# Patient Record
Sex: Male | Born: 1945 | Race: White | Hispanic: No | Smoking: Never smoker
Health system: Southern US, Community
[De-identification: ages and names within clinical notes are randomized; demographics above are authoritative.]

## PROBLEM LIST (undated history)

## (undated) DIAGNOSIS — I219 Acute myocardial infarction, unspecified: Secondary | ICD-10-CM

## (undated) DIAGNOSIS — M75102 Unspecified rotator cuff tear or rupture of left shoulder, not specified as traumatic: Secondary | ICD-10-CM

## (undated) DIAGNOSIS — I8393 Asymptomatic varicose veins of bilateral lower extremities: Secondary | ICD-10-CM

## (undated) DIAGNOSIS — R42 Dizziness and giddiness: Secondary | ICD-10-CM

## (undated) DIAGNOSIS — I639 Cerebral infarction, unspecified: Secondary | ICD-10-CM

## (undated) DIAGNOSIS — E669 Obesity, unspecified: Secondary | ICD-10-CM

## (undated) DIAGNOSIS — I10 Essential (primary) hypertension: Secondary | ICD-10-CM

## (undated) DIAGNOSIS — I209 Angina pectoris, unspecified: Secondary | ICD-10-CM

## (undated) DIAGNOSIS — I5022 Chronic systolic (congestive) heart failure: Secondary | ICD-10-CM

## (undated) DIAGNOSIS — I82409 Acute embolism and thrombosis of unspecified deep veins of unspecified lower extremity: Secondary | ICD-10-CM

## (undated) DIAGNOSIS — I255 Ischemic cardiomyopathy: Secondary | ICD-10-CM

## (undated) DIAGNOSIS — I5020 Unspecified systolic (congestive) heart failure: Secondary | ICD-10-CM

## (undated) DIAGNOSIS — I2510 Atherosclerotic heart disease of native coronary artery without angina pectoris: Secondary | ICD-10-CM

## (undated) DIAGNOSIS — C801 Malignant (primary) neoplasm, unspecified: Secondary | ICD-10-CM

## (undated) DIAGNOSIS — R609 Edema, unspecified: Secondary | ICD-10-CM

## (undated) DIAGNOSIS — N400 Enlarged prostate without lower urinary tract symptoms: Secondary | ICD-10-CM

## (undated) DIAGNOSIS — R001 Bradycardia, unspecified: Secondary | ICD-10-CM

## (undated) DIAGNOSIS — C44301 Unspecified malignant neoplasm of skin of nose: Secondary | ICD-10-CM

## (undated) DIAGNOSIS — M51369 Other intervertebral disc degeneration, lumbar region without mention of lumbar back pain or lower extremity pain: Secondary | ICD-10-CM

## (undated) DIAGNOSIS — M1990 Unspecified osteoarthritis, unspecified site: Secondary | ICD-10-CM

## (undated) DIAGNOSIS — I700 Atherosclerosis of aorta: Secondary | ICD-10-CM

## (undated) DIAGNOSIS — I6789 Other cerebrovascular disease: Secondary | ICD-10-CM

## (undated) DIAGNOSIS — K219 Gastro-esophageal reflux disease without esophagitis: Secondary | ICD-10-CM

## (undated) DIAGNOSIS — I679 Cerebrovascular disease, unspecified: Secondary | ICD-10-CM

## (undated) DIAGNOSIS — U071 COVID-19: Secondary | ICD-10-CM

## (undated) DIAGNOSIS — Z7902 Long term (current) use of antithrombotics/antiplatelets: Secondary | ICD-10-CM

## (undated) DIAGNOSIS — I739 Peripheral vascular disease, unspecified: Secondary | ICD-10-CM

## (undated) DIAGNOSIS — R112 Nausea with vomiting, unspecified: Secondary | ICD-10-CM

## (undated) DIAGNOSIS — E785 Hyperlipidemia, unspecified: Secondary | ICD-10-CM

## (undated) HISTORY — DX: Ischemic cardiomyopathy: I25.5

## (undated) HISTORY — DX: Cerebrovascular disease, unspecified: I67.9

## (undated) HISTORY — PX: KNEE SURGERY: SHX244

## (undated) HISTORY — DX: Unspecified systolic (congestive) heart failure: I50.20

## (undated) HISTORY — DX: Chronic systolic (congestive) heart failure: I50.22

## (undated) HISTORY — PX: COLONOSCOPY WITH ESOPHAGOGASTRODUODENOSCOPY (EGD): SHX5779

## (undated) HISTORY — PX: TOTAL HIP ARTHROPLASTY: SHX124

## (undated) HISTORY — PX: SHOULDER ACROMIOPLASTY: SHX6093

## (undated) HISTORY — DX: Dizziness and giddiness: R42

---

## 2004-12-15 ENCOUNTER — Ambulatory Visit: Payer: Self-pay | Admitting: Unknown Physician Specialty

## 2005-10-21 ENCOUNTER — Emergency Department: Payer: Self-pay | Admitting: Emergency Medicine

## 2008-03-30 ENCOUNTER — Emergency Department: Payer: Self-pay | Admitting: Emergency Medicine

## 2008-08-20 ENCOUNTER — Ambulatory Visit: Payer: Self-pay | Admitting: Unknown Physician Specialty

## 2008-10-18 ENCOUNTER — Ambulatory Visit: Payer: Self-pay | Admitting: Sports Medicine

## 2013-10-29 HISTORY — PX: CARDIAC CATHETERIZATION: SHX172

## 2014-08-02 ENCOUNTER — Inpatient Hospital Stay: Payer: Self-pay | Admitting: Internal Medicine

## 2014-08-02 HISTORY — PX: CORONARY ANGIOPLASTY WITH STENT PLACEMENT: SHX49

## 2014-08-02 LAB — COMPREHENSIVE METABOLIC PANEL
Albumin: 4.2 g/dL (ref 3.4–5.0)
Alkaline Phosphatase: 101 U/L
Anion Gap: 6 — ABNORMAL LOW (ref 7–16)
BUN: 18 mg/dL (ref 7–18)
Bilirubin,Total: 1.3 mg/dL — ABNORMAL HIGH (ref 0.2–1.0)
CALCIUM: 9.1 mg/dL (ref 8.5–10.1)
CHLORIDE: 106 mmol/L (ref 98–107)
CO2: 28 mmol/L (ref 21–32)
CREATININE: 1.28 mg/dL (ref 0.60–1.30)
EGFR (African American): >60
EGFR (Non-African Amer.): 60 — ABNORMAL LOW
Glucose: 125 mg/dL — ABNORMAL HIGH (ref 65–99)
Osmolality: 283 (ref 275–301)
POTASSIUM: 3.8 mmol/L (ref 3.5–5.1)
SGOT(AST): 26 U/L (ref 15–37)
SGPT (ALT): 30 U/L
SODIUM: 140 mmol/L (ref 136–145)
TOTAL PROTEIN: 7.9 g/dL (ref 6.4–8.2)

## 2014-08-02 LAB — CBC WITH DIFFERENTIAL/PLATELET
BASOS PCT: 0.3 %
Basophil #: 0 10*3/uL (ref 0.0–0.1)
Eosinophil #: 0.1 10*3/uL (ref 0.0–0.7)
Eosinophil %: 0.7 %
HCT: 52.2 % — ABNORMAL HIGH (ref 40.0–52.0)
HGB: 17.2 g/dL (ref 13.0–18.0)
LYMPHS ABS: 4 10*3/uL — AB (ref 1.0–3.6)
Lymphocyte %: 43.1 %
MCH: 32.4 pg (ref 26.0–34.0)
MCHC: 32.9 g/dL (ref 32.0–36.0)
MCV: 99 fL (ref 80–100)
MONO ABS: 0.5 x10 3/mm (ref 0.2–1.0)
Monocyte %: 4.8 %
Neutrophil #: 4.8 10*3/uL (ref 1.4–6.5)
Neutrophil %: 51.1 %
Platelet: 168 10*3/uL (ref 150–440)
RBC: 5.3 10*6/uL (ref 4.40–5.90)
RDW: 13.5 % (ref 11.5–14.5)
WBC: 9.4 10*3/uL (ref 3.8–10.6)

## 2014-08-02 LAB — TROPONIN I
TROPONIN-I: 0.09 ng/mL — AB
TROPONIN-I: 9.6 ng/mL — AB
Troponin-I: 19 ng/mL — ABNORMAL HIGH

## 2014-08-02 LAB — LIPID PANEL
CHOLESTEROL: 237 mg/dL — AB (ref 0–200)
HDL Cholesterol: 56 mg/dL (ref 40–60)
Ldl Cholesterol, Calc: 142 mg/dL — ABNORMAL HIGH (ref 0–100)
TRIGLYCERIDES: 197 mg/dL (ref 0–200)
VLDL Cholesterol, Calc: 39 mg/dL (ref 5–40)

## 2014-08-02 LAB — CK-MB
CK-MB: 39.2 ng/mL — ABNORMAL HIGH (ref 0.5–3.6)
CK-MB: 42.1 ng/mL — ABNORMAL HIGH (ref 0.5–3.6)

## 2014-08-02 LAB — HEMOGLOBIN A1C: Hemoglobin A1C: 5.4 % (ref 4.2–6.3)

## 2014-08-02 LAB — APTT: ACTIVATED PTT: 28.5 s (ref 23.6–35.9)

## 2014-08-02 LAB — PROTIME-INR
INR: 0.9
Prothrombin Time: 12.2 secs (ref 11.5–14.7)

## 2014-08-02 LAB — CK TOTAL AND CKMB (NOT AT ARMC)
CK, TOTAL: 377 U/L — AB
CK, TOTAL: 476 U/L — AB
CK-MB: 37.8 ng/mL — AB (ref 0.5–3.6)
CK-MB: 48.9 ng/mL — AB (ref 0.5–3.6)

## 2014-08-03 LAB — BASIC METABOLIC PANEL
ANION GAP: 7 (ref 7–16)
BUN: 13 mg/dL (ref 7–18)
CALCIUM: 8.3 mg/dL — AB (ref 8.5–10.1)
CO2: 24 mmol/L (ref 21–32)
Chloride: 111 mmol/L — ABNORMAL HIGH (ref 98–107)
Creatinine: 0.87 mg/dL (ref 0.60–1.30)
EGFR (Non-African Amer.): >60
GLUCOSE: 99 mg/dL (ref 65–99)
Osmolality: 283 (ref 275–301)
Potassium: 3.4 mmol/L — ABNORMAL LOW (ref 3.5–5.1)
SODIUM: 142 mmol/L (ref 136–145)

## 2014-08-03 LAB — TROPONIN I: TROPONIN-I: 8.6 ng/mL — AB

## 2014-08-03 LAB — CK: CK, TOTAL: 298 U/L

## 2014-08-10 DIAGNOSIS — I2119 ST elevation (STEMI) myocardial infarction involving other coronary artery of inferior wall: Secondary | ICD-10-CM | POA: Insufficient documentation

## 2014-08-12 DIAGNOSIS — I2119 ST elevation (STEMI) myocardial infarction involving other coronary artery of inferior wall: Secondary | ICD-10-CM

## 2014-08-12 HISTORY — DX: ST elevation (STEMI) myocardial infarction involving other coronary artery of inferior wall: I21.19

## 2015-02-19 NOTE — Consult Note (Signed)
Chief Complaint:  Subjective/Chief Complaint With patient states to feel reasonably well denies any chest pain no shortness of breath ambulating the halls well without any difficulty. patient went left ago home he says.   VITAL SIGNS/ANCILLARY NOTES: **Vital Signs.:   06-Oct-15 09:32  Vital Signs Type Admission  Temperature Temperature (F) 98.7  Celsius 37  Pulse Pulse 71  Respirations Respirations 18  Systolic BP Systolic BP 299  Diastolic BP (mmHg) Diastolic BP (mmHg) 95  Mean BP 111  Pulse Ox % Pulse Ox % 98  Pulse Ox Activity Level  At rest  Oxygen Delivery Room Air/ 21 %  *Intake and Output.:   06-Oct-15 08:30  Grand Totals Intake:  480 Output:      Net:  480 24 Hr.:  480  Oral Intake      In:  480  Percentage of Meal Eaten  100   Brief Assessment:  GEN well developed, well nourished, no acute distress   Cardiac Regular  murmur present   Respiratory normal resp effort  clear BS   Gastrointestinal Normal   Gastrointestinal details normal Soft  Nontender  Nondistended  No masses palpable   EXTR negative cyanosis/clubbing, negative edema   Lab Results: Routine Chem:  06-Oct-15 04:18   Glucose, Serum 99  BUN 13  Creatinine (comp) 0.87  Sodium, Serum 142  Potassium, Serum  3.4  Chloride, Serum  111  CO2, Serum 24  Calcium (Total), Serum  8.3  Anion Gap 7  Osmolality (calc) 283  eGFR (African American) >60  eGFR (Non-African American) >60 (eGFR values <66m/min/1.73 m2 may be an indication of chronic kidney disease (CKD). Calculated eGFR, using the MRDR Study equation, is useful in  patients with stable renal function. The eGFR calculation will not be reliable in acutely ill patients when serum creatinine is changing rapidly. It is not useful in patients on dialysis. The eGFR calculation may not be applicable to patients at the low and high extremes of body sizes, pregnant women, and vetetarians.)   Radiology Results: XRay:    05-Oct-15 11:20, Chest  Portable Single View  Chest Portable Single View   REASON FOR EXAM:    Chest Pain  COMMENTS:       PROCEDURE: DXR - DXR PORTABLE CHEST SINGLE VIEW  - Aug 02 2014 11:20AM     CLINICAL DATA:  Midsternal chest pain starting earlier in the day,  sharp in character    EXAM:  PORTABLE CHEST - 1 VIEW    COMPARISON:  None.    FINDINGS:  The lungs are clear. Heart size and pulmonary vascularity are  normal. No adenopathy. No pneumothorax. There is degenerative change  in the thoracic spine in both shoulders.     IMPRESSION:  No edema or consolidation.      Electronically Signed    By: WLowella GripM.D.    On: 08/02/2014 11:24         Verified By: WLeafy Kindle WJasmine December M.D.,  Cardiology:    05-Oct-15 10:43, ECG  Ventricular Rate 65  Atrial Rate 65  P-R Interval 236  QRS Duration 86  QT 418  QTc 434  P Axis 14  R Axis 19  T Axis 98  ECG interpretation   Sinus rhythm with 1st degree A-V block  Septal infarct (cited on or before 02-Aug-2014)  Inferior injury pattern  Statement Not Found (#821)  Consider right ventricular involvement in acute inferior infarct  Abnormal ECG  When compared with ECG of  02-Aug-2014 10:42,  No significant change was found  ----------unconfirmed----------  Confirmed by OVERREAD, NOT (100), editor PEARSON, BARBARA ([id number]) on 08/03/2014 12:06:46 PM  ECG     05-Oct-15 12:45, ECG  Ventricular Rate 59  Atrial Rate 59  P-R Interval 236  QRS Duration 92  QT 466  QTc 461  P Axis 42  R Axis 28  T Axis 17  ECG interpretation   Sinus bradycardia with 1st degree A-V block  Otherwise normal ECG  No previous ECGs available  ----------unconfirmed----------  Confirmed by OVERREAD, NOT (100), editor PEARSON, BARBARA (60) on 08/02/2014 12:51:47 PM  ECG    Assessment/Plan:  Assessment/Plan:  Assessment IMP  status post STEMI  status post PCI and stent DES  acute myocardial infarction  hyperlipidemic   borderline hypertension  mild obesity .    Plan PLAN  recommend aspirin 325 once a day  Plavix 75 mg once a day for at least a year  Lipitor 80 mg once a day post MI hyperlipidemia  metoprolol 25 mg once a day  lisinopril 2.5-5 mg once a day  increase activity  no heavy lifting for at least a 1-2 weeks  recommend cardiac rehab  have the patient follow-up with Cardiology 1-2 weeks  probably okay to discharge home   Electronic Signatures: Lujean Amel D (MD)  (Signed 06-Oct-15 13:35)  Authored: Chief Complaint, VITAL SIGNS/ANCILLARY NOTES, Brief Assessment, Lab Results, Radiology Results, Assessment/Plan   Last Updated: 06-Oct-15 13:35 by Lujean Amel D (MD)

## 2015-02-19 NOTE — H&P (Signed)
PATIENT NAME:  Ricardo Winters, MCCARDLE MR#:  [id number] DATE OF BIRTH:  [birth year]  DATE OF ADMISSION:  08/02/2014  INDICATION: Acute inferior wall myocardial infarction and a STEMI.   PRIMARY CARE PHYSICIAN: Guadalupe Maple, M.D.   REFERRED BY: Dr. Jimmye Norman in the ER.   HISTORY OF PRESENT ILLNESS: Ricardo Winters is a 69 year old white male who states being in reasonable health, no significant medication except Advil as needed, was doing fine until yesterday when he started having acute substernal chest pain, felt like a jeep on his chest, lasted about 45 minutes to an hour, but then eventually got better. Did not have any significant sweating. He had some mild shortness of breath during the pain. No syncope or blackout spell. Denied any prior symptoms like this. The pain did not radiate, just stayed in his mid chest. He did reasonably well and the pain sort of waxed and waned over the next day or so, and by today, he started having it again off and on, and then it started again at rest, and it would not go away, so he finally came to the Emergency Room about an hour after the pain started. The patient presented to the Emergency Room by EMS. Again, his EKG showed ST elevation inferiorly with reciprocal depressions inferolaterally, and the pain was much better when he came to the Emergency Room, but he still had significant EKG changes.   REVIEW OF SYSTEMS: No blackout spells or syncope. No significant nausea or vomiting. No fever, no chills, no sweats. No weight loss or weight gain, hemoptysis, hematemesis. No bright red blood per rectum. No vision change or hearing change. Denies sputum production or cough. He has had some chest pain symptoms, some shortness of breath. Otherwise, he was reasonably asymptomatic.   PHYSICAL EXAMINATION:  VITAL SIGNS: Blood pressure was 170/90, pulse was 85, respiratory rate 16, afebrile.  HEENT: Normocephalic, atraumatic. Pupils equal and reactive to light.  NECK: Supple. No  significant JVD, bruits or adenopathy.  LUNGS: Clear to auscultation and percussion. No significant wheezing, rhonchi, or rales.  HEART: Regular rate and rhythm. Positive S4 systolic ejection murmur at the apex.  ABDOMEN: Benign.  EXTREMITIES: Within normal limits.  NEUROLOGIC: Intact.  SKIN: Normal.   FAMILY HISTORY: He says noncontributory.   SOCIAL HISTORY: He is not married. Owns a medical supply company. Denies smoking or alcohol consumption.   PAST MEDICAL HISTORY: Essentially negative.   PAST SURGICAL HISTORY: Negative.   LABORATORIES: MET-B was essentially normal. BUN 18, creatinine 1.28. LFTs were negative. Glucose 125, hemoglobin and hematocrit was totally normal. Platelet count was 168,000. Cardiac enzymes are pending.   EKG: Again, normal sinus rhythm, evidence of ST elevation inferiorly with reciprocal depressions laterally and anteriorly.   ASSESSMENT:  1. ST-elevation myocardial infarction, possibly inferior. 2. 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 3. Probable hyperlipidemia. 4. Abnormal EKG.  5. Mild obesity.   PLAN:  Agree with admit. Take to the catheterization laboratory with intention to treat interventionally if there is a significant blockage. Follow up cardiac enzymes. Follow up EKG. Place the patient in a unit. Recommend screening for lipids and put him on a statin. If, in fact, he has significant coronary artery disease, recommend weight loss, exercise. Would institute blood pressure medications, beta blocker, ACE inhibitor. Also anticoagulation including aspirin. We will probably start Plavix if stents placed. Recommend cardiac rehabilitation afterwards. Consider echocardiogram.   We will base much of what we do on the results of the cardiac catheterization,  but we will take him directly to the cardiac catheterization laboratory for further evaluation and management. We will consult the primary doctor, hospitalists  for evaluation of medical problems, as well.    ____________________________ Loran Senters. Clayborn Bigness, MD ddc:JT D: 08/02/2014 12:42:23 ET T: 08/02/2014 13:29:17 ET JOB#: [id number]  cc: Dwayne D. Clayborn Bigness, MD, <Dictator>  Yolonda Kida MD ELECTRONICALLY SIGNED 09/01/2014 10:32

## 2015-02-19 NOTE — H&P (Signed)
PATIENT NAME:  Ricardo Winters, Ricardo Winters MR#:  [id number] DATE OF BIRTH:  04-Jan-1946  DATE OF ADMISSION:  08/02/2014  PRIMARY CARE PHYSICIAN: Rusty Aus, MD - [facility]   REFERRING PHYSICIAN: Dwayne D. Clayborn Bigness, MD - Cardiology   CHIEF COMPLAINT: Chest pain.   HISTORY OF PRESENT ILLNESS: A 69 year old male, who follows with Dr. Emily Filbert regularly for the last 6-7 years, every year for a physical checkup; he does not have any medical issues and does not take any prescription medications. 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 So he went in the house, took some rest, drank some soda, and after almost 30 minutes the pain was gone. He was also feeling somewhat short of breath with the pain. After that, yesterday the whole day, he continued doing his routine day-to-day activities, but did not have any pain. Then today morning, when he came out to walk his dogs, he started having pain again, and so decided to come to the Emergency Room. In the ER, he was noted to be having ST elevation MI, so urgent cardiology services were called in and he was taken for cardiac catheterization.  As per Dr. Clayborn Bigness, RCA blockage was found and a stent was placed in. He was placed in the critical care unit for a post cardiac catheterization management. Medical consult was called in to manage his medical issues and take him over.  REVIEW OF SYSTEMS:  CONSTITUTIONAL: Negative for fever, fatigue, weakness, pain or weight loss.  EYES: No blurring, double vision, discharge or redness.  EARS, NOSE, THROAT: No tinnitus, ear pain or hearing loss.  RESPIRATORY: No cough, wheezing, hemoptysis or shortness of breath.  CARDIOVASCULAR: No chest pain, orthopnea, edema, arrhythmia, palpitations.  GASTROINTESTINAL: No nausea, vomiting, diarrhea,  abdominal pain.  GENITOURINARY: No dysuria, hematuria, or increased frequency.  ENDOCRINE: No heat or cold intolerance. No episodes of sweating.  SKIN: No acne, rashes, or lesions.  MUSCULOSKELETAL: No pain or swelling in the joints.  NEUROLOGICAL: No numbness, weakness, tremor, or vertigo.  PSYCHIATRIC: No anxiety, insomnia, bipolar disorder.   PAST MEDICAL HISTORY: None.   PAST SURGICAL HISTORY: None.   SOCIAL HISTORY: Denies smoking. He drinks  a glass of wine almost 2-3 times a week, but is not a heavy drinker. Denies illegal drug use. 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   FAMILY HISTORY: Positive for having myocardial infarction in his father in his 109s, and had CABG.   HOME MEDICATIONS: Aleve for pain as needed, rarely. No prescription medications.   PHYSICAL EXAMINATION:  VITAL SIGNS: Temperature 97.9, pulse is 59, respirations 14, blood pressure is 144/86. Pulse oximetry is 99% on 2 L oxygen.  GENERAL: On physical examination, the patient is fully alert and oriented to time, place, and person. Does not appear to be in any acute distress.  HEAD AND NECK: Atraumatic. Conjunctivae pink. Oral mucosa moist. Neck supple. No JVD.  RESPIRATORY: Bilateral equal and clear air entry.  CARDIOVASCULAR: S1, S2 present. Regular. No murmur.  ABDOMEN: Soft, nontender. Bowel sounds present. No organomegaly.  SKIN: No rashes.  LEGS: No edema.  NEUROLOGICAL: Power 5/5. Follows commands. Moves all 4 limbs. No gross abnormality.  PSYCHIATRIC: Does not appear in any acute psychiatric illness  at this time.   IMPORTANT LABORATORY RESULTS: Glucose 125, BUN 18, creatinine 1.28, sodium 140, potassium is 3.8, chloride is 106, CO2 is 28. LDL is 142, VLDL is 39, triglyceride 197, cholesterol 237, HDL is 56. Hemoglobin A1c is 5.4. Total protein 7.9, albumin 4.2, bilirubin 1.3, alkaline phosphate 101, SGOT 26, SGPT 30. Troponin is 0.09. WBC  9.4, hemoglobin is 17.2, platelet count is 168,000, MCV is 99. INR is 0.9 and prothrombin time 12.2.   His chest x-ray portable, shows no edema or consolidation.   ASSESSMENT AND PLAN: A 69 year old male with no past medical history. Follows with his primary care doctor regularly and does not have any issues, having myocardial infarction in his family in 19s, came with chest pain and found to have an ST-elevation myocardial infarction, status post cardiac catheterization.  1.  ST elevation myocardial infarction. As mentioned, above cardiac catheterization was done by Dr. Clayborn Bigness and stent was placed in the right coronary artery. Further immediate management per cardiology team. He is already started on aspirin and Plavix, statin, beta blocker. I explained to the patient about the need of medications regularly now onwards. He understand that and agreed to continue taking the medications, and to follow with cardiology. Will follow further cardiology recommendations about this.  2.  Hyperlipidemia: Checked his lipid panel and LDH is high, now with coronary artery disease we have to keep it under better control, so started on statins.  3.  Checked the HbA1c, which is normal, so does not require any further intervention for his slight hyperglycemia.  CODE STATUS: Full Code.   TOTAL TIME SPENT ON THIS ADMISSION: 50 minutes     ____________________________ Ceasar Lund Anselm Jungling, MD vgv:MT D: 08/02/2014 13:24:55 ET T: 08/02/2014 13:48:26 ET JOB#: [id number]  cc: Ceasar Lund. Anselm Jungling, MD, <Dictator> Rusty Aus, MD Dwayne D. Clayborn Bigness, MD Vaughan Basta MD ELECTRONICALLY SIGNED 08/02/2014 23:76

## 2015-02-19 NOTE — Discharge Summary (Signed)
PATIENT NAME:  YEIDEN, FRENKEL MR#:  [id number] DATE OF BIRTH:  1946/03/22  DATE OF ADMISSION:  08/02/2014 DATE OF DISCHARGE:  08/03/2014  DISCHARGE DIAGNOSES: 1.  Acute inferior myocardial infarction, ST elevation myocardial infarction.  2.  Hyperlipidemia.  3.  Coronary artery disease.   DISCHARGE MEDICATIONS: Lisinopril 10 mg daily, Nitrostat 0.4 mg sublingual p.r.n. chest pain, atorvastatin 80 mg at bedtime, aspirin 325 mg daily, Plavix 75 mg daily, Toprol-XL 50 mg daily.   REASON FOR ADMISSION: A 69 year old male presents with acute inferior MI. Please see H and P for HPI, past medical history, and physical exam.   HOSPITAL COURSE: The patient was admitted with acute ST elevation inferior MI. He went immediately to the heart catheterization lab where he was found to have a tight RCA lesion which was stented. His LV systolic function was preserved. His peak CK was 476. Post stent he had no more symptoms. He was totally pain free, ambulating without difficulty, and was discharged home on the above medications for aggressive followup. He does opt for heart track. Overall prognosis is good.     ____________________________ Rusty Aus, MD mfm:at D: 08/04/2014 08:06:03 ET T: 08/04/2014 09:19:07 ET JOB#: [id number]  cc: Rusty Aus, MD, <Dictator> Rusty Aus MD ELECTRONICALLY SIGNED 08/05/2014 8:07

## 2015-12-09 ENCOUNTER — Encounter: Payer: Self-pay | Admitting: *Deleted

## 2015-12-12 ENCOUNTER — Encounter
Admission: RE | Disposition: A | Discharge status: Home / Self Care | Payer: Self-pay | Source: Ambulatory Visit | Attending: Unknown Physician Specialty

## 2015-12-12 ENCOUNTER — Ambulatory Visit: Payer: Medicare Other | Admitting: Anesthesiology

## 2015-12-12 ENCOUNTER — Ambulatory Visit
Admission: RE | Admit: 2015-12-12 | Discharge: 2015-12-12 | Disposition: A | Discharge status: Home / Self Care | Payer: Medicare Other | Source: Ambulatory Visit | Attending: Unknown Physician Specialty | Admitting: Unknown Physician Specialty

## 2015-12-12 ENCOUNTER — Encounter: Payer: Self-pay | Admitting: *Deleted

## 2015-12-12 DIAGNOSIS — I252 Old myocardial infarction: Secondary | ICD-10-CM | POA: Insufficient documentation

## 2015-12-12 DIAGNOSIS — K640 First degree hemorrhoids: Secondary | ICD-10-CM | POA: Insufficient documentation

## 2015-12-12 DIAGNOSIS — Z79899 Other long term (current) drug therapy: Secondary | ICD-10-CM | POA: Diagnosis not present

## 2015-12-12 DIAGNOSIS — Z955 Presence of coronary angioplasty implant and graft: Secondary | ICD-10-CM | POA: Diagnosis not present

## 2015-12-12 DIAGNOSIS — I2510 Atherosclerotic heart disease of native coronary artery without angina pectoris: Secondary | ICD-10-CM | POA: Insufficient documentation

## 2015-12-12 DIAGNOSIS — Z85828 Personal history of other malignant neoplasm of skin: Secondary | ICD-10-CM | POA: Insufficient documentation

## 2015-12-12 DIAGNOSIS — I10 Essential (primary) hypertension: Secondary | ICD-10-CM | POA: Insufficient documentation

## 2015-12-12 DIAGNOSIS — K5730 Diverticulosis of large intestine without perforation or abscess without bleeding: Secondary | ICD-10-CM | POA: Insufficient documentation

## 2015-12-12 DIAGNOSIS — Z1211 Encounter for screening for malignant neoplasm of colon: Secondary | ICD-10-CM | POA: Diagnosis not present

## 2015-12-12 DIAGNOSIS — Z86010 Personal history of colonic polyps: Secondary | ICD-10-CM | POA: Insufficient documentation

## 2015-12-12 DIAGNOSIS — Z7902 Long term (current) use of antithrombotics/antiplatelets: Secondary | ICD-10-CM | POA: Diagnosis not present

## 2015-12-12 DIAGNOSIS — Z7982 Long term (current) use of aspirin: Secondary | ICD-10-CM | POA: Diagnosis not present

## 2015-12-12 HISTORY — DX: Edema, unspecified: R60.9

## 2015-12-12 HISTORY — DX: Obesity, unspecified: E66.9

## 2015-12-12 HISTORY — DX: Angina pectoris, unspecified: I20.9

## 2015-12-12 HISTORY — DX: Atherosclerotic heart disease of native coronary artery without angina pectoris: I25.10

## 2015-12-12 HISTORY — DX: Acute myocardial infarction, unspecified: I21.9

## 2015-12-12 HISTORY — DX: Essential (primary) hypertension: I10

## 2015-12-12 HISTORY — PX: COLONOSCOPY WITH PROPOFOL: SHX5780

## 2015-12-12 SURGERY — COLONOSCOPY WITH PROPOFOL
Anesthesia: General

## 2015-12-12 MED ORDER — SODIUM CHLORIDE 0.9 % IV SOLN
INTRAVENOUS | Status: DC
  Administered 2015-12-12: 15:00:00 via INTRAVENOUS

## 2015-12-12 MED ORDER — FENTANYL CITRATE (PF) 100 MCG/2ML IJ SOLN
INTRAMUSCULAR | Status: DC | PRN
  Administered 2015-12-12: 50 ug via INTRAVENOUS

## 2015-12-12 MED ORDER — PROPOFOL 10 MG/ML IV BOLUS
INTRAVENOUS | Status: DC | PRN
  Administered 2015-12-12: 30 mg via INTRAVENOUS

## 2015-12-12 MED ORDER — LIDOCAINE HCL (PF) 2 % IJ SOLN
INTRAMUSCULAR | Status: DC | PRN
  Administered 2015-12-12: 60 mg

## 2015-12-12 MED ORDER — PROPOFOL 500 MG/50ML IV EMUL
INTRAVENOUS | Status: DC | PRN
  Administered 2015-12-12: 100 ug/kg/min via INTRAVENOUS

## 2015-12-12 MED ORDER — MIDAZOLAM HCL 5 MG/5ML IJ SOLN
INTRAMUSCULAR | Status: DC | PRN
  Administered 2015-12-12: 1 mg via INTRAVENOUS

## 2015-12-12 NOTE — Transfer of Care (Signed)
Immediate Anesthesia Transfer of Care Note  Patient: Ricardo Winters.  Procedure(s) Performed: Procedure(s): COLONOSCOPY WITH PROPOFOL (N/A)  Patient Location: PACU  Anesthesia Type:General  Level of Consciousness: sedated  Airway & Oxygen Therapy: Patient Spontanous Breathing and Patient connected to nasal cannula oxygen  Post-op Assessment: Report given to RN and Post -op Vital signs reviewed and stable  Post vital signs: Reviewed and stable  Last Vitals:  Filed Vitals:   12/12/15 1428 12/12/15 1556  BP: 167/94 126/71  Pulse: 55 66  Temp: 36.3 C 35.8 C  Resp: 18 21    Complications: No apparent anesthesia complications

## 2015-12-12 NOTE — Op Note (Signed)
[facility] Patient Name: Ricardo Winters Procedure Date: 12/12/2015 3:10 PM MRN: [id number] Account #: [id number] Date of Birth: 05-Dec-1945 Admit Type: Outpatient Age: 70 Room: [facility] ENDO ROOM 1 Gender: Male Note Status: Finalized Procedure:         Colonoscopy Indications:       Colon cancer screening in patient at increased risk:                     Family history of 1st-degree relative with colon polyps Providers:         [name], MD Referring MD:      Rusty Aus, MD (Referring MD) Medicines:         Propofol per Anesthesia Complications:     No immediate complications. Procedure:         Pre-Anesthesia Assessment:                    - After reviewing the risks and benefits, the patient was                     deemed in satisfactory condition to undergo the procedure.                    After obtaining informed consent, the colonoscope was                     passed under direct vision. Throughout the procedure, the                     patient's blood pressure, pulse, and oxygen saturations                     were monitored continuously. The Colonoscope was                     introduced through the anus and advanced to the the cecum,                     identified by appendiceal orifice and ileocecal valve. The                     colonoscopy was somewhat difficult due to significant                     looping. Successful completion of the procedure was aided                     by applying abdominal pressure. The patient tolerated the                     procedure well. The quality of the bowel preparation was                     excellent. Findings:      Internal hemorrhoids were found during endoscopy. The hemorrhoids were       small and Grade I (internal hemorrhoids that do not prolapse).      Multiple small and large-mouthed diverticula were found in the sigmoid       colon, descending colon, transverse colon and hepatic  flexure.      The exam was otherwise without abnormality. Impression:        - Internal hemorrhoids.                    -  Diverticulosis in the sigmoid colon, in the descending                     colon, in the transverse colon and at the hepatic flexure.                    - The examination was otherwise normal.                    - No specimens collected. Recommendation:    - Repeat colonoscopy in 5 years for surveillance and for                     adenoma surveillance. [name], MD 12/12/2015 4:42:24 PM This report has been signed electronically. Number of Addenda: 0 Note Initiated On: 12/12/2015 3:10 PM Scope Withdrawal Time: 0 hours 11 minutes 20 seconds  Total Procedure Duration: 0 hours 24 minutes 26 seconds       [facility]

## 2015-12-12 NOTE — Anesthesia Preprocedure Evaluation (Signed)
Anesthesia Evaluation  Patient identified by MRN, date of birth, ID band Patient awake    Reviewed: Allergy & Precautions, NPO status , Patient's Chart, lab work & pertinent test results  Airway Mallampati: II  TM Distance: >3 FB     Dental  (+) Chipped   Pulmonary neg pulmonary ROS,    Pulmonary exam normal        Cardiovascular hypertension, Pt. on medications + angina with exertion + CAD and + Past MI  Normal cardiovascular exam     Neuro/Psych negative neurological ROS  negative psych ROS   GI/Hepatic negative GI ROS, Neg liver ROS,   Endo/Other  negative endocrine ROS  Renal/GU negative Renal ROS  negative genitourinary   Musculoskeletal negative musculoskeletal ROS (+)   Abdominal Normal abdominal exam  (+)   Peds negative pediatric ROS (+)  Hematology negative hematology ROS (+)   Anesthesia Other Findings   Reproductive/Obstetrics                             Anesthesia Physical Anesthesia Plan  ASA: III  Anesthesia Plan: General   Post-op Pain Management:    Induction: Intravenous  Airway Management Planned: Nasal Cannula  Additional Equipment:   Intra-op Plan:   Post-operative Plan:   Informed Consent: I have reviewed the patients History and Physical, chart, labs and discussed the procedure including the risks, benefits and alternatives for the proposed anesthesia with the patient or authorized representative who has indicated his/her understanding and acceptance.   Dental advisory given  Plan Discussed with: CRNA and Surgeon  Anesthesia Plan Comments:         Anesthesia Quick Evaluation

## 2015-12-14 ENCOUNTER — Encounter: Payer: Self-pay | Admitting: Unknown Physician Specialty

## 2015-12-14 NOTE — Anesthesia Postprocedure Evaluation (Signed)
Anesthesia Post Note  Patient: Ricardo Winters.  Procedure(s) Performed: Procedure(s) (LRB): COLONOSCOPY WITH PROPOFOL (N/A)  Patient location during evaluation: PACU Anesthesia Type: General Level of consciousness: awake and alert and oriented Pain management: pain level controlled Vital Signs Assessment: post-procedure vital signs reviewed and stable Respiratory status: spontaneous breathing Cardiovascular status: blood pressure returned to baseline Anesthetic complications: no    Last Vitals:  Filed Vitals:   12/12/15 1616 12/12/15 1626  BP: 144/82 151/93  Pulse: 52 50  Temp:    Resp: 12 15    Last Pain: There were no vitals filed for this visit.               Tammala Weider

## 2016-04-19 DIAGNOSIS — M1612 Unilateral primary osteoarthritis, left hip: Secondary | ICD-10-CM | POA: Insufficient documentation

## 2016-07-30 DIAGNOSIS — Z86718 Personal history of other venous thrombosis and embolism: Secondary | ICD-10-CM | POA: Insufficient documentation

## 2016-08-29 DIAGNOSIS — Z96641 Presence of right artificial hip joint: Secondary | ICD-10-CM | POA: Insufficient documentation

## 2016-11-16 DIAGNOSIS — D369 Benign neoplasm, unspecified site: Secondary | ICD-10-CM | POA: Insufficient documentation

## 2017-03-18 ENCOUNTER — Emergency Department: Payer: Medicare HMO

## 2017-03-18 ENCOUNTER — Emergency Department
Admission: EM | Admit: 2017-03-18 | Discharge: 2017-03-19 | Disposition: A | Discharge status: Home / Self Care | Payer: Medicare HMO | Attending: Emergency Medicine | Admitting: Emergency Medicine

## 2017-03-18 DIAGNOSIS — Z79899 Other long term (current) drug therapy: Secondary | ICD-10-CM | POA: Diagnosis not present

## 2017-03-18 DIAGNOSIS — Z7982 Long term (current) use of aspirin: Secondary | ICD-10-CM | POA: Insufficient documentation

## 2017-03-18 DIAGNOSIS — R42 Dizziness and giddiness: Secondary | ICD-10-CM | POA: Insufficient documentation

## 2017-03-18 DIAGNOSIS — Z85828 Personal history of other malignant neoplasm of skin: Secondary | ICD-10-CM | POA: Diagnosis not present

## 2017-03-18 DIAGNOSIS — I10 Essential (primary) hypertension: Secondary | ICD-10-CM | POA: Insufficient documentation

## 2017-03-18 DIAGNOSIS — R079 Chest pain, unspecified: Secondary | ICD-10-CM | POA: Diagnosis present

## 2017-03-18 DIAGNOSIS — I2510 Atherosclerotic heart disease of native coronary artery without angina pectoris: Secondary | ICD-10-CM | POA: Diagnosis not present

## 2017-03-18 DIAGNOSIS — I252 Old myocardial infarction: Secondary | ICD-10-CM | POA: Diagnosis not present

## 2017-03-18 DIAGNOSIS — Z7902 Long term (current) use of antithrombotics/antiplatelets: Secondary | ICD-10-CM | POA: Diagnosis not present

## 2017-03-18 HISTORY — DX: Malignant (primary) neoplasm, unspecified: C80.1

## 2017-03-18 LAB — CBC
HEMATOCRIT: 41.4 % (ref 40.0–52.0)
Hemoglobin: 14.3 g/dL (ref 13.0–18.0)
MCH: 33.5 pg (ref 26.0–34.0)
MCHC: 34.6 g/dL (ref 32.0–36.0)
MCV: 97 fL (ref 80.0–100.0)
Platelets: 159 10*3/uL (ref 150–440)
RBC: 4.27 MIL/uL — ABNORMAL LOW (ref 4.40–5.90)
RDW: 14.1 % (ref 11.5–14.5)
WBC: 7.1 10*3/uL (ref 3.8–10.6)

## 2017-03-18 LAB — BASIC METABOLIC PANEL
Anion gap: 6 (ref 5–15)
BUN: 22 mg/dL — AB (ref 6–20)
CO2: 28 mmol/L (ref 22–32)
CREATININE: 1.25 mg/dL — AB (ref 0.61–1.24)
Calcium: 9.6 mg/dL (ref 8.9–10.3)
Chloride: 106 mmol/L (ref 101–111)
GFR calc Af Amer: >60 mL/min (ref >60)
GFR calc non Af Amer: 57 mL/min — ABNORMAL LOW (ref >60)
GLUCOSE: 110 mg/dL — AB (ref 65–99)
POTASSIUM: 3.7 mmol/L (ref 3.5–5.1)
Sodium: 140 mmol/L (ref 135–145)

## 2017-03-18 LAB — TROPONIN I: Troponin I: <0.03 ng/mL (ref <0.03)

## 2017-03-18 MED ORDER — SODIUM CHLORIDE 0.9 % IV BOLUS (SEPSIS)
1000.0000 mL | Freq: Once | INTRAVENOUS | Status: AC
  Administered 2017-03-19: 1000 mL via INTRAVENOUS

## 2017-03-18 NOTE — ED Provider Notes (Signed)
[facility] Emergency Department Provider Note  ____________________________________________   First MD Initiated Contact with Patient 03/18/17 2252     (approximate)  I have reviewed the triage vital signs and the nursing notes.   HISTORY  Chief Complaint Chest Pain   HPI Ricardo Winters. is a 71 y.o. male who presents to the emergency department for evaluation of chest pain and "weird" feeling. He states that he was doing some work on his property and bent over to pick something up, when he stood up he can have some pain in his chest that resolved without any intervention. Today, he had an episode of what he thought was severe indigestion. He states that he took nitroglycerin, but isn't sure if it provided him with any relief. He states he had another episode of feeling very "weird" at around 4:30. He reports the only medication change was some type of medicine that pulled some fluid off of his lower extremities that he took for about 3 days. He states that he has been urinating frequently, but has increased the amount of water he is drinking. He is currently pain free, but states that if his heart rate drops to 50 he has "that weird sensation."    Past Medical History:  Diagnosis Date  . Anginal pain ([place])   . Cancer ([place])    skin  . Coronary artery disease   . Edema   . Hypertension   . Myocardial infarction ([place])   . Obesity     There are no active problems to display for this patient.   Past Surgical History:  Procedure Laterality Date  . COLONOSCOPY WITH PROPOFOL N/A 12/12/2015   Procedure: COLONOSCOPY WITH PROPOFOL;  Surgeon: [name], MD;  Location: [facility] ENDOSCOPY;  Service: Endoscopy;  Laterality: N/A;  . SHOULDER ACROMIOPLASTY      Prior to Admission medications   Medication Sig Start Date End Date Taking? Authorizing Provider  aspirin 325 MG EC tablet Take 325 mg by mouth daily.    [provider]  atorvastatin  (LIPITOR) 20 MG tablet Take 20 mg by mouth daily.    [provider]  clopidogrel (PLAVIX) 75 MG tablet Take 75 mg by mouth daily.    [provider]  lisinopril (PRINIVIL,ZESTRIL) 10 MG tablet Take 10 mg by mouth daily.    [provider]  nitroGLYCERIN (NITROSTAT) 0.4 MG SL tablet Place 0.4 mg under the tongue every 5 (five) minutes as needed for chest pain. Reported on 12/12/2015    [provider]  sildenafil (REVATIO) 20 MG tablet Take 20 mg by mouth 3 (three) times daily.    [provider]    Allergies Patient has no known allergies.  No family history on file.  Social History Social History  Substance Use Topics  . Smoking status: Never Smoker  . Smokeless tobacco: Never Used  . Alcohol use 2.4 oz/week    4 Glasses of wine per week    Review of Systems  Constitutional: No fever/chills Eyes: No visual changes or vision loss. ENT: No sore throat or dysphagia. Cardiovascular: Denies chest pain at time of assessment. Respiratory: Denies shortness of breath. Gastrointestinal: No abdominal pain.  No nausea, no vomiting.  No diarrhea. Genitourinary: Negative for dysuria. Musculoskeletal: Negative for back pain. Skin: Negative for rash. Neurological: Negative for headaches, focal weakness or numbness. ____________________________________________   PHYSICAL EXAM:  VITAL SIGNS: ED Triage Vitals [03/18/17 1802]  Enc Vitals Group  BP (!) 149/89     Pulse Rate 60     Resp 20     Temp      Temp src      SpO2 98 %     Weight 230 lb (104.3 kg)     Height 6\' 2"  (1.88 m)     Head Circumference      Peak Flow      Pain Score 0     Pain Loc      Pain Edu?      Excl. in [place]?     Constitutional: Alert and oriented. Well appearing and in no acute distress. Eyes: Conjunctivae are normal. Head: Atraumatic. Nose: No congestion/rhinnorhea. Mouth/Throat: Mucous membranes are moist.  Neck: No stridor.   Cardiovascular:  Bradycardic, regular rhythm. Grossly normal heart sounds.  Good peripheral circulation. Respiratory: Normal respiratory effort.  No retractions. Lungs CTAB. Gastrointestinal: Soft and nontender. No distention. Bowel sounds present and normoactive x 4 quadrants. Musculoskeletal: No lower extremity tenderness nor edema.  No joint effusions. Neurologic:  Normal speech and language. No gross focal neurologic deficits are appreciated. No gait instability. Skin:  Skin is warm, dry and intact. No rash noted. Psychiatric: Mood and affect are normal. Speech and behavior are normal.  ____________________________________________   LABS (all labs ordered are listed, but only abnormal results are displayed)  Labs Reviewed  BASIC METABOLIC PANEL - Abnormal; Notable for the following:       Result Value   Glucose, Bld 110 (*)    BUN 22 (*)    Creatinine, Ser 1.25 (*)    GFR calc non Af Amer 57 (*)    All other components within normal limits  CBC - Abnormal; Notable for the following:    RBC 4.27 (*)    All other components within normal limits  URINALYSIS, COMPLETE (UACMP) WITH MICROSCOPIC - Abnormal; Notable for the following:    Color, Urine STRAW (*)    APPearance CLEAR (*)    All other components within normal limits  TROPONIN I  TROPONIN I  TSH   ____________________________________________  EKG  Sinus rhythm with a rate of 60. First degree AV block.  ____________________________________________  RADIOLOGY  Chest x-ray negative for acute cardiopulmonary abnormalities. ____________________________________________   PROCEDURES  Procedure(s) performed: None  Procedures  Critical Care performed: No  ____________________________________________   INITIAL IMPRESSION / ASSESSMENT AND PLAN / ED COURSE  Pertinent labs & imaging results that were available during my care of the patient were reviewed by me and considered in my medical decision making (see chart for  details).  71 year old male presenting to the emergency department for evaluation of chest pain and feeling imbalanced when heart rate drops to 50. He denies having had these symptoms previously. He states that due to joint pain, he intermittently has a decrease in activity level which leads to peripheral lower extremity edema. This was the case last week and he took some "fluid pills" that were prescribed by Dr. Clayborn Bigness. Edema resolved after 3 days and he stopped taking them and has been able to increase his activity level which has also resolved the peripheral edema. Although he is pain free at this time, he continues to have these odd, unstable and "almost out of body experience like feelings" just while sitting on the stretcher and correlates it with noticing that his heart rate is at 50 when this occurs.  ----------------------------------------- 12:02 AM on 03/19/2017 -----------------------------------------  Second troponin sent to lab. Fluid bolus  started after orthostatics.  ----------------------------------------- 1:16 AM on 03/19/2017 -----------------------------------------  Patient continues to feel "strange" when the heart rate drops to 50 or below. CT angiogram of the head and neck ordered. Labs including TSH and second troponin are noncontributory. Patient states that he has not had a stress test, echocardiogram, or PCI since 2015.  Clinical Course as of Mar 21 733  Tue Mar 19, 2017  0309 1. Negative CTA for large or proximal arterial branch occlusion. No high-grade or correctable stenosis. 2. Atheromatous plaque at the bilateral V4 segments with short-segment mild stenoses. Otherwise widely patent vertebrobasilar system. 3. Mild for age atheromatous plaque within the carotid siphons without stenosis.   CT Angio Head W or Wo Contrast [AW]    Clinical Course User Index [AW] Loney Hering, MD     ____________________________________________   FINAL CLINICAL  IMPRESSION(S) / ED DIAGNOSES  Final diagnoses:  Dizziness  Chest pain, unspecified type      NEW MEDICATIONS STARTED DURING THIS VISIT:  Discharge Medication List as of 03/19/2017  3:54 AM       Note:  This document was prepared using Dragon voice recognition software and may include unintentional dictation errors.    Victorino Dike, FNP 03/21/17 3154    Schuyler Amor, MD 03/22/17 [phone]

## 2017-03-18 NOTE — ED Triage Notes (Signed)
Pt presents to ED via EMS c/o chest pain illicited when bending over yesterday. TOday he states at lunch he had really severe indigestion  And jaw pain. Pt took nitro around 1 pm , and 4 30 . Pt reports 0/10 currently

## 2017-03-19 ENCOUNTER — Emergency Department: Payer: Medicare HMO

## 2017-03-19 LAB — URINALYSIS, COMPLETE (UACMP) WITH MICROSCOPIC
BILIRUBIN URINE: NEGATIVE
Bacteria, UA: NONE SEEN
Glucose, UA: NEGATIVE mg/dL
HGB URINE DIPSTICK: NEGATIVE
KETONES UR: NEGATIVE mg/dL
LEUKOCYTES UA: NEGATIVE
Nitrite: NEGATIVE
PROTEIN: NEGATIVE mg/dL
Specific Gravity, Urine: 1.009 (ref 1.005–1.030)
Squamous Epithelial / LPF: NONE SEEN
pH: 6 (ref 5.0–8.0)

## 2017-03-19 LAB — TSH: TSH: 2.477 u[IU]/mL (ref 0.350–4.500)

## 2017-03-19 LAB — TROPONIN I: Troponin I: <0.03 ng/mL (ref <0.03)

## 2017-03-19 MED ORDER — IOPAMIDOL (ISOVUE-370) INJECTION 76%
75.0000 mL | Freq: Once | INTRAVENOUS | Status: AC | PRN
  Administered 2017-03-19: 75 mL via INTRAVENOUS

## 2017-03-19 NOTE — ED Provider Notes (Signed)
-----------------------------------------   3:53 AM on 03/19/2017 -----------------------------------------   Blood pressure (!) 156/86, pulse (!) 56, resp. rate 11, height 6\' 2"  (1.88 m), weight 104.3 kg (230 lb), SpO2 99 %.  Assuming care from [facility].  In short, Ricardo Winters. is a 71 y.o. male with a chief complaint of Chest Pain .  Refer to the original H&P for additional details.  The current plan of care is to follow up the results of the CTA head and neck.   Clinical Course as of Mar 19 352  Tue Mar 19, 2017  0309 1. Negative CTA for large or proximal arterial branch occlusion. No high-grade or correctable stenosis. 2. Atheromatous plaque at the bilateral V4 segments with short-segment mild stenoses. Otherwise widely patent vertebrobasilar system. 3. Mild for age atheromatous plaque within the carotid siphons without stenosis.   CT Angio Head W or Wo Contrast [AW]    Clinical Course User Index [AW] Loney Hering, MD   The patient's CTA is unremarkable. He has some short segment mild stenosis but has a widely patent vertebrobasilar system. I discussed with the patient keeping him in the hospital overnight for further evaluation or having him follow-up with his primary care physician. The patient reports that if he does not need to stay here rather go home and follow-up with his cardiologist. The patient reports he has an appointment with Dr. Clayborn Bigness on Wednesday. The patient will be discharged to home.   Loney Hering, MD 03/19/17 [phone]

## 2017-03-19 NOTE — ED Notes (Signed)

## 2017-03-19 NOTE — Discharge Instructions (Signed)
Please follow up with Dr. Clayborn Bigness for further evaluation.

## 2017-07-08 DIAGNOSIS — G8929 Other chronic pain: Secondary | ICD-10-CM | POA: Insufficient documentation

## 2017-07-08 DIAGNOSIS — M25552 Pain in left hip: Secondary | ICD-10-CM

## 2017-11-08 DIAGNOSIS — E782 Mixed hyperlipidemia: Secondary | ICD-10-CM | POA: Insufficient documentation

## 2017-11-22 DIAGNOSIS — E538 Deficiency of other specified B group vitamins: Secondary | ICD-10-CM | POA: Insufficient documentation

## 2018-03-09 ENCOUNTER — Other Ambulatory Visit: Payer: Self-pay

## 2018-03-09 ENCOUNTER — Emergency Department: Payer: Medicare HMO

## 2018-03-09 ENCOUNTER — Observation Stay
Admission: EM | Admit: 2018-03-09 | Discharge: 2018-03-10 | Disposition: A | Discharge status: Home / Self Care | Payer: Medicare HMO | Attending: Family Medicine | Admitting: Family Medicine

## 2018-03-09 ENCOUNTER — Encounter: Payer: Self-pay | Admitting: Emergency Medicine

## 2018-03-09 DIAGNOSIS — Z955 Presence of coronary angioplasty implant and graft: Secondary | ICD-10-CM | POA: Diagnosis not present

## 2018-03-09 DIAGNOSIS — I10 Essential (primary) hypertension: Secondary | ICD-10-CM

## 2018-03-09 DIAGNOSIS — I2510 Atherosclerotic heart disease of native coronary artery without angina pectoris: Secondary | ICD-10-CM

## 2018-03-09 DIAGNOSIS — R55 Syncope and collapse: Secondary | ICD-10-CM | POA: Diagnosis not present

## 2018-03-09 DIAGNOSIS — R001 Bradycardia, unspecified: Secondary | ICD-10-CM | POA: Insufficient documentation

## 2018-03-09 DIAGNOSIS — I252 Old myocardial infarction: Secondary | ICD-10-CM | POA: Diagnosis not present

## 2018-03-09 DIAGNOSIS — I214 Non-ST elevation (NSTEMI) myocardial infarction: Secondary | ICD-10-CM

## 2018-03-09 DIAGNOSIS — R778 Other specified abnormalities of plasma proteins: Secondary | ICD-10-CM

## 2018-03-09 DIAGNOSIS — Z79899 Other long term (current) drug therapy: Secondary | ICD-10-CM | POA: Insufficient documentation

## 2018-03-09 DIAGNOSIS — E669 Obesity, unspecified: Secondary | ICD-10-CM | POA: Insufficient documentation

## 2018-03-09 DIAGNOSIS — Z6830 Body mass index (BMI) 30.0-30.9, adult: Secondary | ICD-10-CM | POA: Insufficient documentation

## 2018-03-09 DIAGNOSIS — Z85828 Personal history of other malignant neoplasm of skin: Secondary | ICD-10-CM | POA: Diagnosis not present

## 2018-03-09 DIAGNOSIS — Z7982 Long term (current) use of aspirin: Secondary | ICD-10-CM | POA: Insufficient documentation

## 2018-03-09 DIAGNOSIS — R7989 Other specified abnormal findings of blood chemistry: Secondary | ICD-10-CM

## 2018-03-09 DIAGNOSIS — R748 Abnormal levels of other serum enzymes: Secondary | ICD-10-CM | POA: Insufficient documentation

## 2018-03-09 LAB — CBC WITH DIFFERENTIAL/PLATELET
BASOS ABS: 0 10*3/uL (ref 0–0.1)
Basophils Relative: 0 %
Eosinophils Absolute: 0.1 10*3/uL (ref 0–0.7)
Eosinophils Relative: 1 %
HEMATOCRIT: 43.2 % (ref 40.0–52.0)
Hemoglobin: 14.9 g/dL (ref 13.0–18.0)
Lymphocytes Relative: 48 %
Lymphs Abs: 3.9 10*3/uL — ABNORMAL HIGH (ref 1.0–3.6)
MCH: 33.7 pg (ref 26.0–34.0)
MCHC: 34.5 g/dL (ref 32.0–36.0)
MCV: 97.7 fL (ref 80.0–100.0)
MONO ABS: 0.5 10*3/uL (ref 0.2–1.0)
Monocytes Relative: 6 %
NEUTROS ABS: 3.6 10*3/uL (ref 1.4–6.5)
NEUTROS PCT: 45 %
Platelets: 151 10*3/uL (ref 150–440)
RBC: 4.43 MIL/uL (ref 4.40–5.90)
RDW: 13.3 % (ref 11.5–14.5)
WBC: 8 10*3/uL (ref 3.8–10.6)

## 2018-03-09 LAB — COMPREHENSIVE METABOLIC PANEL
ALT: 18 U/L (ref 17–63)
AST: 20 U/L (ref 15–41)
Albumin: 4 g/dL (ref 3.5–5.0)
Alkaline Phosphatase: 67 U/L (ref 38–126)
Anion gap: 5 (ref 5–15)
BILIRUBIN TOTAL: 1.7 mg/dL — AB (ref 0.3–1.2)
BUN: 24 mg/dL — AB (ref 6–20)
CHLORIDE: 109 mmol/L (ref 101–111)
CO2: 26 mmol/L (ref 22–32)
Calcium: 8.8 mg/dL — ABNORMAL LOW (ref 8.9–10.3)
Creatinine, Ser: 1.2 mg/dL (ref 0.61–1.24)
GFR calc Af Amer: >60 mL/min (ref >60)
GFR calc non Af Amer: 60 mL/min — ABNORMAL LOW (ref >60)
Glucose, Bld: 129 mg/dL — ABNORMAL HIGH (ref 65–99)
POTASSIUM: 3.6 mmol/L (ref 3.5–5.1)
Sodium: 140 mmol/L (ref 135–145)
TOTAL PROTEIN: 6.3 g/dL — AB (ref 6.5–8.1)

## 2018-03-09 LAB — URINALYSIS, COMPLETE (UACMP) WITH MICROSCOPIC
BILIRUBIN URINE: NEGATIVE
Bacteria, UA: NONE SEEN
Glucose, UA: NEGATIVE mg/dL
Hgb urine dipstick: NEGATIVE
KETONES UR: NEGATIVE mg/dL
LEUKOCYTES UA: NEGATIVE
Nitrite: NEGATIVE
PROTEIN: NEGATIVE mg/dL
SQUAMOUS EPITHELIAL / LPF: NONE SEEN (ref 0–5)
Specific Gravity, Urine: 1.016 (ref 1.005–1.030)
pH: 6 (ref 5.0–8.0)

## 2018-03-09 LAB — TROPONIN I
TROPONIN I: 0.06 ng/mL — AB (ref <0.03)
TROPONIN I: 0.05 ng/mL — AB (ref <0.03)
Troponin I: 0.03 ng/mL (ref <0.03)

## 2018-03-09 MED ORDER — DOXAZOSIN MESYLATE 4 MG PO TABS
4.0000 mg | ORAL_TABLET | Freq: Every day | ORAL | Status: DC
  Administered 2018-03-09: 4 mg via ORAL
  Filled 2018-03-09 (×2): qty 1

## 2018-03-09 MED ORDER — HYDROCHLOROTHIAZIDE 12.5 MG PO CAPS: 12.5000 mg | ORAL_CAPSULE | Freq: Every day | ORAL | Status: DC

## 2018-03-09 MED ORDER — SODIUM CHLORIDE 0.9 % IV BOLUS
1000.0000 mL | Freq: Once | INTRAVENOUS | Status: AC
  Administered 2018-03-09: 1000 mL via INTRAVENOUS

## 2018-03-09 MED ORDER — ONDANSETRON HCL 4 MG PO TABS: 4.0000 mg | ORAL_TABLET | Freq: Four times a day (QID) | ORAL | Status: DC | PRN

## 2018-03-09 MED ORDER — PANTOPRAZOLE SODIUM 40 MG PO TBEC
40.0000 mg | DELAYED_RELEASE_TABLET | Freq: Every day | ORAL | Status: DC
  Filled 2018-03-09: qty 1

## 2018-03-09 MED ORDER — VITAMIN B-12 1000 MCG PO TABS
500.0000 ug | ORAL_TABLET | Freq: Every day | ORAL | Status: DC
  Administered 2018-03-10: 500 ug via ORAL
  Filled 2018-03-09: qty 1

## 2018-03-09 MED ORDER — DOCUSATE SODIUM 100 MG PO CAPS
100.0000 mg | ORAL_CAPSULE | Freq: Two times a day (BID) | ORAL | Status: DC
  Administered 2018-03-09: 100 mg via ORAL
  Filled 2018-03-09: qty 1

## 2018-03-09 MED ORDER — ACETAMINOPHEN 325 MG PO TABS: 650.0000 mg | ORAL_TABLET | Freq: Four times a day (QID) | ORAL | Status: DC | PRN

## 2018-03-09 MED ORDER — LISINOPRIL 20 MG PO TABS: 20.0000 mg | ORAL_TABLET | Freq: Every day | ORAL | Status: DC

## 2018-03-09 MED ORDER — POTASSIUM CHLORIDE IN NACL 20-0.45 MEQ/L-% IV SOLN
INTRAVENOUS | Status: DC
  Administered 2018-03-10: 02:00:00 via INTRAVENOUS
  Filled 2018-03-09 (×3): qty 1000

## 2018-03-09 MED ORDER — ONDANSETRON HCL 4 MG/2ML IJ SOLN
4.0000 mg | Freq: Once | INTRAMUSCULAR | Status: AC
  Administered 2018-03-09: 4 mg via INTRAVENOUS
  Filled 2018-03-09: qty 2

## 2018-03-09 MED ORDER — ENOXAPARIN SODIUM 40 MG/0.4ML ~~LOC~~ SOLN
40.0000 mg | SUBCUTANEOUS | Status: DC
  Administered 2018-03-09: 40 mg via SUBCUTANEOUS
  Filled 2018-03-09: qty 0.4

## 2018-03-09 MED ORDER — BISACODYL 10 MG RE SUPP: 10.0000 mg | Freq: Every day | RECTAL | Status: DC | PRN

## 2018-03-09 MED ORDER — LISINOPRIL-HYDROCHLOROTHIAZIDE 20-12.5 MG PO TABS: 1.0000 | ORAL_TABLET | Freq: Every day | ORAL | Status: DC

## 2018-03-09 MED ORDER — ONDANSETRON HCL 4 MG/2ML IJ SOLN: 4.0000 mg | Freq: Four times a day (QID) | INTRAMUSCULAR | Status: DC | PRN

## 2018-03-09 MED ORDER — ACETAMINOPHEN 650 MG RE SUPP: 650.0000 mg | Freq: Four times a day (QID) | RECTAL | Status: DC | PRN

## 2018-03-09 MED ORDER — NITROGLYCERIN 0.4 MG SL SUBL: 0.4000 mg | SUBLINGUAL_TABLET | SUBLINGUAL | Status: DC | PRN

## 2018-03-09 MED ORDER — ASPIRIN EC 325 MG PO TBEC
325.0000 mg | DELAYED_RELEASE_TABLET | Freq: Every day | ORAL | Status: DC
  Administered 2018-03-10: 325 mg via ORAL
  Filled 2018-03-09: qty 1

## 2018-03-09 NOTE — ED Triage Notes (Signed)
Pt to ED via EMS from home with c/o witnessed syncopal episode. PT denies any pain. VSS. MD at bedside.

## 2018-03-09 NOTE — H&P (Signed)
History and Physical    Ricardo Winters. DDU:[id number] DOB: 04-24-1946 DOA: 03/09/2018  Referring physician: Dr. Alfred Levins PCP: Rusty Aus, MD  Specialists: none  Chief Complaint: syncope  HPI: Ricardo Winters. is a 72 y.o. male has a past medical history significant for CAD and HTN now with syncope whole sitting down after breakfast associated with nausea. No vomiting or diarrhea. No fever. Denies CP or SOB. No palpitations. In ER, troponin mildly elevated. He is now admitted.  Review of Systems: The patient denies anorexia, fever, weight loss,, vision loss, decreased hearing, hoarseness, chest pain,  dyspnea on exertion, peripheral edema, balance deficits, hemoptysis, abdominal pain, melena, hematochezia, severe indigestion/heartburn, hematuria, incontinence, genital sores, muscle weakness, suspicious skin lesions, transient blindness, difficulty walking, depression, unusual weight change, abnormal bleeding, enlarged lymph nodes, angioedema, and breast masses.   Past Medical History:  Diagnosis Date  . Anginal pain ([place])   . Cancer ([place])    skin  . Coronary artery disease   . Edema   . Hypertension   . Myocardial infarction ([place])   . Obesity    Past Surgical History:  Procedure Laterality Date  . COLONOSCOPY WITH PROPOFOL N/A 12/12/2015   Procedure: COLONOSCOPY WITH PROPOFOL;  Surgeon: [name], MD;  Location: [facility] ENDOSCOPY;  Service: Endoscopy;  Laterality: N/A;  . SHOULDER ACROMIOPLASTY     Social History:  reports that he has never smoked. He has never used smokeless tobacco. He reports that he drinks about 2.4 oz of alcohol per week. He reports that he does not use drugs.  No Known Allergies  History reviewed. No pertinent family history.  Prior to Admission medications   Medication Sig Start Date End Date Taking? Authorizing Provider  aspirin 325 MG EC tablet Take 325 mg by mouth daily.   Yes [provider]  doxazosin (CARDURA) 4 MG  tablet Take 4 mg by mouth at bedtime.  12/26/17  Yes [provider]  lisinopril-hydrochlorothiazide (PRINZIDE,ZESTORETIC) 20-12.5 MG tablet Take 1 tablet by mouth daily. 02/06/18  Yes [provider]  nitroGLYCERIN (NITROSTAT) 0.4 MG SL tablet Place 0.4 mg under the tongue every 5 (five) minutes as needed for chest pain. Reported on 12/12/2015   Yes [provider]  sildenafil (REVATIO) 20 MG tablet Take 20 mg by mouth daily as needed (ED).    Yes [provider]  TURMERIC PO Take 900 mg by mouth daily.   Yes [provider]  vitamin B-12 (CYANOCOBALAMIN) 500 MCG tablet Take 500 mcg by mouth daily.   Yes [provider]  atorvastatin (LIPITOR) 20 MG tablet Take 20 mg by mouth daily.    [provider]   Physical Exam: Vitals:   03/09/18 1230 03/09/18 1330 03/09/18 1430 03/09/18 1500  BP: 137/71 126/70 (!) 157/74 130/64  Pulse:  (!) 55 66 (!) 57  Resp: 12     SpO2:  94% 96% 97%  Weight:      Height:         General:  No apparent distress, WDWN, [place]/AT  Eyes: PERRL, EOMI, no scleral icterus, conjunctiva clear  ENT: moist oropharynx without exudate, TM's benign, dentition good  Neck: supple, no lymphadenopathy. No bruits or thyromegaly  Cardiovascular: regular rate without MRG; 2+ peripheral pulses, no JVD, no peripheral edema  Respiratory: CTA biL, good air movement without wheezing, rhonchi or crackled. Respiratory effort normal  Abdomen: soft, non tender to palpation, positive bowel sounds, no guarding, no rebound  Skin: no  rashes or lesions  Musculoskeletal: normal bulk and tone, no joint swelling  Psychiatric: normal mood and affect, A&OX3  Neurologic: CN 2-12 grossly intact, Motor strength 5/5 in all 4 groups with symmetric DTR's and non-focal sensory exam  Labs on Admission:  Basic Metabolic Panel: Recent Labs  Lab 03/09/18 1154  NA 140  K 3.6  CL 109  CO2 26  GLUCOSE 129*  BUN 24*  CREATININE 1.20   CALCIUM 8.8*   Liver Function Tests: Recent Labs  Lab 03/09/18 1154  AST 20  ALT 18  ALKPHOS 67  BILITOT 1.7*  PROT 6.3*  ALBUMIN 4.0   No results for input(s): LIPASE, AMYLASE in the last 168 hours. No results for input(s): AMMONIA in the last 168 hours. CBC: Recent Labs  Lab 03/09/18 1154  WBC 8.0  NEUTROABS 3.6  HGB 14.9  HCT 43.2  MCV 97.7  PLT 151   Cardiac Enzymes: Recent Labs  Lab 03/09/18 1154 03/09/18 1457  TROPONINI 0.03* 0.06*    BNP (last 3 results) No results for input(s): BNP in the last 8760 hours.  ProBNP (last 3 results) No results for input(s): PROBNP in the last 8760 hours.  CBG: No results for input(s): GLUCAP in the last 168 hours.  Radiological Exams on Admission: No results found.  EKG: Independently reviewed.  Assessment/Plan Principal Problem:   Syncope Active Problems:   Elevated troponin   CAD (coronary artery disease)   HTN (hypertension)   Will observe on telemetry and follow enzymes. Echo and carotid US ordered. Consult Cardiology. Repeat labs in AM  Diet: low salt Fluids: 1/2 NS@75  DVT Prophylaxis: Lovenox  Code Status: FULL  Family Communication: yes  Disposition Plan: home  Time spent: 50 min

## 2018-03-09 NOTE — ED Provider Notes (Signed)
[facility] Emergency Department Provider Note  ____________________________________________  Time seen: Approximately 11:50 AM  I have reviewed the triage vital signs and the nursing notes.   HISTORY  Chief Complaint Near Syncope   HPI Ricardo Fleet. is a 72 y.o. male with h/o CAD s/p stent on Plavix, hypertension who presents for evaluation of a syncopal event.  Patient reports that he was in usual state of health.  He cooked breakfast and had a normal breakfast.  He then sat on the couch and watch TV.  While sitting he started feeling dizzy and nauseous and had a syncopal episode.  According to his girlfriend who was with him patient was out for about a minute.  No seizure-like activity.  No postictal phase.  Patient reports having prior episodes of dizziness but never had a full syncopal event before.  Patient denies headache, chest pain, shortness of breath, or any neurological deficits preceding or after his syncopal event.  At this time he reports that he feels slightly nauseous but continues to deny any chest pain.  Patient denies any melena.  Denies any recent illnesses.  He reports that yesterday he spent hours working the yard but he thought he was keeping himself well-hydrated and drink plenty of fluids.  Past Medical History:  Diagnosis Date  . Anginal pain ([place])   . Cancer ([place])    skin  . Coronary artery disease   . Edema   . Hypertension   . Myocardial infarction ([place])   . Obesity      Past Surgical History:  Procedure Laterality Date  . COLONOSCOPY WITH PROPOFOL N/A 12/12/2015   Procedure: COLONOSCOPY WITH PROPOFOL;  Surgeon: [name], MD;  Location: [facility] ENDOSCOPY;  Service: Endoscopy;  Laterality: N/A;  . SHOULDER ACROMIOPLASTY      Prior to Admission medications   Medication Sig Start Date End Date Taking? Authorizing Provider  aspirin 325 MG EC tablet Take 325 mg by mouth daily.   Yes [provider]    doxazosin (CARDURA) 4 MG tablet Take 4 mg by mouth at bedtime.  12/26/17  Yes [provider]  lisinopril-hydrochlorothiazide (PRINZIDE,ZESTORETIC) 20-12.5 MG tablet Take 1 tablet by mouth daily. 02/06/18  Yes [provider]  nitroGLYCERIN (NITROSTAT) 0.4 MG SL tablet Place 0.4 mg under the tongue every 5 (five) minutes as needed for chest pain. Reported on 12/12/2015   Yes [provider]  sildenafil (REVATIO) 20 MG tablet Take 20 mg by mouth daily as needed (ED).    Yes [provider]  TURMERIC PO Take 900 mg by mouth daily.   Yes [provider]  vitamin B-12 (CYANOCOBALAMIN) 500 MCG tablet Take 500 mcg by mouth daily.   Yes [provider]  atorvastatin (LIPITOR) 20 MG tablet Take 20 mg by mouth daily.    [provider]    Allergies Patient has no known allergies.  No family history on file.  Social History Social History   Tobacco Use  . Smoking status: Never Smoker  . Smokeless tobacco: Never Used  Substance Use Topics  . Alcohol use: Yes    Alcohol/week: 2.4 oz    Types: 4 Glasses of wine per week  . Drug use: No    Review of Systems  Constitutional: Negative for fever. + syncope Eyes: Negative for visual changes. ENT: Negative for sore throat. Neck: No neck pain  Cardiovascular: Negative for chest pain. Respiratory: Negative for shortness of breath. Gastrointestinal: Negative for  abdominal pain, vomiting or diarrhea. Genitourinary: Negative for dysuria. Musculoskeletal: Negative for back pain. Skin: Negative for rash. Neurological: Negative for headaches, weakness or numbness. Psych: No SI or HI  ____________________________________________   PHYSICAL EXAM:  VITAL SIGNS: ED Triage Vitals  Enc Vitals Group     BP --      Pulse Rate 03/09/18 1145 62     Resp 03/09/18 1145 16     Temp --      Temp src --      SpO2 03/09/18 1145 100 %     Weight 03/09/18 1146 238 lb (108 kg)     Height  03/09/18 1146 6\' 2"  (1.88 m)     Head Circumference --      Peak Flow --      Pain Score 03/09/18 1145 0     Pain Loc --      Pain Edu? --      Excl. in [place]? --     Constitutional: Alert and oriented. Well appearing and in no apparent distress. HEENT:      Head: Normocephalic and atraumatic.         Eyes: Conjunctivae are normal. Sclera is non-icteric.       Mouth/Throat: Mucous membranes are moist.       Neck: Supple with no signs of meningismus. Cardiovascular: Regular rate and rhythm. No murmurs, gallops, or rubs. 2+ symmetrical distal pulses are present in all extremities. No JVD. Respiratory: Normal respiratory effort. Lungs are clear to auscultation bilaterally. No wheezes, crackles, or rhonchi.  Gastrointestinal: Soft, non tender, and non distended with positive bowel sounds. No rebound or guarding. Musculoskeletal: Nontender with normal range of motion in all extremities. No edema, cyanosis, or erythema of extremities. Neurologic: Normal speech and language. A & O x3, PERRL, EOMI, no nystagmus, CN II-XII intact, motor testing reveals good tone and bulk throughout. There is no evidence of pronator drift or dysmetria. Muscle strength is 5/5 throughout. Sensory examination is intact. Gait is normal. Skin: Skin is warm, dry and intact. No rash noted. Psychiatric: Mood and affect are normal. Speech and behavior are normal.  ____________________________________________   LABS (all labs ordered are listed, but only abnormal results are displayed)  Labs Reviewed  CBC WITH DIFFERENTIAL/PLATELET - Abnormal; Notable for the following components:      Result Value   Lymphs Abs 3.9 (*)    All other components within normal limits  COMPREHENSIVE METABOLIC PANEL - Abnormal; Notable for the following components:   Glucose, Bld 129 (*)    BUN 24 (*)    Calcium 8.8 (*)    Total Protein 6.3 (*)    Total Bilirubin 1.7 (*)    GFR calc non Af Amer 60 (*)    All other components within normal  limits  TROPONIN I - Abnormal; Notable for the following components:   Troponin I 0.03 (*)    All other components within normal limits  URINALYSIS, COMPLETE (UACMP) WITH MICROSCOPIC - Abnormal; Notable for the following components:   Color, Urine YELLOW (*)    APPearance CLEAR (*)    All other components within normal limits  TROPONIN I - Abnormal; Notable for the following components:   Troponin I 0.06 (*)    All other components within normal limits   ____________________________________________  EKG  ED ECG REPORT I, Rudene Re, the attending physician, personally viewed and interpreted this ECG.  Sinus bradycardia, rate of 58, first-degree AV block, normal QRS and QTC, normal axis,  no ST elevations or depressions, Q waves in V1 and V2.  Unchanged from prior  12:59 -sinus bradycardia, rate of 54, first-degree AV block, normal QRS and QTC, normal axis, no ST elevations or depressions, persistent Q waves in V1 and V2.  Unchanged from initial. ____________________________________________  RADIOLOGY  none  ____________________________________________   PROCEDURES  Procedure(s) performed: None Procedures Critical Care performed:  None ____________________________________________   INITIAL IMPRESSION / ASSESSMENT AND PLAN / ED COURSE  72 y.o. male with h/o CAD s/p stent on Plavix, hypertension who presents for evaluation of a syncopal event preceded by nausea and dizziness.  Patient is well-appearing, neurologically intact, EKG with no ischemia or arrhythmias.  Orthostatic vital signs showing drop in blood pressure from 160/86 lying to 139/83 standing but no changes in heart rate.  Patient did work in the heat for several hours yesterday and he could be slightly dehydrated.  Patient is also on a blood thinner so could have anemia.  Could also be cardiac arrhythmia.  Will monitor on telemetry, check labs, give IV fluids and Zofran.  There are no signs of stroke.      _________________________ 1:02 PM on 03/09/2018 -----------------------------------------  Initial troponin is 0.03.  Patient has had 2 EKGs with no ischemia or dysrhythmias.  Patient remains without any chest pain.  Remainder of his labs are within normal limits.  Patient continues to be monitored on telemetry with no arrhythmias.  Will check a second troponin every 3 hours and if it is unchanged patient will most likely be discharged home.   _________________________ 3:56 PM on 03/09/2018 -----------------------------------------  Patient second troponin is now 0.06.  Patient remains asymptomatic with no chest pain.  At this time with a troponin is trending up and a syncopal episode on a 72 year old male decision was made to admit for further management.  As part of my medical decision making, I reviewed the following data within the [place] notes reviewed and incorporated, Labs reviewed , EKG interpreted , Old EKG reviewed, Old chart reviewed, Radiograph reviewed , Discussed with admitting physician , Notes from prior ED visits and [place] Controlled Substance Database    Pertinent labs & imaging results that were available during my care of the patient were reviewed by me and considered in my medical decision making (see chart for details).    ____________________________________________   FINAL CLINICAL IMPRESSION(S) / ED DIAGNOSES  Final diagnoses:  Syncope, unspecified syncope type  NSTEMI (non-ST elevated myocardial infarction) ([place])      NEW MEDICATIONS STARTED DURING THIS VISIT:  ED Discharge Orders    None       Note:  This document was prepared using Dragon voice recognition software and may include unintentional dictation errors.    Alfred Levins, [place], MD 03/09/18 1556

## 2018-03-09 NOTE — Therapy (Signed)
Pt refused incentive spirometer.  Pt states that he is able to deep breath and cough with the assistance of the incentive.  Patient is in no distress on room air.

## 2018-03-10 ENCOUNTER — Observation Stay
Admit: 2018-03-10 | Discharge: 2018-03-10 | Disposition: A | Discharge status: Home / Self Care | Payer: Medicare HMO | Attending: Internal Medicine | Admitting: Internal Medicine

## 2018-03-10 LAB — CBC
HCT: 39.5 % — ABNORMAL LOW (ref 40.0–52.0)
Hemoglobin: 13.9 g/dL (ref 13.0–18.0)
MCH: 34.7 pg — ABNORMAL HIGH (ref 26.0–34.0)
MCHC: 35.2 g/dL (ref 32.0–36.0)
MCV: 98.5 fL (ref 80.0–100.0)
Platelets: 141 10*3/uL — ABNORMAL LOW (ref 150–440)
RBC: 4.02 MIL/uL — AB (ref 4.40–5.90)
RDW: 13.5 % (ref 11.5–14.5)
WBC: 7.2 10*3/uL (ref 3.8–10.6)

## 2018-03-10 LAB — COMPREHENSIVE METABOLIC PANEL
ALK PHOS: 57 U/L (ref 38–126)
ALT: 16 U/L — ABNORMAL LOW (ref 17–63)
ANION GAP: 5 (ref 5–15)
AST: 18 U/L (ref 15–41)
Albumin: 3.2 g/dL — ABNORMAL LOW (ref 3.5–5.0)
BILIRUBIN TOTAL: 1.5 mg/dL — AB (ref 0.3–1.2)
BUN: 18 mg/dL (ref 6–20)
CALCIUM: 8.6 mg/dL — AB (ref 8.9–10.3)
CO2: 27 mmol/L (ref 22–32)
Chloride: 108 mmol/L (ref 101–111)
Creatinine, Ser: 1.14 mg/dL (ref 0.61–1.24)
GFR calc non Af Amer: >60 mL/min (ref >60)
Glucose, Bld: 109 mg/dL — ABNORMAL HIGH (ref 65–99)
Potassium: 3.9 mmol/L (ref 3.5–5.1)
SODIUM: 140 mmol/L (ref 135–145)
TOTAL PROTEIN: 5.2 g/dL — AB (ref 6.5–8.1)

## 2018-03-10 LAB — ECHOCARDIOGRAM COMPLETE
HEIGHTINCHES: 74 in
Weight: 3800 oz

## 2018-03-10 LAB — TROPONIN I
TROPONIN I: 0.04 ng/mL — AB (ref <0.03)
Troponin I: 0.03 ng/mL (ref <0.03)

## 2018-03-10 MED ORDER — LISINOPRIL-HYDROCHLOROTHIAZIDE 20-12.5 MG PO TABS: 0.5000 | ORAL_TABLET | Freq: Every day | ORAL | 0 refills | Status: DC

## 2018-03-10 MED ORDER — HYDROCHLOROTHIAZIDE 10 MG/ML ORAL SUSPENSION
6.2500 mg | Freq: Every day | ORAL | Status: DC
  Filled 2018-03-10: qty 1.25

## 2018-03-10 MED ORDER — ROSUVASTATIN CALCIUM 10 MG PO TABS: 10.0000 mg | ORAL_TABLET | Freq: Every day | ORAL | Status: DC

## 2018-03-10 MED ORDER — ROSUVASTATIN CALCIUM 10 MG PO TABS: 10.0000 mg | ORAL_TABLET | Freq: Every day | ORAL | 0 refills | Status: DC

## 2018-03-10 MED ORDER — LISINOPRIL 10 MG PO TABS
10.0000 mg | ORAL_TABLET | Freq: Every day | ORAL | Status: DC
  Administered 2018-03-10: 10 mg via ORAL
  Filled 2018-03-10: qty 1

## 2018-03-10 NOTE — Progress Notes (Signed)
PT Cancellation Note  Patient Details Name: Ricardo Winters. MRN: [id number] DOB: 1946/04/07   Cancelled Treatment:    Reason Eval/Treat Not Completed: (Consult received and chart reviewed.  Per charting and discussion with primary RN, patient has ambulated around unit without difficulty, LOB or safety concern.  No need for formal PT intervention at this time.  Planning for discharge home. Will complete initial PT order at this time; please re-consult should needs change.)   Gavyn Ybarra H. Owens Shark, PT, DPT, NCS 03/10/18, 11:33 AM [phone]

## 2018-03-10 NOTE — Progress Notes (Addendum)
Pt refused bed alarm but was educated about safety. Will continue to monitor.  Update 0414: after my neuro assessment pt ask me if I can talked to the doctor on-call to ask if he can be discharge early this morning. Pt has a scheduled echo today but not sure about the time. Page prime and talked to doctor sridharan. He states that he cant do nothing at this time and would need for the day doctor to decide on it. Will relay this to day shift nurse. Will continue to monitor.

## 2018-03-10 NOTE — Plan of Care (Signed)
  Problem: Clinical Measurements: Goal: Will remain free from infection Outcome: Progressing   Problem: Safety: Goal: Ability to remain free from injury will improve Outcome: Progressing   

## 2018-03-10 NOTE — Discharge Summary (Signed)
[facility] [place] NAME: Ricardo Winters    MR#:  [id number]  DATE OF BIRTH:  October 26, 1946  DATE OF ADMISSION:  03/09/2018 ADMITTING PHYSICIAN: Idelle Crouch, MD  DATE OF DISCHARGE: No discharge date for patient encounter.  PRIMARY CARE PHYSICIAN: Rusty Aus, MD    ADMISSION DIAGNOSIS:  Syncope [R55] NSTEMI (non-ST elevated myocardial infarction) ([place]) [I21.4] Syncope, unspecified syncope type [R55]  DISCHARGE DIAGNOSIS:  Principal Problem:   Syncope Active Problems:   Elevated troponin   CAD (coronary artery disease)   HTN (hypertension)   SECONDARY DIAGNOSIS:   Past Medical History:  Diagnosis Date  . Anginal pain ([place])   . Cancer ([place])    skin  . Coronary artery disease   . Edema   . Hypertension   . Myocardial infarction ([place])   . Obesity     HOSPITAL COURSE:  *Acute syncope Resolved patient was referred to the observation unit, Vital signs were normal, carotid Dopplers negative for hemodynamically significant stenosis, patient evaluated by cardiology-patient changed to Crestor/lisinopril and hydrochlorothiazide were reduced in half/recommended follow-up with cardiology-Dr. Clayborn Bigness in 1 week for Holter monitor placement/echo reviewed by cardiology and was deemed appropriate for discharge to home, patient ambulated in the halls without difficulty per nursing staff, patient now stable and was discharged home  *Acute Elevated troponin Most likely secondary to demand and not ischemia Cardiology to see patient while in house-no acute intervention was recommended  *Acute mild bradycardia Stable Patient not on AV nodal blocking agents, deemed appropriate for discharge per cardiology with outpatient follow-up as stated above  *History of coronary artery disease/hypertension Stable  DISCHARGE CONDITIONS:  Stable   CONSULTS OBTAINED:  Treatment Team:  Corey Skains, MD Teodoro Spray, MD  DRUG  ALLERGIES:  No Known Allergies  DISCHARGE MEDICATIONS:   Allergies as of 03/10/2018   No Known Allergies     Medication List    STOP taking these medications   atorvastatin 20 MG tablet Commonly known as:  LIPITOR     TAKE these medications   aspirin 325 MG EC tablet Take 325 mg by mouth daily.   doxazosin 4 MG tablet Commonly known as:  CARDURA Take 4 mg by mouth at bedtime.   lisinopril-hydrochlorothiazide 20-12.5 MG tablet Commonly known as:  PRINZIDE,ZESTORETIC Take 0.5 tablets by mouth daily. What changed:  how much to take   nitroGLYCERIN 0.4 MG SL tablet Commonly known as:  NITROSTAT Place 0.4 mg under the tongue every 5 (five) minutes as needed for chest pain. Reported on 12/12/2015   rosuvastatin 10 MG tablet Commonly known as:  CRESTOR Take 1 tablet (10 mg total) by mouth daily at 6 PM.   sildenafil 20 MG tablet Commonly known as:  REVATIO Take 20 mg by mouth daily as needed (ED).   TURMERIC PO Take 900 mg by mouth daily.   vitamin B-12 500 MCG tablet Commonly known as:  CYANOCOBALAMIN Take 500 mcg by mouth daily.        DISCHARGE INSTRUCTIONS:   If you experience worsening of your admission symptoms, develop shortness of breath, life threatening emergency, suicidal or homicidal thoughts you must seek medical attention immediately by calling 911 or calling your MD immediately  if symptoms less severe.  You Must read complete instructions/literature along with all the possible adverse reactions/side effects for all the Medicines you take and that have been prescribed to you. Take any new Medicines after you have completely understood  and accept all the possible adverse reactions/side effects.   Please note  You were cared for by a hospitalist during your hospital stay. If you have any questions about your discharge medications or the care you received while you were in the hospital after you are discharged, you can call the unit and asked to speak  with the hospitalist on call if the hospitalist that took care of you is not available. Once you are discharged, your primary care physician will handle any further medical issues. Please note that NO REFILLS for any discharge medications will be authorized once you are discharged, as it is imperative that you return to your primary care physician (or establish a relationship with a primary care physician if you do not have one) for your aftercare needs so that they can reassess your need for medications and monitor your lab values.    Today   CHIEF COMPLAINT:   Chief Complaint  Patient presents with  . Near Syncope    HISTORY OF PRESENT ILLNESS:  72 y.o. male has a past medical history significant for CAD and HTN now with syncope whole sitting down after breakfast associated with nausea. No vomiting or diarrhea. No fever. Denies CP or SOB. No palpitations. In ER, troponin mildly elevated. He is now admitted.  VITAL SIGNS:  Blood pressure (!) 146/86, pulse (!) 53, temperature 97.6 F (36.4 C), resp. rate 16, height 6\' 2"  (1.88 m), weight 107.7 kg (237 lb 8 oz), SpO2 96 %.  I/O:    Intake/Output Summary (Last 24 hours) at 03/10/2018 1045 Last data filed at 03/10/2018 1033 Gross per 24 hour  Intake 900 ml  Output 620 ml  Net 280 ml    PHYSICAL EXAMINATION:  GENERAL:  72 y.o.-year-old patient lying in the bed with no acute distress.  EYES: Pupils equal, round, reactive to light and accommodation. No scleral icterus. Extraocular muscles intact.  HEENT: Head atraumatic, normocephalic. Oropharynx and nasopharynx clear.  NECK:  Supple, no jugular venous distention. No thyroid enlargement, no tenderness.  LUNGS: Normal breath sounds bilaterally, no wheezing, rales,rhonchi or crepitation. No use of accessory muscles of respiration.  CARDIOVASCULAR: S1, S2 normal. No murmurs, rubs, or gallops.  ABDOMEN: Soft, non-tender, non-distended. Bowel sounds present. No organomegaly or mass.   EXTREMITIES: No pedal edema, cyanosis, or clubbing.  NEUROLOGIC: Cranial nerves II through XII are intact. Muscle strength 5/5 in all extremities. Sensation intact. Gait not checked.  PSYCHIATRIC: The patient is alert and oriented x 3.  SKIN: No obvious rash, lesion, or ulcer.   DATA REVIEW:   CBC Recent Labs  Lab 03/10/18 0506  WBC 7.2  HGB 13.9  HCT 39.5*  PLT 141*    Chemistries  Recent Labs  Lab 03/10/18 0506  NA 140  K 3.9  CL 108  CO2 27  GLUCOSE 109*  BUN 18  CREATININE 1.14  CALCIUM 8.6*  AST 18  ALT 16*  ALKPHOS 57  BILITOT 1.5*    Cardiac Enzymes Recent Labs  Lab 03/10/18 0506  TROPONINI 0.03*    Microbiology Results  No results found for this or any previous visit.  RADIOLOGY:  US Carotid Bilateral  Result Date: 03/09/2018 CLINICAL DATA:  Syncopal episode EXAM: BILATERAL CAROTID DUPLEX ULTRASOUND TECHNIQUE: Pearline Cables scale imaging, color Doppler and duplex ultrasound were performed of bilateral carotid and vertebral arteries in the neck. COMPARISON:: COMPARISON: 04/18/2017 FINDINGS: Criteria: Quantification of carotid stenosis is based on velocity parameters that correlate the residual internal carotid diameter with NASCET-based stenosis  levels, using the diameter of the distal internal carotid lumen as the denominator for stenosis measurement. The following velocity measurements were obtained: RIGHT ICA: 92/26 cm/sec CCA: 52/77 cm/sec SYSTOLIC ICA/CCA RATIO:  1.2 ECA:  184 cm/sec LEFT ICA: 75/20 cm/sec CCA: 824/23 cm/sec SYSTOLIC ICA/CCA RATIO:  0.5 ECA:  113 cm/sec RIGHT CAROTID ARTERY: Grayscale images demonstrate only minimal atherosclerotic plaque similar to that seen on the prior exam. Evaluation of the waveforms, velocities and flow velocity ratios show no evidence of focal hemodynamically significant stenosis. RIGHT VERTEBRAL ARTERY:  Antegrade in nature. LEFT CAROTID ARTERY: Grayscale images demonstrate no significant atherosclerotic change. The  waveforms, velocities and flow velocity ratios show no evidence of focal hemodynamically significant stenosis. LEFT VERTEBRAL ARTERY:  Antegrade in nature. IMPRESSION: No evidence of focal carotid stenosis. Electronically Signed   By: Inez Catalina M.D.   On: 03/09/2018 17:12   Dg Chest Port 1 View  Result Date: 03/09/2018 CLINICAL DATA:  Syncope EXAM: PORTABLE CHEST 1 VIEW COMPARISON:  03/18/2017 and prior exams FINDINGS: The cardiomediastinal silhouette is unremarkable. There is no evidence of focal airspace disease, pulmonary edema, suspicious pulmonary nodule/mass, pleural effusion, or pneumothorax. No acute bony abnormalities are identified. IMPRESSION: No active disease. Electronically Signed   By: Margarette Canada M.D.   On: 03/09/2018 16:22    EKG:   Orders placed or performed during the hospital encounter of 03/09/18  . EKG 12-Lead  . EKG 12-Lead  . ED EKG  . ED EKG  . EKG 12-Lead  . EKG 12-Lead      Management plans discussed with the patient, family and they are in agreement.  CODE STATUS:     Code Status Orders  (From admission, onward)        Start     Ordered   03/09/18 1737  Full code  Continuous     03/09/18 1736    Code Status History    This patient has a current code status but no historical code status.      TOTAL TIME TAKING CARE OF THIS PATIENT: 35 minutes.    Avel Peace Elihue Ebert M.D on 03/10/2018 at 10:45 AM  Between 7am to 6pm - Pager - [phone]  After 6pm go to [email or web address] - password [email or web address] [facility]  Office  [phone]  CC: Primary care physician; Rusty Aus, MD   Note: This dictation was prepared with Dragon dictation along with smaller phrase technology. Any transcriptional errors that result from this process are unintentional.

## 2018-03-10 NOTE — Progress Notes (Signed)
*  PRELIMINARY RESULTS* Echocardiogram 2D Echocardiogram has been performed.  Sherrie Sport 03/10/2018, 8:58 AM

## 2018-03-10 NOTE — Progress Notes (Signed)
Pt to be discharged today. Iv and tele removed. Pt up to shower. disch instructions and prescriptions given to the pt to his understanding. disch via w.c.

## 2018-03-10 NOTE — Progress Notes (Signed)
Pt ambulated at length in hallway on room air. Gait steady and brisk. Tolerated well.

## 2018-03-10 NOTE — Consult Note (Addendum)
Cardiology Consultation Note    Patient ID: Ricardo Garrido., MRN: [id number], DOB/AGE: 1946-01-07 72 y.o. Admit date: 03/09/2018   Date of Consult: 03/10/2018 Primary Physician: Rusty Aus, MD Primary Cardiologist: Dr. Clayborn Bigness  Chief Complaint: syncope Reason for Consultation: syncope/elevated tropnin Requesting MD: Dr. Jerelyn Seabron  HPI: Ricardo Arora. is a 72 y.o. male with history of coronary artery disease status post PCI of the RCA in 2015 after suffering a ST elevation myocardial infarction.  He had a drug-eluting stent placed in the RCA with insignificant disease elsewhere.  He has done well since that time on dual antiplatelet therapy.  He is also on atorvastatin but has had myalgias to this.  This was discontinued by himself approximately 1 month ago and his myalgias have resolved.  Patient was admitted after suffering a syncopal episode.  He was quite active on the day of admission.  He is noted his yard and exercised on a stairstepper.  He ate breakfast sat down and felt somewhat lightheaded and nauseated followed by a brief syncopal episode.  He had no witnessed seizure activity.  He had no loss of bowel or bladder.  He presented to the emergency room where his laboratories revealed mild volume depletion with a BUN and creatinine of 22 and 1.25.  He was given volume resuscitation and his BUN and creatinine are now 18 and 1.14.  He was relatively bradycardic but no pauses.  There were no arrhythmias.  No ischemic changes.  His serum troponin was borderline elevated at 0.06.  He has no chest pain with vigorous activity.  He has had no further episodes of lightheadedness.  He has not had recurrent syncopal episodes in the past.  Echocardiogram is pending.  He is on lisinopril/hydrochlorothiazide at 20/12.5 as an outpatient.  Past Medical History:  Diagnosis Date  . Anginal pain ([place])   . Cancer ([place])    skin  . Coronary artery disease   . Edema   . Hypertension   .  Myocardial infarction ([place])   . Obesity       Surgical History:  Past Surgical History:  Procedure Laterality Date  . COLONOSCOPY WITH PROPOFOL N/A 12/12/2015   Procedure: COLONOSCOPY WITH PROPOFOL;  Surgeon: [name], MD;  Location: [facility] ENDOSCOPY;  Service: Endoscopy;  Laterality: N/A;  . SHOULDER ACROMIOPLASTY       Home Meds: Prior to Admission medications   Medication Sig Start Date End Date Taking? Authorizing Provider  aspirin 325 MG EC tablet Take 325 mg by mouth daily.   Yes [provider]  doxazosin (CARDURA) 4 MG tablet Take 4 mg by mouth at bedtime.  12/26/17  Yes [provider]  lisinopril-hydrochlorothiazide (PRINZIDE,ZESTORETIC) 20-12.5 MG tablet Take 1 tablet by mouth daily. 02/06/18  Yes [provider]  nitroGLYCERIN (NITROSTAT) 0.4 MG SL tablet Place 0.4 mg under the tongue every 5 (five) minutes as needed for chest pain. Reported on 12/12/2015   Yes [provider]  sildenafil (REVATIO) 20 MG tablet Take 20 mg by mouth daily as needed (ED).    Yes [provider]  TURMERIC PO Take 900 mg by mouth daily.   Yes [provider]  vitamin B-12 (CYANOCOBALAMIN) 500 MCG tablet Take 500 mcg by mouth daily.   Yes [provider]  atorvastatin (LIPITOR) 20 MG tablet Take 20 mg by mouth daily.    [provider]    Inpatient Medications:  . aspirin  325 mg Oral Daily  .  docusate sodium  100 mg Oral BID  . doxazosin  4 mg Oral QHS  . enoxaparin (LOVENOX) injection  40 mg Subcutaneous Q24H  . lisinopril  10 mg Oral Daily   And  . hydrochlorothiazide  6.25 mg Oral Daily  . pantoprazole  40 mg Oral Daily  . rosuvastatin  10 mg Oral q1800  . vitamin B-12  500 mcg Oral Daily   . 0.45 % NaCl with KCl 20 mEq / L 75 mL/hr at 03/10/18 0144    Allergies: No Known Allergies  Social History   Socioeconomic History  . Marital status: Divorced    Spouse name: Not on file  . Number of children: Not  on file  . Years of education: Not on file  . Highest education level: Not on file  Occupational History  . Not on file  Social Needs  . Financial resource strain: Not on file  . Food insecurity:    Worry: Not on file    Inability: Not on file  . Transportation needs:    Medical: Not on file    Non-medical: Not on file  Tobacco Use  . Smoking status: Never Smoker  . Smokeless tobacco: Never Used  Substance and Sexual Activity  . Alcohol use: Yes    Alcohol/week: 2.4 oz    Types: 4 Glasses of wine per week  . Drug use: No  . Sexual activity: Not on file  Lifestyle  . Physical activity:    Days per week: Not on file    Minutes per session: Not on file  . Stress: Not on file  Relationships  . Social connections:    Talks on phone: Not on file    Gets together: Not on file    Attends religious service: Not on file    Active member of club or organization: Not on file    Attends meetings of clubs or organizations: Not on file    Relationship status: Not on file  . Intimate partner violence:    Fear of current or ex partner: Not on file    Emotionally abused: Not on file    Physically abused: Not on file    Forced sexual activity: Not on file  Other Topics Concern  . Not on file  Social History Narrative  . Not on file     History reviewed. No pertinent family history.   Review of Systems: A 12-system review of systems was performed and is negative except as noted in the HPI.  Labs: Recent Labs    03/09/18 1457 03/09/18 1751 03/09/18 2346 03/10/18 0506  TROPONINI 0.06* 0.05* 0.04* 0.03*   Lab Results  Component Value Date   WBC 7.2 03/10/2018   HGB 13.9 03/10/2018   HCT 39.5 (L) 03/10/2018   MCV 98.5 03/10/2018   PLT 141 (L) 03/10/2018    Recent Labs  Lab 03/10/18 0506  NA 140  K 3.9  CL 108  CO2 27  BUN 18  CREATININE 1.14  CALCIUM 8.6*  PROT 5.2*  BILITOT 1.5*  ALKPHOS 57  ALT 16*  AST 18  GLUCOSE 109*   Lab Results  Component Value  Date   CHOL 237 (H) 08/02/2014   HDL 56 08/02/2014   LDLCALC 142 (H) 08/02/2014   TRIG 197 08/02/2014   No results found for: DDIMER  Radiology/Studies:  US Carotid Bilateral  Result Date: 03/09/2018 CLINICAL DATA:  Syncopal episode EXAM: BILATERAL CAROTID DUPLEX ULTRASOUND TECHNIQUE: Pearline Cables scale imaging, color Doppler and  duplex ultrasound were performed of bilateral carotid and vertebral arteries in the neck. COMPARISON:: COMPARISON: 04/18/2017 FINDINGS: Criteria: Quantification of carotid stenosis is based on velocity parameters that correlate the residual internal carotid diameter with NASCET-based stenosis levels, using the diameter of the distal internal carotid lumen as the denominator for stenosis measurement. The following velocity measurements were obtained: RIGHT ICA: 92/26 cm/sec CCA: 62/69 cm/sec SYSTOLIC ICA/CCA RATIO:  1.2 ECA:  184 cm/sec LEFT ICA: 75/20 cm/sec CCA: 485/46 cm/sec SYSTOLIC ICA/CCA RATIO:  0.5 ECA:  113 cm/sec RIGHT CAROTID ARTERY: Grayscale images demonstrate only minimal atherosclerotic plaque similar to that seen on the prior exam. Evaluation of the waveforms, velocities and flow velocity ratios show no evidence of focal hemodynamically significant stenosis. RIGHT VERTEBRAL ARTERY:  Antegrade in nature. LEFT CAROTID ARTERY: Grayscale images demonstrate no significant atherosclerotic change. The waveforms, velocities and flow velocity ratios show no evidence of focal hemodynamically significant stenosis. LEFT VERTEBRAL ARTERY:  Antegrade in nature. IMPRESSION: No evidence of focal carotid stenosis. Electronically Signed   By: Inez Catalina M.D.   On: 03/09/2018 17:12   Dg Chest Port 1 View  Result Date: 03/09/2018 CLINICAL DATA:  Syncope EXAM: PORTABLE CHEST 1 VIEW COMPARISON:  03/18/2017 and prior exams FINDINGS: The cardiomediastinal silhouette is unremarkable. There is no evidence of focal airspace disease, pulmonary edema, suspicious pulmonary nodule/mass, pleural  effusion, or pneumothorax. No acute bony abnormalities are identified. IMPRESSION: No active disease. Electronically Signed   By: Margarette Canada M.D.   On: 03/09/2018 16:22    Wt Readings from Last 3 Encounters:  03/10/18 107.7 kg (237 lb 8 oz)  03/18/17 104.3 kg (230 lb)  12/12/15 101.2 kg (223 lb)    EKG: Sinus bradycardia with no ischemic changes.  Physical Exam:  Blood pressure (!) 144/86, pulse (!) 53, temperature 97.6 F (36.4 C), temperature source Oral, resp. rate 16, height 6\' 2"  (1.88 m), weight 107.7 kg (237 lb 8 oz), SpO2 95 %. Body mass index is 30.49 kg/m. General: Well developed, well nourished, in no acute distress. Head: Normocephalic, atraumatic, sclera non-icteric, no xanthomas, nares are without discharge.  Neck: Negative for carotid bruits. JVD not elevated. Lungs: Clear bilaterally to auscultation without wheezes, rales, or rhonchi. Breathing is unlabored. Heart: RRR with S1 S2. No murmurs, rubs, or gallops appreciated. Abdomen: Soft, non-tender, non-distended with normoactive bowel sounds. No hepatomegaly. No rebound/guarding. No obvious abdominal masses. Msk:  Strength and tone appear normal for age. Extremities: No clubbing or cyanosis. No edema.  Distal pedal pulses are 2+ and equal bilaterally. Neuro: Alert and oriented X 3. No facial asymmetry. No focal deficit. Moves all extremities spontaneously. Psych:  Responds to questions appropriately with a normal affect.     Assessment and Plan  72 year old male with history of coronary disease status post ST elevation myocardial infarction in 2015 status post PCI admitted with a syncopal episode.  He had borderline troponin elevation.  This appears to be likely secondary to volume depletion.  He is quite active.  He has relative bradycardia but has had no syncopal episodes.  He was quite vigorous mowing yards and exercising without any chest pain, lightheadedness or syncope.  His laboratories on admission showed mild  volume depletion.  He has been resuscitated with improvement in his renal function.  EKG is unremarkable.  Serum troponin elevation appears to be demand and not ischemic.  He had no chest pain similar to his myocardial infarction.  He does remain relatively bradycardic and is on on no AV  nodal medications.  He has not noted any syncopal episodes prior to this or even lightheadedness other than when being orthostatic.  Will review echocardiogram when completed.  Will reduce his lisinopril/hydrochlorothiazide to one half of that tablet daily.  Since he had improvement in his myalgias with stopping atorvastatin, will discontinue atorvastatin and placed on Crestor at 10 mg daily.  Would ambulate and discharge of echo was unremarkable.  Would follow-up with Dr. Clayborn Bigness in 1 week with consideration for Holter monitor to be placed at that time.  Signed, Teodoro Spray MD 03/10/2018, 8:31 AM Pager: [phone]   Addendum-echocardiogram shows no significant structural abnormalities.  Would ambulate and discharge on aforementioned medication recommendations.

## 2018-03-27 ENCOUNTER — Encounter: Payer: Self-pay | Admitting: Student in an Organized Health Care Education/Training Program

## 2018-03-27 ENCOUNTER — Ambulatory Visit
Payer: Medicare HMO | Attending: Student in an Organized Health Care Education/Training Program | Admitting: Student in an Organized Health Care Education/Training Program

## 2018-03-27 ENCOUNTER — Other Ambulatory Visit: Payer: Self-pay

## 2018-03-27 VITALS — BP 171/95 | HR 53 | Temp 98.2°F | Resp 16 | Ht 74.0 in | Wt 239.0 lb

## 2018-03-27 DIAGNOSIS — G894 Chronic pain syndrome: Secondary | ICD-10-CM | POA: Diagnosis not present

## 2018-03-27 DIAGNOSIS — I2510 Atherosclerotic heart disease of native coronary artery without angina pectoris: Secondary | ICD-10-CM | POA: Insufficient documentation

## 2018-03-27 DIAGNOSIS — M1712 Unilateral primary osteoarthritis, left knee: Secondary | ICD-10-CM | POA: Diagnosis not present

## 2018-03-27 DIAGNOSIS — E669 Obesity, unspecified: Secondary | ICD-10-CM | POA: Diagnosis not present

## 2018-03-27 DIAGNOSIS — M1612 Unilateral primary osteoarthritis, left hip: Secondary | ICD-10-CM

## 2018-03-27 DIAGNOSIS — M25552 Pain in left hip: Secondary | ICD-10-CM | POA: Diagnosis not present

## 2018-03-27 DIAGNOSIS — G8929 Other chronic pain: Secondary | ICD-10-CM

## 2018-03-27 DIAGNOSIS — I252 Old myocardial infarction: Secondary | ICD-10-CM | POA: Diagnosis not present

## 2018-03-27 DIAGNOSIS — Z6830 Body mass index (BMI) 30.0-30.9, adult: Secondary | ICD-10-CM | POA: Insufficient documentation

## 2018-03-27 DIAGNOSIS — Z96641 Presence of right artificial hip joint: Secondary | ICD-10-CM | POA: Diagnosis not present

## 2018-03-27 NOTE — Progress Notes (Signed)
Safety precautions to be maintained throughout the outpatient stay will include: orient to surroundings, keep bed in low position, maintain call bell within reach at all times, provide assistance with transfer out of bed and ambulation.  

## 2018-03-27 NOTE — Patient Instructions (Addendum)
_______________________________________________________________________________Follow up with x-ray of SI joints and procedure Genicular Nerve Block. _____________  Preparing for Procedure with Sedation  Instructions: . Oral Intake: Do not eat or drink anything for at least 8 hours prior to your procedure. . Transportation: Public transportation is not allowed. Bring an adult driver. The driver must be physically present in our waiting room before any procedure can be started. Marland Kitchen Physical Assistance: Bring an adult physically capable of assisting you, in the event you need help. This adult should keep you company at home for at least 6 hours after the procedure. . Blood Pressure Medicine: Take your blood pressure medicine with a sip of water the morning of the procedure. . Blood thinners:  . Diabetics on insulin: Notify the staff so that you can be scheduled 1st case in the morning. If your diabetes requires high dose insulin, take only  of your normal insulin dose the morning of the procedure and notify the staff that you have done so. . Preventing infections: Shower with an antibacterial soap the morning of your procedure. . Build-up your immune system: Take 1000 mg of Vitamin C with every meal (3 times a day) the day prior to your procedure. Marland Kitchen Antibiotics: Inform the staff if you have a condition or reason that requires you to take antibiotics before dental procedures. . Pregnancy: If you are pregnant, call and cancel the procedure. . Sickness: If you have a cold, fever, or any active infections, call and cancel the procedure. . Arrival: You must be in the facility at least 30 minutes prior to your scheduled procedure. . Children: Do not bring children with you. . Dress appropriately: Bring dark clothing that you would not mind if they get stained. . Valuables: Do not bring any jewelry or valuables.  Procedure appointments are reserved for interventional treatments only. Marland Kitchen No Prescription  Refills. . No medication changes will be discussed during procedure appointments. . No disability issues will be discussed.  Remember:  Regular Business hours are:  Monday to Thursday 8:00 AM to 4:00 PM  Provider's Schedule: Milinda Pointer, MD:  Procedure days: Tuesday and Thursday 7:30 AM to 4:00 PM  Gillis Santa, MD:  Procedure days: Monday and Wednesday 7:30 AM to 4:00 PM ____________________________________________________________________________________________  ____________________________________________________________________________________________  Preparing for your procedure (without sedation)  Instructions: . Oral Intake: Do not eat or drink anything for at least 3 hours prior to your procedure. . Transportation: Unless otherwise stated by your physician, you may drive yourself after the procedure. . Blood Pressure Medicine: Take your blood pressure medicine with a sip of water the morning of the procedure. . Blood thinners:  . Diabetics on insulin: Notify the staff so that you can be scheduled 1st case in the morning. If your diabetes requires high dose insulin, take only  of your normal insulin dose the morning of the procedure and notify the staff that you have done so. . Preventing infections: Shower with an antibacterial soap the morning of your procedure.  . Build-up your immune system: Take 1000 mg of Vitamin C with every meal (3 times a day) the day prior to your procedure. Marland Kitchen Antibiotics: Inform the staff if you have a condition or reason that requires you to take antibiotics before dental procedures. . Pregnancy: If you are pregnant, call and cancel the procedure. . Sickness: If you have a cold, fever, or any active infections, call and cancel the procedure. . Arrival: You must be in the facility at least 30 minutes prior to your  scheduled procedure. . Children: Do not bring any children with you. . Dress appropriately: Bring dark clothing that you would  not mind if they get stained. . Valuables: Do not bring any jewelry or valuables.  Procedure appointments are reserved for interventional treatments only. Marland Kitchen No Prescription Refills. . No medication changes will be discussed during procedure appointments. . No disability issues will be discussed.  Remember:  Regular Business hours are:  Monday to Thursday 8:00 AM to 4:00 PM  Provider's Schedule: Milinda Pointer, MD:  Procedure days: Tuesday and Thursday 7:30 AM to 4:00 PM  Gillis Santa, MD:  Procedure days: Monday and Wednesday 7:30 AM to 4:00 PM ____________________________________________________________________________________________  ____________________________________________________________________________________________  General Risks and Possible Complications  Patient Responsibilities: It is important that you read this as it is part of your informed consent. It is our duty to inform you of the risks and possible complications associated with treatments offered to you. It is your responsibility as a patient to read this and to ask questions about anything that is not clear or that you believe was not covered in this document.  Patient's Rights: You have the right to refuse treatment. You also have the right to change your mind, even after initially having agreed to have the treatment done. However, under this last option, if you wait until the last second to change your mind, you may be charged for the materials used up to that point.  Introduction: Medicine is not an Chief Strategy Officer. Everything in Medicine, including the lack of treatment(s), carries the potential for danger, harm, or loss (which is by definition: Risk). In Medicine, a complication is a secondary problem, condition, or disease that can aggravate an already existing one. All treatments carry the risk of possible complications. The fact that a side effects or complications occurs, does not imply that the  treatment was conducted incorrectly. It must be clearly understood that these can happen even when everything is done following the highest safety standards.  No treatment: You can choose not to proceed with the proposed treatment alternative. The "PRO(s)" would include: avoiding the risk of complications associated with the therapy. The "CON(s)" would include: not getting any of the treatment benefits. These benefits fall under one of three categories: diagnostic; therapeutic; and/or palliative. Diagnostic benefits include: getting information which can ultimately lead to improvement of the disease or symptom(s). Therapeutic benefits are those associated with the successful treatment of the disease. Finally, palliative benefits are those related to the decrease of the primary symptoms, without necessarily curing the condition (example: decreasing the pain from a flare-up of a chronic condition, such as incurable terminal cancer).  General Risks and Complications: These are associated to most interventional treatments. They can occur alone, or in combination. They fall under one of the following six (6) categories: no benefit or worsening of symptoms; bleeding; infection; nerve damage; allergic reactions; and/or death. 1. No benefits or worsening of symptoms: In Medicine there are no guarantees, only probabilities. No healthcare provider can ever guarantee that a medical treatment will work, they can only state the probability that it may. Furthermore, there is always the possibility that the condition may worsen, either directly, or indirectly, as a consequence of the treatment. 2. Bleeding: This is more common if the patient is taking a blood thinner, either prescription or over the counter (example: Goody Powders, Fish oil, Aspirin, Garlic, etc.), or if suffering a condition associated with impaired coagulation (example: Hemophilia, cirrhosis of the liver, low platelet counts, etc.). However,  even if you do  not have one on these, it can still happen. If you have any of these conditions, or take one of these drugs, make sure to notify your treating physician. 3. Infection: This is more common in patients with a compromised immune system, either due to disease (example: diabetes, cancer, human immunodeficiency virus [HIV], etc.), or due to medications or treatments (example: therapies used to treat cancer and rheumatological diseases). However, even if you do not have one on these, it can still happen. If you have any of these conditions, or take one of these drugs, make sure to notify your treating physician. 4. Nerve Damage: This is more common when the treatment is an invasive one, but it can also happen with the use of medications, such as those used in the treatment of cancer. The damage can occur to small secondary nerves, or to large primary ones, such as those in the spinal cord and brain. This damage may be temporary or permanent and it may lead to impairments that can range from temporary numbness to permanent paralysis and/or brain death. 5. Allergic Reactions: Any time a substance or material comes in contact with our body, there is the possibility of an allergic reaction. These can range from a mild skin rash (contact dermatitis) to a severe systemic reaction (anaphylactic reaction), which can result in death. 6. Death: In general, any medical intervention can result in death, most of the time due to an unforeseen complication. ____________________________________________________________________________________________

## 2018-03-27 NOTE — Progress Notes (Signed)
Patient's Name: Ricardo Winters.  MRN: [id number]  Referring Provider: Rusty Aus, MD  DOB: [birth year]  PCP: Rusty Aus, MD  DOS: 03/27/2018  Note by: Gillis Santa, MD  Service setting: Ambulatory outpatient  Specialty: Interventional Pain Management  Location: [facility] (AMB) Pain Management Facility  Visit type: Initial Patient Evaluation  Patient type: New Patient   Primary Reason(s) for Visit: Encounter for initial evaluation of one or more chronic problems (new to examiner) potentially causing chronic pain, and posing a threat to normal musculoskeletal function. (Level of risk: High) CC: New Patient (Initial Visit)  HPI  Ricardo Winters is a 72 y.o. year old, male patient, who comes today to see [place] for the first time for an initial evaluation of his chronic pain. He has Syncope; Elevated troponin; CAD (coronary artery disease); HTN (hypertension); Primary osteoarthritis of left hip; Status post total replacement of right hip; and Chronic left hip pain on their problem list. Today he comes in for evaluation of his New Patient (Initial Visit)  Pain Assessment: Location: Left Hip(Left knee as well) Radiating: Denies  Onset: More than a month ago Duration: Chronic pain Quality: (Agonizing) Severity: 5 /10 (subjective, self-reported pain score)  Note: Reported level is compatible with observation.                         When using our objective Pain Scale, levels between 6 and 10/10 are said to belong in an emergency room, as it progressively worsens from a 6/10, described as severely limiting, requiring emergency care not usually available at an outpatient pain management facility. At a 6/10 level, communication becomes difficult and requires great effort. Assistance to reach the emergency department may be required. Facial flushing and profuse sweating along with potentially dangerous increases in heart rate and blood pressure will be evident. Effect on ADL: Effects with golfing  Timing:  Intermittent Modifying factors: Stretching, cold packs, aleve BP: (!) 171/95  HR: (!) 53  Onset and Duration: Gradual Cause of pain: Arthritis Severity: Getting worse Timing: After activity or exercise Aggravating Factors: Motion, Twisting and Walking Alleviating Factors: Stretching, Cold packs and Medications (Aleve) Associated Problems: Personality changes Quality of Pain: Agonizing Previous Examinations or Tests: MRI scan, X-rays and Orthopedic evaluation Previous Treatments: Chiropractic manipulations, Physical Therapy, Stretching exercises and Trigger point injections  The patient comes into the clinics today for the first time for a chronic pain management evaluation.   72 year old male with a chief complaint of left hip pain and left knee pain.  Patient's left hip pain is secondary to left hip osteoarthritis and possible left piriformis syndrome.  Patient is status post left piriformis injection under ultrasound guidance with Dr. Candelaria Stagers in early May which provided him with moderate to significant pain relief for approximately 4 to 5 days with a return of his pain thereafter.  Patient is status post right hip replacement in October 2018.  Patient is an avid golfer and states that his hip pain is exacerbated with golf and certain twisting movements.  In regards to his left knee pain, this is secondary to left knee osteoarthritis.  He has had Synvisc injections with Dr. Rudene Christians in early May which were not effective for his left knee pain.  Patient is also had stem cell therapy in his left knee for left knee osteoarthritis done at [facility] which was not helpful.  In regards to his left knee pain, it is more pronounced towards the medial aspect.  He has had a partial meniscus surgery in the past which was not very effective for his pain.  Patient's preference is to avoid opioid therapy.  We will focus on interventional pain management.  Today I took the time to provide the patient with information  regarding my pain practice. The patient was informed that my practice is divided into two sections: an interventional pain management section, as well as a completely separate and distinct medication management section. I explained that I have procedure days for my interventional therapies, and evaluation days for follow-ups and medication management. Because of the amount of documentation required during both, they are kept separated. This means that there is the possibility that he may be scheduled for a procedure on one day, and medication management the next. I have also informed him that because of staffing and facility limitations, I no longer take patients for medication management only. To illustrate the reasons for this, I gave the patient the example of surgeons, and how inappropriate it would be to refer a patient to his/her care, just to write for the post-surgical antibiotics on a surgery done by a different surgeon.   Because interventional pain management is my board-certified specialty, the patient was informed that joining my practice means that they are open to any and all interventional therapies. I made it clear that this does not mean that they will be forced to have any procedures done. What this means is that I believe interventional therapies to be essential part of the diagnosis and proper management of chronic pain conditions. Therefore, patients not interested in these interventional alternatives will be better served under the care of a different practitioner.  The patient was also made aware of my Comprehensive Pain Management Safety Guidelines where by joining my practice, they limit all of their nerve blocks and joint injections to those done by our practice, for as long as we are retained to manage their care.   Historic Controlled Substance Pharmacotherapy Review   [place] PMP: Six (6) year initial data search conducted.             [place] Department of public safety, offender  search: Editor, commissioning Information) Non-contributory Risk Assessment Profile: Aberrant behavior: None observed or detected today Risk factors for fatal opioid overdose: None identified today Fatal overdose hazard ratio (HR): Calculation deferred Non-fatal overdose hazard ratio (HR): Calculation deferred Risk of opioid abuse or dependence: 0.7-3.0% with doses ? 36 MME/day and 6.1-26% with doses ? 120 MME/day. Substance use disorder (SUD) risk level: Low Opioid risk tool (ORT) (Total Score): 3 Opioid Risk Tool - 03/27/18 0900      Family History of Substance Abuse   Alcohol  Positive Male    Illegal Drugs  Negative    Rx Drugs  Negative      Personal History of Substance Abuse   Alcohol  Negative    Illegal Drugs  Negative    Rx Drugs  Negative      Age   Age between 40-45 years   No      History of Preadolescent Sexual Abuse   History of Preadolescent Sexual Abuse  Negative or Male      Psychological Disease   Psychological Disease  Negative    Depression  Negative      Total Score   Opioid Risk Tool Scoring  3    Opioid Risk Interpretation  Low Risk      ORT Scoring interpretation table:  Score <3 = Low Risk for  SUD  Score between 4-7 = Moderate Risk for SUD  Score >8 = High Risk for Opioid Abuse   PHQ-2 Depression Scale:  Total score: 0  PHQ-2 Scoring interpretation table: (Score and probability of major depressive disorder)  Score 0 = No depression  Score 1 = 15.4% Probability  Score 2 = 21.1% Probability  Score 3 = 38.4% Probability  Score 4 = 45.5% Probability  Score 5 = 56.4% Probability  Score 6 = 78.6% Probability   PHQ-9 Depression Scale:  Total score: 0  PHQ-9 Scoring interpretation table:  Score 0-4 = No depression  Score 5-9 = Mild depression  Score 10-14 = Moderate depression  Score 15-19 = Moderately severe depression  Score 20-27 = Severe depression (2.4 times higher risk of SUD and 2.89 times higher risk of overuse)   Pharmacologic Plan: No opioid  analgesics.            Initial impression: The patient indicated having no interest, at this time.  Meds   Current Outpatient Medications:  .  aspirin 325 MG EC tablet, Take 325 mg by mouth daily., Disp: , Rfl:  .  Coenzyme Q10 (COQ10) 100 MG CAPS, Take by mouth., Disp: , Rfl:  .  COLLAGEN PO, Take 1 Scoop by mouth., Disp: , Rfl:  .  nitroGLYCERIN (NITROSTAT) 0.4 MG SL tablet, Place 0.4 mg under the tongue every 5 (five) minutes as needed for chest pain. Reported on 12/12/2015, Disp: , Rfl:  .  pravastatin (PRAVACHOL) 20 MG tablet, Take 20 mg by mouth daily., Disp: , Rfl:  .  sildenafil (REVATIO) 20 MG tablet, Take 20 mg by mouth daily as needed (ED). , Disp: , Rfl:  .  TURMERIC PO, Take 900 mg by mouth daily., Disp: , Rfl:  .  vitamin B-12 (CYANOCOBALAMIN) 500 MCG tablet, Take 500 mcg by mouth daily., Disp: , Rfl:   Imaging Review   Comparison: Straight the right hip from December 31, 2016.  Findings/Impression:  Right hip: Total right hip arthroplasty with stable alignment. No perihardware fracture or evidence of loosening. No subsidence. Slightly progressed heterotopic bone formation about the right hip.  Left hip: Mild superior/lateral joint space narrowing with subchondral cystic/sclerotic changes. There is prominent osteophyte formation seen about the acetabulum and the femoral head/neck junction circumferentially. Os acetabuli.     Electronically Signed IE:PPIRJ Gypsy Balsam, MD, [facility] Electronically Signed on:07/09/2017 3:54 PM  Other Result Information  Interface, Rad Results In - 07/09/2017  3:55 PM EDT XR HIP BILAT 4 VIEWS WITHOUT PELVIS  Indication:  hip pain, M25.551 Pain in right hip.  Comparison: Straight the right hip from December 31, 2016.  Findings/Impression:  Right hip: Total right hip arthroplasty with stable alignment. No perihardware fracture or evidence of loosening. No subsidence. Slightly progressed heterotopic bone formation about the right  hip.  Left hip: Mild superior/lateral joint space narrowing with subchondral cystic/sclerotic changes. There is prominent osteophyte formation seen about the acetabulum and the femoral head/neck junction circumferentially. Os acetabuli.     Electronically Signed by:  Deborha Payment, MD, [facility] Electronically Signed on:  07/09/2017 3:54 PM   X-ray knee left 3 views6/08/2017 [place] Other Result Information  This result has an attachment that is not available.  Result Narrative  AP lateral standing and sunrise views of the left knee ordered and  interpreted. Standing view shows complete loss of medial joint space with a large  defect in the medial femoral condyle.Lateral view shows spurring  posteriorly  on tibia and femur.There is also significant patellofemoral  spurring.Sunrise view confirms significant patellofemoral joint space  narrowing. Impression advanced medial compartment osteoarthritis mild to moderate  patellofemoral and lateral compartment osteoarthritis      Complexity Note: Imaging results reviewed. Results shared with Mr. Duley, using Layman's terms.                         ROS  Cardiovascular: Heart attack ( Date: 08/02/2014) Pulmonary or Respiratory: No reported pulmonary signs or symptoms such as wheezing and difficulty taking a deep full breath (Asthma), difficulty blowing air out (Emphysema), coughing up mucus (Bronchitis), persistent dry cough, or temporary stoppage of breathing during sleep Neurological: No reported neurological signs or symptoms such as seizures, abnormal skin sensations, urinary and/or fecal incontinence, being born with an abnormal open spine and/or a tethered spinal cord Review of Past Neurological Studies: No results found for this or any previous visit. Psychological-Psychiatric: No reported psychological or psychiatric signs or symptoms such as difficulty sleeping, anxiety, depression, delusions or  hallucinations (schizophrenial), mood swings (bipolar disorders) or suicidal ideations or attempts Gastrointestinal: No reported gastrointestinal signs or symptoms such as vomiting or evacuating blood, reflux, heartburn, alternating episodes of diarrhea and constipation, inflamed or scarred liver, or pancreas or irrregular and/or infrequent bowel movements Genitourinary: No reported renal or genitourinary signs or symptoms such as difficulty voiding or producing urine, peeing blood, non-functioning kidney, kidney stones, difficulty emptying the bladder, difficulty controlling the flow of urine, or chronic kidney disease Hematological: Brusing easily Endocrine: No reported endocrine signs or symptoms such as high or low blood sugar, rapid heart rate due to high thyroid levels, obesity or weight gain due to slow thyroid or thyroid disease Rheumatologic: Joint aches and or swelling due to excess weight (Osteoarthritis) Musculoskeletal: Negative for myasthenia gravis, muscular dystrophy, multiple sclerosis or malignant hyperthermia  OTHER: Nose Skin Caner  Work History: Working full time  Allergies  Mr. Hinch has No Known Allergies.  Laboratory Chemistry  Inflammation Markers (CRP: Acute Phase) (ESR: Chronic Phase) No results found for: CRP, ESRSEDRATE, LATICACIDVEN                       Rheumatology Markers No results found for: RF, ANA, LABURIC, URICUR, LYMEIGGIGMAB, LYMEABIGMQN, HLAB27                      Renal Function Markers Lab Results  Component Value Date   BUN 18 03/10/2018   CREATININE 1.14 03/10/2018   GFRAA >60 03/10/2018   GFRNONAA >60 03/10/2018                              Hepatic Function Markers Lab Results  Component Value Date   AST 18 03/10/2018   ALT 16 (L) 03/10/2018   ALBUMIN 3.2 (L) 03/10/2018   ALKPHOS 57 03/10/2018                        Electrolytes Lab Results  Component Value Date   NA 140 03/10/2018   K 3.9 03/10/2018   CL 108 03/10/2018    CALCIUM 8.6 (L) 03/10/2018                        Neuropathy Markers Lab Results  Component Value Date   HGBA1C 5.4 08/02/2014  Bone Pathology Markers No results found for: Marveen Reeks, [id number], [id number], 25OHVITD1, 25OHVITD2, 25OHVITD3, TESTOFREE, TESTOSTERONE                       Coagulation Parameters Lab Results  Component Value Date   INR 0.9 08/02/2014   LABPROT 12.2 08/02/2014   APTT 28.5 08/02/2014   PLT 141 (L) 03/10/2018                        Cardiovascular Markers Lab Results  Component Value Date   CKTOTAL 298 08/03/2014   CKMB 42.1 (H) 08/02/2014   TROPONINI 0.03 (HH) 03/10/2018   HGB 13.9 03/10/2018   HCT 39.5 (L) 03/10/2018                         CA Markers No results found for: CEA, CA125, LABCA2                      Note: Lab results reviewed.  PFSH  Drug: Mr. Mcfann  reports that he does not use drugs. Alcohol:  reports that he drinks about 2.4 oz of alcohol per week. Tobacco:  reports that he has never smoked. He has never used smokeless tobacco. Medical:  has a past medical history of Anginal pain ([place]), Cancer ([place]), Coronary artery disease, Edema, Hypertension, Myocardial infarction ([place]), and Obesity. Family: family history is not on file.  Past Surgical History:  Procedure Laterality Date  . COLONOSCOPY WITH PROPOFOL N/A 12/12/2015   Procedure: COLONOSCOPY WITH PROPOFOL;  Surgeon: [name], MD;  Location: [facility] ENDOSCOPY;  Service: Endoscopy;  Laterality: N/A;  . SHOULDER ACROMIOPLASTY     Active Ambulatory Problems    Diagnosis Date Noted  . Syncope 03/09/2018  . Elevated troponin 03/09/2018  . CAD (coronary artery disease) 03/09/2018  . HTN (hypertension) 03/09/2018  . Primary osteoarthritis of left hip 04/19/2016  . 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 Chronic left hip pain 07/08/2017   Resolved Ambulatory Problems    Diagnosis Date Noted  . No Resolved Ambulatory  Problems   Past Medical History:  Diagnosis Date  . Anginal pain ([place])   . Cancer ([place])   . Coronary artery disease   . Edema   . Hypertension   . Myocardial infarction ([place])   . Obesity    Constitutional Exam  General appearance: Well nourished, well developed, and well hydrated. In no apparent acute distress Vitals:   03/27/18 0856 03/27/18 0857  BP: (!) 171/83 (!) 171/95  Pulse: (!) 52 (!) 53  Resp: 16   Temp: 98.2 F (36.8 C)   TempSrc: Oral   SpO2: 100%   Weight: 239 lb (108.4 kg)   Height: '6\' 2"'$  (1.88 m)    BMI Assessment: Estimated body mass index is 30.69 kg/m as calculated from the following:   Height as of this encounter: '6\' 2"'$  (1.88 m).   Weight as of this encounter: 239 lb (108.4 kg).  BMI interpretation table: BMI level Category Range association with higher incidence of chronic pain  <18 kg/m2 Underweight   18.5-24.9 kg/m2 Ideal body weight   25-29.9 kg/m2 Overweight Increased incidence by 20%  30-34.9 kg/m2 Obese (Class I) Increased incidence by 68%  35-39.9 kg/m2 Severe obesity (Class II) Increased incidence by 136%  >40 kg/m2 Extreme obesity (Class III) Increased incidence by 254%   Patient's current BMI Ideal Body weight  Body mass index is 30.69 kg/m. Ideal body weight: 82.2 kg (181 lb 3.5 oz) Adjusted ideal body weight: 92.7 kg (204 lb 5.3 oz)   BMI Readings from Last 4 Encounters:  03/27/18 30.69 kg/m  03/10/18 30.49 kg/m  03/18/17 29.53 kg/m  12/12/15 28.63 kg/m   Wt Readings from Last 4 Encounters:  03/27/18 239 lb (108.4 kg)  03/10/18 237 lb 8 oz (107.7 kg)  03/18/17 230 lb (104.3 kg)  12/12/15 223 lb (101.2 kg)  Psych/Mental status: Alert, oriented x 3 (person, place, & time)       Eyes: PERLA Respiratory: No evidence of acute respiratory distress  Cervical Spine Area Exam  Skin & Axial Inspection: No masses, redness, edema, swelling, or associated skin lesions Alignment: Symmetrical Functional ROM: Unrestricted ROM       Stability: No instability detected Muscle Tone/Strength: Functionally intact. No obvious neuro-muscular anomalies detected. Sensory (Neurological): Unimpaired Palpation: No palpable anomalies              Upper Extremity (UE) Exam    Side: Right upper extremity  Side: Left upper extremity  Skin & Extremity Inspection: Skin color, temperature, and hair growth are WNL. No peripheral edema or cyanosis. No masses, redness, swelling, asymmetry, or associated skin lesions. No contractures.  Skin & Extremity Inspection: Skin color, temperature, and hair growth are WNL. No peripheral edema or cyanosis. No masses, redness, swelling, asymmetry, or associated skin lesions. No contractures.  Functional ROM: Unrestricted ROM          Functional ROM: Unrestricted ROM          Muscle Tone/Strength: Functionally intact. No obvious neuro-muscular anomalies detected.  Muscle Tone/Strength: Functionally intact. No obvious neuro-muscular anomalies detected.  Sensory (Neurological): Unimpaired          Sensory (Neurological): Unimpaired          Palpation: No palpable anomalies              Palpation: No palpable anomalies              Provocative Test(s):  Phalen's test: deferred Tinel's test: deferred Apley's scratch test (touch opposite shoulder):  Action 1 (Across chest): deferred Action 2 (Overhead): deferred Action 3 (LB reach): deferred   Provocative Test(s):  Phalen's test: deferred Tinel's test: deferred Apley's scratch test (touch opposite shoulder):  Action 1 (Across chest): deferred Action 2 (Overhead): deferred Action 3 (LB reach): deferred    Thoracic Spine Area Exam  Skin & Axial Inspection: No masses, redness, or swelling Alignment: Symmetrical Functional ROM: Unrestricted ROM Stability: No instability detected Muscle Tone/Strength: Functionally intact. No obvious neuro-muscular anomalies detected. Sensory (Neurological): Unimpaired Muscle strength & Tone: No palpable  anomalies  Lumbar Spine Area Exam  Skin & Axial Inspection: No masses, redness, or swelling Alignment: Symmetrical Functional ROM: Unrestricted ROM       Stability: No instability detected Muscle Tone/Strength: Functionally intact. No obvious neuro-muscular anomalies detected. Sensory (Neurological): Unimpaired Palpation: No palpable anomalies       Provocative Tests: Lumbar Hyperextension/rotation test: deferred today       Lumbar quadrant test (Kemp's test): deferred today       Lumbar Lateral bending test: (+) due to pain. on the left Patrick's Maneuver: Non-diagnostic for left-sided S-I arthralgia             FABER test: Non-diagnostic for left-sided S-I arthralgia Thigh-thrust test: deferred today       S-I compression test: deferred today       S-I distraction  test: deferred today        Gait & Posture Assessment  Ambulation: Unassisted Gait: Relatively normal for age and body habitus Posture: WNL   Lower Extremity Exam    Side: Right lower extremity  Side: Left lower extremity  Stability: No instability observed          Stability: No instability observed          Skin & Extremity Inspection: Skin color, temperature, and hair growth are WNL. No peripheral edema or cyanosis. No masses, redness, swelling, asymmetry, or associated skin lesions. No contractures.  Skin & Extremity Inspection: Skin color, temperature, and hair growth are WNL. No peripheral edema or cyanosis. No masses, redness, swelling, asymmetry, or associated skin lesions. No contractures.  Functional ROM: Unrestricted ROM                  Functional ROM: Unrestricted ROM                  Muscle Tone/Strength: Functionally intact. No obvious neuro-muscular anomalies detected.  Muscle Tone/Strength: Functionally intact. No obvious neuro-muscular anomalies detected.  Sensory (Neurological): Unimpaired  Sensory (Neurological): Unimpaired  Palpation: No palpable anomalies  Palpation: No palpable anomalies    Assessment  Primary Diagnosis & Pertinent Problem List: The primary encounter diagnosis was Primary osteoarthritis of left knee. Diagnoses of Primary osteoarthritis of left hip, Status post total replacement of right hip, Chronic left hip pain, and Chronic pain syndrome were also pertinent to this visit.  Visit Diagnosis (New problems to examiner): 1. Primary osteoarthritis of left knee   2. Primary osteoarthritis of left hip   3. Status post total replacement of right hip   4. Chronic left hip pain   5. Chronic pain syndrome   General Recommendations: The pain condition that the patient suffers from is best treated with a multidisciplinary approach that involves an increase in physical activity to prevent de-conditioning and worsening of the pain cycle, as well as psychological counseling (formal and/or informal) to address the co-morbid psychological affects of pain. Treatment will often involve judicious use of pain medications and interventional procedures to decrease the pain, allowing the patient to participate in the physical activity that will ultimately produce long-lasting pain reductions. The goal of the multidisciplinary approach is to return the patient to a higher level of overall function and to restore their ability to perform activities of daily living.  72 year old male with a chief complaint of left hip pain and left knee pain.  Patient's left hip pain is secondary to left hip osteoarthritis and possible left piriformis syndrome.  Patient is status post left piriformis injection under ultrasound guidance with Dr. Candelaria Stagers in early May which provided him with moderate to significant pain relief for approximately 4 to 5 days with a return of his pain thereafter.  Patient is status post right hip replacement in October 2018.  Patient is an avid golfer and states that his hip pain is exacerbated with golf and certain twisting movements.  In regards to his left knee pain, this is  secondary to left knee osteoarthritis.  He has had Synvisc injections with Dr. Rudene Christians in early May which were not effective for his left knee pain.  Patient is also had stem cell therapy in his left knee for left knee osteoarthritis done at [facility] which was not helpful.  In regards to his left knee pain, it is more pronounced towards the medial aspect.  He has had a partial meniscus surgery in the  past which was not very effective for his pain.  Patient's preference is to avoid opioid therapy.  We will focus on interventional pain management.  I will obtain x-rays of bilateral SI joints to further evaluate the patient's hip, groin, buttock pain which could be stemming from SI joint pathology.  We also discussed left knee genicular nerve block, diagnostic for the patient's left knee pain.  Risks and benefits of the procedure were discussed.  Patient would like to proceed.  Plan: -Bilateral SI joint x-rays -Left knee genicular nerve block under fluoroscopy with moderate sedation.  Ordered Lab-work, Procedure(s), Referral(s), & Consult(s): Orders Placed This Encounter  Procedures  . GENICULAR NERVE BLOCK  . DG Si Joints    Interventional management options: Mr. Woolever was informed that there is no guarantee that he would be a candidate for interventional therapies. The decision will be based on the results of diagnostic studies, as well as Mr. Seifer risk profile.  Procedure(s) under consideration:  Left knee genicular nerve block with possible radiofrequency ablation Bilateral SI joint injection Left piriformis injection under fluoroscopy   Provider-requested follow-up: Return in about 2 weeks (around 04/09/2018) for Procedure.  No future appointments.  Primary Care Physician: Rusty Aus, MD Location: [facility] Note by: Gillis Santa, M.D, Date: 03/27/2018; Time: 11:09 AM  Patient Instructions   _______________________________________________________________________________Follow up with x-ray of SI joints and procedure Genicular Nerve Block. _____________  Preparing for Procedure with Sedation  Instructions: . Oral Intake: Do not eat or drink anything for at least 8 hours prior to your procedure. . Transportation: Public transportation is not allowed. Bring an adult driver. The driver must be physically present in our waiting room before any procedure can be started. Marland Kitchen Physical Assistance: Bring an adult physically capable of assisting you, in the event you need help. This adult should keep you company at home for at least 6 hours after the procedure. . Blood Pressure Medicine: Take your blood pressure medicine with a sip of water the morning of the procedure. . Blood thinners:  . Diabetics on insulin: Notify the staff so that you can be scheduled 1st case in the morning. If your diabetes requires high dose insulin, take only  of your normal insulin dose the morning of the procedure and notify the staff that you have done so. . Preventing infections: Shower with an antibacterial soap the morning of your procedure. . Build-up your immune system: Take 1000 mg of Vitamin C with every meal (3 times a day) the day prior to your procedure. Marland Kitchen Antibiotics: Inform the staff if you have a condition or reason that requires you to take antibiotics before dental procedures. . Pregnancy: If you are pregnant, call and cancel the procedure. . Sickness: If you have a cold, fever, or any active infections, call and cancel the procedure. . Arrival: You must be in the facility at least 30 minutes prior to your scheduled procedure. . Children: Do not bring children with you. . Dress appropriately: Bring dark clothing that you would not mind if they get stained. . Valuables: Do not bring any jewelry or valuables.  Procedure appointments are reserved for interventional treatments only. Marland Kitchen No Prescription  Refills. . No medication changes will be discussed during procedure appointments. . No disability issues will be discussed.  Remember:  Regular Business hours are:  Monday to Thursday 8:00 AM to 4:00 PM  Provider's Schedule: Milinda Pointer, MD:  Procedure days: Tuesday and Thursday 7:30 AM to 4:00 PM  Gillis Santa, MD:  Procedure days: Monday and Wednesday 7:30 AM to 4:00 PM ____________________________________________________________________________________________  ____________________________________________________________________________________________  Preparing for your procedure (without sedation)  Instructions: . Oral Intake: Do not eat or drink anything for at least 3 hours prior to your procedure. . Transportation: Unless otherwise stated by your physician, you may drive yourself after the procedure. . Blood Pressure Medicine: Take your blood pressure medicine with a sip of water the morning of the procedure. . Blood thinners:  . Diabetics on insulin: Notify the staff so that you can be scheduled 1st case in the morning. If your diabetes requires high dose insulin, take only  of your normal insulin dose the morning of the procedure and notify the staff that you have done so. . Preventing infections: Shower with an antibacterial soap the morning of your procedure.  . Build-up your immune system: Take 1000 mg of Vitamin C with every meal (3 times a day) the day prior to your procedure. Marland Kitchen Antibiotics: Inform the staff if you have a condition or reason that requires you to take antibiotics before dental procedures. . Pregnancy: If you are pregnant, call and cancel the procedure. . Sickness: If you have a cold, fever, or any active infections, call and cancel the procedure. . Arrival: You must be in the facility at least 30 minutes prior to your scheduled procedure. . Children: Do not bring any children with you. . Dress appropriately: Bring dark clothing that you would  not mind if they get stained. . Valuables: Do not bring any jewelry or valuables.  Procedure appointments are reserved for interventional treatments only. Marland Kitchen No Prescription Refills. . No medication changes will be discussed during procedure appointments. . No disability issues will be discussed.  Remember:  Regular Business hours are:  Monday to Thursday 8:00 AM to 4:00 PM  Provider's Schedule: Milinda Pointer, MD:  Procedure days: Tuesday and Thursday 7:30 AM to 4:00 PM  Gillis Santa, MD:  Procedure days: Monday and Wednesday 7:30 AM to 4:00 PM ____________________________________________________________________________________________  ____________________________________________________________________________________________  General Risks and Possible Complications  Patient Responsibilities: It is important that you read this as it is part of your informed consent. It is our duty to inform you of the risks and possible complications associated with treatments offered to you. It is your responsibility as a patient to read this and to ask questions about anything that is not clear or that you believe was not covered in this document.  Patient's Rights: You have the right to refuse treatment. You also have the right to change your mind, even after initially having agreed to have the treatment done. However, under this last option, if you wait until the last second to change your mind, you may be charged for the materials used up to that point.  Introduction: Medicine is not an Chief Strategy Officer. Everything in Medicine, including the lack of treatment(s), carries the potential for danger, harm, or loss (which is by definition: Risk). In Medicine, a complication is a secondary problem, condition, or disease that can aggravate an already existing one. All treatments carry the risk of possible complications. The fact that a side effects or complications occurs, does not imply that the  treatment was conducted incorrectly. It must be clearly understood that these can happen even when everything is done following the highest safety standards.  No treatment: You can choose not to proceed with the proposed treatment alternative. The "PRO(s)" would include: avoiding the risk of complications associated with the therapy. The "CON(s)"  would include: not getting any of the treatment benefits. These benefits fall under one of three categories: diagnostic; therapeutic; and/or palliative. Diagnostic benefits include: getting information which can ultimately lead to improvement of the disease or symptom(s). Therapeutic benefits are those associated with the successful treatment of the disease. Finally, palliative benefits are those related to the decrease of the primary symptoms, without necessarily curing the condition (example: decreasing the pain from a flare-up of a chronic condition, such as incurable terminal cancer).  General Risks and Complications: These are associated to most interventional treatments. They can occur alone, or in combination. They fall under one of the following six (6) categories: no benefit or worsening of symptoms; bleeding; infection; nerve damage; allergic reactions; and/or death. 1. No benefits or worsening of symptoms: In Medicine there are no guarantees, only probabilities. No healthcare provider can ever guarantee that a medical treatment will work, they can only state the probability that it may. Furthermore, there is always the possibility that the condition may worsen, either directly, or indirectly, as a consequence of the treatment. 2. Bleeding: This is more common if the patient is taking a blood thinner, either prescription or over the counter (example: Goody Powders, Fish oil, Aspirin, Garlic, etc.), or if suffering a condition associated with impaired coagulation (example: Hemophilia, cirrhosis of the liver, low platelet counts, etc.). However, even if you do  not have one on these, it can still happen. If you have any of these conditions, or take one of these drugs, make sure to notify your treating physician. 3. Infection: This is more common in patients with a compromised immune system, either due to disease (example: diabetes, cancer, human immunodeficiency virus [HIV], etc.), or due to medications or treatments (example: therapies used to treat cancer and rheumatological diseases). However, even if you do not have one on these, it can still happen. If you have any of these conditions, or take one of these drugs, make sure to notify your treating physician. 4. Nerve Damage: This is more common when the treatment is an invasive one, but it can also happen with the use of medications, such as those used in the treatment of cancer. The damage can occur to small secondary nerves, or to large primary ones, such as those in the spinal cord and brain. This damage may be temporary or permanent and it may lead to impairments that can range from temporary numbness to permanent paralysis and/or brain death. 5. Allergic Reactions: Any time a substance or material comes in contact with our body, there is the possibility of an allergic reaction. These can range from a mild skin rash (contact dermatitis) to a severe systemic reaction (anaphylactic reaction), which can result in death. 6. Death: In general, any medical intervention can result in death, most of the time due to an unforeseen complication. ____________________________________________________________________________________________

## 2018-05-05 ENCOUNTER — Other Ambulatory Visit: Payer: Self-pay

## 2018-05-05 ENCOUNTER — Ambulatory Visit (HOSPITAL_BASED_OUTPATIENT_CLINIC_OR_DEPARTMENT_OTHER): Payer: Medicare HMO | Admitting: Student in an Organized Health Care Education/Training Program

## 2018-05-05 ENCOUNTER — Ambulatory Visit
Admission: RE | Admit: 2018-05-05 | Discharge: 2018-05-05 | Disposition: A | Discharge status: Home / Self Care | Payer: Medicare HMO | Source: Ambulatory Visit | Attending: Student in an Organized Health Care Education/Training Program | Admitting: Student in an Organized Health Care Education/Training Program

## 2018-05-05 ENCOUNTER — Encounter: Payer: Self-pay | Admitting: Student in an Organized Health Care Education/Training Program

## 2018-05-05 VITALS — BP 174/104 | HR 51 | Temp 98.0°F | Resp 15 | Ht 74.0 in | Wt 239.0 lb

## 2018-05-05 DIAGNOSIS — G894 Chronic pain syndrome: Secondary | ICD-10-CM | POA: Insufficient documentation

## 2018-05-05 DIAGNOSIS — M1612 Unilateral primary osteoarthritis, left hip: Secondary | ICD-10-CM | POA: Diagnosis present

## 2018-05-05 DIAGNOSIS — M1712 Unilateral primary osteoarthritis, left knee: Secondary | ICD-10-CM

## 2018-05-05 DIAGNOSIS — Z9889 Other specified postprocedural states: Secondary | ICD-10-CM | POA: Diagnosis not present

## 2018-05-05 MED ORDER — LACTATED RINGERS IV SOLN
1000.0000 mL | Freq: Once | INTRAVENOUS | Status: AC
  Administered 2018-05-05: 1000 mL via INTRAVENOUS

## 2018-05-05 MED ORDER — LIDOCAINE HCL (PF) 1 % IJ SOLN
10.0000 mL | Freq: Once | INTRAMUSCULAR | Status: AC
  Administered 2018-05-05: 5 mL
  Filled 2018-05-05: qty 10

## 2018-05-05 MED ORDER — FENTANYL CITRATE (PF) 100 MCG/2ML IJ SOLN
25.0000 ug | INTRAMUSCULAR | Status: DC | PRN
  Administered 2018-05-05: 25 ug via INTRAVENOUS
  Filled 2018-05-05: qty 2

## 2018-05-05 MED ORDER — DEXAMETHASONE SODIUM PHOSPHATE 10 MG/ML IJ SOLN
10.0000 mg | Freq: Once | INTRAMUSCULAR | Status: AC
  Administered 2018-05-05: 10 mg
  Filled 2018-05-05: qty 1

## 2018-05-05 MED ORDER — ROPIVACAINE HCL 2 MG/ML IJ SOLN
10.0000 mL | Freq: Once | INTRAMUSCULAR | Status: AC
  Administered 2018-05-05: 10 mL
  Filled 2018-05-05: qty 10

## 2018-05-05 NOTE — Patient Instructions (Signed)
Post-procedure Information What to expect: Most procedures involve the use of a local anesthetic (numbing medicine), and a steroid (anti-inflammatory medicine).  The local anesthetics may cause temporary numbness and weakness of the legs or arms, depending on the location of the block. This numbness/weakness may last 4-6 hours, depending on the local anesthetic used. In rare instances, it can last up to 24 hours. While numb, you must be very careful not to injure the extremity.  After any procedure, you could expect the pain to get better within 15-20 minutes. This relief is temporary and may last 4-6 hours. Once the local anesthetics wears off, you could experience discomfort, possibly more than usual, for up to 10 (ten) days. In the case of radiofrequencies, it may last up to 6 weeks. Surgeries may take up to 8 weeks for the healing process. The discomfort is due to the irritation caused by needles going through skin and muscle. To minimize the discomfort, we recommend using ice the first day, and heat from then on. The ice should be applied for 15 minutes on, and 15 minutes off. Keep repeating this cycle until bedtime. Avoid applying the ice directly to the skin, to prevent frostbite. Heat should be used daily, until the pain improves (4-10 days). Be careful not to burn yourself.  Occasionally you may experience muscle spasms or cramps. These occur as a consequence of the irritation caused by the needle sticks to the muscle and the blood that will inevitably be lost into the surrounding muscle tissue. Blood tends to be very irritating to tissues, which tend to react by going into spasm. These spasms may start the same day of your procedure, but they may also take days to develop. This late onset type of spasm or cramp is usually caused by electrolyte imbalances triggered by the steroids, at the level of the kidney. Cramps and spasms tend to respond well to muscle relaxants, multivitamins (some are  triggered by the procedure, but may have their origins in vitamin deficiencies), and "Gatorade", or any sports drinks that can replenish any electrolyte imbalances. (If you are a diabetic, ask your pharmacist to get you a sugar-free brand.) Warm showers or baths may also be helpful. Stretching exercises are highly recommended. General Instructions:  Be alert for signs of possible infection: redness, swelling, heat, red streaks, elevated temperature, and/or fever. These typically appear 4 to 6 days after the procedure. Immediately notify your doctor if you experience unusual bleeding, difficulty breathing, or loss of bowel or bladder control. If you experience increased pain, do not increase your pain medicine intake, unless instructed by your pain physician. Post-Procedure Care:  Be careful in moving about. Muscle spasms in the area of the injection may occur. Applying ice or heat to the area is often helpful. The incidence of spinal headaches after epidural injections ranges between 1.4% and 6%. If you develop a headache that does not seem to respond to conservative therapy, please let your physician know. This can be treated with an epidural blood patch.   Post-procedure numbness or redness is to be expected, however it should average 4 to 6 hours. If numbness and weakness of your extremities begins to develop 4 to 6 hours after your procedure, and is felt to be progressing and worsening, immediately contact your physician.   Diet:  If you experience nausea, do not eat until this sensation goes away. If you had a "Stellate Ganglion Block" for upper extremity "Reflex Sympathetic Dystrophy", do not eat or drink until your   hoarseness goes away. In any case, always start with liquids first and if you tolerate them well, then slowly progress to more solid foods. Activity:  For the first 4 to 6 hours after the procedure, use caution in moving about as you may experience numbness and/or weakness. Use caution in  cooking, using household electrical appliances, and climbing steps. If you need to reach your Doctor call our office: [phone] Monday-Thursday 8:00 am - 4:00 PM    Fridays: Closed     In case of an emergency: In case of emergency, call 911 or go to the nearest emergency room and have the physician there call us.  Interpretation of Procedure Every nerve block has two components: a diagnostic component, and a treatment component. Unrealistic expectations are the most common causes of "perceived failure".  In a perfect world, a single nerve block should be able to completely and permanently eliminate the pain. Sadly, the world is not perfect.  Most pain management nerve blocks are performed using local anesthetics and steroids. Steroids are responsible for any long-term benefit that you may experience. Their purpose is to decrease any chronic swelling that may exist in the area. Steroids begin to work immediately after being injected. However, most patients will not experience any benefits until 5 to 10 days after the injection, when the swelling has come down to the point where they can tell a difference. Steroids will only help if there is swelling to be treated. As such, they can assist with the diagnosis. If effective, they suggest an inflammatory component to the pain, and if ineffective, they rule out inflammation as the main cause or component of the problem. If the problem is one of mechanical compression, you will get no benefit from those steroids.   In the case of local anesthetics, they have a crucial role in the diagnosis of your condition. Most will begin to work within15 to 20 minutes after injection. The duration will depend on the type used (short- vs. Long-acting). It is of outmost importance that patients keep tract of their pain, after the procedure. To assist with this matter, a "Post-procedure Pain Diary" is provided. Make sure to complete it and to bring it back to your  follow-up appointment.  As long as the patient keeps accurate, detailed records of their symptoms after every procedure, and returns to have those interpreted, every procedure will provide us with invaluable information. Even a block that does not provide the patient with any relief, will always provide us with information about the mechanism and the origin of the pain. The only time a nerve block can be considered a waste of time is when patients do not keep track of the results, or do not keep their post-procedure appointment.  Reporting the results back to your physician The Pain Score  Pain is a subjective complaint. It cannot be seen, touched, or measured. We depend entirely on the patient's report of the pain in order to assess your condition and treatment. To evaluate the pain, we use a pain scale, where "0" means "No Pain", and a "10" is "the worst possible pain that you can even imagine" (i.e. something like been eaten alive by a shark or being torn apart by a lion).   You will frequently be asked to rate your pain. Please be as accurate, remember that medical decisions will be based on your responses. Please do not rate your pain above a 10. Doing so is actually interpreted as "symptom magnification" (exaggeration), as   well as lack of understanding with regards to the scale. To put this into perspective, when you tell us that your pain is at a 10 (ten), what you are saying is that there is nothing we can do to make this pain any worse. (Carefully think about that.)Radiofrequency Lesioning Radiofrequency lesioning is a procedure that is performed to relieve pain. The procedure is often used for back, neck, or arm pain. Radiofrequency lesioning involves the use of a machine that creates radio waves to make heat. During the procedure, the heat is applied to the nerve that carries the pain signal. The heat damages the nerve and interferes with the pain signal. Pain relief usually starts about 2 weeks  after the procedure and lasts for 6 months to 1 year. Tell a health care provider about:  Any allergies you have.  All medicines you are taking, including vitamins, herbs, eye drops, creams, and over-the-counter medicines.  Any problems you or family members have had with anesthetic medicines.  Any blood disorders you have.  Any surgeries you have had.  Any medical conditions you have.  Whether you are pregnant or may be pregnant. What are the risks? Generally, this is a safe procedure. However, problems may occur, including:  Pain or soreness at the injection site.  Infection at the injection site.  Damage to nerves or blood vessels.  What happens before the procedure?  Ask your health care provider about: ? Changing or stopping your regular medicines. This is especially important if you are taking diabetes medicines or blood thinners. ? Taking medicines such as aspirin and ibuprofen. These medicines can thin your blood. Do not take these medicines before your procedure if your health care provider instructs you not to.  Follow instructions from your health care provider about eating or drinking restrictions.  Plan to have someone take you home after the procedure.  If you go home right after the procedure, plan to have someone with you for 24 hours. What happens during the procedure?  You will be given one or more of the following: ? A medicine to help you relax (sedative). ? A medicine to numb the area (local anesthetic).  You will be awake during the procedure. You will need to be able to talk with the health care provider during the procedure.  With the help of a type of X-ray (fluoroscopy), the health care provider will insert a radiofrequency needle into the area to be treated.  Next, a wire that carries the radio waves (electrode) will be put through the radiofrequency needle. An electrical pulse will be sent through the electrode to verify the correct nerve. You  will feel a tingling sensation, and you may have muscle twitching.  Then, the tissue that is around the needle tip will be heated by an electric current that is passed using the radiofrequency machine. This will numb the nerves.  A bandage (dressing) will be put on the insertion area after the procedure is done. The procedure may vary among health care providers and hospitals. What happens after the procedure?  Your blood pressure, heart rate, breathing rate, and blood oxygen level will be monitored often until the medicines you were given have worn off.  Return to your normal activities as directed by your health care provider. This information is not intended to replace advice given to you by your health care provider. Make sure you discuss any questions you have with your health care provider. Document Released: 06/13/2011 Document Revised: 03/22/2016 Document Reviewed:   11/22/2014 [employer] Interactive Patient Education  2018 [employer]  

## 2018-05-05 NOTE — Progress Notes (Signed)
Patient's Name: Ricardo Winters.  MRN: [id number]  Referring Provider: Rusty Aus, MD  DOB: 1946-10-14  PCP: Rusty Aus, MD  DOS: 05/05/2018  Note by: Gillis Santa, MD  Service setting: Ambulatory outpatient  Specialty: Interventional Pain Management  Patient type: Established  Location: [facility] (AMB) Pain Management Facility  Visit type: Interventional Procedure   Primary Reason for Visit: Interventional Pain Management Treatment. CC: Procedure (Left knee nerve block )  Procedure:          Anesthesia, Analgesia, Anxiolysis:  Type: Genicular Nerves Block (Superior-lateral, Superior-medial, and Inferior-medial Genicular Nerves) #1  CPT: 27253      Primary Purpose: Diagnostic Region: Lateral, Anterior, and Medial aspects of the knee joint, above and below the knee joint proper. Level: Superior and inferior to the knee joint. Target Area: For Genicular Nerve block(s), the targets are: the superior-lateral genicular nerve, located in the lateral distal portion of the femoral shaft as it curves to form the lateral epicondyle, in the region of the distal femoral metaphysis; the superior-medial genicular nerve, located in the medial distal portion of the femoral shaft as it curves to form the medial epicondyle; and the inferior-medial genicular nerve, located in the medial, proximal portion of the tibial shaft, as it curves to form the medial epicondyle, in the region of the proximal tibial metaphysis. Approach: Anterior, percutaneous, ipsilateral approach. Laterality: Left knee Position: Modified Fowler's position with pillows under the targeted knee(s).  Type: Moderate (Conscious) Sedation combined with Local Anesthesia Indication(s): Analgesia and Anxiety Route: Intravenous (IV) IV Access: Secured Sedation: Meaningful verbal contact was maintained at all times during the procedure  Local Anesthetic: Lidocaine 1-2%   Indications: 1. Primary osteoarthritis of left knee   2. Primary  osteoarthritis of left hip   3. Chronic pain syndrome    Pain Score: Pre-procedure: 5 /10 Post-procedure: 0-No pain/10  Pre-op Assessment:  Ricardo Winters is a 72 y.o. (year old), male patient, seen today for interventional treatment. He  has a past surgical history that includes Shoulder acromioplasty and Colonoscopy with propofol (N/A, 12/12/2015). Ricardo Winters has a current medication list which includes the following prescription(s): aspirin, coq10, collagen, nitroglycerin, pravastatin, turmeric, vitamin b-12, and sildenafil, and the following Facility-Administered Medications: fentanyl. His primarily concern today is the Procedure (Left knee nerve block )  Initial Vital Signs:  Pulse/HCG Rate: (!) 57ECG Heart Rate: (!) 51 Temp: 98 F (36.7 C) Resp: 18 BP: (!) 181/101 SpO2: 100 %  BMI: Estimated body mass index is 30.69 kg/m as calculated from the following:   Height as of this encounter: 6\' 2"  (1.88 m).   Weight as of this encounter: 239 lb (108.4 kg).  Risk Assessment: Allergies: Reviewed. He has No Known Allergies.  Allergy Precautions: None required Coagulopathies: Reviewed. None identified.  Blood-thinner therapy: None at this time Active Infection(s): Reviewed. None identified. Ricardo Winters is afebrile  Site Confirmation: Ricardo Winters was asked to confirm the procedure and laterality before marking the site Procedure checklist: Completed Consent: Before the procedure and under the influence of no sedative(s), amnesic(s), or anxiolytics, the patient was informed of the treatment options, risks and possible complications. To fulfill our ethical and legal obligations, as recommended by the American Medical Association's Code of Ethics, I have informed the patient of my clinical impression; the nature and purpose of the treatment or procedure; the risks, benefits, and possible complications of the intervention; the alternatives, including doing nothing; the risk(s) and benefit(s) of the  alternative treatment(s) or procedure(s); and  the risk(s) and benefit(s) of doing nothing. The patient was provided information about the general risks and possible complications associated with the procedure. These may include, but are not limited to: failure to achieve desired goals, infection, bleeding, organ or nerve damage, allergic reactions, paralysis, and death. In addition, the patient was informed of those risks and complications associated to the procedure, such as failure to decrease pain; infection; bleeding; organ or nerve damage with subsequent damage to sensory, motor, and/or autonomic systems, resulting in permanent pain, numbness, and/or weakness of one or several areas of the body; allergic reactions; (i.e.: anaphylactic reaction); and/or death. Furthermore, the patient was informed of those risks and complications associated with the medications. These include, but are not limited to: allergic reactions (i.e.: anaphylactic or anaphylactoid reaction(s)); adrenal axis suppression; blood sugar elevation that in diabetics may result in ketoacidosis or comma; water retention that in patients with history of congestive heart failure may result in shortness of breath, pulmonary edema, and decompensation with resultant heart failure; weight gain; swelling or edema; medication-induced neural toxicity; particulate matter embolism and blood vessel occlusion with resultant organ, and/or nervous system infarction; and/or aseptic necrosis of one or more joints. Finally, the patient was informed that Medicine is not an exact science; therefore, there is also the possibility of unforeseen or unpredictable risks and/or possible complications that may result in a catastrophic outcome. The patient indicated having understood very clearly. We have given the patient no guarantees and we have made no promises. Enough time was given to the patient to ask questions, all of which were answered to the patient's  satisfaction. Ricardo Winters has indicated that he wanted to continue with the procedure. Attestation: I, the ordering provider, attest that I have discussed with the patient the benefits, risks, side-effects, alternatives, likelihood of achieving goals, and potential problems during recovery for the procedure that I have provided informed consent. Date  Time: 05/05/2018  8:21 AM  Pre-Procedure Preparation:  Monitoring: As per clinic protocol. Respiration, ETCO2, SpO2, BP, heart rate and rhythm monitor placed and checked for adequate function Safety Precautions: Patient was assessed for positional comfort and pressure points before starting the procedure. Time-out: I initiated and conducted the "Time-out" before starting the procedure, as per protocol. The patient was asked to participate by confirming the accuracy of the "Time Out" information. Verification of the correct person, site, and procedure were performed and confirmed by me, the nursing staff, and the patient. "Time-out" conducted as per Joint Commission's Universal Protocol (UP.01.01.01). Time: 0920  Description of Procedure:          Area Prepped: Entire knee area, from mid-thigh to mid-shin, lateral, anterior, and medial aspects. Prepping solution: ChloraPrep (2% chlorhexidine gluconate and 70% isopropyl alcohol) Safety Precautions: Aspiration looking for blood return was conducted prior to all injections. At no point did we inject any substances, as a needle was being advanced. No attempts were made at seeking any paresthesias. Safe injection practices and needle disposal techniques used. Medications properly checked for expiration dates. SDV (single dose vial) medications used. Latex Allergy precautions taken.   Description of the Procedure: Protocol guidelines were followed. The patient was placed in position over the procedure table. The target area was identified and the area prepped in the usual manner. Skin & deeper tissues infiltrated  with local anesthetic. Appropriate amount of time allowed to pass for local anesthetics to take effect. The procedure needles were then advanced to the target area. Proper needle placement secured. Negative aspiration confirmed. Solution injected  in intermittent fashion, asking for systemic symptoms every 0.5cc of injectate. The needles were then removed and the area cleansed, making sure to leave some of the prepping solution back to take advantage of its long term bactericidal properties.  Vitals:   05/05/18 0939 05/05/18 0940 05/05/18 0949 05/05/18 0959  BP: (!) 174/115 137/88 (!) 183/96 (!) 174/104  Pulse: (!) 51     Resp: 12  13 15   Temp:      TempSrc:      SpO2: 100%  98% 98%  Weight:      Height:        Start Time: 0920 hrs. End Time: 0927 hrs. Materials:  Needle(s) Type: Regular needle Gauge: 22G Length: 3.5-in Medication(s): Please see orders for medications and dosing details. 3 cc solution made of 2 cc of 0.2% ropivacaine, 1 cc of Decadron 10 milligrams per cc.1 cc injected at each level. Imaging Guidance (Non-Spinal):          Type of Imaging Technique: Fluoroscopy Guidance (Non-Spinal) Indication(s): Assistance in needle guidance and placement for procedures requiring needle placement in or near specific anatomical locations not easily accessible without such assistance. Exposure Time: Please see nurses notes. Contrast: None used. Fluoroscopic Guidance: I was personally present during the use of fluoroscopy. "Tunnel Vision Technique" used to obtain the best possible view of the target area. Parallax error corrected before commencing the procedure. "Direction-depth-direction" technique used to introduce the needle under continuous pulsed fluoroscopy. Once target was reached, antero-posterior, oblique, and lateral fluoroscopic projection used confirm needle placement in all planes. Images permanently stored in EMR. Interpretation: No contrast injected.  Antibiotic Prophylaxis:    Anti-infectives (From admission, onward)   None     Indication(s): None identified  Post-operative Assessment:  Post-procedure Vital Signs:  Pulse/HCG Rate: (!) 51(!) 56 Temp: 98 F (36.7 C) Resp: 15 BP: (!) 174/104 SpO2: 98 %  EBL: None  Complications: No immediate post-treatment complications observed by team, or reported by patient.  Note: The patient tolerated the entire procedure well. A repeat set of vitals were taken after the procedure and the patient was kept under observation following institutional policy, for this type of procedure. Post-procedural neurological assessment was performed, showing return to baseline, prior to discharge. The patient was provided with post-procedure discharge instructions, including a section on how to identify potential problems. Should any problems arise concerning this procedure, the patient was given instructions to immediately contact us, at any time, without hesitation. In any case, we plan to contact the patient by telephone for a follow-up status report regarding this interventional procedure.  Comments:  No additional relevant information. 5 out of 5 strength bilateral lower extremity: Plantar flexion, dorsiflexion, knee flexion, knee extension. Plan of Care  Patient to obtain x-rays of his bilateral SI joints. We'll review at next visit.  Imaging Orders     DG C-Arm 1-60 Min-No Report Procedure Orders    No procedure(s) ordered today    Medications ordered for procedure: Meds ordered this encounter  Medications  . lactated ringers infusion 1,000 mL  . fentaNYL (SUBLIMAZE) injection 25-100 mcg    Make sure Narcan is available in the pyxis when using this medication. In the event of respiratory depression (RR< 8/min): Titrate NARCAN (naloxone) in increments of 0.1 to 0.2 mg IV at 2-3 minute intervals, until desired degree of reversal.  . lidocaine (PF) (XYLOCAINE) 1 % injection 10 mL  . ropivacaine (PF) 2 mg/mL (0.2%)  (NAROPIN) injection 10 mL  . dexamethasone (DECADRON)  injection 10 mg   Medications administered: We administered lactated ringers, fentaNYL, lidocaine (PF), ropivacaine (PF) 2 mg/mL (0.2%), and dexamethasone.  See the medical record for exact dosing, route, and time of administration.  New Prescriptions   No medications on file   Disposition: Discharge home  Discharge Date & Time: 05/05/2018; 1001 hrs.   Physician-requested Follow-up: Return in about 1 month (around 06/02/2018) for Post Procedure Evaluation, After Imaging.  Future Appointments  Date Time Provider [place]  06/02/2018 12:30 PM Gillis Santa, MD [facility] None   Primary Care Physician: Rusty Aus, MD Location: [facility] Note by: Gillis Santa, MD Date: 05/05/2018; Time: 11:20 AM  Disclaimer:  Medicine is not an exact science. The only guarantee in medicine is that nothing is guaranteed. It is important to note that the decision to proceed with this intervention was based on the information collected from the patient. The Data and conclusions were drawn from the patient's questionnaire, the interview, and the physical examination. Because the information was provided in large part by the patient, it cannot be guaranteed that it has not been purposely or unconsciously manipulated. Every effort has been made to obtain as much relevant data as possible for this evaluation. It is important to note that the conclusions that lead to this procedure are derived in large part from the available data. Always take into account that the treatment will also be dependent on availability of resources and existing treatment guidelines, considered by other Pain Management Practitioners as being common knowledge and practice, at the time of the intervention. For Medico-Legal purposes, it is also important to point out that variation in procedural techniques and pharmacological choices are the acceptable norm.  The indications, contraindications, technique, and results of the above procedure should only be interpreted and judged by a Board-Certified Interventional Pain Specialist with extensive familiarity and expertise in the same exact procedure and technique.

## 2018-05-05 NOTE — Progress Notes (Signed)
Safety precautions to be maintained throughout the outpatient stay will include: orient to surroundings, keep bed in low position, maintain call bell within reach at all times, provide assistance with transfer out of bed and ambulation.  

## 2018-05-06 ENCOUNTER — Telehealth: Payer: Self-pay | Admitting: *Deleted

## 2018-05-06 NOTE — Telephone Encounter (Signed)
No problems post procedure. 

## 2018-05-13 ENCOUNTER — Ambulatory Visit
Admission: RE | Admit: 2018-05-13 | Discharge: 2018-05-13 | Disposition: A | Discharge status: Home / Self Care | Payer: Medicare HMO | Source: Ambulatory Visit | Attending: Student in an Organized Health Care Education/Training Program | Admitting: Student in an Organized Health Care Education/Training Program

## 2018-05-13 DIAGNOSIS — G8929 Other chronic pain: Secondary | ICD-10-CM | POA: Diagnosis not present

## 2018-05-13 DIAGNOSIS — M25552 Pain in left hip: Secondary | ICD-10-CM | POA: Insufficient documentation

## 2018-05-23 ENCOUNTER — Telehealth: Payer: Self-pay

## 2018-05-23 NOTE — Telephone Encounter (Signed)
Pt called and wanted to make Dr. Holley Raring aware that knee injections lasted good about 2weeks and now with extra activity the pain is coming back. Pt feels you got the right location but he's been playing golf and mowing so pain is starting back so he would like to discuss starting regular injection's when he comes to appt on the 5th of Aug. I told him I would make you aware. Thanks [employer]

## 2018-06-02 ENCOUNTER — Encounter: Payer: Self-pay | Admitting: Student in an Organized Health Care Education/Training Program

## 2018-06-02 ENCOUNTER — Ambulatory Visit
Payer: Medicare HMO | Attending: Student in an Organized Health Care Education/Training Program | Admitting: Student in an Organized Health Care Education/Training Program

## 2018-06-02 ENCOUNTER — Other Ambulatory Visit: Payer: Self-pay

## 2018-06-02 VITALS — BP 166/98 | HR 65 | Temp 97.9°F | Resp 16 | Ht 74.0 in | Wt 233.0 lb

## 2018-06-02 DIAGNOSIS — M47818 Spondylosis without myelopathy or radiculopathy, sacral and sacrococcygeal region: Secondary | ICD-10-CM | POA: Diagnosis not present

## 2018-06-02 DIAGNOSIS — M1712 Unilateral primary osteoarthritis, left knee: Secondary | ICD-10-CM | POA: Insufficient documentation

## 2018-06-02 DIAGNOSIS — I252 Old myocardial infarction: Secondary | ICD-10-CM | POA: Insufficient documentation

## 2018-06-02 DIAGNOSIS — E669 Obesity, unspecified: Secondary | ICD-10-CM | POA: Diagnosis not present

## 2018-06-02 DIAGNOSIS — Z6829 Body mass index (BMI) 29.0-29.9, adult: Secondary | ICD-10-CM | POA: Diagnosis not present

## 2018-06-02 DIAGNOSIS — G8929 Other chronic pain: Secondary | ICD-10-CM | POA: Diagnosis not present

## 2018-06-02 DIAGNOSIS — M1612 Unilateral primary osteoarthritis, left hip: Secondary | ICD-10-CM | POA: Insufficient documentation

## 2018-06-02 DIAGNOSIS — Z79899 Other long term (current) drug therapy: Secondary | ICD-10-CM | POA: Insufficient documentation

## 2018-06-02 DIAGNOSIS — I10 Essential (primary) hypertension: Secondary | ICD-10-CM | POA: Diagnosis not present

## 2018-06-02 DIAGNOSIS — M533 Sacrococcygeal disorders, not elsewhere classified: Secondary | ICD-10-CM | POA: Insufficient documentation

## 2018-06-02 DIAGNOSIS — Z7982 Long term (current) use of aspirin: Secondary | ICD-10-CM | POA: Diagnosis not present

## 2018-06-02 DIAGNOSIS — I2510 Atherosclerotic heart disease of native coronary artery without angina pectoris: Secondary | ICD-10-CM | POA: Insufficient documentation

## 2018-06-02 NOTE — Patient Instructions (Addendum)
Stop taking 325 mg aspiring today; begin 81 mg aspirin instead until after procedure.  GENERAL RISKS AND COMPLICATIONS  What are the risk, side effects and possible complications? Generally speaking, most procedures are safe.  However, with any procedure there are risks, side effects, and the possibility of complications.  The risks and complications are dependent upon the sites that are lesioned, or the type of nerve block to be performed.  The closer the procedure is to the spine, the more serious the risks are.  Great care is taken when placing the radio frequency needles, block needles or lesioning probes, but sometimes complications can occur. 1. Infection: Any time there is an injection through the skin, there is a risk of infection.  This is why sterile conditions are used for these blocks.  There are four possible types of infection. 1. Localized skin infection. 2. Central Nervous System Infection-This can be in the form of Meningitis, which can be deadly. 3. Epidural Infections-This can be in the form of an epidural abscess, which can cause pressure inside of the spine, causing compression of the spinal cord with subsequent paralysis. This would require an emergency surgery to decompress, and there are no guarantees that the patient would recover from the paralysis. 4. Discitis-This is an infection of the intervertebral discs.  It occurs in about 1% of discography procedures.  It is difficult to treat and it may lead to surgery.        2. Pain: the needles have to go through skin and soft tissues, will cause soreness.       3. Damage to internal structures:  The nerves to be lesioned may be near blood vessels or    other nerves which can be potentially damaged.       4. Bleeding: Bleeding is more common if the patient is taking blood thinners such as  aspirin, Coumadin, Ticiid, Plavix, etc., or if he/she have some genetic predisposition  such as hemophilia. Bleeding into the spinal canal can  cause compression of the spinal  cord with subsequent paralysis.  This would require an emergency surgery to  decompress and there are no guarantees that the patient would recover from the  paralysis.       5. Pneumothorax:  Puncturing of a lung is a possibility, every time a needle is introduced in  the area of the chest or upper back.  Pneumothorax refers to free air around the  collapsed lung(s), inside of the thoracic cavity (chest cavity).  Another two possible  complications related to a similar event would include: Hemothorax and Chylothorax.   These are variations of the Pneumothorax, where instead of air around the collapsed  lung(s), you may have blood or chyle, respectively.       6. Spinal headaches: They may occur with any procedures in the area of the spine.       7. Persistent CSF (Cerebro-Spinal Fluid) leakage: This is a rare problem, but may occur  with prolonged intrathecal or epidural catheters either due to the formation of a fistulous  track or a dural tear.       8. Nerve damage: By working so close to the spinal cord, there is always a possibility of  nerve damage, which could be as serious as a permanent spinal cord injury with  paralysis.       9. Death:  Although rare, severe deadly allergic reactions known as "Anaphylactic  reaction" can occur to any of the medications used.  10. Worsening of the symptoms:  We can always make thing worse.  What are the chances of something like this happening? Chances of any of this occuring are extremely low.  By statistics, you have more of a chance of getting killed in a motor vehicle accident: while driving to the hospital than any of the above occurring .  Nevertheless, you should be aware that they are possibilities.  In general, it is similar to taking a shower.  Everybody knows that you can slip, hit your head and get killed.  Does that mean that you should not shower again?  Nevertheless always keep in mind that statistics do not mean  anything if you happen to be on the wrong side of them.  Even if a procedure has a 1 (one) in a 1,000,000 (million) chance of going wrong, it you happen to be that one..Also, keep in mind that by statistics, you have more of a chance of having something go wrong when taking medications.  Who should not have this procedure? If you are on a blood thinning medication (e.g. Coumadin, Plavix, see list of "Blood Thinners"), or if you have an active infection going on, you should not have the procedure.  If you are taking any blood thinners, please inform your physician.  How should I prepare for this procedure?  Do not eat or drink anything at least six hours prior to the procedure.  Bring a driver with you .  It cannot be a taxi.  Come accompanied by an adult that can drive you back, and that is strong enough to help you if your legs get weak or numb from the local anesthetic.  Take all of your medicines the morning of the procedure with just enough water to swallow them.  If you have diabetes, make sure that you are scheduled to have your procedure done first thing in the morning, whenever possible.  If you have diabetes, take only half of your insulin dose and notify our nurse that you have done so as soon as you arrive at the clinic.  If you are diabetic, but only take blood sugar pills (oral hypoglycemic), then do not take them on the morning of your procedure.  You may take them after you have had the procedure.  Do not take aspirin or any aspirin-containing medications, at least eleven (11) days prior to the procedure.  They may prolong bleeding.  Wear loose fitting clothing that may be easy to take off and that you would not mind if it got stained with Betadine or blood.  Do not wear any jewelry or perfume  Remove any nail coloring.  It will interfere with some of our monitoring equipment.  NOTE: Remember that this is not meant to be interpreted as a complete list of all possible  complications.  Unforeseen problems may occur.  BLOOD THINNERS The following drugs contain aspirin or other products, which can cause increased bleeding during surgery and should not be taken for 2 weeks prior to and 1 week after surgery.  If you should need take something for relief of minor pain, you may take acetaminophen which is found in Tylenol,m Datril, Anacin-3 and Panadol. It is not blood thinner. The products listed below are.  Do not take any of the products listed below in addition to any listed on your instruction sheet.  A.P.C or A.P.C with Codeine Codeine Phosphate Capsules #3 Ibuprofen Ridaura  ABC compound Congesprin Imuran rimadil  Advil Cope Indocin Robaxisal  Alka-Seltzer Effervescent Pain Reliever and Antacid Coricidin or Coricidin-D  Indomethacin Rufen  Alka-Seltzer plus Cold Medicine Cosprin Ketoprofen S-A-C Tablets  Anacin Analgesic Tablets or Capsules Coumadin Korlgesic Salflex  Anacin Extra Strength Analgesic tablets or capsules CP-2 Tablets Lanoril Salicylate  Anaprox Cuprimine Capsules Levenox Salocol  Anexsia-D Dalteparin Magan Salsalate  Anodynos Darvon compound Magnesium Salicylate Sine-off  Ansaid Dasin Capsules Magsal Sodium Salicylate  Anturane Depen Capsules Marnal Soma  APF Arthritis pain formula Dewitt's Pills Measurin Stanback  Argesic Dia-Gesic Meclofenamic Sulfinpyrazone  Arthritis Bayer Timed Release Aspirin Diclofenac Meclomen Sulindac  Arthritis pain formula Anacin Dicumarol Medipren Supac  Analgesic (Safety coated) Arthralgen Diffunasal Mefanamic Suprofen  Arthritis Strength Bufferin Dihydrocodeine Mepro Compound Suprol  Arthropan liquid Dopirydamole Methcarbomol with Aspirin Synalgos  ASA tablets/Enseals Disalcid Micrainin Tagament  Ascriptin Doan's Midol Talwin  Ascriptin A/D Dolene Mobidin Tanderil  Ascriptin Extra Strength Dolobid Moblgesic Ticlid  Ascriptin with Codeine Doloprin or Doloprin with Codeine Momentum Tolectin  Asperbuf Duoprin  Mono-gesic Trendar  Aspergum Duradyne Motrin or Motrin IB Triminicin  Aspirin plain, buffered or enteric coated Durasal Myochrisine Trigesic  Aspirin Suppositories Easprin Nalfon Trillsate  Aspirin with Codeine Ecotrin Regular or Extra Strength Naprosyn Uracel  Atromid-S Efficin Naproxen Ursinus  Auranofin Capsules Elmiron Neocylate Vanquish  Axotal Emagrin Norgesic Verin  Azathioprine Empirin or Empirin with Codeine Normiflo Vitamin E  Azolid Emprazil Nuprin Voltaren  Bayer Aspirin plain, buffered or children's or timed BC Tablets or powders Encaprin Orgaran Warfarin Sodium  Buff-a-Comp Enoxaparin Orudis Zorpin  Buff-a-Comp with Codeine Equegesic Os-Cal-Gesic   Buffaprin Excedrin plain, buffered or Extra Strength Oxalid   Bufferin Arthritis Strength Feldene Oxphenbutazone   Bufferin plain or Extra Strength Feldene Capsules Oxycodone with Aspirin   Bufferin with Codeine Fenoprofen Fenoprofen Pabalate or Pabalate-SF   Buffets II Flogesic Panagesic   Buffinol plain or Extra Strength Florinal or Florinal with Codeine Panwarfarin   Buf-Tabs Flurbiprofen Penicillamine   Butalbital Compound Four-way cold tablets Penicillin   Butazolidin Fragmin Pepto-Bismol   Carbenicillin Geminisyn Percodan   Carna Arthritis Reliever Geopen Persantine   Carprofen Gold's salt Persistin   Chloramphenicol Goody's Phenylbutazone   Chloromycetin Haltrain Piroxlcam   Clmetidine heparin Plaquenil   Cllnoril Hyco-pap Ponstel   Clofibrate Hydroxy chloroquine Propoxyphen         Before stopping any of these medications, be sure to consult the physician who ordered them.  Some, such as Coumadin (Warfarin) are ordered to prevent or treat serious conditions such as "deep thrombosis", "pumonary embolisms", and other heart problems.  The amount of time that you may need off of the medication may also vary with the medication and the reason for which you were taking it.  If you are taking any of these medications, please  make sure you notify your pain physician before you undergo any procedures.         Sacroiliac (SI) Joint Injection Patient Information  Description: The sacroiliac joint connects the scrum (very low back and tailbone) to the ilium (a pelvic bone which also forms half of the hip joint).  Normally this joint experiences very little motion.  When this joint becomes inflamed or unstable low back and or hip and pelvis pain may result.  Injection of this joint with local anesthetics (numbing medicines) and steroids can provide diagnostic information and reduce pain.  This injection is performed with the aid of x-ray guidance into the tailbone area while you are lying on your stomach.   You may experience an electrical sensation down the leg  while this is being done.  You may also experience numbness.  We also may ask if we are reproducing your normal pain during the injection.  Conditions which may be treated SI injection:   Low back, buttock, hip or leg pain  Preparation for the Injection:  1. Do not eat any solid food or dairy products within 8 hours of your appointment.  2. You may drink clear liquids up to 3 hours before appointment.  Clear liquids include water, black coffee, juice or soda.  No milk or cream please. 3. You may take your regular medications, including pain medications with a sip of water before your appointment.  Diabetics should hold regular insulin (if take separately) and take 1/2 normal NPH dose the morning of the procedure.  Carry some sugar containing items with you to your appointment. 4. A driver must accompany you and be prepared to drive you home after your procedure. 5. Bring all of your current medications with you. 6. An IV may be inserted and sedation may be given at the discretion of the physician. 7. A blood pressure cuff, EKG and other monitors will often be applied during the procedure.  Some patients may need to have extra oxygen administered for a short  period.  8. You will be asked to provide medical information, including your allergies, prior to the procedure.  We must know immediately if you are taking blood thinners (like Coumadin/Warfarin) or if you are allergic to IV iodine contrast (dye).  We must know if you could possible be pregnant.  Possible side effects:   Bleeding from needle site  Infection (rare, may require surgery)  Nerve injury (rare)  Numbness & tingling (temporary)  A brief convulsion or seizure  Light-headedness (temporary)  Pain at injection site (several days)  Decreased blood pressure (temporary)  Weakness in the leg (temporary)   Call if you experience:   New onset weakness or numbness of an extremity below the injection site that last more than 8 hours.  Hives or difficulty breathing ( go to the emergency room)  Inflammation or drainage at the injection site  Any new symptoms which are concerning to you  Please note:  Although the local anesthetic injected can often make your back/ hip/ buttock/ leg feel good for several hours after the injections, the pain will likely return.  It takes 3-7 days for steroids to work in the sacroiliac area.  You may not notice any pain relief for at least that one week.  If effective, we will often do a series of three injections spaced 3-6 weeks apart to maximally decrease your pain.  After the initial series, we generally will wait some months before a repeat injection of the same type.  If you have any questions, please call [phone] [facility]

## 2018-06-02 NOTE — Progress Notes (Signed)
Safety precautions to be maintained throughout the outpatient stay will include: orient to surroundings, keep bed in low position, maintain call bell within reach at all times, provide assistance with transfer out of bed and ambulation.  

## 2018-06-02 NOTE — Progress Notes (Signed)
Patient's Name: Ricardo Winters.  MRN: [id number]  Referring Provider: Rusty Aus, MD  DOB: 22-Dec-1945  PCP: Rusty Aus, MD  DOS: 06/02/2018  Note by: Gillis Santa, MD  Service setting: Ambulatory outpatient  Specialty: Interventional Pain Management  Location: [facility] (AMB) Pain Management Facility    Patient type: Established   Primary Reason(s) for Visit: Encounter for post-procedure evaluation of chronic illness with mild to moderate exacerbation CC: Knee Pain (left)  HPI  Ricardo Winters is a 72 y.o. year old, male patient, who comes today for a post-procedure evaluation. He has Syncope; Elevated troponin; CAD (coronary artery disease); HTN (hypertension); Primary osteoarthritis of left hip; Status post total replacement of right hip; Chronic left hip pain; Primary osteoarthritis of left knee; Chronic left SI joint pain; and SI joint arthritis (LEFT) on their problem list. His primarily concern today is the Knee Pain (left)  Pain Assessment: Location: Left Knee Radiating: denies Onset: More than a month ago Duration: Chronic pain Quality: Aching Severity: 2 /10 (subjective, self-reported pain score)  Note: Reported level is compatible with observation.                         When using our objective Pain Scale, levels between 6 and 10/10 are said to belong in an emergency room, as it progressively worsens from a 6/10, described as severely limiting, requiring emergency care not usually available at an outpatient pain management facility. At a 6/10 level, communication becomes difficult and requires great effort. Assistance to reach the emergency department may be required. Facial flushing and profuse sweating along with potentially dangerous increases in heart rate and blood pressure will be evident. Effect on ADL:   Timing: Intermittent Modifying factors: stretching, cold packs, aleve, CBD oil BP: (!) 166/98  HR: 65  Ricardo Winters comes in today for post-procedure evaluation after the  treatment done on 05/23/2018.  Further details on both, my assessment(s), as well as the proposed treatment plan, please see below.  Post-Procedure Assessment  05/05/2018 Procedure: Left KNEE GNB Pre-procedure pain score:  5/10 Post-procedure pain score: 0/10         Influential Factors: BMI: 29.92 kg/m Intra-procedural challenges: None observed.         Assessment challenges: None detected.              Reported side-effects: None.        Post-procedural adverse reactions or complications: None reported         Sedation: Please see nurses note. When no sedatives are used, the analgesic levels obtained are directly associated to the effectiveness of the local anesthetics. However, when sedation is provided, the level of analgesia obtained during the initial 1 hour following the intervention, is believed to be the result of a combination of factors. These factors may include, but are not limited to: 1. The effectiveness of the local anesthetics used. 2. The effects of the analgesic(s) and/or anxiolytic(s) used. 3. The degree of discomfort experienced by the patient at the time of the procedure. 4. The patients ability and reliability in recalling and recording the events. 5. The presence and influence of possible secondary gains and/or psychosocial factors. Reported result: Relief experienced during the 1st hour after the procedure: 100 % (Ultra-Short Term Relief)            Interpretative annotation: Clinically appropriate result. Analgesia during this period is likely to be Local Anesthetic and/or IV Sedative (Analgesic/Anxiolytic) related.  Effects of local anesthetic: The analgesic effects attained during this period are directly associated to the localized infiltration of local anesthetics and therefore cary significant diagnostic value as to the etiological location, or anatomical origin, of the pain. Expected duration of relief is directly dependent on the pharmacodynamics of the  local anesthetic used. Long-acting (4-6 hours) anesthetics used.  Reported result: Relief during the next 4 to 6 hour after the procedure: 100 % (Short-Term Relief)            Interpretative annotation: Clinically appropriate result. Analgesia during this period is likely to be Local Anesthetic-related.          Long-term benefit: Defined as the period of time past the expected duration of local anesthetics (1 hour for short-acting and 4-6 hours for long-acting). With the possible exception of prolonged sympathetic blockade from the local anesthetics, benefits during this period are typically attributed to, or associated with, other factors such as analgesic sensory neuropraxia, antiinflammatory effects, or beneficial biochemical changes provided by agents other than the local anesthetics.  Reported result: Extended relief following procedure: 25 %(100% relief for 14 days until pt states he became "extremely physically active") (Long-Term Relief)            Interpretative annotation: Clinically possible results. Good relief. No permanent benefit expected. Inflammation plays a part in the etiology to the pain.          Current benefits: Defined as reported results that persistent at this point in time.   Analgesia: 25 %            Function: Somewhat improved ROM: Somewhat improved Interpretative annotation: Recurrence of symptoms. No permanent benefit expected. Effective diagnostic intervention.          Interpretation: Results would suggest a successful diagnostic intervention.                  Plan:  Repeat treatment or therapy and compare extent and duration of benefits.                Laboratory Chemistry  Inflammation Markers (CRP: Acute Phase) (ESR: Chronic Phase) No results found for: CRP, ESRSEDRATE, LATICACIDVEN                       Rheumatology Markers No results found for: RF, ANA, LABURIC, URICUR, LYMEIGGIGMAB, LYMEABIGMQN, HLAB27                      Renal Function Markers Lab  Results  Component Value Date   BUN 18 03/10/2018   CREATININE 1.14 03/10/2018   GFRAA >60 03/10/2018   GFRNONAA >60 03/10/2018                             Hepatic Function Markers Lab Results  Component Value Date   AST 18 03/10/2018   ALT 16 (L) 03/10/2018   ALBUMIN 3.2 (L) 03/10/2018   ALKPHOS 57 03/10/2018                        Electrolytes Lab Results  Component Value Date   NA 140 03/10/2018   K 3.9 03/10/2018   CL 108 03/10/2018   CALCIUM 8.6 (L) 03/10/2018                        Neuropathy Markers Lab Results  Component Value Date  HGBA1C 5.4 08/02/2014                        Bone Pathology Markers No results found for: VD25OH, XA128NO6VEH, MC9470JG2, EZ6629UT6, 25OHVITD1, 25OHVITD2, 25OHVITD3, TESTOFREE, TESTOSTERONE                       Coagulation Parameters Lab Results  Component Value Date   INR 0.9 08/02/2014   LABPROT 12.2 08/02/2014   APTT 28.5 08/02/2014   PLT 141 (L) 03/10/2018                        Cardiovascular Markers Lab Results  Component Value Date   CKTOTAL 298 08/03/2014   CKMB 42.1 (H) 08/02/2014   TROPONINI 0.03 (HH) 03/10/2018   HGB 13.9 03/10/2018   HCT 39.5 (L) 03/10/2018                         CA Markers No results found for: CEA, CA125, LABCA2                      Note: Lab results reviewed.  Recent Diagnostic Imaging Results  DG Si Joints CLINICAL DATA:  Left hip pain for 1 year.  EXAM: BILATERAL SACROILIAC JOINTS - 3+ VIEW  COMPARISON:  None.  FINDINGS: Sacrum is intact. Mild degenerative changes are noted at the SI joints bilaterally.  Right total hip arthroplasty is present. Right hip is located. Moderate degenerative changes are noted at the left hip.  IMPRESSION: 1. Mild degenerative changes involving the SI joints bilaterally. 2. Moderate degenerative change of the left hip. 3. Right total hip arthroplasty without evidence for complication.  Electronically Signed   By: San Morelle  M.D.   On: 05/14/2018 09:07  Complexity Note: Imaging results reviewed. Results shared with Mr. Kamara, using Layman's terms.                         Meds   Current Outpatient Medications:  .  aspirin 325 MG EC tablet, Take 325 mg by mouth daily., Disp: , Rfl:  .  COLLAGEN PO, Take 1 Scoop by mouth., Disp: , Rfl:  .  nitroGLYCERIN (NITROSTAT) 0.4 MG SL tablet, Place 0.4 mg under the tongue every 5 (five) minutes as needed for chest pain. Reported on 12/12/2015, Disp: , Rfl:  .  pravastatin (PRAVACHOL) 20 MG tablet, Take 20 mg by mouth daily., Disp: , Rfl:  .  vitamin B-12 (CYANOCOBALAMIN) 500 MCG tablet, Take 500 mcg by mouth daily., Disp: , Rfl:  .  Coenzyme Q10 (COQ10) 100 MG CAPS, Take by mouth., Disp: , Rfl:  .  sildenafil (REVATIO) 20 MG tablet, Take 20 mg by mouth daily as needed (ED). , Disp: , Rfl:  .  TURMERIC PO, Take 900 mg by mouth daily., Disp: , Rfl:   ROS  Constitutional: Denies any fever or chills Gastrointestinal: No reported hemesis, hematochezia, vomiting, or acute GI distress Musculoskeletal: Denies any acute onset joint swelling, redness, loss of ROM, or weakness Neurological: No reported episodes of acute onset apraxia, aphasia, dysarthria, agnosia, amnesia, paralysis, loss of coordination, or loss of consciousness  Allergies  Mr. Hardman has No Known Allergies.  PFSH  Drug: Mr. Flegel  reports that he does not use drugs. Alcohol:  reports that he drinks about 2.4 oz of alcohol per week. Tobacco:  reports  that he has never smoked. He has never used smokeless tobacco. Medical:  has a past medical history of Anginal pain ([place]), Cancer ([place]), Coronary artery disease, Edema, Hypertension, Myocardial infarction ([place]), and Obesity. Surgical: Mr. Wittwer  has a past surgical history that includes Shoulder acromioplasty and Colonoscopy with propofol (N/A, 12/12/2015). Family: family history is not on file.  Constitutional Exam  General appearance: Well nourished, well  developed, and well hydrated. In no apparent acute distress Vitals:   06/02/18 1235  BP: (!) 166/98  Pulse: 65  Resp: 16  Temp: 97.9 F (36.6 C)  TempSrc: Oral  SpO2: 99%  Weight: 233 lb (105.7 kg)  Height: '6\' 2"'$  (1.88 m)   BMI Assessment: Estimated body mass index is 29.92 kg/m as calculated from the following:   Height as of this encounter: '6\' 2"'$  (1.88 m).   Weight as of this encounter: 233 lb (105.7 kg).  BMI interpretation table: BMI level Category Range association with higher incidence of chronic pain  <18 kg/m2 Underweight   18.5-24.9 kg/m2 Ideal body weight   25-29.9 kg/m2 Overweight Increased incidence by 20%  30-34.9 kg/m2 Obese (Class I) Increased incidence by 68%  35-39.9 kg/m2 Severe obesity (Class II) Increased incidence by 136%  >40 kg/m2 Extreme obesity (Class III) Increased incidence by 254%   Patient's current BMI Ideal Body weight  Body mass index is 29.92 kg/m. Ideal body weight: 82.2 kg (181 lb 3.5 oz) Adjusted ideal body weight: 91.6 kg (201 lb 14.9 oz)   BMI Readings from Last 4 Encounters:  06/02/18 29.92 kg/m  05/05/18 30.69 kg/m  03/27/18 30.69 kg/m  03/10/18 30.49 kg/m   Wt Readings from Last 4 Encounters:  06/02/18 233 lb (105.7 kg)  05/05/18 239 lb (108.4 kg)  03/27/18 239 lb (108.4 kg)  03/10/18 237 lb 8 oz (107.7 kg)  Psych/Mental status: Alert, oriented x 3 (person, place, & time)       Eyes: PERLA Respiratory: No evidence of acute respiratory distress  Cervical Spine Area Exam  Skin & Axial Inspection: No masses, redness, edema, swelling, or associated skin lesions Alignment: Symmetrical Functional ROM: Unrestricted ROM      Stability: No instability detected Muscle Tone/Strength: Functionally intact. No obvious neuro-muscular anomalies detected. Sensory (Neurological): Unimpaired Palpation: No palpable anomalies              Upper Extremity (UE) Exam    Side: Right upper extremity  Side: Left upper extremity  Skin &  Extremity Inspection: Skin color, temperature, and hair growth are WNL. No peripheral edema or cyanosis. No masses, redness, swelling, asymmetry, or associated skin lesions. No contractures.  Skin & Extremity Inspection: Skin color, temperature, and hair growth are WNL. No peripheral edema or cyanosis. No masses, redness, swelling, asymmetry, or associated skin lesions. No contractures.  Functional ROM: Unrestricted ROM          Functional ROM: Unrestricted ROM          Muscle Tone/Strength: Functionally intact. No obvious neuro-muscular anomalies detected.  Muscle Tone/Strength: Functionally intact. No obvious neuro-muscular anomalies detected.  Sensory (Neurological): Unimpaired          Sensory (Neurological): Unimpaired          Palpation: No palpable anomalies              Palpation: No palpable anomalies              Provocative Test(s):  Phalen's test: deferred Tinel's test: deferred Apley's scratch test (touch opposite shoulder):  Action 1 (Across chest): deferred Action 2 (Overhead): deferred Action 3 (LB reach): deferred   Provocative Test(s):  Phalen's test: deferred Tinel's test: deferred Apley's scratch test (touch opposite shoulder):  Action 1 (Across chest): deferred Action 2 (Overhead): deferred Action 3 (LB reach): deferred    Thoracic Spine Area Exam  Skin & Axial Inspection: No masses, redness, or swelling Alignment: Symmetrical Functional ROM: Unrestricted ROM Stability: No instability detected Muscle Tone/Strength: Functionally intact. No obvious neuro-muscular anomalies detected. Sensory (Neurological): Unimpaired Muscle strength & Tone: No palpable anomalies  Lumbar Spine Area Exam  Skin & Axial Inspection: No masses, redness, or swelling Alignment: Symmetrical Functional ROM: Unrestricted ROM       Stability: No instability detected Muscle Tone/Strength: Functionally intact. No obvious neuro-muscular anomalies detected. Sensory (Neurological):  Unimpaired Palpation: No palpable anomalies       Provocative Tests: Hyperextension/rotation test: deferred today       Lumbar quadrant test (Kemp's test): deferred today       Lateral bending test: deferred today       Patrick's Maneuver: (+) for left-sided S-I arthralgia             FABER test: (+) for left-sided S-I arthralgia             S-I anterior distraction/compression test: deferred today         S-I lateral compression test: deferred today         S-I Thigh-thrust test: deferred today         S-I Gaenslen's test: deferred today          Gait & Posture Assessment  Ambulation: Unassisted Gait: Relatively normal for age and body habitus Posture: WNL   Lower Extremity Exam    Side: Right lower extremity  Side: Left lower extremity  Stability: No instability observed          Stability: No instability observed          Skin & Extremity Inspection: Skin color, temperature, and hair growth are WNL. No peripheral edema or cyanosis. No masses, redness, swelling, asymmetry, or associated skin lesions. No contractures.  Skin & Extremity Inspection: Skin color, temperature, and hair growth are WNL. No peripheral edema or cyanosis. No masses, redness, swelling, asymmetry, or associated skin lesions. No contractures.  Functional ROM: Unrestricted ROM                  Functional ROM: Unrestricted ROM                  Muscle Tone/Strength: Functionally intact. No obvious neuro-muscular anomalies detected.  Muscle Tone/Strength: Functionally intact. No obvious neuro-muscular anomalies detected.  Sensory (Neurological): Arthropathic arthralgia  Sensory (Neurological): Arthropathic arthralgia  Palpation: No palpable anomalies  Palpation: No palpable anomalies   Assessment  Primary Diagnosis & Pertinent Problem List: The primary encounter diagnosis was Primary osteoarthritis of left knee. Diagnoses of Chronic left SI joint pain and SI joint arthritis (LEFT) were also pertinent to this  visit.  Status Diagnosis  Responding Persistent Persistent 1. Primary osteoarthritis of left knee   2. Chronic left SI joint pain   3. SI joint arthritis (LEFT)     Problems updated and reviewed during this visit: Problem  Primary Osteoarthritis of Left Knee  Chronic Left Si Joint Pain  SI joint arthritis (LEFT)   General Recommendations: The pain condition that the patient suffers from is best treated with a multidisciplinary approach that involves an increase in physical activity to prevent de-conditioning  and worsening of the pain cycle, as well as psychological counseling (formal and/or informal) to address the co-morbid psychological affects of pain. Treatment will often involve judicious use of pain medications and interventional procedures to decrease the pain, allowing the patient to participate in the physical activity that will ultimately produce long-lasting pain reductions. The goal of the multidisciplinary approach is to return the patient to a higher level of overall function and to restore their ability to perform activities of daily living.  72 year old male with a history of left knee osteoarthritis who follows up status post left knee genicular nerve block which provided him with greater than 80% pain relief for approximately 14 days after which patient had return of his left knee pain primarily due to overexertion.  Today the patient's SI joint x-rays were also reviewed and reveals left SI joint arthritis.  In regards to treatment plan, we discussed repeating left knee genicular nerve block #2 for his left knee osteoarthritic pain as well as left SI joint injection for his left SI joint arthritis and associated pain.  risks and benefits of both of these procedures were discussed and patient would like to proceed.  Patient also states that he has been utilizing CBD oil which has been very effective for his joint pain.  Plan: -Left knee genicular nerve block #2 -Left SI joint  injection #1   Lab-work, procedure(s), and/or referral(s): Orders Placed This Encounter  Procedures  . GENICULAR NERVE BLOCK  . SACROILIAC JOINT INJECTION    Provider-requested follow-up: Return in about 1 week (around 06/09/2018) for Procedure. Time Note: Greater than 50% of the 25 minute(s) of face-to-face time spent with Mr. Rodman, was spent in counseling/coordination of care regarding: Mr. Sennett primary cause of pain, the treatment plan, treatment alternatives, the risks and possible complications of proposed treatment, going over the informed consent, the results, interpretation and significance of  his recent diagnostic interventional treatment(s) and realistic expectations.  Future Appointments  Date Time Provider [place]  06/09/2018  9:00 AM Gillis Santa, MD [facility] None    Primary Care Physician: Rusty Aus, MD Location: [facility] Note by: Gillis Santa, M.D Date: 06/02/2018; Time: 1:30 PM  Patient Instructions   Stop taking 325 mg aspiring today; begin 81 mg aspirin instead until after procedure.  GENERAL RISKS AND COMPLICATIONS  What are the risk, side effects and possible complications? Generally speaking, most procedures are safe.  However, with any procedure there are risks, side effects, and the possibility of complications.  The risks and complications are dependent upon the sites that are lesioned, or the type of nerve block to be performed.  The closer the procedure is to the spine, the more serious the risks are.  Great care is taken when placing the radio frequency needles, block needles or lesioning probes, but sometimes complications can occur. 1. Infection: Any time there is an injection through the skin, there is a risk of infection.  This is why sterile conditions are used for these blocks.  There are four possible types of infection. 1. Localized skin infection. 2. Central Nervous System Infection-This can be in  the form of Meningitis, which can be deadly. 3. Epidural Infections-This can be in the form of an epidural abscess, which can cause pressure inside of the spine, causing compression of the spinal cord with subsequent paralysis. This would require an emergency surgery to decompress, and there are no guarantees that the patient would recover from the paralysis. 4. Discitis-This is  an infection of the intervertebral discs.  It occurs in about 1% of discography procedures.  It is difficult to treat and it may lead to surgery.        2. Pain: the needles have to go through skin and soft tissues, will cause soreness.       3. Damage to internal structures:  The nerves to be lesioned may be near blood vessels or    other nerves which can be potentially damaged.       4. Bleeding: Bleeding is more common if the patient is taking blood thinners such as  aspirin, Coumadin, Ticiid, Plavix, etc., or if he/she have some genetic predisposition  such as hemophilia. Bleeding into the spinal canal can cause compression of the spinal  cord with subsequent paralysis.  This would require an emergency surgery to  decompress and there are no guarantees that the patient would recover from the  paralysis.       5. Pneumothorax:  Puncturing of a lung is a possibility, every time a needle is introduced in  the area of the chest or upper back.  Pneumothorax refers to free air around the  collapsed lung(s), inside of the thoracic cavity (chest cavity).  Another two possible  complications related to a similar event would include: Hemothorax and Chylothorax.   These are variations of the Pneumothorax, where instead of air around the collapsed  lung(s), you may have blood or chyle, respectively.       6. Spinal headaches: They may occur with any procedures in the area of the spine.       7. Persistent CSF (Cerebro-Spinal Fluid) leakage: This is a rare problem, but may occur  with prolonged intrathecal or epidural catheters either due  to the formation of a fistulous  track or a dural tear.       8. Nerve damage: By working so close to the spinal cord, there is always a possibility of  nerve damage, which could be as serious as a permanent spinal cord injury with  paralysis.       9. Death:  Although rare, severe deadly allergic reactions known as "Anaphylactic  reaction" can occur to any of the medications used.      10. Worsening of the symptoms:  We can always make thing worse.  What are the chances of something like this happening? Chances of any of this occuring are extremely low.  By statistics, you have more of a chance of getting killed in a motor vehicle accident: while driving to the hospital than any of the above occurring .  Nevertheless, you should be aware that they are possibilities.  In general, it is similar to taking a shower.  Everybody knows that you can slip, hit your head and get killed.  Does that mean that you should not shower again?  Nevertheless always keep in mind that statistics do not mean anything if you happen to be on the wrong side of them.  Even if a procedure has a 1 (one) in a 1,000,000 (million) chance of going wrong, it you happen to be that one..Also, keep in mind that by statistics, you have more of a chance of having something go wrong when taking medications.  Who should not have this procedure? If you are on a blood thinning medication (e.g. Coumadin, Plavix, see list of "Blood Thinners"), or if you have an active infection going on, you should not have the procedure.  If you are taking any  blood thinners, please inform your physician.  How should I prepare for this procedure?  Do not eat or drink anything at least six hours prior to the procedure.  Bring a driver with you .  It cannot be a taxi.  Come accompanied by an adult that can drive you back, and that is strong enough to help you if your legs get weak or numb from the local anesthetic.  Take all of your medicines the morning of  the procedure with just enough water to swallow them.  If you have diabetes, make sure that you are scheduled to have your procedure done first thing in the morning, whenever possible.  If you have diabetes, take only half of your insulin dose and notify our nurse that you have done so as soon as you arrive at the clinic.  If you are diabetic, but only take blood sugar pills (oral hypoglycemic), then do not take them on the morning of your procedure.  You may take them after you have had the procedure.  Do not take aspirin or any aspirin-containing medications, at least eleven (11) days prior to the procedure.  They may prolong bleeding.  Wear loose fitting clothing that may be easy to take off and that you would not mind if it got stained with Betadine or blood.  Do not wear any jewelry or perfume  Remove any nail coloring.  It will interfere with some of our monitoring equipment.  NOTE: Remember that this is not meant to be interpreted as a complete list of all possible complications.  Unforeseen problems may occur.  BLOOD THINNERS The following drugs contain aspirin or other products, which can cause increased bleeding during surgery and should not be taken for 2 weeks prior to and 1 week after surgery.  If you should need take something for relief of minor pain, you may take acetaminophen which is found in Tylenol,m Datril, Anacin-3 and Panadol. It is not blood thinner. The products listed below are.  Do not take any of the products listed below in addition to any listed on your instruction sheet.  A.P.C or A.P.C with Codeine Codeine Phosphate Capsules #3 Ibuprofen Ridaura  ABC compound Congesprin Imuran rimadil  Advil Cope Indocin Robaxisal  Alka-Seltzer Effervescent Pain Reliever and Antacid Coricidin or Coricidin-D  Indomethacin Rufen  Alka-Seltzer plus Cold Medicine Cosprin Ketoprofen S-A-C Tablets  Anacin Analgesic Tablets or Capsules Coumadin Korlgesic Salflex  Anacin Extra  Strength Analgesic tablets or capsules CP-2 Tablets Lanoril Salicylate  Anaprox Cuprimine Capsules Levenox Salocol  Anexsia-D Dalteparin Magan Salsalate  Anodynos Darvon compound Magnesium Salicylate Sine-off  Ansaid Dasin Capsules Magsal Sodium Salicylate  Anturane Depen Capsules Marnal Soma  APF Arthritis pain formula Dewitt's Pills Measurin Stanback  Argesic Dia-Gesic Meclofenamic Sulfinpyrazone  Arthritis Bayer Timed Release Aspirin Diclofenac Meclomen Sulindac  Arthritis pain formula Anacin Dicumarol Medipren Supac  Analgesic (Safety coated) Arthralgen Diffunasal Mefanamic Suprofen  Arthritis Strength Bufferin Dihydrocodeine Mepro Compound Suprol  Arthropan liquid Dopirydamole Methcarbomol with Aspirin Synalgos  ASA tablets/Enseals Disalcid Micrainin Tagament  Ascriptin Doan's Midol Talwin  Ascriptin A/D Dolene Mobidin Tanderil  Ascriptin Extra Strength Dolobid Moblgesic Ticlid  Ascriptin with Codeine Doloprin or Doloprin with Codeine Momentum Tolectin  Asperbuf Duoprin Mono-gesic Trendar  Aspergum Duradyne Motrin or Motrin IB Triminicin  Aspirin plain, buffered or enteric coated Durasal Myochrisine Trigesic  Aspirin Suppositories Easprin Nalfon Trillsate  Aspirin with Codeine Ecotrin Regular or Extra Strength Naprosyn Uracel  Atromid-S Efficin Naproxen Ursinus  Auranofin Capsules Elmiron Neocylate Vanquish  Axotal Emagrin Norgesic Verin  Azathioprine Empirin or Empirin with Codeine Normiflo Vitamin E  Azolid Emprazil Nuprin Voltaren  Bayer Aspirin plain, buffered or children's or timed BC Tablets or powders Encaprin Orgaran Warfarin Sodium  Buff-a-Comp Enoxaparin Orudis Zorpin  Buff-a-Comp with Codeine Equegesic Os-Cal-Gesic   Buffaprin Excedrin plain, buffered or Extra Strength Oxalid   Bufferin Arthritis Strength Feldene Oxphenbutazone   Bufferin plain or Extra Strength Feldene Capsules Oxycodone with Aspirin   Bufferin with Codeine Fenoprofen Fenoprofen Pabalate or  Pabalate-SF   Buffets II Flogesic Panagesic   Buffinol plain or Extra Strength Florinal or Florinal with Codeine Panwarfarin   Buf-Tabs Flurbiprofen Penicillamine   Butalbital Compound Four-way cold tablets Penicillin   Butazolidin Fragmin Pepto-Bismol   Carbenicillin Geminisyn Percodan   Carna Arthritis Reliever Geopen Persantine   Carprofen Gold's salt Persistin   Chloramphenicol Goody's Phenylbutazone   Chloromycetin Haltrain Piroxlcam   Clmetidine heparin Plaquenil   Cllnoril Hyco-pap Ponstel   Clofibrate Hydroxy chloroquine Propoxyphen         Before stopping any of these medications, be sure to consult the physician who ordered them.  Some, such as Coumadin (Warfarin) are ordered to prevent or treat serious conditions such as "deep thrombosis", "pumonary embolisms", and other heart problems.  The amount of time that you may need off of the medication may also vary with the medication and the reason for which you were taking it.  If you are taking any of these medications, please make sure you notify your pain physician before you undergo any procedures.         Sacroiliac (SI) Joint Injection Patient Information  Description: The sacroiliac joint connects the scrum (very low back and tailbone) to the ilium (a pelvic bone which also forms half of the hip joint).  Normally this joint experiences very little motion.  When this joint becomes inflamed or unstable low back and or hip and pelvis pain may result.  Injection of this joint with local anesthetics (numbing medicines) and steroids can provide diagnostic information and reduce pain.  This injection is performed with the aid of x-ray guidance into the tailbone area while you are lying on your stomach.   You may experience an electrical sensation down the leg while this is being done.  You may also experience numbness.  We also may ask if we are reproducing your normal pain during the injection.  Conditions which may be treated  SI injection:   Low back, buttock, hip or leg pain  Preparation for the Injection:  1. Do not eat any solid food or dairy products within 8 hours of your appointment.  2. You may drink clear liquids up to 3 hours before appointment.  Clear liquids include water, black coffee, juice or soda.  No milk or cream please. 3. You may take your regular medications, including pain medications with a sip of water before your appointment.  Diabetics should hold regular insulin (if take separately) and take 1/2 normal NPH dose the morning of the procedure.  Carry some sugar containing items with you to your appointment. 4. A driver must accompany you and be prepared to drive you home after your procedure. 5. Bring all of your current medications with you. 6. An IV may be inserted and sedation may be given at the discretion of the physician. 7. A blood pressure cuff, EKG and other monitors will often be applied during the procedure.  Some patients may need to have extra oxygen administered for a short period.  8. You will be asked to provide medical information, including your allergies, prior to the procedure.  We must know immediately if you are taking blood thinners (like Coumadin/Warfarin) or if you are allergic to IV iodine contrast (dye).  We must know if you could possible be pregnant.  Possible side effects:   Bleeding from needle site  Infection (rare, may require surgery)  Nerve injury (rare)  Numbness & tingling (temporary)  A brief convulsion or seizure  Light-headedness (temporary)  Pain at injection site (several days)  Decreased blood pressure (temporary)  Weakness in the leg (temporary)   Call if you experience:   New onset weakness or numbness of an extremity below the injection site that last more than 8 hours.  Hives or difficulty breathing ( go to the emergency room)  Inflammation or drainage at the injection site  Any new symptoms which are concerning to  you  Please note:  Although the local anesthetic injected can often make your back/ hip/ buttock/ leg feel good for several hours after the injections, the pain will likely return.  It takes 3-7 days for steroids to work in the sacroiliac area.  You may not notice any pain relief for at least that one week.  If effective, we will often do a series of three injections spaced 3-6 weeks apart to maximally decrease your pain.  After the initial series, we generally will wait some months before a repeat injection of the same type.  If you have any questions, please call [phone] [facility]

## 2018-06-09 ENCOUNTER — Ambulatory Visit (HOSPITAL_BASED_OUTPATIENT_CLINIC_OR_DEPARTMENT_OTHER): Payer: Medicare HMO | Admitting: Student in an Organized Health Care Education/Training Program

## 2018-06-09 ENCOUNTER — Ambulatory Visit
Admission: RE | Admit: 2018-06-09 | Discharge: 2018-06-09 | Disposition: A | Discharge status: Home / Self Care | Payer: Medicare HMO | Source: Ambulatory Visit | Attending: Student in an Organized Health Care Education/Training Program | Admitting: Student in an Organized Health Care Education/Training Program

## 2018-06-09 ENCOUNTER — Encounter: Payer: Self-pay | Admitting: Student in an Organized Health Care Education/Training Program

## 2018-06-09 VITALS — BP 183/94 | HR 62 | Temp 97.6°F | Resp 16 | Ht 74.0 in | Wt 233.0 lb

## 2018-06-09 DIAGNOSIS — M1712 Unilateral primary osteoarthritis, left knee: Secondary | ICD-10-CM | POA: Diagnosis not present

## 2018-06-09 DIAGNOSIS — G8929 Other chronic pain: Secondary | ICD-10-CM

## 2018-06-09 DIAGNOSIS — M533 Sacrococcygeal disorders, not elsewhere classified: Secondary | ICD-10-CM

## 2018-06-09 DIAGNOSIS — M25552 Pain in left hip: Secondary | ICD-10-CM | POA: Diagnosis present

## 2018-06-09 DIAGNOSIS — M47818 Spondylosis without myelopathy or radiculopathy, sacral and sacrococcygeal region: Secondary | ICD-10-CM | POA: Diagnosis not present

## 2018-06-09 MED ORDER — ROPIVACAINE HCL 2 MG/ML IJ SOLN
INTRAMUSCULAR | Status: AC
  Filled 2018-06-09: qty 10

## 2018-06-09 MED ORDER — LIDOCAINE HCL 2 % IJ SOLN: 20.0000 mL | Freq: Once | INTRAMUSCULAR | Status: DC

## 2018-06-09 MED ORDER — DEXAMETHASONE SODIUM PHOSPHATE 10 MG/ML IJ SOLN
10.0000 mg | Freq: Once | INTRAMUSCULAR | Status: AC
  Administered 2018-06-09: 10 mg

## 2018-06-09 MED ORDER — LIDOCAINE HCL 2 % IJ SOLN
20.0000 mL | Freq: Once | INTRAMUSCULAR | Status: AC
  Administered 2018-06-09: 400 mg

## 2018-06-09 MED ORDER — DEXAMETHASONE SODIUM PHOSPHATE 10 MG/ML IJ SOLN
INTRAMUSCULAR | Status: AC
  Filled 2018-06-09: qty 1

## 2018-06-09 MED ORDER — ROPIVACAINE HCL 2 MG/ML IJ SOLN
10.0000 mL | Freq: Once | INTRAMUSCULAR | Status: AC
  Administered 2018-06-09: 10 mL

## 2018-06-09 MED ORDER — LIDOCAINE HCL 2 % IJ SOLN
INTRAMUSCULAR | Status: AC
  Filled 2018-06-09: qty 20

## 2018-06-09 NOTE — Progress Notes (Signed)
Patient's Name: Ricardo Winters.  MRN: [id number]  Referring Provider: Rusty Aus, MD  DOB: 1946-02-26  PCP: Rusty Aus, MD  DOS: 06/09/2018  Note by: Gillis Santa, MD  Service setting: Ambulatory outpatient  Specialty: Interventional Pain Management  Patient type: Established  Location: [facility] (AMB) Pain Management Facility  Visit type: Interventional Procedure   Primary Reason for Visit: Interventional Pain Management Treatment. CC: Knee Pain (left) and Hip Pain (left )  Procedure:          Anesthesia, Analgesia, Anxiolysis:  Type: Genicular Nerves Block (Superior-lateral, Superior-medial, and Inferior-medial Genicular Nerves)          CPT: 91638      Primary Purpose: Diagnostic Region: Lateral, Anterior, and Medial aspects of the knee joint, above and below the knee joint proper. Level: Superior and inferior to the knee joint. Target Area: For Genicular Nerve block(s), the targets are: the superior-lateral genicular nerve, located in the lateral distal portion of the femoral shaft as it curves to form the lateral epicondyle, in the region of the distal femoral metaphysis; the superior-medial genicular nerve, located in the medial distal portion of the femoral shaft as it curves to form the medial epicondyle; and the inferior-medial genicular nerve, located in the medial, proximal portion of the tibial shaft, as it curves to form the medial epicondyle, in the region of the proximal tibial metaphysis. Approach: Anterior, percutaneous, ipsilateral approach. Laterality: Left knee Position: Modified Fowler's position with pillows under the targeted knee(s).  Type: Local Anesthesia Indication(s): Analgesia         Route: Infiltration ([place]/IM) IV Access: Declined Sedation: Declined  Local Anesthetic: Lidocaine 1-2%   Indications: 1. Primary osteoarthritis of left knee   2. Chronic left SI joint pain   3. SI joint arthritis (LEFT)    Pain Score: Pre-procedure: 1 (knee = 1 and hip =  10.)/10 Post-procedure: 0-No pain(0 knee. 5 hip)/10  Pre-op Assessment:  Ricardo Winters is a 72 y.o. (year old), male patient, seen today for interventional treatment. He  has a past surgical history that includes Shoulder acromioplasty and Colonoscopy with propofol (N/A, 12/12/2015). Ricardo Winters has a current medication list which includes the following prescription(s): amlodipine, amoxicillin, aspirin, coq10, collagen, nitroglycerin, pravastatin, sildenafil, vitamin b-12, and turmeric, and the following Facility-Administered Medications: lidocaine. His primarily concern today is the Knee Pain (left) and Hip Pain (left )  Initial Vital Signs:  Pulse/HCG Rate: (!) 54  Temp: 97.6 F (36.4 C) Resp: 16 BP: (!) 161/90 SpO2: 100 %  BMI: Estimated body mass index is 29.92 kg/m as calculated from the following:   Height as of this encounter: 6\' 2"  (1.88 m).   Weight as of this encounter: 233 lb (105.7 kg).  Risk Assessment: Allergies: Reviewed. He has No Known Allergies.  Allergy Precautions: None required Coagulopathies: Reviewed. None identified.  Blood-thinner therapy: None at this time Active Infection(s): Reviewed. None identified. Ricardo Winters is afebrile  Site Confirmation: Ricardo Winters was asked to confirm the procedure and laterality before marking the site Procedure checklist: Completed Consent: Before the procedure and under the influence of no sedative(s), amnesic(s), or anxiolytics, the patient was informed of the treatment options, risks and possible complications. To fulfill our ethical and legal obligations, as recommended by the American Medical Association's Code of Ethics, I have informed the patient of my clinical impression; the nature and purpose of the treatment or procedure; the risks, benefits, and possible complications of the intervention; the alternatives, including doing nothing; the  risk(s) and benefit(s) of the alternative treatment(s) or procedure(s); and the risk(s) and  benefit(s) of doing nothing. The patient was provided information about the general risks and possible complications associated with the procedure. These may include, but are not limited to: failure to achieve desired goals, infection, bleeding, organ or nerve damage, allergic reactions, paralysis, and death. In addition, the patient was informed of those risks and complications associated to the procedure, such as failure to decrease pain; infection; bleeding; organ or nerve damage with subsequent damage to sensory, motor, and/or autonomic systems, resulting in permanent pain, numbness, and/or weakness of one or several areas of the body; allergic reactions; (i.e.: anaphylactic reaction); and/or death. Furthermore, the patient was informed of those risks and complications associated with the medications. These include, but are not limited to: allergic reactions (i.e.: anaphylactic or anaphylactoid reaction(s)); adrenal axis suppression; blood sugar elevation that in diabetics may result in ketoacidosis or comma; water retention that in patients with history of congestive heart failure may result in shortness of breath, pulmonary edema, and decompensation with resultant heart failure; weight gain; swelling or edema; medication-induced neural toxicity; particulate matter embolism and blood vessel occlusion with resultant organ, and/or nervous system infarction; and/or aseptic necrosis of one or more joints. Finally, the patient was informed that Medicine is not an exact science; therefore, there is also the possibility of unforeseen or unpredictable risks and/or possible complications that may result in a catastrophic outcome. The patient indicated having understood very clearly. We have given the patient no guarantees and we have made no promises. Enough time was given to the patient to ask questions, all of which were answered to the patient's satisfaction. Ricardo Winters has indicated that he wanted to continue  with the procedure. Attestation: I, the ordering provider, attest that I have discussed with the patient the benefits, risks, side-effects, alternatives, likelihood of achieving goals, and potential problems during recovery for the procedure that I have provided informed consent. Date  Time: 06/09/2018  8:48 AM  Pre-Procedure Preparation:  Monitoring: As per clinic protocol. Respiration, ETCO2, SpO2, BP, heart rate and rhythm monitor placed and checked for adequate function Safety Precautions: Patient was assessed for positional comfort and pressure points before starting the procedure. Time-out: I initiated and conducted the "Time-out" before starting the procedure, as per protocol. The patient was asked to participate by confirming the accuracy of the "Time Out" information. Verification of the correct person, site, and procedure were performed and confirmed by me, the nursing staff, and the patient. "Time-out" conducted as per Joint Commission's Universal Protocol (UP.01.01.01). Time: [phone]  Description of Procedure:          Area Prepped: Entire knee area, from mid-thigh to mid-shin, lateral, anterior, and medial aspects. Prepping solution: ChloraPrep (2% chlorhexidine gluconate and 70% isopropyl alcohol) Safety Precautions: Aspiration looking for blood return was conducted prior to all injections. At no point did we inject any substances, as a needle was being advanced. No attempts were made at seeking any paresthesias. Safe injection practices and needle disposal techniques used. Medications properly checked for expiration dates. SDV (single dose vial) medications used. Latex Allergy precautions taken.   Description of the Procedure: Protocol guidelines were followed. The patient was placed in position over the procedure table. The target area was identified and the area prepped in the usual manner. Skin & deeper tissues infiltrated with local anesthetic. Appropriate amount of time allowed to pass  for local anesthetics to take effect. The procedure needles were then advanced to the  target area. Proper needle placement secured. Negative aspiration confirmed. Solution injected in intermittent fashion, asking for systemic symptoms every 0.5cc of injectate. The needles were then removed and the area cleansed, making sure to leave some of the prepping solution back to take advantage of its long term bactericidal properties.  Vitals:   06/09/18 0950 06/09/18 1000 06/09/18 1005 06/09/18 1010  BP: (!) 178/92 (!) 188/94 (!) 180/96 (!) 183/94  Pulse: 64 (!) 59 63 62  Resp: 17 16 16 16   Temp:      TempSrc:      SpO2: 98% 99% 98% 99%  Weight:      Height:        Start Time: 0948 hrs. End Time: 1006 hrs. Materials:  Needle(s) Type: Regular needle Gauge: 22G Length: 3.5-in Medication(s): Please see orders for medications and dosing details. 6 cc solution containing 5 cc of 0.2% ropivacaine, 1 cc of Decadron 10 mg/cc.  2 cc injected at each genicular nerve above. Imaging Guidance (Non-Spinal):          Type of Imaging Technique: Fluoroscopy Guidance (Non-Spinal) Indication(s): Assistance in needle guidance and placement for procedures requiring needle placement in or near specific anatomical locations not easily accessible without such assistance. Exposure Time: Please see nurses notes. Contrast: Before injecting any contrast, we confirmed that the patient did not have an allergy to iodine, shellfish, or radiological contrast. Once satisfactory needle placement was completed at the desired level, radiological contrast was injected. Contrast injected under live fluoroscopy. No contrast complications. See chart for type and volume of contrast used. Fluoroscopic Guidance: I was personally present during the use of fluoroscopy. "Tunnel Vision Technique" used to obtain the best possible view of the target area. Parallax error corrected before commencing the procedure. "Direction-depth-direction"  technique used to introduce the needle under continuous pulsed fluoroscopy. Once target was reached, antero-posterior, oblique, and lateral fluoroscopic projection used confirm needle placement in all planes. Images permanently stored in EMR. Interpretation: I personally interpreted the imaging intraoperatively. Adequate needle placement confirmed in multiple planes. Appropriate spread of contrast into desired area was observed. No evidence of afferent or efferent intravascular uptake. Permanent images saved into the patient's record.  Antibiotic Prophylaxis:   Anti-infectives (From admission, onward)   None     Indication(s): None identified  Post-operative Assessment:  Post-procedure Vital Signs:  Pulse/HCG Rate: 62  Temp: 97.6 F (36.4 C) Resp: 16 BP: (!) 183/94 SpO2: 99 %  EBL: None  Complications: No immediate post-treatment complications observed by team, or reported by patient.  Note: The patient tolerated the entire procedure well. A repeat set of vitals were taken after the procedure and the patient was kept under observation following institutional policy, for this type of procedure. Post-procedural neurological assessment was performed, showing return to baseline, prior to discharge. The patient was provided with post-procedure discharge instructions, including a section on how to identify potential problems. Should any problems arise concerning this procedure, the patient was given instructions to immediately contact us, at any time, without hesitation. In any case, we plan to contact the patient by telephone for a follow-up status report regarding this interventional procedure.  Comments:  No additional relevant information.  Plan of Care   Imaging Orders     DG C-Arm 1-60 Min-No Report Procedure Orders    No procedure(s) ordered today    Medications ordered for procedure: Meds ordered this encounter  Medications  . lidocaine (XYLOCAINE) 2 % (with pres) injection  400 mg  . ropivacaine (PF)  2 mg/mL (0.2%) (NAROPIN) injection 10 mL  . lidocaine (XYLOCAINE) 2 % (with pres) injection 400 mg  . dexamethasone (DECADRON) injection 10 mg  . dexamethasone (DECADRON) injection 10 mg   Medications administered: We administered ropivacaine (PF) 2 mg/mL (0.2%), lidocaine, dexamethasone, and dexamethasone.  See the medical record for exact dosing, route, and time of administration.  New Prescriptions   No medications on file   Disposition: Discharge home  Discharge Date & Time: 06/09/2018; 1015 hrs.   Physician-requested Follow-up: Return in about 4 weeks (around 07/07/2018) for Post Procedure Evaluation.  Future Appointments  Date Time Provider [place]  07/07/2018  1:15 PM Gillis Santa, MD [facility] None   Primary Care Physician: Rusty Aus, MD Location: [facility] Outpatient Pain Management Facility Note by: Gillis Santa, MD Date: 06/09/2018; Time: 10:44 AM  Disclaimer:  Medicine is not an exact science. The only guarantee in medicine is that nothing is guaranteed. It is important to note that the decision to proceed with this intervention was based on the information collected from the patient. The Data and conclusions were drawn from the patient's questionnaire, the interview, and the physical examination. Because the information was provided in large part by the patient, it cannot be guaranteed that it has not been purposely or unconsciously manipulated. Every effort has been made to obtain as much relevant data as possible for this evaluation. It is important to note that the conclusions that lead to this procedure are derived in large part from the available data. Always take into account that the treatment will also be dependent on availability of resources and existing treatment guidelines, considered by other Pain Management Practitioners as being common knowledge and practice, at the time of the intervention. For Medico-Legal purposes, it is also  important to point out that variation in procedural techniques and pharmacological choices are the acceptable norm. The indications, contraindications, technique, and results of the above procedure should only be interpreted and judged by a Board-Certified Interventional Pain Specialist with extensive familiarity and expertise in the same exact procedure and technique.

## 2018-06-09 NOTE — Progress Notes (Signed)
Safety precautions to be maintained throughout the outpatient stay will include: orient to surroundings, keep bed in low position, maintain call bell within reach at all times, provide assistance with transfer out of bed and ambulation.  

## 2018-06-09 NOTE — Progress Notes (Signed)
Patient's Name: Ricardo Winters.  MRN: [id number]  Referring Provider: Rusty Aus, MD  DOB: 08-13-1946  PCP: Rusty Aus, MD  DOS: 06/09/2018  Note by: Gillis Santa, MD  Service setting: Ambulatory outpatient  Specialty: Interventional Pain Management  Patient type: Established  Location: [facility] (AMB) Pain Management Facility  Visit type: Interventional Procedure   Primary Reason for Visit: Interventional Pain Management Treatment. CC: Knee Pain (left) and Hip Pain (left )  Procedure:          Anesthesia, Analgesia, Anxiolysis:  Type: Diagnostic Sacroiliac Joint Steroid Injection          Region: Inferior Lumbosacral Region Level: PIIS (Posterior Inferior Iliac Spine) Laterality: Left-Sided  Type: Local Anesthesia Indication(s): Analgesia         Route: Infiltration ([place]/IM) IV Access: Declined Sedation: Declined  Local Anesthetic: Lidocaine 1-2%   Indications: 1. Primary osteoarthritis of left knee   2. Chronic left SI joint pain   3. SI joint arthritis (LEFT)    Pain Score: Pre-procedure: 1 (knee = 1 and hip = 10.)/10 Post-procedure: 0-No pain(0 knee. 5 hip)/10  Pre-op Assessment:  Ricardo Winters is a 72 y.o. (year old), male patient, seen today for interventional treatment. He  has a past surgical history that includes Shoulder acromioplasty and Colonoscopy with propofol (N/A, 12/12/2015). Ricardo Winters has a current medication list which includes the following prescription(s): amlodipine, amoxicillin, aspirin, coq10, collagen, nitroglycerin, pravastatin, sildenafil, vitamin b-12, and turmeric, and the following Facility-Administered Medications: lidocaine. His primarily concern today is the Knee Pain (left) and Hip Pain (left )  Initial Vital Signs:  Pulse/HCG Rate: (!) 54  Temp: 97.6 F (36.4 C) Resp: 16 BP: (!) 161/90 SpO2: 100 %  BMI: Estimated body mass index is 29.92 kg/m as calculated from the following:   Height as of this encounter: 6\' 2"  (1.88 m).   Weight as of  this encounter: 233 lb (105.7 kg).  Risk Assessment: Allergies: Reviewed. He has No Known Allergies.  Allergy Precautions: None required Coagulopathies: Reviewed. None identified.  Blood-thinner therapy: None at this time Active Infection(s): Reviewed. None identified. Ricardo Winters is afebrile  Site Confirmation: Ricardo Winters was asked to confirm the procedure and laterality before marking the site Procedure checklist: Completed Consent: Before the procedure and under the influence of no sedative(s), amnesic(s), or anxiolytics, the patient was informed of the treatment options, risks and possible complications. To fulfill our ethical and legal obligations, as recommended by the American Medical Association's Code of Ethics, I have informed the patient of my clinical impression; the nature and purpose of the treatment or procedure; the risks, benefits, and possible complications of the intervention; the alternatives, including doing nothing; the risk(s) and benefit(s) of the alternative treatment(s) or procedure(s); and the risk(s) and benefit(s) of doing nothing. The patient was provided information about the general risks and possible complications associated with the procedure. These may include, but are not limited to: failure to achieve desired goals, infection, bleeding, organ or nerve damage, allergic reactions, paralysis, and death. In addition, the patient was informed of those risks and complications associated to the procedure, such as failure to decrease pain; infection; bleeding; organ or nerve damage with subsequent damage to sensory, motor, and/or autonomic systems, resulting in permanent pain, numbness, and/or weakness of one or several areas of the body; allergic reactions; (i.e.: anaphylactic reaction); and/or death. Furthermore, the patient was informed of those risks and complications associated with the medications. These include, but are not limited to: allergic  reactions (i.e.:  anaphylactic or anaphylactoid reaction(s)); adrenal axis suppression; blood sugar elevation that in diabetics may result in ketoacidosis or comma; water retention that in patients with history of congestive heart failure may result in shortness of breath, pulmonary edema, and decompensation with resultant heart failure; weight gain; swelling or edema; medication-induced neural toxicity; particulate matter embolism and blood vessel occlusion with resultant organ, and/or nervous system infarction; and/or aseptic necrosis of one or more joints. Finally, the patient was informed that Medicine is not an exact science; therefore, there is also the possibility of unforeseen or unpredictable risks and/or possible complications that may result in a catastrophic outcome. The patient indicated having understood very clearly. We have given the patient no guarantees and we have made no promises. Enough time was given to the patient to ask questions, all of which were answered to the patient's satisfaction. Ricardo Winters has indicated that he wanted to continue with the procedure. Attestation: I, the ordering provider, attest that I have discussed with the patient the benefits, risks, side-effects, alternatives, likelihood of achieving goals, and potential problems during recovery for the procedure that I have provided informed consent. Date  Time: 06/09/2018  8:48 AM  Pre-Procedure Preparation:  Monitoring: As per clinic protocol. Respiration, ETCO2, SpO2, BP, heart rate and rhythm monitor placed and checked for adequate function Safety Precautions: Patient was assessed for positional comfort and pressure points before starting the procedure. Time-out: I initiated and conducted the "Time-out" before starting the procedure, as per protocol. The patient was asked to participate by confirming the accuracy of the "Time Out" information. Verification of the correct person, site, and procedure were performed and confirmed by me,  the nursing staff, and the patient. "Time-out" conducted as per Joint Commission's Universal Protocol (UP.01.01.01). Time: 0948  Description of Procedure:          Position: Prone Target Area: Inferior, posterior, aspect of the sacroiliac fissure Approach: Posterior, paraspinal, ipsilateral approach. Area Prepped: Entire Lower Lumbosacral Region Prepping solution: ChloraPrep (2% chlorhexidine gluconate and 70% isopropyl alcohol) Safety Precautions: Aspiration looking for blood return was conducted prior to all injections. At no point did we inject any substances, as a needle was being advanced. No attempts were made at seeking any paresthesias. Safe injection practices and needle disposal techniques used. Medications properly checked for expiration dates. SDV (single dose vial) medications used. Description of the Procedure: Protocol guidelines were followed. The patient was placed in position over the procedure table. The target area was identified and the area prepped in the usual manner. Skin & deeper tissues infiltrated with local anesthetic. Appropriate amount of time allowed to pass for local anesthetics to take effect. The procedure needle was advanced under fluoroscopic guidance into the sacroiliac joint until a firm endpoint was obtained. Proper needle placement secured. Negative aspiration confirmed. Solution injected in intermittent fashion, asking for systemic symptoms every 0.5cc of injectate. The needles were then removed and the area cleansed, making sure to leave some of the prepping solution back to take advantage of its long term bactericidal properties. Vitals:   06/09/18 0950 06/09/18 1000 06/09/18 1005 06/09/18 1010  BP: (!) 178/92 (!) 188/94 (!) 180/96 (!) 183/94  Pulse: 64 (!) 59 63 62  Resp: 17 16 16 16   Temp:      TempSrc:      SpO2: 98% 99% 98% 99%  Weight:      Height:        Start Time: 0948 hrs. End Time: 1006 hrs. Materials:  Needle(s)  Type: Regular  needle Gauge: 22G Length: 3.5-in Medication(s): Please see orders for medications and dosing details. 5 cc solution containing 4 cc of 0.2% ropivacaine, 1 cc of Decadron 10 mg/cc.  2.5 cc injected intra-articular, 2.5 cc injected periarticular. Imaging Guidance (Non-Spinal):          Type of Imaging Technique: Fluoroscopy Guidance (Non-Spinal) Indication(s): Assistance in needle guidance and placement for procedures requiring needle placement in or near specific anatomical locations not easily accessible without such assistance. Exposure Time: Please see nurses notes. Contrast: Before injecting any contrast, we confirmed that the patient did not have an allergy to iodine, shellfish, or radiological contrast. Once satisfactory needle placement was completed at the desired level, radiological contrast was injected. Contrast injected under live fluoroscopy. No contrast complications. See chart for type and volume of contrast used. Fluoroscopic Guidance: I was personally present during the use of fluoroscopy. "Tunnel Vision Technique" used to obtain the best possible view of the target area. Parallax error corrected before commencing the procedure. "Direction-depth-direction" technique used to introduce the needle under continuous pulsed fluoroscopy. Once target was reached, antero-posterior, oblique, and lateral fluoroscopic projection used confirm needle placement in all planes. Images permanently stored in EMR. Interpretation: I personally interpreted the imaging intraoperatively. Adequate needle placement confirmed in multiple planes. Appropriate spread of contrast into desired area was observed. No evidence of afferent or efferent intravascular uptake. Permanent images saved into the patient's record.  Antibiotic Prophylaxis:   Anti-infectives (From admission, onward)   None     Indication(s): None identified  Post-operative Assessment:  Post-procedure Vital Signs:  Pulse/HCG Rate: 62  Temp:  97.6 F (36.4 C) Resp: 16 BP: (!) 183/94 SpO2: 99 %  EBL: None  Complications: No immediate post-treatment complications observed by team, or reported by patient.  Note: The patient tolerated the entire procedure well. A repeat set of vitals were taken after the procedure and the patient was kept under observation following institutional policy, for this type of procedure. Post-procedural neurological assessment was performed, showing return to baseline, prior to discharge. The patient was provided with post-procedure discharge instructions, including a section on how to identify potential problems. Should any problems arise concerning this procedure, the patient was given instructions to immediately contact us, at any time, without hesitation. In any case, we plan to contact the patient by telephone for a follow-up status report regarding this interventional procedure.  Comments:  No additional relevant information.  Plan of Care   Imaging Orders     DG C-Arm 1-60 Min-No Report Procedure Orders    No procedure(s) ordered today    Medications ordered for procedure: Meds ordered this encounter  Medications  . lidocaine (XYLOCAINE) 2 % (with pres) injection 400 mg  . ropivacaine (PF) 2 mg/mL (0.2%) (NAROPIN) injection 10 mL  . lidocaine (XYLOCAINE) 2 % (with pres) injection 400 mg  . dexamethasone (DECADRON) injection 10 mg  . dexamethasone (DECADRON) injection 10 mg   Medications administered: We administered ropivacaine (PF) 2 mg/mL (0.2%), lidocaine, dexamethasone, and dexamethasone.  See the medical record for exact dosing, route, and time of administration.  New Prescriptions   No medications on file   Disposition: Discharge home  Discharge Date & Time: 06/09/2018; 1015 hrs.   Physician-requested Follow-up: Return in about 4 weeks (around 07/07/2018) for Post Procedure Evaluation.  Future Appointments  Date Time Provider [place]  07/07/2018  1:15 PM Gillis Santa,  MD [facility] None   Primary Care Physician: Rusty Aus, MD Location: [facility]  Pain Management Facility Note by: Gillis Santa, MD Date: 06/09/2018; Time: 10:42 AM  Disclaimer:  Medicine is not an exact science. The only guarantee in medicine is that nothing is guaranteed. It is important to note that the decision to proceed with this intervention was based on the information collected from the patient. The Data and conclusions were drawn from the patient's questionnaire, the interview, and the physical examination. Because the information was provided in large part by the patient, it cannot be guaranteed that it has not been purposely or unconsciously manipulated. Every effort has been made to obtain as much relevant data as possible for this evaluation. It is important to note that the conclusions that lead to this procedure are derived in large part from the available data. Always take into account that the treatment will also be dependent on availability of resources and existing treatment guidelines, considered by other Pain Management Practitioners as being common knowledge and practice, at the time of the intervention. For Medico-Legal purposes, it is also important to point out that variation in procedural techniques and pharmacological choices are the acceptable norm. The indications, contraindications, technique, and results of the above procedure should only be interpreted and judged by a Board-Certified Interventional Pain Specialist with extensive familiarity and expertise in the same exact procedure and technique.

## 2018-06-09 NOTE — Patient Instructions (Signed)
Pain Management Discharge Instructions  General Discharge Instructions :  If you need to reach your doctor call: Monday-Friday 8:00 am - 4:00 pm at [phone] or toll free [phone].  After clinic hours [phone] to have operator reach doctor.  Bring all of your medication bottles to all your appointments in the pain clinic.  To cancel or reschedule your appointment with Pain Management please remember to call 24 hours in advance to avoid a fee.  Refer to the educational materials which you have been given on: General Risks, I had my Procedure. Discharge Instructions, Post Sedation.  Post Procedure Instructions:  The drugs you were given will stay in your system until tomorrow, so for the next 24 hours you should not drive, make any legal decisions or drink any alcoholic beverages.  You may eat anything you prefer, but it is better to start with liquids then soups and crackers, and gradually work up to solid foods.  Please notify your doctor immediately if you have any unusual bleeding, trouble breathing or pain that is not related to your normal pain.  Depending on the type of procedure that was done, some parts of your body may feel week and/or numb.  This usually clears up by tonight or the next day.  Walk with the use of an assistive device or accompanied by an adult for the 24 hours.  You may use ice on the affected area for the first 24 hours.  Put ice in a Ziploc bag and cover with a towel and place against area 15 minutes on 15 minutes off.  You may switch to heat after 24 hours.Knee Injection A knee injection is a procedure to get medicine into your knee joint. Your health care provider puts a needle into the joint and injects medicine with an attached syringe. The injected medicine may relieve the pain, swelling, and stiffness of arthritis. The injected medicine may also help to lubricate and cushion your knee joint. You may need more than one injection. Tell a health  care provider about:  Any allergies you have.  All medicines you are taking, including vitamins, herbs, eye drops, creams, and over-the-counter medicines.  Any problems you or family members have had with anesthetic medicines.  Any blood disorders you have.  Any surgeries you have had.  Any medical conditions you have. What are the risks? Generally, this is a safe procedure. However, problems may occur, including:  Infection.  Bleeding.  Worsening symptoms.  Damage to the area around your knee.  Allergic reaction to any of the medicines.  Skin reactions from repeated injections.  What happens before the procedure?  Ask your health care provider about changing or stopping your regular medicines. This is especially important if you are taking diabetes medicines or blood thinners.  Plan to have someone take you home after the procedure. What happens during the procedure?  You will sit or lie down in a position for your knee to be treated.  The skin over your kneecap will be cleaned with a germ-killing solution (antiseptic).  You will be given a medicine that numbs the area (local anesthetic). You may feel some stinging.  After your knee becomes numb, you will have a second injection. This is the medicine. This needle is carefully placed between your kneecap and your knee. The medicine is injected into the joint space.  At the end of the procedure, the needle will be removed.  A bandage (dressing) may be placed over the injection site. The procedure may  vary among health care providers and hospitals. What happens after the procedure?  You may have to move your knee through its full range of motion. This helps to get all of the medicine into your joint space.  Your blood pressure, heart rate, breathing rate, and blood oxygen level will be monitored often until the medicines you were given have worn off.  You will be watched to make sure that you do not have a reaction  to the injected medicine. This information is not intended to replace advice given to you by your health care provider. Make sure you discuss any questions you have with your health care provider. Document Released: 01/06/2007 Document Revised: 03/16/2016 Document Reviewed: 08/25/2014 Elsevier Interactive Patient Education  2018 [employer].

## 2018-06-10 ENCOUNTER — Telehealth: Payer: Self-pay

## 2018-06-10 NOTE — Telephone Encounter (Signed)
Post procedure phone call.  LM 

## 2018-06-16 ENCOUNTER — Telehealth: Payer: Self-pay | Admitting: Student in an Organized Health Care Education/Training Program

## 2018-06-16 NOTE — Telephone Encounter (Signed)
Having extreme pain in left hip, he recently had injection in SI joint and knee. Knee is feeling good, but pain in hip is excrutiating. Please call with suggestions, does he need another injection? Would like to get some relief before appt sched on 9-9

## 2018-07-07 ENCOUNTER — Ambulatory Visit: Payer: Medicare HMO | Admitting: Student in an Organized Health Care Education/Training Program

## 2018-08-14 ENCOUNTER — Other Ambulatory Visit: Payer: Self-pay | Admitting: Sports Medicine

## 2018-08-14 DIAGNOSIS — M7918 Myalgia, other site: Secondary | ICD-10-CM

## 2018-08-14 DIAGNOSIS — M1612 Unilateral primary osteoarthritis, left hip: Secondary | ICD-10-CM

## 2018-08-14 DIAGNOSIS — M25552 Pain in left hip: Secondary | ICD-10-CM

## 2018-08-14 DIAGNOSIS — G5702 Lesion of sciatic nerve, left lower limb: Secondary | ICD-10-CM

## 2018-08-30 ENCOUNTER — Ambulatory Visit: Payer: Medicare HMO

## 2018-09-07 ENCOUNTER — Ambulatory Visit: Payer: Medicare HMO

## 2019-02-18 ENCOUNTER — Other Ambulatory Visit: Payer: Self-pay | Admitting: Neurology

## 2019-02-18 ENCOUNTER — Other Ambulatory Visit (HOSPITAL_COMMUNITY): Payer: Self-pay | Admitting: Neurology

## 2019-02-18 DIAGNOSIS — M25552 Pain in left hip: Secondary | ICD-10-CM

## 2019-03-04 DIAGNOSIS — Z0000 Encounter for general adult medical examination without abnormal findings: Secondary | ICD-10-CM | POA: Insufficient documentation

## 2019-04-06 ENCOUNTER — Ambulatory Visit
Admission: RE | Admit: 2019-04-06 | Discharge: 2019-04-06 | Disposition: A | Discharge status: Home / Self Care | Payer: Medicare Other | Source: Ambulatory Visit | Attending: Neurology | Admitting: Neurology

## 2019-04-06 ENCOUNTER — Other Ambulatory Visit: Payer: Self-pay

## 2019-04-06 DIAGNOSIS — M25552 Pain in left hip: Secondary | ICD-10-CM | POA: Diagnosis not present

## 2019-04-20 ENCOUNTER — Other Ambulatory Visit: Payer: Self-pay

## 2019-04-28 ENCOUNTER — Encounter: Payer: Self-pay | Admitting: Student in an Organized Health Care Education/Training Program

## 2019-04-28 ENCOUNTER — Ambulatory Visit
Payer: Medicare Other | Attending: Student in an Organized Health Care Education/Training Program | Admitting: Student in an Organized Health Care Education/Training Program

## 2019-04-28 ENCOUNTER — Other Ambulatory Visit: Payer: Self-pay

## 2019-04-28 VITALS — BP 160/88 | HR 59 | Temp 98.1°F | Resp 16 | Ht 74.0 in | Wt 233.0 lb

## 2019-04-28 DIAGNOSIS — M1712 Unilateral primary osteoarthritis, left knee: Secondary | ICD-10-CM | POA: Diagnosis not present

## 2019-04-28 DIAGNOSIS — M5136 Other intervertebral disc degeneration, lumbar region: Secondary | ICD-10-CM

## 2019-04-28 DIAGNOSIS — G8929 Other chronic pain: Secondary | ICD-10-CM

## 2019-04-28 DIAGNOSIS — M1612 Unilateral primary osteoarthritis, left hip: Secondary | ICD-10-CM | POA: Diagnosis not present

## 2019-04-28 DIAGNOSIS — G894 Chronic pain syndrome: Secondary | ICD-10-CM

## 2019-04-28 DIAGNOSIS — M5416 Radiculopathy, lumbar region: Secondary | ICD-10-CM

## 2019-04-28 DIAGNOSIS — M25552 Pain in left hip: Secondary | ICD-10-CM

## 2019-04-28 NOTE — Progress Notes (Signed)
Safety precautions to be maintained throughout the outpatient stay will include: orient to surroundings, keep bed in low position, maintain call bell within reach at all times, provide assistance with transfer out of bed and ambulation.  

## 2019-04-28 NOTE — Progress Notes (Signed)
Patient's Name: Ricardo Winters.  MRN: [id number]  Referring Provider: Rusty Aus, MD  DOB: 04/03/1946  PCP: Rusty Aus, MD  DOS: 04/28/2019  Note by: Gillis Santa, MD  Service setting: Ambulatory outpatient  Attending: Gillis Santa, MD  Location: [facility] (AMB) [facility]  Specialty: Interventional Pain Management  Patient type: Established   Primary Reason(s) for Visit: Evaluation of chronic illnesses with exacerbation, or progression (Level of risk: moderate) CC: Knee Pain (left)  HPI  Ricardo Winters is a 73 y.o. year old, male patient, who comes today for a follow-up evaluation. He has Syncope; Elevated troponin; CAD (coronary artery disease); HTN (hypertension); Primary osteoarthritis of left hip; Status post total replacement of right hip; Chronic left hip pain; Primary osteoarthritis of left knee; Chronic left SI joint pain; and SI joint arthritis (LEFT) on their problem list. Ricardo Winters was last seen on Visit date not found. His primarily concern today is the Knee Pain (left)  Pain Assessment: Location: Left Knee Radiating: denies Onset: More than a month ago Duration: Chronic pain Quality: Discomfort, Sharp Severity: 0-No pain/10 (subjective, self-reported pain score)  Note: Reported level is compatible with observation.                         When using our objective Pain Scale, levels between 6 and 10/10 are said to belong in an emergency room, as it progressively worsens from a 6/10, described as severely limiting, requiring emergency care not usually available at an outpatient pain management facility. At a 6/10 level, communication becomes difficult and requires great effort. Assistance to reach the emergency department may be required. Facial flushing and profuse sweating along with potentially dangerous increases in heart rate and blood pressure will be evident. Effect on ADL: when golfing, sometimes will start the pain, walking downhill aggravates the  knee Timing: Intermittent Modifying factors: procedure BP: (!) 160/88  HR: (!) 59  Further details on both, my assessment(s), as well as the proposed treatment plan, please see below.  Patient follows up today with a chief complaint of left hip pain.  Patient is status post left hip replacement surgery January 2020.  He states that his right hip replacement surgery went very well and he noticed significant benefit from it however he is not experiencing similar results after his left hip replacement surgery.  He still has occasional pain along his posterior lateral hip that does radiate down to his posterior lateral thigh.  He has not followed up with his orthopedist yet.  He did have a lumbar MRI performed which showed disc herniation at L5-S1 on the left which could be contributing to his symptoms.  In order to better delineate whether his left hip pain is stemming from a radicular source versus intra-articular hip pathology, discussed diagnostic left-sided lumbar epidural steroid injection.  Risks and benefits reviewed and patient like to proceed.  Laboratory Chemistry   SAFETY SCREENING Profile No results found for: SARSCOV2NAA, COVIDSOURCE, STAPHAUREUS, MRSAPCR, HCVAB, HIV, PREGTESTUR Inflammation Markers (CRP: Acute Phase) (ESR: Chronic Phase) No results found for: CRP, ESRSEDRATE, LATICACIDVEN                       Rheumatology Markers No results found for: RF, ANA, LABURIC, URICUR, LYMEIGGIGMAB, LYMEABIGMQN, HLAB27                      Renal Function Markers Lab Results  Component Value Date  BUN 18 03/10/2018   CREATININE 1.14 03/10/2018   GFRAA >60 03/10/2018   GFRNONAA >60 03/10/2018                             Hepatic Function Markers Lab Results  Component Value Date   AST 18 03/10/2018   ALT 16 (L) 03/10/2018   ALBUMIN 3.2 (L) 03/10/2018   ALKPHOS 57 03/10/2018                        Electrolytes Lab Results  Component Value Date   NA 140 03/10/2018   K 3.9  03/10/2018   CL 108 03/10/2018   CALCIUM 8.6 (L) 03/10/2018                        Neuropathy Markers Lab Results  Component Value Date   HGBA1C 5.4 08/02/2014                        CNS Tests No results found for: COLORCSF, APPEARCSF, RBCCOUNTCSF, WBCCSF, POLYSCSF, LYMPHSCSF, EOSCSF, PROTEINCSF, GLUCCSF, JCVIRUS, CSFOLI, IGGCSF, LABACHR, ACETBL                      Bone Pathology Markers No results found for: VD25OH, [id number], [id number], [id number], 25OHVITD1, 25OHVITD2, 25OHVITD3, TESTOFREE, TESTOSTERONE                       Coagulation Parameters Lab Results  Component Value Date   INR 0.9 08/02/2014   LABPROT 12.2 08/02/2014   APTT 28.5 08/02/2014   PLT 141 (L) 03/10/2018                        Cardiovascular Markers Lab Results  Component Value Date   CKTOTAL 298 08/03/2014   CKMB 42.1 (H) 08/02/2014   TROPONINI 0.03 (HH) 03/10/2018   HGB 13.9 03/10/2018   HCT 39.5 (L) 03/10/2018                         ID Test(s) No results found for: LYMEIGGIGMAB, HIV, SARSCOV2NAA, STAPHAUREUS, MRSAPCR, HCVAB, PREGTESTUR, MICROTEXT  CA Markers No results found for: CEA, CA125, LABCA2                      Endocrine Markers Lab Results  Component Value Date   TSH 2.477 03/18/2017                        Note: Lab results reviewed.  Imaging Review  Lumbosacral Imaging: Lumbar MR wo contrast:  Results for orders placed during the hospital encounter of 04/06/19  [name]   Narrative CLINICAL DATA:  Left hip pain. Status post hip replacement 4 months ago. No low back pain with persistent left hip pain.  EXAM: MRI LUMBAR SPINE WITHOUT CONTRAST  TECHNIQUE: Multiplanar, multisequence MR imaging of the lumbar spine was performed. No intravenous contrast was administered.  COMPARISON:  None.  FINDINGS: Segmentation: 5 non rib-bearing lumbar type vertebral bodies are present. The lowest fully formed vertebral body is L5.  Alignment: AP alignment  is anatomic. No significant curvature is present.  Vertebrae: Hemangiomas are present at L2 and L3. Marrow signal and vertebral body heights are normal.  Conus medullaris and cauda equina: Conus  extends to the L1 level. Conus and cauda equina appear normal.  Paraspinal and other soft tissues: Limited imaging the abdomen is unremarkable. There is no significant adenopathy. No solid organ lesions are present.  Disc levels:  Relatively short pedicles are present throughout the lumbar spine.  L1-2: Negative.  L2-3: Mild facet hypertrophy is present. No focal disc protrusion or stenosis is present.  L3-4: Mild disc bulging is present. Moderate facet hypertrophy and ligamentum flavum thickening is noted. This leads to mild foraminal narrowing bilaterally.  L4-5: A broad-based disc protrusion is present. Moderate facet hypertrophy is noted bilaterally. This leads to moderate central canal stenosis with right greater than left subarticular narrowing. A central annular tear is noted. A synovial cyst extends posteriorly from the right facet joint. Mild foraminal narrowing is evident bilaterally.  L5-S1: A left paramedian disc protrusion is present. Moderate facet hypertrophy is noted. This leads to moderate left subarticular narrowing with impact on the left S1 nerve roots. There is mild right subarticular narrowing. Moderate foraminal narrowing is present bilaterally.  IMPRESSION: 1. The most significant left-sided disease is at L5-S1 where a left paramedian disc protrusion results in moderate left subarticular narrowing, likely impacting the traversing left S1 nerve roots. 2. Moderate bilateral foraminal narrowing at L5-S1. 3. Right greater than left subarticular narrowing at L4-5 with mild foraminal narrowing bilaterally. 4. Mild bilateral foraminal narrowing at L3-4. 5. Multilevel facet disease as described.   Electronically Signed   By: San Morelle M.D.   On:  04/06/2019 08:27    Sacroiliac Joint Imaging: Sacroiliac Joint DG:  Results for orders placed during the hospital encounter of 05/13/18  DG Si Joints   Narrative CLINICAL DATA:  Left hip pain for 1 year.  EXAM: BILATERAL SACROILIAC JOINTS - 3+ VIEW  COMPARISON:  None.  FINDINGS: Sacrum is intact. Mild degenerative changes are noted at the SI joints bilaterally.  Right total hip arthroplasty is present. Right hip is located. Moderate degenerative changes are noted at the left hip.  IMPRESSION: 1. Mild degenerative changes involving the SI joints bilaterally. 2. Moderate degenerative change of the left hip. 3. Right total hip arthroplasty without evidence for complication.   Electronically Signed   By: San Morelle M.D.   On: 05/14/2018 09:07    Complexity Note: Imaging results reviewed. Results shared with Mr. Massie, using Layman's terms.                         Meds   Current Outpatient Medications:  .  amLODipine (NORVASC) 2.5 MG tablet, Take 2.5 mg by mouth daily., Disp: , Rfl:  .  amoxicillin (AMOXIL) 500 MG capsule, Take 500 mg by mouth PRO. Prior to dental work, Disp: , Rfl:  .  aspirin 325 MG EC tablet, Take 325 mg by mouth daily., Disp: , Rfl:  .  nitroGLYCERIN (NITROSTAT) 0.4 MG SL tablet, Place 0.4 mg under the tongue every 5 (five) minutes as needed for chest pain. Reported on 12/12/2015, Disp: , Rfl:  .  pravastatin (PRAVACHOL) 20 MG tablet, Take 20 mg by mouth daily., Disp: , Rfl:  .  sildenafil (REVATIO) 20 MG tablet, Take 20 mg by mouth daily as needed (ED). , Disp: , Rfl:  .  vitamin B-12 (CYANOCOBALAMIN) 500 MCG tablet, Take 500 mcg by mouth daily., Disp: , Rfl:  .  Coenzyme Q10 (COQ10) 100 MG CAPS, Take by mouth., Disp: , Rfl:  .  COLLAGEN PO, Take 1 Scoop by mouth.,  Disp: , Rfl:  .  TURMERIC PO, Take 900 mg by mouth daily., Disp: , Rfl:   ROS  Constitutional: Denies any fever or chills Gastrointestinal: No reported hemesis, hematochezia,  vomiting, or acute GI distress Musculoskeletal: Denies any acute onset joint swelling, redness, loss of ROM, or weakness Neurological: No reported episodes of acute onset apraxia, aphasia, dysarthria, agnosia, amnesia, paralysis, loss of coordination, or loss of consciousness  Allergies  Mr. Stick has No Known Allergies.  PFSH  Drug: Mr. Hubbert  reports no history of drug use. Alcohol:  reports current alcohol use of about 4.0 standard drinks of alcohol per week. Tobacco:  reports that he has never smoked. He has never used smokeless tobacco. Medical:  has a past medical history of Anginal pain ([place]), Cancer ([place]), Coronary artery disease, Edema, Hypertension, Myocardial infarction ([place]), and Obesity. Surgical: Mr. Hansson  has a past surgical history that includes Shoulder acromioplasty and Colonoscopy with propofol (N/A, 12/12/2015). Family: family history is not on file.  Constitutional Exam  General appearance: Well nourished, well developed, and well hydrated. In no apparent acute distress Vitals:   04/28/19 1213  BP: (!) 160/88  Pulse: (!) 59  Resp: 16  Temp: 98.1 F (36.7 C)  TempSrc: Oral  SpO2: 100%  Weight: 233 lb (105.7 kg)  Height: _0  (1.88 m)   BMI Assessment: Estimated body mass index is 29.92 kg/m as calculated from the following:   Height as of this encounter: _1  (1.88 m).   Weight as of this encounter: 233 lb (105.7 kg).  BMI interpretation table: BMI level Category Range association with higher incidence of chronic pain  <18 kg/m2 Underweight   18.5-24.9 kg/m2 Ideal body weight   25-29.9 kg/m2 Overweight Increased incidence by 20%  30-34.9 kg/m2 Obese (Class I) Increased incidence by 68%  35-39.9 kg/m2 Severe obesity (Class II) Increased incidence by 136%  >40 kg/m2 Extreme obesity (Class III) Increased incidence by 254%   Patient's current BMI Ideal Body weight  Body mass index is 29.92 kg/m. Ideal body weight: 82.2 kg (181 lb 3.5 oz) Adjusted  ideal body weight: 91.6 kg (201 lb 14.9 oz)   BMI Readings from Last 4 Encounters:  04/28/19 29.92 kg/m  06/09/18 29.92 kg/m  06/02/18 29.92 kg/m  05/05/18 30.69 kg/m   Wt Readings from Last 4 Encounters:  04/28/19 233 lb (105.7 kg)  06/09/18 233 lb (105.7 kg)  06/02/18 233 lb (105.7 kg)  05/05/18 239 lb (108.4 kg)  Psych/Mental status: Alert, oriented x 3 (person, place, & time)       Eyes: PERLA Respiratory: No evidence of acute respiratory distress  Cervical Spine Area Exam  Skin & Axial Inspection: No masses, redness, edema, swelling, or associated skin lesions Alignment: Symmetrical Functional ROM: Unrestricted ROM      Stability: No instability detected Muscle Tone/Strength: Functionally intact. No obvious neuro-muscular anomalies detected. Sensory (Neurological): Unimpaired Palpation: No palpable anomalies              Upper Extremity (UE) Exam    Side: Right upper extremity  Side: Left upper extremity  Skin & Extremity Inspection: Skin color, temperature, and hair growth are WNL. No peripheral edema or cyanosis. No masses, redness, swelling, asymmetry, or associated skin lesions. No contractures.  Skin & Extremity Inspection: Skin color, temperature, and hair growth are WNL. No peripheral edema or cyanosis. No masses, redness, swelling, asymmetry, or associated skin lesions. No contractures.  Functional ROM: Unrestricted ROM  Functional ROM: Unrestricted ROM          Muscle Tone/Strength: Functionally intact. No obvious neuro-muscular anomalies detected.  Muscle Tone/Strength: Functionally intact. No obvious neuro-muscular anomalies detected.  Sensory (Neurological): Unimpaired          Sensory (Neurological): Unimpaired          Palpation: No palpable anomalies              Palpation: No palpable anomalies              Provocative Test(s):  Phalen's test: deferred Tinel's test: deferred Apley's scratch test (touch opposite shoulder):  Action 1 (Across chest):  deferred Action 2 (Overhead): deferred Action 3 (LB reach): deferred   Provocative Test(s):  Phalen's test: deferred Tinel's test: deferred Apley's scratch test (touch opposite shoulder):  Action 1 (Across chest): deferred Action 2 (Overhead): deferred Action 3 (LB reach): deferred    Thoracic Spine Area Exam  Skin & Axial Inspection: No masses, redness, or swelling Alignment: Symmetrical Functional ROM: Unrestricted ROM Stability: No instability detected Muscle Tone/Strength: Functionally intact. No obvious neuro-muscular anomalies detected. Sensory (Neurological): Unimpaired Muscle strength & Tone: No palpable anomalies  Lumbar Spine Area Exam  Skin & Axial Inspection: No masses, redness, or swelling Alignment: Symmetrical Functional ROM: Unrestricted ROM       Stability: No instability detected Muscle Tone/Strength: Functionally intact. No obvious neuro-muscular anomalies detected. Sensory (Neurological): Unimpaired Palpation: No palpable anomalies       Provocative Tests: Hyperextension/rotation test: deferred today       Lumbar quadrant test (Kemp's test): deferred today       Lateral bending test: deferred today       Patrick's Maneuver: deferred today                   FABER* test: deferred today                   S-I anterior distraction/compression test: deferred today         S-I lateral compression test: deferred today         S-I Thigh-thrust test: deferred today         S-I Gaenslen's test: deferred today         *(Flexion, ABduction and External Rotation)  Gait & Posture Assessment  Ambulation: Unassisted Gait: Relatively normal for age and body habitus Posture: WNL   Lower Extremity Exam    Side: Right lower extremity  Side: Left lower extremity  Stability: No instability observed          Stability: No instability observed          Skin & Extremity Inspection: Evidence of prior arthroplastic surgery  Skin & Extremity Inspection: Evidence of prior  arthroplastic surgery  Functional ROM: Unrestricted ROM                  Functional ROM: Pain restricted ROM for hip and knee joints          Muscle Tone/Strength: Functionally intact. No obvious neuro-muscular anomalies detected.  Muscle Tone/Strength: Functionally intact. No obvious neuro-muscular anomalies detected.  Sensory (Neurological): Unimpaired        Sensory (Neurological): Dermatomal pain patternn and arthropatic        DTR: Patellar: deferred today Achilles: deferred today Plantar: deferred today  DTR: Patellar: deferred today Achilles: deferred today Plantar: deferred today  Palpation: No palpable anomalies  Palpation: No palpable anomalies   Assessment   Status Diagnosis  Persistent Persistent  Persistent 1. Lumbar radiculopathy   2. Lumbar degenerative disc disease   3. Primary osteoarthritis of left knee   4. Primary osteoarthritis of left hip   5. Chronic left hip pain   6. Chronic pain syndrome     Patient follows up today with a chief complaint of left hip pain.  Patient is status post left hip replacement surgery January 2020.  He states that his right hip replacement surgery went very well and he noticed significant benefit from it however he is not experiencing similar results after his left hip replacement surgery.  He still has occasional pain along his posterior lateral hip that does radiate down to his posterior lateral thigh.  He has not followed up with his orthopedist yet.  He did have a lumbar MRI performed which showed disc protrusion at L5-S1 on the left which could be contributing to his symptoms.  In order to better delineate whether his left hip pain is stemming from a radicular source versus intra-articular hip pathology, discussed diagnostic left-sided lumbar epidural steroid injection.  Risks and benefits reviewed and patient like to proceed.  L-MRI shows: left-sided disease is at L5-S1 where a left paramedian disc protrusion results in moderate left  subarticular cnarrowing, likely impacting the traversing left S1 nerve roots. Plan of Care   Orders:  Orders Placed This Encounter  Procedures  . Lumbar Epidural Injection    Standing Status:   Future    Standing Expiration Date:   05/28/2019    Scheduling Instructions:     Procedure: Interlaminar Lumbar Epidural Steroid injection (LESI)            Laterality:LEFT L5/S1     Sedation: w/o     Timeframe: ASAA    Order Specific Question:   Where will this procedure be performed?    Answer:   [facility]   Planned follow-up:   Return for Procedure: Left L5/S1 ESI w/o.    Consider repeating left genicular nerve block and possible radiofrequency ablation for left knee osteoarthritis if and when return of left knee pain. Previous left knee genicular nerve block performed 06/09/2018  Time Note: Greater than 50% of the 25 minute(s) of face-to-face time spent with Mr. Heinlen, was spent in counseling/coordination of care regarding: Mr. Macdonnell primary cause of pain, the treatment plan, treatment alternatives, the risks and possible complications of proposed treatment, realistic expectations and the goals of pain management (increased in functionality).  Recent Visits No visits were found meeting these conditions.  Showing recent visits within past 90 days and meeting all other requirements   Today's Visits Date Type Provider Dept  04/28/19 Office Visit Gillis Santa, MD [facility]  Showing today's visits and meeting all other requirements   Future Appointments Date Type Provider Dept  05/06/19 Appointment Gillis Santa, MD [facility]  Showing future appointments within next 90 days and meeting all other requirements   Primary Care Physician: Rusty Aus, MD Location: [facility] Note by: Gillis Santa, MD Date: 04/28/2019; Time: 2:41 PM  Note: This dictation was prepared with Dragon dictation. Any transcriptional errors that  may result from this process are unintentional.

## 2019-04-28 NOTE — Patient Instructions (Signed)
____________________________________________________________________________________________  Post-Procedure Discharge Instructions  Instructions:  Apply ice:   Purpose: This will minimize any swelling and discomfort after procedure.   When: Day of procedure, as soon as you get home.  How: Fill a plastic sandwich bag with crushed ice. Cover it with a small towel and apply to injection site.  How long: (15 min on, 15 min off) Apply for 15 minutes then remove x 15 minutes.  Repeat sequence on day of procedure, until you go to bed.  Apply heat:   Purpose: To treat any soreness and discomfort from the procedure.  When: Starting the next day after the procedure.  How: Apply heat to procedure site starting the day following the procedure.  How long: May continue to repeat daily, until discomfort goes away.  Food intake: Start with clear liquids (like water) and advance to regular food, as tolerated.   Physical activities: Keep activities to a minimum for the first 8 hours after the procedure. After that, then as tolerated.  Driving: If you have received any sedation, be responsible and do not drive. You are not allowed to drive for 24 hours after having sedation.  Blood thinner: (Applies only to those taking blood thinners) You may restart your blood thinner 6 hours after your procedure.  Insulin: (Applies only to Diabetic patients taking insulin) As soon as you can eat, you may resume your normal dosing schedule.  Infection prevention: Keep procedure site clean and dry. Shower daily and clean area with soap and water.  Post-procedure Pain Diary: Extremely important that this be done correctly and accurately. Recorded information will be used to determine the next step in treatment. For the purpose of accuracy, follow these rules:  Evaluate only the area treated. Do not report or include pain from an untreated area. For the purpose of this evaluation, ignore all other areas of pain,  except for the treated area.  After your procedure, avoid taking a long nap and attempting to complete the pain diary after you wake up. Instead, set your alarm clock to go off every hour, on the hour, for the initial 8 hours after the procedure. Document the duration of the numbing medicine, and the relief you are getting from it.  Do not go to sleep and attempt to complete it later. It will not be accurate. If you received sedation, it is likely that you were given a medication that may cause amnesia. Because of this, completing the diary at a later time may cause the information to be inaccurate. This information is needed to plan your care.  Follow-up appointment: Keep your post-procedure follow-up evaluation appointment after the procedure (usually 2 weeks for most procedures, 6 weeks for radiofrequencies). DO NOT FORGET to bring you pain diary with you.   Expect: (What should I expect to see with my procedure?)  From numbing medicine (AKA: Local Anesthetics): Numbness or decrease in pain. You may also experience some weakness, which if present, could last for the duration of the local anesthetic.  Onset: Full effect within 15 minutes of injected.  Duration: It will depend on the type of local anesthetic used. On the average, 1 to 8 hours.   From steroids (Applies only if steroids were used): Decrease in swelling or inflammation. Once inflammation is improved, relief of the pain will follow.  Onset of benefits: Depends on the amount of swelling present. The more swelling, the longer it will take for the benefits to be seen. In some cases, up to 10 days.    Duration: Steroids will stay in the system x 2 weeks. Duration of benefits will depend on multiple posibilities including persistent irritating factors.  Side-effects: If present, they may typically last 2 weeks (the duration of the steroids).  Frequent: Cramps (if they occur, drink Gatorade and take over-the-counter Magnesium 450-500 mg  once to twice a day); water retention with temporary weight gain; increases in blood sugar; decreased immune system response; increased appetite.  Occasional: Facial flushing (red, warm cheeks); mood swings; menstrual changes.  Uncommon: Long-term decrease or suppression of natural hormones; bone thinning. (These are more common with higher doses or more frequent use. This is why we prefer that our patients avoid having any injection therapies in other practices.)   Very Rare: Severe mood changes; psychosis; aseptic necrosis.  From procedure: Some discomfort is to be expected once the numbing medicine wears off. This should be minimal if ice and heat are applied as instructed.  Call if: (When should I call?)  You experience numbness and weakness that gets worse with time, as opposed to wearing off.  New onset bowel or bladder incontinence. (Applies only to procedures done in the spine)  Emergency Numbers:  Durning business hours (Monday - Thursday, 8:00 AM - 4:00 PM) (Friday, 9:00 AM - 12:00 Noon): [phone]  After hours: [phone]  NOTE: If you are having a problem and are unable connect with, or to talk to a provider, then go to your nearest urgent care or emergency department. If the problem is serious and urgent, please call 911. ____________________________________________________________________________________________   ____________________________________________________________________________________________  Preparing for your procedure (without sedation)  Procedure appointments are limited to planned procedures: . No Prescription Refills. . No disability issues will be discussed. . No medication changes will be discussed.  Instructions: . Oral Intake: Do not eat or drink anything for at least 3 hours prior to your procedure. . Transportation: Unless otherwise stated by your physician, you may drive yourself after the procedure. . Blood Pressure Medicine:  Take your blood pressure medicine with a sip of water the morning of the procedure. . Blood thinners: Notify our staff if you are taking any blood thinners. Depending on which one you take, there will be specific instructions on how and when to stop it. . Diabetics on insulin: Notify the staff so that you can be scheduled 1st case in the morning. If your diabetes requires high dose insulin, take only  of your normal insulin dose the morning of the procedure and notify the staff that you have done so. . Preventing infections: Shower with an antibacterial soap the morning of your procedure.  . Build-up your immune system: Take 1000 mg of Vitamin C with every meal (3 times a day) the day prior to your procedure. . Antibiotics: Inform the staff if you have a condition or reason that requires you to take antibiotics before dental procedures. . Pregnancy: If you are pregnant, call and cancel the procedure. . Sickness: If you have a cold, fever, or any active infections, call and cancel the procedure. . Arrival: You must be in the facility at least 30 minutes prior to your scheduled procedure. . Children: Do not bring any children with you. . Dress appropriately: Bring dark clothing that you would not mind if they get stained. . Valuables: Do not bring any jewelry or valuables.  Reasons to call and reschedule or cancel your procedure: (Following these recommendations will minimize the risk of a serious complication.) . Surgeries: Avoid having procedures within 2 weeks   of any surgery. (Avoid for 2 weeks before or after any surgery). . Flu Shots: Avoid having procedures within 2 weeks of a flu shots or . (Avoid for 2 weeks before or after immunizations). . Barium: Avoid having a procedure within 7-10 days after having had a radiological study involving the use of radiological contrast. (Myelograms, Barium swallow or enema study). . Heart attacks: Avoid any elective procedures or surgeries for the initial 6  months after a "Myocardial Infarction" (Heart Attack). . Blood thinners: It is imperative that you stop these medications before procedures. Let us know if you if you take any blood thinner.  . Infection: Avoid procedures during or within two weeks of an infection (including chest colds or gastrointestinal problems). Symptoms associated with infections include: Localized redness, fever, chills, night sweats or profuse sweating, burning sensation when voiding, cough, congestion, stuffiness, runny nose, sore throat, diarrhea, nausea, vomiting, cold or Flu symptoms, recent or current infections. It is specially important if the infection is over the area that we intend to treat. Marland Kitchen Heart and lung problems: Symptoms that may suggest an active cardiopulmonary problem include: cough, chest pain, breathing difficulties or shortness of breath, dizziness, ankle swelling, uncontrolled high or unusually low blood pressure, and/or palpitations. If you are experiencing any of these symptoms, cancel your procedure and contact your primary care physician for an evaluation.  Remember:  Regular Business hours are:  Monday to Thursday 8:00 AM to 4:00 PM  Provider's Schedule: Milinda Pointer, MD:  Procedure days: Tuesday and Thursday 7:30 AM to 4:00 PM  Gillis Santa, MD:  Procedure days: Monday and Wednesday 7:30 AM to 4:00 PM ____________________________________________________________________________________________  Epidural Steroid Injection Patient Information  Description: The epidural space surrounds the nerves as they exit the spinal cord.  In some patients, the nerves can be compressed and inflamed by a bulging disc or a tight spinal canal (spinal stenosis).  By injecting steroids into the epidural space, we can bring irritated nerves into direct contact with a potentially helpful medication.  These steroids act directly on the irritated nerves and can reduce swelling and inflammation which often leads to  decreased pain.  Epidural steroids may be injected anywhere along the spine and from the neck to the low back depending upon the location of your pain.   After numbing the skin with local anesthetic (like Novocaine), a small needle is passed into the epidural space slowly.  You may experience a sensation of pressure while this is being done.  The entire block usually last less than 10 minutes.  Conditions which may be treated by epidural steroids:   Low back and leg pain  Neck and arm pain  Spinal stenosis  Post-laminectomy syndrome  Herpes zoster (shingles) pain  Pain from compression fractures  Preparation for the injection:  1. Do not eat any solid food or dairy products within 8 hours of your appointment.  2. You may drink clear liquids up to 3 hours before appointment.  Clear liquids include water, black coffee, juice or soda.  No milk or cream please. 3. You may take your regular medication, including pain medications, with a sip of water before your appointment  Diabetics should hold regular insulin (if taken separately) and take 1/2 normal NPH dos the morning of the procedure.  Carry some sugar containing items with you to your appointment. 4. A driver must accompany you and be prepared to drive you home after your procedure.  5. Bring all your current medications with your. 6. An  IV may be inserted and sedation may be given at the discretion of the physician.   7. A blood pressure cuff, EKG and other monitors will often be applied during the procedure.  Some patients may need to have extra oxygen administered for a short period. 8. You will be asked to provide medical information, including your allergies, prior to the procedure.  We must know immediately if you are taking blood thinners (like Coumadin/Warfarin)  Or if you are allergic to IV iodine contrast (dye). We must know if you could possible be pregnant.  Possible side-effects:  Bleeding from needle site  Infection  (rare, may require surgery)  Nerve injury (rare)  Numbness & tingling (temporary)  Difficulty urinating (rare, temporary)  Spinal headache ( a headache worse with upright posture)  Light -headedness (temporary)  Pain at injection site (several days)  Decreased blood pressure (temporary)  Weakness in arm/leg (temporary)  Pressure sensation in back/neck (temporary)  Call if you experience:  Fever/chills associated with headache or increased back/neck pain.  Headache worsened by an upright position.  New onset weakness or numbness of an extremity below the injection site  Hives or difficulty breathing (go to the emergency room)  Inflammation or drainage at the infection site  Severe back/neck pain  Any new symptoms which are concerning to you  Please note:  Although the local anesthetic injected can often make your back or neck feel good for several hours after the injection, the pain will likely return.  It takes 3-7 days for steroids to work in the epidural space.  You may not notice any pain relief for at least that one week.  If effective, we will often do a series of three injections spaced 3-6 weeks apart to maximally decrease your pain.  After the initial series, we generally will wait several months before considering a repeat injection of the same type.  If you have any questions, please call [phone] [facility]

## 2019-05-01 ENCOUNTER — Other Ambulatory Visit
Admission: RE | Admit: 2019-05-01 | Discharge: 2019-05-01 | Disposition: A | Discharge status: Home / Self Care | Payer: Medicare Other | Source: Ambulatory Visit | Attending: Student in an Organized Health Care Education/Training Program | Admitting: Student in an Organized Health Care Education/Training Program

## 2019-05-01 ENCOUNTER — Other Ambulatory Visit: Payer: Self-pay

## 2019-05-01 DIAGNOSIS — Z1159 Encounter for screening for other viral diseases: Secondary | ICD-10-CM | POA: Diagnosis not present

## 2019-05-01 DIAGNOSIS — Z01812 Encounter for preprocedural laboratory examination: Secondary | ICD-10-CM | POA: Insufficient documentation

## 2019-05-02 LAB — SARS CORONAVIRUS 2 (TAT 6-24 HRS): SARS Coronavirus 2: NEGATIVE

## 2019-05-06 ENCOUNTER — Ambulatory Visit
Admission: RE | Admit: 2019-05-06 | Discharge: 2019-05-06 | Disposition: A | Discharge status: Home / Self Care | Payer: Medicare Other | Source: Ambulatory Visit | Attending: Student in an Organized Health Care Education/Training Program | Admitting: Student in an Organized Health Care Education/Training Program

## 2019-05-06 ENCOUNTER — Encounter: Payer: Self-pay | Admitting: Student in an Organized Health Care Education/Training Program

## 2019-05-06 ENCOUNTER — Other Ambulatory Visit: Payer: Self-pay

## 2019-05-06 ENCOUNTER — Ambulatory Visit (HOSPITAL_BASED_OUTPATIENT_CLINIC_OR_DEPARTMENT_OTHER): Payer: Medicare Other | Admitting: Student in an Organized Health Care Education/Training Program

## 2019-05-06 VITALS — BP 171/95 | HR 55 | Temp 97.6°F | Resp 16 | Ht 74.0 in | Wt 233.0 lb

## 2019-05-06 DIAGNOSIS — M5416 Radiculopathy, lumbar region: Secondary | ICD-10-CM | POA: Diagnosis not present

## 2019-05-06 MED ORDER — LIDOCAINE HCL 2 % IJ SOLN
20.0000 mL | Freq: Once | INTRAMUSCULAR | Status: AC
  Administered 2019-05-06: 400 mg

## 2019-05-06 MED ORDER — DEXAMETHASONE SODIUM PHOSPHATE 10 MG/ML IJ SOLN
INTRAMUSCULAR | Status: AC
  Filled 2019-05-06: qty 1

## 2019-05-06 MED ORDER — SODIUM CHLORIDE (PF) 0.9 % IJ SOLN
INTRAMUSCULAR | Status: AC
Start: 2019-05-06 — End: ?
  Filled 2019-05-06: qty 10

## 2019-05-06 MED ORDER — IOHEXOL 180 MG/ML  SOLN: 10.0000 mL | Freq: Once | INTRAMUSCULAR | Status: DC

## 2019-05-06 MED ORDER — ROPIVACAINE HCL 2 MG/ML IJ SOLN
INTRAMUSCULAR | Status: AC
  Filled 2019-05-06: qty 10

## 2019-05-06 MED ORDER — LIDOCAINE HCL 2 % IJ SOLN
INTRAMUSCULAR | Status: AC
  Filled 2019-05-06: qty 20

## 2019-05-06 MED ORDER — ROPIVACAINE HCL 2 MG/ML IJ SOLN
1.0000 mL | Freq: Once | INTRAMUSCULAR | Status: DC
Start: 2019-05-06 — End: 2019-05-06

## 2019-05-06 MED ORDER — SODIUM CHLORIDE 0.9% FLUSH
1.0000 mL | Freq: Once | INTRAVENOUS | Status: AC
  Administered 2019-05-06: 12:00:00

## 2019-05-06 MED ORDER — DEXAMETHASONE SODIUM PHOSPHATE 10 MG/ML IJ SOLN
10.0000 mg | Freq: Once | INTRAMUSCULAR | Status: AC
  Administered 2019-05-06: 12:00:00 10 mg

## 2019-05-06 NOTE — Patient Instructions (Signed)

## 2019-05-06 NOTE — Progress Notes (Signed)
Patient's Name: Ricardo Winters.  MRN: [id number]  Referring Provider: Rusty Aus, MD  DOB: 1946-02-10  PCP: Rusty Aus, MD  DOS: 05/06/2019  Note by: Gillis Santa, MD  Service setting: Ambulatory outpatient  Specialty: Interventional Pain Management  Patient type: Established  Location: [facility] (AMB) Pain Management Facility  Visit type: Interventional Procedure   Primary Reason for Visit: Interventional Pain Management Treatment. CC: Back Pain (lower)  Procedure:          Anesthesia, Analgesia, Anxiolysis:  Type: Diagnostic Inter-Laminar Epidural Steroid Injection  #1  Region: Lumbar Level: L5-S1 Level. Laterality: Left-Sided         Type: Local Anesthesia  Local Anesthetic: Lidocaine 1-2%  Position: Prone with head of the table was raised to facilitate breathing.   Indications: 1. Lumbar radiculopathy    Pain Score: Pre-procedure: 2 /10 Post-procedure: 0-No pain/10  Pre-op Assessment:  Mr. Luchsinger is a 73 y.o. (year old), male patient, seen today for interventional treatment. He  has a past surgical history that includes Shoulder acromioplasty and Colonoscopy with propofol (N/A, 12/12/2015). Mr. Kellogg has a current medication list which includes the following prescription(s): amlodipine, amoxicillin, aspirin, coq10, collagen, nitroglycerin, pravastatin, sildenafil, turmeric, and vitamin b-12, and the following Facility-Administered Medications: iohexol and ropivacaine (pf) 2 mg/ml (0.2%). His primarily concern today is the Back Pain (lower)  Initial Vital Signs:  Pulse/HCG Rate: (!) 55ECG Heart Rate: (!) 59 Temp: 97.6 F (36.4 C) Resp: 16 BP: (!) 160/92 SpO2: 100 %  BMI: Estimated body mass index is 29.92 kg/m as calculated from the following:   Height as of this encounter: 6\' 2"  (1.88 m).   Weight as of this encounter: 233 lb (105.7 kg).  Risk Assessment: Allergies: Reviewed. He has No Known Allergies.  Allergy Precautions: None required Coagulopathies:  Reviewed. None identified.  Blood-thinner therapy: None at this time Active Infection(s): Reviewed. None identified. Mr. Ashmead is afebrile  Site Confirmation: Mr. Yang was asked to confirm the procedure and laterality before marking the site Procedure checklist: Completed Consent: Before the procedure and under the influence of no sedative(s), amnesic(s), or anxiolytics, the patient was informed of the treatment options, risks and possible complications. To fulfill our ethical and legal obligations, as recommended by the American Medical Association's Code of Ethics, I have informed the patient of my clinical impression; the nature and purpose of the treatment or procedure; the risks, benefits, and possible complications of the intervention; the alternatives, including doing nothing; the risk(s) and benefit(s) of the alternative treatment(s) or procedure(s); and the risk(s) and benefit(s) of doing nothing. The patient was provided information about the general risks and possible complications associated with the procedure. These may include, but are not limited to: failure to achieve desired goals, infection, bleeding, organ or nerve damage, allergic reactions, paralysis, and death. In addition, the patient was informed of those risks and complications associated to Spine-related procedures, such as failure to decrease pain; infection (i.e.: Meningitis, epidural or intraspinal abscess); bleeding (i.e.: epidural hematoma, subarachnoid hemorrhage, or any other type of intraspinal or peri-dural bleeding); organ or nerve damage (i.e.: Any type of peripheral nerve, nerve root, or spinal cord injury) with subsequent damage to sensory, motor, and/or autonomic systems, resulting in permanent pain, numbness, and/or weakness of one or several areas of the body; allergic reactions; (i.e.: anaphylactic reaction); and/or death. Furthermore, the patient was informed of those risks and complications associated with the  medications. These include, but are not limited to: allergic reactions (i.e.: anaphylactic or  anaphylactoid reaction(s)); adrenal axis suppression; blood sugar elevation that in diabetics may result in ketoacidosis or comma; water retention that in patients with history of congestive heart failure may result in shortness of breath, pulmonary edema, and decompensation with resultant heart failure; weight gain; swelling or edema; medication-induced neural toxicity; particulate matter embolism and blood vessel occlusion with resultant organ, and/or nervous system infarction; and/or aseptic necrosis of one or more joints. Finally, the patient was informed that Medicine is not an exact science; therefore, there is also the possibility of unforeseen or unpredictable risks and/or possible complications that may result in a catastrophic outcome. The patient indicated having understood very clearly. We have given the patient no guarantees and we have made no promises. Enough time was given to the patient to ask questions, all of which were answered to the patient's satisfaction. Mr. Heitz has indicated that he wanted to continue with the procedure. Attestation: I, the ordering provider, attest that I have discussed with the patient the benefits, risks, side-effects, alternatives, likelihood of achieving goals, and potential problems during recovery for the procedure that I have provided informed consent. Date  Time: 05/06/2019 11:28 AM  Pre-Procedure Preparation:  Monitoring: As per clinic protocol. Respiration, ETCO2, SpO2, BP, heart rate and rhythm monitor placed and checked for adequate function Safety Precautions: Patient was assessed for positional comfort and pressure points before starting the procedure. Time-out: I initiated and conducted the "Time-out" before starting the procedure, as per protocol. The patient was asked to participate by confirming the accuracy of the "Time Out" information. Verification of  the correct person, site, and procedure were performed and confirmed by me, the nursing staff, and the patient. "Time-out" conducted as per Joint Commission's Universal Protocol (UP.01.01.01). Time: 1214  Description of Procedure:          Target Area: The interlaminar space, initially targeting the lower laminar border of the superior vertebral body. Approach: Paramedial approach. Area Prepped: Entire Posterior Lumbar Region Prepping solution: DuraPrep (Iodine Povacrylex [0.7% available iodine] and Isopropyl Alcohol, 74% w/w) Safety Precautions: Aspiration looking for blood return was conducted prior to all injections. At no point did we inject any substances, as a needle was being advanced. No attempts were made at seeking any paresthesias. Safe injection practices and needle disposal techniques used. Medications properly checked for expiration dates. SDV (single dose vial) medications used. Description of the Procedure: Protocol guidelines were followed. The procedure needle was introduced through the skin, ipsilateral to the reported pain, and advanced to the target area. Bone was contacted and the needle walked caudad, until the lamina was cleared. The epidural space was identified using "loss-of-resistance technique" with 2-3 ml of PF-NaCl (0.9% NSS), in a 5cc LOR glass syringe.  Vitals:   05/06/19 1215 05/06/19 1220 05/06/19 1225 05/06/19 1230  BP: (!) 162/90 (!) 157/98 (!) 162/104 (!) 171/95  Pulse:      Resp: 16 17 18 16   Temp:      SpO2: 98% 99% 98% 99%  Weight:      Height:        Start Time: 1214 hrs. End Time: 1225 hrs.  Materials:  Needle(s) Type: Epidural needle Gauge: 17G Length: 3.5-in Medication(s): Please see orders for medications and dosing details. 8 cc solution made of 5 cc of preservative-free saline, 2 cc of 0.2% ropivacaine, 1 cc of Decadron 10 mg/cc.  Imaging Guidance (Spinal):          Type of Imaging Technique: Fluoroscopy Guidance  (Spinal) Indication(s): Assistance in needle  guidance and placement for procedures requiring needle placement in or near specific anatomical locations not easily accessible without such assistance. Exposure Time: Please see nurses notes. Contrast: Before injecting any contrast, we confirmed that the patient did not have an allergy to iodine, shellfish, or radiological contrast. Once satisfactory needle placement was completed at the desired level, radiological contrast was injected. Contrast injected under live fluoroscopy. No contrast complications. See chart for type and volume of contrast used. Fluoroscopic Guidance: I was personally present during the use of fluoroscopy. "Tunnel Vision Technique" used to obtain the best possible view of the target area. Parallax error corrected before commencing the procedure. "Direction-depth-direction" technique used to introduce the needle under continuous pulsed fluoroscopy. Once target was reached, antero-posterior, oblique, and lateral fluoroscopic projection used confirm needle placement in all planes. Images permanently stored in EMR. Interpretation: I personally interpreted the imaging intraoperatively. Adequate needle placement confirmed in multiple planes. Appropriate spread of contrast into desired area was observed. No evidence of afferent or efferent intravascular uptake. No intrathecal or subarachnoid spread observed. Permanent images saved into the patient's record.  Antibiotic Prophylaxis:   Anti-infectives (From admission, onward)   None     Indication(s): None identified  Post-operative Assessment:  Post-procedure Vital Signs:  Pulse/HCG Rate: (!) 55(!) 58 Temp: 97.6 F (36.4 C) Resp: 16 BP: (!) 171/95 SpO2: 99 %  EBL: None  Complications: No immediate post-treatment complications observed by team, or reported by patient.  Note: The patient tolerated the entire procedure well. A repeat set of vitals were taken after the procedure  and the patient was kept under observation following institutional policy, for this type of procedure. Post-procedural neurological assessment was performed, showing return to baseline, prior to discharge. The patient was provided with post-procedure discharge instructions, including a section on how to identify potential problems. Should any problems arise concerning this procedure, the patient was given instructions to immediately contact us, at any time, without hesitation. In any case, we plan to contact the patient by telephone for a follow-up status report regarding this interventional procedure.  Comments:  No additional relevant information. 5 out of 5 strength bilateral lower extremity: Plantar flexion, dorsiflexion, knee flexion, knee extension.  Plan of Care  Orders:  Orders Placed This Encounter  Procedures  . [facility] C-ARM 1-60 MIN NO REPORT    Intraoperative interpretation by procedural physician at [facility].    Standing Status:   Standing    Number of Occurrences:   1    Order Specific Question:   Reason for exam:    Answer:   Assistance in needle guidance and placement for procedures requiring needle placement in or near specific anatomical locations not easily accessible without such assistance.    Medications ordered for procedure: Meds ordered this encounter  Medications  . iohexol (OMNIPAQUE) 180 MG/ML injection 10 mL    Must be Myelogram-compatible. If not available, you may substitute with a water-soluble, non-ionic, hypoallergenic, myelogram-compatible radiological contrast medium.  Marland Kitchen lidocaine (XYLOCAINE) 2 % (with pres) injection 400 mg  . sodium chloride flush (NS) 0.9 % injection 1 mL  . dexamethasone (DECADRON) injection 10 mg  . ropivacaine (PF) 2 mg/mL (0.2%) (NAROPIN) injection 1 mL   Medications administered: We administered lidocaine, sodium chloride flush, and dexamethasone.  See the medical record for exact dosing, route, and time  of administration.  Follow-up plan:   Return in about 4 weeks (around 06/03/2019) for Post Procedure Evaluation, virtual.     Recent Visits Date  Type Provider Dept  04/28/19 Office Visit Gillis Santa, MD [facility]  Showing recent visits within past 90 days and meeting all other requirements   Today's Visits Date Type Provider Dept  05/06/19 Procedure visit Gillis Santa, MD [facility]  Showing today's visits and meeting all other requirements   Future Appointments Date Type Provider Dept  06/03/19 Appointment Gillis Santa, MD [facility]  Showing future appointments within next 90 days and meeting all other requirements   Disposition: Discharge home  Discharge Date & Time: 05/06/2019; 1235 hrs.   Primary Care Physician: Rusty Aus, MD Location: [facility] Note by: Gillis Santa, MD Date: 05/06/2019; Time: 3:12 PM  Disclaimer:  Medicine is not an exact science. The only guarantee in medicine is that nothing is guaranteed. It is important to note that the decision to proceed with this intervention was based on the information collected from the patient. The Data and conclusions were drawn from the patient's questionnaire, the interview, and the physical examination. Because the information was provided in large part by the patient, it cannot be guaranteed that it has not been purposely or unconsciously manipulated. Every effort has been made to obtain as much relevant data as possible for this evaluation. It is important to note that the conclusions that lead to this procedure are derived in large part from the available data. Always take into account that the treatment will also be dependent on availability of resources and existing treatment guidelines, considered by other Pain Management Practitioners as being common knowledge and practice, at the time of the intervention. For Medico-Legal purposes, it is also important to  point out that variation in procedural techniques and pharmacological choices are the acceptable norm. The indications, contraindications, technique, and results of the above procedure should only be interpreted and judged by a Board-Certified Interventional Pain Specialist with extensive familiarity and expertise in the same exact procedure and technique.

## 2019-05-07 ENCOUNTER — Telehealth: Payer: Self-pay

## 2019-05-07 NOTE — Telephone Encounter (Signed)
Post procedure phone call.  States he is doing well.

## 2019-06-02 ENCOUNTER — Encounter: Payer: Self-pay | Admitting: Student in an Organized Health Care Education/Training Program

## 2019-06-03 ENCOUNTER — Other Ambulatory Visit: Payer: Self-pay

## 2019-06-03 ENCOUNTER — Encounter: Payer: Self-pay | Admitting: Student in an Organized Health Care Education/Training Program

## 2019-06-03 ENCOUNTER — Ambulatory Visit
Payer: Medicare Other | Attending: Student in an Organized Health Care Education/Training Program | Admitting: Student in an Organized Health Care Education/Training Program

## 2019-06-03 DIAGNOSIS — M25552 Pain in left hip: Secondary | ICD-10-CM | POA: Diagnosis present

## 2019-06-03 DIAGNOSIS — M533 Sacrococcygeal disorders, not elsewhere classified: Secondary | ICD-10-CM | POA: Diagnosis present

## 2019-06-03 DIAGNOSIS — G8929 Other chronic pain: Secondary | ICD-10-CM | POA: Diagnosis present

## 2019-06-03 DIAGNOSIS — M5136 Other intervertebral disc degeneration, lumbar region: Secondary | ICD-10-CM | POA: Diagnosis present

## 2019-06-03 DIAGNOSIS — G894 Chronic pain syndrome: Secondary | ICD-10-CM | POA: Diagnosis present

## 2019-06-03 DIAGNOSIS — M5416 Radiculopathy, lumbar region: Secondary | ICD-10-CM | POA: Insufficient documentation

## 2019-06-03 NOTE — Progress Notes (Signed)
Pain Management Virtual Encounter Note - Virtual Visit via Telephone Telehealth (real-time audio visits between healthcare provider and patient).   Patient's Phone No. & Preferred Pharmacy:  [phone] (home); [phone] (mobile); (Preferred) [phone] [email or web address]  [place], [place] - [address] [place] [address] Phone: [phone] Fax: [phone]    Pre-screening note:  Our staff contacted Ricardo Winters and offered him an "in person", "face-to-face" appointment versus a telephone encounter. He indicated preferring the telephone encounter, at this time.   Reason for Virtual Visit: COVID-19*  Social distancing based on CDC and AMA recommendations.   I contacted Ricardo Winters. on 06/03/2019 via telephone.      I clearly identified myself as Gillis Santa, MD. I verified that I was speaking with the correct person using two identifiers (Name: Ricardo Burgo., and date of birth: May 25, 1946).  Advanced Informed Consent I sought verbal advanced consent from Ricardo Winters. for virtual visit interactions. I informed Ricardo Winters of possible security and privacy concerns, risks, and limitations associated with providing "not-in-person" medical evaluation and management services. I also informed Ricardo Winters of the availability of "in-person" appointments. Finally, I informed him that there would be a charge for the virtual visit and that he could be  personally, fully or partially, financially responsible for it. Ricardo Winters expressed understanding and agreed to proceed.   Historic Elements   Ricardo Winters. is a 73 y.o. year old, male patient evaluated today after his last encounter by our practice on 05/07/2019. Ricardo Winters  has a past medical history of Anginal pain ([place]), Cancer [facility]), Coronary artery disease, Edema, Hypertension, Myocardial infarction [facility]), and Obesity. He also  has a past surgical history that  includes Shoulder acromioplasty and Colonoscopy with propofol (N/A, 12/12/2015). Ricardo Winters has a current medication list which includes the following prescription(s): amlodipine, amoxicillin, aspirin, coq10, collagen, nitroglycerin, pravastatin, sildenafil, turmeric, and vitamin b-12. He  reports that he has never smoked. He has never used smokeless tobacco. He reports current alcohol use of about 4.0 standard drinks of alcohol per week. He reports that he does not use drugs. Ricardo Winters has No Known Allergies.   HPI  Today, he is being contacted for a post-procedure assessment.  Evaluation of last interventional procedure  05/06/2019 Procedure: LEFT L5/S1 Pre-procedure pain score:  2/10 Post-procedure pain score: 0/10         Influential Factors: Intra-procedural challenges: None observed.         Reported side-effects: None.        Post-procedural adverse reactions or complications: None reported         Sedation: Please see nurses note for DOS. When no sedatives are used, the analgesic levels obtained are directly associated to the effectiveness of the local anesthetics. However, when sedation is provided, the level of analgesia obtained during the initial 1 hour following the intervention, is believed to be the result of a combination of factors. These factors may include, but are not limited to: 1. The effectiveness of the local anesthetics used. 2. The effects of the analgesic(s) and/or anxiolytic(s) used. 3. The degree of discomfort experienced by the patient at the time of the procedure. 4. The patients ability and reliability in recalling and recording the events. 5. The presence and influence of possible secondary gains and/or psychosocial factors. Reported result: Relief experienced during the 1st hour after the procedure: 100 % (Ultra-Short Term Relief)  Interpretative annotation: Clinically appropriate result. Analgesia during this period is likely to be Local Anesthetic  and/or IV Sedative (Analgesic/Anxiolytic) related.          Effects of local anesthetic: The analgesic effects attained during this period are directly associated to the localized infiltration of local anesthetics and therefore cary significant diagnostic value as to the etiological location, or anatomical origin, of the pain. Expected duration of relief is directly dependent on the pharmacodynamics of the local anesthetic used. Long-acting (4-6 hours) anesthetics used.  Reported result: Relief during the next 4 to 6 hour after the procedure: 100 % (Short-Term Relief)            Interpretative annotation: Clinically appropriate result. Analgesia during this period is likely to be Local Anesthetic-related.          Long-term benefit: Defined as the period of time past the expected duration of local anesthetics (1 hour for short-acting and 4-6 hours for long-acting). With the possible exception of prolonged sympathetic blockade from the local anesthetics, benefits during this period are typically attributed to, or associated with, other factors such as analgesic sensory neuropraxia, antiinflammatory effects, or beneficial biochemical changes provided by agents other than the local anesthetics.  Reported result: Extended relief following procedure: 100 %(for one week) (Long-Term Relief)            Interpretative annotation: Clinically appropriate result. Good relief. No permanent benefit expected. Inflammation plays a part in the etiology to the pain.          Laboratory Chemistry Profile (12 mo)  Renal: No results found for requested labs within last 8760 hours.  Lab Results  Component Value Date   GFRAA >60 03/10/2018   GFRNONAA >60 03/10/2018   Hepatic: No results found for requested labs within last 8760 hours. Lab Results  Component Value Date   AST 18 03/10/2018   ALT 16 (L) 03/10/2018   Other: No results found for requested labs within last 8760 hours. Note: Above Lab results  reviewed.  Imaging  Last 90 days:  Ricardo Winters  Result Date: 04/06/2019 CLINICAL DATA:  Left hip pain. Status post hip replacement 4 months ago. No low back pain with persistent left hip pain. EXAM: MRI LUMBAR Winters WITHOUT Winters TECHNIQUE: Multiplanar, multisequence Ricardo imaging of the lumbar Winters was performed. No intravenous Winters was administered. COMPARISON:  None. FINDINGS: Segmentation: 5 non rib-bearing lumbar type vertebral bodies are present. The lowest fully formed vertebral body is L5. Alignment: AP alignment is anatomic. No significant curvature is present. Vertebrae: Hemangiomas are present at L2 and L3. Marrow signal and vertebral body heights are normal. Conus medullaris and cauda equina: Conus extends to the L1 level. Conus and cauda equina appear normal. Paraspinal and other soft tissues: Limited imaging the abdomen is unremarkable. There is no significant adenopathy. No solid organ lesions are present. Disc levels: Relatively short pedicles are present throughout the lumbar Winters. L1-2: Negative. L2-3: Mild facet hypertrophy is present. No focal disc protrusion or stenosis is present. L3-4: Mild disc bulging is present. Moderate facet hypertrophy and ligamentum flavum thickening is noted. This leads to mild foraminal narrowing bilaterally. L4-5: A broad-based disc protrusion is present. Moderate facet hypertrophy is noted bilaterally. This leads to moderate central canal stenosis with right greater than left subarticular narrowing. A central annular tear is noted. A synovial cyst extends posteriorly from the right facet joint. Mild foraminal narrowing is evident bilaterally. L5-S1: A left paramedian disc protrusion is present. Moderate  facet hypertrophy is noted. This leads to moderate left subarticular narrowing with impact on the left S1 nerve roots. There is mild right subarticular narrowing. Moderate foraminal narrowing is present bilaterally. IMPRESSION: 1. The most  significant left-sided disease is at L5-S1 where a left paramedian disc protrusion results in moderate left subarticular narrowing, likely impacting the traversing left S1 nerve roots. 2. Moderate bilateral foraminal narrowing at L5-S1. 3. Right greater than left subarticular narrowing at L4-5 with mild foraminal narrowing bilaterally. 4. Mild bilateral foraminal narrowing at L3-4. 5. Multilevel facet disease as described. Electronically Signed   By: San Morelle M.D.   On: 04/06/2019 08:27   [facility]g [facility] C-arm 1-60 Min No Report  Result Date: 05/06/2019 Fluoro was used, but no Radiologist interpretation will be provided. Please refer to "NOTES" tab for provider progress note.  Last Hospital Admission:  Dg Pain Clinic C-arm 1-60 Min No Report  Result Date: 05/06/2019 Fluoro was used, but no Radiologist interpretation will be provided. Please refer to "NOTES" tab for provider progress note.  Assessment  The primary encounter diagnosis was Lumbar radiculopathy. Diagnoses of Chronic left SI joint pain, Chronic pain syndrome, Chronic left hip pain, Lumbar degenerative disc disease, and Sacroiliac joint dysfunction of left side were also pertinent to this visit.  Plan of Care  I am having Ricardo Winters. maintain his nitroGLYCERIN, sildenafil, aspirin, TURMERIC PO, vitamin B-12, pravastatin, CoQ10, COLLAGEN PO, amLODipine, and amoxicillin.  1. Lumbar radiculopathy  - Lumbar Epidural Injection; Future, Repeat #2 at LEFT L5/S1, will increase volume  2. Chronic left SI joint pain  - SACROILIAC JOINT INJECTION; Future  6. Sacroiliac joint dysfunction of left side  - SACROILIAC JOINT INJECTION; Future   Orders:  Orders Placed This Encounter  Procedures  . Lumbar Epidural Injection    Standing Status:   Future    Standing Expiration Date:   07/04/2019    Scheduling Instructions:     Procedure: Interlaminar Lumbar Epidural Steroid injection (LESI)            Laterality: Midline  LEFT L5/S1 #2     Sedation: without     Timeframe: ASAA    Order Specific Question:   Where will this procedure be performed?    Answer:   [facility]  . SACROILIAC JOINT INJECTION    Standing Status:   Future    Standing Expiration Date:   07/04/2019    Scheduling Instructions:     Side: LEFT     Sedation: WITHOUT     Timeframe: ASAP    Order Specific Question:   Where will this procedure be performed?    Answer:   [facility]   Follow-up plan:   Return in about 1 week (around 06/10/2019) for Left L5-S1 ESI#2 & left SI joint injection #1 (same day, 30 mins) without sedation.     Status post left L5-S1 ESI #1 on 05/06/2019, hopeful for 1 week, will repeat and also do left SI joint injection.   Recent Visits Date Type Provider Dept  05/06/19 Procedure visit Gillis Santa, MD [facility]  04/28/19 Office Visit Gillis Santa, MD [facility]  Showing recent visits within past 90 days and meeting all other requirements   Today's Visits Date Type Provider Dept  06/03/19 Office Visit Gillis Santa, MD [facility]  Showing today's visits and meeting all other requirements   Future Appointments No visits were found meeting these conditions.  Showing future appointments within next  90 days and meeting all other requirements   I discussed the assessment and treatment plan with the patient. The patient was provided an opportunity to ask questions and all were answered. The patient agreed with the plan and demonstrated an understanding of the instructions.  Patient advised to call back or seek an in-person evaluation if the symptoms or condition worsens.  Total duration of non-face-to-face encounter: 25 minutes.  Note by: Gillis Santa, MD Date: 06/03/2019; Time: 2:05 PM  Note: This dictation was prepared with Dragon dictation. Any transcriptional errors that may result from this process are unintentional.  Disclaimer:  * Given the special  circumstances of the COVID-19 pandemic, the federal government has announced that the Office for Civil Rights (OCR) will exercise its enforcement discretion and will not impose penalties on physicians using telehealth in the event of noncompliance with regulatory requirements under the [place] (HIPAA) in connection with the good faith provision of telehealth during the QMKJI-31 national public health emergency. ([place])

## 2019-06-10 ENCOUNTER — Ambulatory Visit: Payer: Medicare Other | Admitting: Student in an Organized Health Care Education/Training Program

## 2019-06-10 ENCOUNTER — Encounter: Payer: Self-pay | Admitting: Student in an Organized Health Care Education/Training Program

## 2019-06-10 ENCOUNTER — Other Ambulatory Visit: Payer: Self-pay

## 2019-06-10 VITALS — BP 161/84 | HR 64 | Temp 97.5°F | Resp 18 | Ht 74.0 in | Wt 234.0 lb

## 2019-06-10 NOTE — Progress Notes (Signed)
Safety precautions to be maintained throughout the outpatient stay will include: orient to surroundings, keep bed in low position, maintain call bell within reach at all times, provide assistance with transfer out of bed and ambulation.  

## 2019-06-17 ENCOUNTER — Encounter: Payer: Self-pay | Admitting: Student in an Organized Health Care Education/Training Program

## 2019-06-17 ENCOUNTER — Ambulatory Visit
Admission: RE | Admit: 2019-06-17 | Discharge: 2019-06-17 | Disposition: A | Discharge status: Home / Self Care | Payer: Medicare Other | Source: Ambulatory Visit | Attending: Student in an Organized Health Care Education/Training Program | Admitting: Student in an Organized Health Care Education/Training Program

## 2019-06-17 ENCOUNTER — Ambulatory Visit (HOSPITAL_BASED_OUTPATIENT_CLINIC_OR_DEPARTMENT_OTHER): Payer: Medicare Other | Admitting: Student in an Organized Health Care Education/Training Program

## 2019-06-17 ENCOUNTER — Other Ambulatory Visit: Payer: Self-pay

## 2019-06-17 VITALS — BP 159/74 | HR 57 | Temp 97.7°F | Resp 20 | Ht 73.0 in | Wt 234.0 lb

## 2019-06-17 DIAGNOSIS — M533 Sacrococcygeal disorders, not elsewhere classified: Secondary | ICD-10-CM | POA: Insufficient documentation

## 2019-06-17 DIAGNOSIS — G8929 Other chronic pain: Secondary | ICD-10-CM | POA: Diagnosis not present

## 2019-06-17 DIAGNOSIS — M5416 Radiculopathy, lumbar region: Secondary | ICD-10-CM | POA: Diagnosis not present

## 2019-06-17 MED ORDER — LIDOCAINE HCL 2 % IJ SOLN
20.0000 mL | Freq: Once | INTRAMUSCULAR | Status: AC
  Administered 2019-06-17: 12:00:00 400 mg

## 2019-06-17 MED ORDER — DEXAMETHASONE SODIUM PHOSPHATE 10 MG/ML IJ SOLN
10.0000 mg | Freq: Once | INTRAMUSCULAR | Status: AC
Start: 2019-06-17 — End: 2019-06-17
  Administered 2019-06-17: 12:00:00 10 mg

## 2019-06-17 MED ORDER — ROPIVACAINE HCL 2 MG/ML IJ SOLN
1.0000 mL | Freq: Once | INTRAMUSCULAR | Status: AC
  Administered 2019-06-17: 12:00:00 1 mL via EPIDURAL

## 2019-06-17 MED ORDER — DEXAMETHASONE SODIUM PHOSPHATE 10 MG/ML IJ SOLN
INTRAMUSCULAR | Status: AC
  Filled 2019-06-17: qty 1

## 2019-06-17 MED ORDER — DEXAMETHASONE SODIUM PHOSPHATE 10 MG/ML IJ SOLN
10.0000 mg | Freq: Once | INTRAMUSCULAR | Status: AC
  Administered 2019-06-17: 12:00:00 10 mg

## 2019-06-17 MED ORDER — ROPIVACAINE HCL 2 MG/ML IJ SOLN
INTRAMUSCULAR | Status: AC
  Filled 2019-06-17: qty 10

## 2019-06-17 MED ORDER — LIDOCAINE HCL 2 % IJ SOLN
INTRAMUSCULAR | Status: AC
  Filled 2019-06-17: qty 20

## 2019-06-17 MED ORDER — SODIUM CHLORIDE (PF) 0.9 % IJ SOLN
INTRAMUSCULAR | Status: AC
  Filled 2019-06-17: qty 10

## 2019-06-17 MED ORDER — ROPIVACAINE HCL 2 MG/ML IJ SOLN
2.0000 mL | Freq: Once | INTRAMUSCULAR | Status: AC
  Administered 2019-06-17: 2 mL via EPIDURAL

## 2019-06-17 MED ORDER — IOHEXOL 180 MG/ML  SOLN
10.0000 mL | Freq: Once | INTRAMUSCULAR | Status: AC
  Administered 2019-06-17: 12:00:00 10 mL via EPIDURAL

## 2019-06-17 NOTE — Progress Notes (Signed)
Patient's Name: Ricardo Winters.  MRN: [id number]  Referring Provider: Rusty Aus, MD  DOB: 1945/11/30  PCP: Rusty Aus, MD  DOS: 06/17/2019  Note by: Gillis Santa, MD  Service setting: Ambulatory outpatient  Specialty: Interventional Pain Management  Patient type: Established  Location: [facility] (AMB) Pain Management Facility  Visit type: Interventional Procedure   Primary Reason for Visit: Interventional Pain Management Treatment. CC: Tailbone Pain and Back Pain (lower)  Procedure:          Anesthesia, Analgesia, Anxiolysis:  Type: Diagnostic Sacroiliac Joint Steroid Injection #1  Region: Inferior Lumbosacral Region Level: PSIS (Posterior Superior Iliac Spine) Laterality: Left-Side  Type: Local Anesthesia  Local Anesthetic: Lidocaine 1-2%  Position: Prone           Indications: LEFT SI-J Pain/Arthralgia  Pain Score: Pre-procedure: 4 /10 Post-procedure: 4 /10   Pre-op Assessment:  Ricardo Winters is a 73 y.o. (year old), male patient, seen today for interventional treatment. He  has a past surgical history that includes Shoulder acromioplasty and Colonoscopy with propofol (N/A, 12/12/2015). Ricardo Winters has a current medication list which includes the following prescription(s): amlodipine, nitroglycerin, pravastatin, sildenafil, vitamin b-12, zinc sulfate, amoxicillin, aspirin, coq10, collagen, and turmeric. His primarily concern today is the Tailbone Pain and Back Pain (lower)  Initial Vital Signs:  Pulse/HCG Rate: (!) 57ECG Heart Rate: 68 Temp: 97.7 F (36.5 C) Resp: 20 BP: (!) 165/88 SpO2: 100 %  BMI: Estimated body mass index is 30.87 kg/m as calculated from the following:   Height as of this encounter: 6\' 1"  (1.854 m).   Weight as of this encounter: 234 lb (106.1 kg).  Risk Assessment: Allergies: Reviewed. He has No Known Allergies.  Allergy Precautions: None required Coagulopathies: Reviewed. None identified.  Blood-thinner therapy: None at this time Active  Infection(s): Reviewed. None identified. Ricardo Winters is afebrile  Site Confirmation: Ricardo Winters was asked to confirm the procedure and laterality before marking the site Procedure checklist: Completed Consent: Before the procedure and under the influence of no sedative(s), amnesic(s), or anxiolytics, the patient was informed of the treatment options, risks and possible complications. To fulfill our ethical and legal obligations, as recommended by the American Medical Association's Code of Ethics, I have informed the patient of my clinical impression; the nature and purpose of the treatment or procedure; the risks, benefits, and possible complications of the intervention; the alternatives, including doing nothing; the risk(s) and benefit(s) of the alternative treatment(s) or procedure(s); and the risk(s) and benefit(s) of doing nothing. The patient was provided information about the general risks and possible complications associated with the procedure. These may include, but are not limited to: failure to achieve desired goals, infection, bleeding, organ or nerve damage, allergic reactions, paralysis, and death. In addition, the patient was informed of those risks and complications associated to the procedure, such as failure to decrease pain; infection; bleeding; organ or nerve damage with subsequent damage to sensory, motor, and/or autonomic systems, resulting in permanent pain, numbness, and/or weakness of one or several areas of the body; allergic reactions; (i.e.: anaphylactic reaction); and/or death. Furthermore, the patient was informed of those risks and complications associated with the medications. These include, but are not limited to: allergic reactions (i.e.: anaphylactic or anaphylactoid reaction(s)); adrenal axis suppression; blood sugar elevation that in diabetics may result in ketoacidosis or comma; water retention that in patients with history of congestive heart failure may result in  shortness of breath, pulmonary edema, and decompensation with resultant heart failure; weight  gain; swelling or edema; medication-induced neural toxicity; particulate matter embolism and blood vessel occlusion with resultant organ, and/or nervous system infarction; and/or aseptic necrosis of one or more joints. Finally, the patient was informed that Medicine is not an exact science; therefore, there is also the possibility of unforeseen or unpredictable risks and/or possible complications that may result in a catastrophic outcome. The patient indicated having understood very clearly. We have given the patient no guarantees and we have made no promises. Enough time was given to the patient to ask questions, all of which were answered to the patient's satisfaction. Ricardo Winters has indicated that he wanted to continue with the procedure. Attestation: I, the ordering provider, attest that I have discussed with the patient the benefits, risks, side-effects, alternatives, likelihood of achieving goals, and potential problems during recovery for the procedure that I have provided informed consent. Date  Time: 06/17/2019 10:54 AM  Pre-Procedure Preparation:  Monitoring: As per clinic protocol. Respiration, ETCO2, SpO2, BP, heart rate and rhythm monitor placed and checked for adequate function Safety Precautions: Patient was assessed for positional comfort and pressure points before starting the procedure. Time-out: I initiated and conducted the "Time-out" before starting the procedure, as per protocol. The patient was asked to participate by confirming the accuracy of the "Time Out" information. Verification of the correct person, site, and procedure were performed and confirmed by me, the nursing staff, and the patient. "Time-out" conducted as per Joint Commission's Universal Protocol (UP.01.01.01). Time: 1150  Description of Procedure:          Target Area: Inferior, posterior, aspect of the sacroiliac  fissure Approach: Posterior, paraspinal, ipsilateral approach. Area Prepped: Entire Lower Lumbosacral Region Prepping solution: DuraPrep (Iodine Povacrylex [0.7% available iodine] and Isopropyl Alcohol, 74% w/w) Safety Precautions: Aspiration looking for blood return was conducted prior to all injections. At no point did we inject any substances, as a needle was being advanced. No attempts were made at seeking any paresthesias. Safe injection practices and needle disposal techniques used. Medications properly checked for expiration dates. SDV (single dose vial) medications used. Description of the Procedure: Protocol guidelines were followed. The patient was placed in position over the procedure table. The target area was identified and the area prepped in the usual manner. Skin & deeper tissues infiltrated with local anesthetic. Appropriate amount of time allowed to pass for local anesthetics to take effect. The procedure needle was advanced under fluoroscopic guidance into the sacroiliac joint until a firm endpoint was obtained. Proper needle placement secured. Negative aspiration confirmed. Solution injected in intermittent fashion, asking for systemic symptoms every 0.5cc of injectate. The needles were then removed and the area cleansed, making sure to leave some of the prepping solution back to take advantage of its long term bactericidal properties. Vitals:   06/17/19 1155 06/17/19 1200 06/17/19 1205 06/17/19 1210  BP: (!) 166/97 (!) 160/85 (!) 174/68 (!) 159/74  Pulse:      Resp: 16 18 15 20   Temp:      TempSrc:      SpO2: 100% 100% 99% 99%  Weight:      Height:        Start Time: 1150 hrs. End Time: 1209 hrs. Materials:  Needle(s) Type: Spinal Needle Gauge: 22G Length: 3.5-in Medication(s): Please see orders for medications and dosing details. 5 cc solution made of 4 cc of 0.2% ropivacaine, 1 cc of Decadron 10 mg/cc.  2.5 cc injected intra-articular, 2.5 cc injected periarticular for  left SI joint. Imaging Guidance (Non-Spinal):  Type of Imaging Technique: Fluoroscopy Guidance (Non-Spinal) Indication(s): Assistance in needle guidance and placement for procedures requiring needle placement in or near specific anatomical locations not easily accessible without such assistance. Exposure Time: Please see nurses notes. Contrast: Before injecting any contrast, we confirmed that the patient did not have an allergy to iodine, shellfish, or radiological contrast. Once satisfactory needle placement was completed at the desired level, radiological contrast was injected. Contrast injected under live fluoroscopy. No contrast complications. See chart for type and volume of contrast used. Fluoroscopic Guidance: I was personally present during the use of fluoroscopy. "Tunnel Vision Technique" used to obtain the best possible view of the target area. Parallax error corrected before commencing the procedure. "Direction-depth-direction" technique used to introduce the needle under continuous pulsed fluoroscopy. Once target was reached, antero-posterior, oblique, and lateral fluoroscopic projection used confirm needle placement in all planes. Images permanently stored in EMR. Interpretation: I personally interpreted the imaging intraoperatively. Adequate needle placement confirmed in multiple planes. Appropriate spread of contrast into desired area was observed. No evidence of afferent or efferent intravascular uptake. Permanent images saved into the patient's record.  Antibiotic Prophylaxis:   Anti-infectives (From admission, onward)   None     Indication(s): None identified  Post-operative Assessment:  Post-procedure Vital Signs:  Pulse/HCG Rate: (!) 5769 Temp: 97.7 F (36.5 C) Resp: 20 BP: (!) 159/74 SpO2: 99 %  EBL: None  Complications: No immediate post-treatment complications observed by team, or reported by patient.  Note: The patient tolerated the entire procedure well.  A repeat set of vitals were taken after the procedure and the patient was kept under observation following institutional policy, for this type of procedure. Post-procedural neurological assessment was performed, showing return to baseline, prior to discharge. The patient was provided with post-procedure discharge instructions, including a section on how to identify potential problems. Should any problems arise concerning this procedure, the patient was given instructions to immediately contact us, at any time, without hesitation. In any case, we plan to contact the patient by telephone for a follow-up status report regarding this interventional procedure.  Comments:  No additional relevant information.  Plan of Care  Orders:  Orders Placed This Encounter  Procedures  . DG PA[facility] C-ARM 1-60 MIN NO REPORT    Intraoperative interpretation by procedural physician at [place].    Standing Status:   Standing    Number of Occurrences:   1    Order Specific Question:   Reason for exam:    Answer:   Assistance in needle guidance and placement for procedures requiring needle placement in or near specific anatomical locations not easily accessible without such assistance.    Medications ordered for procedure: Meds ordered this encounter  Medications  . iohexol (OMNIPAQUE) 180 MG/ML injection 10 mL    Must be Myelogram-compatible. If not available, you may substitute with a water-soluble, non-ionic, hypoallergenic, myelogram-compatible radiological contrast medium.  Marland Kitchen lidocaine (XYLOCAINE) 2 % (with pres) injection 400 mg  . dexamethasone (DECADRON) injection 10 mg  . ropivacaine (PF) 2 mg/mL (0.2%) (NAROPIN) injection 1 mL  . ropivacaine (PF) 2 mg/mL (0.2%) (NAROPIN) injection 2 mL  . dexamethasone (DECADRON) injection 10 mg   Medications administered: We administered iohexol, lidocaine, dexamethasone, ropivacaine (PF) 2 mg/mL (0.2%), ropivacaine (PF) 2 mg/mL (0.2%), and  dexamethasone.  See the medical record for exact dosing, route, and time of administration.  Follow-up plan:   Return in about 4 weeks (around 07/15/2019) for Post Procedure Evaluation, virtual.  Status post left L5-S1 ESI #1 on 05/06/2019, and #2 on 06/17/2019 as well as left sacroiliac joint injection #1 on 06/17/2019   Recent Visits Date Type Provider Dept  06/03/19 Office Visit Gillis Santa, MD [facility]  05/06/19 Procedure visit Gillis Santa, MD [facility]  04/28/19 Office Visit Gillis Santa, MD [facility]  Showing recent visits within past 90 days and meeting all other requirements   Today's Visits Date Type Provider Dept  06/17/19 Procedure visit Gillis Santa, MD [facility]  Showing today's visits and meeting all other requirements   Future Appointments Date Type Provider Dept  07/21/19 Appointment Gillis Santa, MD [facility]  Showing future appointments within next 90 days and meeting all other requirements   Disposition: Discharge home  Discharge Date & Time: 06/17/2019; 1225 hrs.   Primary Care Physician: Rusty Aus, MD Location: [facility] Note by: Gillis Santa, MD Date: 06/17/2019; Time: 4:28 PM  Disclaimer:  Medicine is not an exact science. The only guarantee in medicine is that nothing is guaranteed. It is important to note that the decision to proceed with this intervention was based on the information collected from the patient. The Data and conclusions were drawn from the patient's questionnaire, the interview, and the physical examination. Because the information was provided in large part by the patient, it cannot be guaranteed that it has not been purposely or unconsciously manipulated. Every effort has been made to obtain as much relevant data as possible for this evaluation. It is important to note that the conclusions that lead to this procedure are derived in large part  from the available data. Always take into account that the treatment will also be dependent on availability of resources and existing treatment guidelines, considered by other Pain Management Practitioners as being common knowledge and practice, at the time of the intervention. For Medico-Legal purposes, it is also important to point out that variation in procedural techniques and pharmacological choices are the acceptable norm. The indications, contraindications, technique, and results of the above procedure should only be interpreted and judged by a Board-Certified Interventional Pain Specialist with extensive familiarity and expertise in the same exact procedure and technique.

## 2019-06-17 NOTE — Progress Notes (Signed)
Patient's Name: Ricardo Winters.  MRN: [id number]  Referring Provider: Rusty Aus, MD  DOB: [birth year]  PCP: Rusty Aus, MD  DOS: 06/17/2019  Note by: Gillis Santa, MD  Service setting: Ambulatory outpatient  Specialty: Interventional Pain Management  Patient type: Established  Location: [facility] (AMB) Pain Management Facility  Visit type: Interventional Procedure   Primary Reason for Visit: Interventional Pain Management Treatment. CC: Tailbone Pain and Back Pain (lower)  Procedure:          Anesthesia, Analgesia, Anxiolysis:  Type: Therapeutic Inter-Laminar Epidural Steroid Injection  #2  Region: Lumbar Level: L5-S1 Level. Laterality: Left-Sided         Type: Local Anesthesia  Local Anesthetic: Lidocaine 1-2%  Position: Prone with head of the table was raised to facilitate breathing.   Indications: 1. Lumbar radiculopathy   2. Chronic left SI joint pain    Pain Score: Pre-procedure: 4 /10 Post-procedure: 4 /10  Patient stopped his 325 mg aspirin 7 days prior to next physician to 81 mg aspirin.  Pre-op Assessment:  Ricardo Winters is a 73 y.o. (year old), male patient, seen today for interventional treatment. He  has a past surgical history that includes Shoulder acromioplasty and Colonoscopy with propofol (N/A, 12/12/2015). Ricardo Winters has a current medication list which includes the following prescription(s): amlodipine, nitroglycerin, pravastatin, sildenafil, vitamin b-12, zinc sulfate, amoxicillin, aspirin, coq10, collagen, and turmeric. His primarily concern today is the Tailbone Pain and Back Pain (lower)  Initial Vital Signs:  Pulse/HCG Rate: (!) 57ECG Heart Rate: 68 Temp: 97.7 F (36.5 C) Resp: 20 BP: (!) 165/88 SpO2: 100 %  BMI: Estimated body mass index is 30.87 kg/m as calculated from the following:   Height as of this encounter: 6\' 1"  (1.854 m).   Weight as of this encounter: 234 lb (106.1 kg).  Risk Assessment: Allergies: Reviewed. He has No Known  Allergies.  Allergy Precautions: None required Coagulopathies: Reviewed. None identified.  Blood-thinner therapy: None at this time Active Infection(s): Reviewed. None identified. Ricardo Winters is afebrile  Site Confirmation: Ricardo Winters was asked to confirm the procedure and laterality before marking the site Procedure checklist: Completed Consent: Before the procedure and under the influence of no sedative(s), amnesic(s), or anxiolytics, the patient was informed of the treatment options, risks and possible complications. To fulfill our ethical and legal obligations, as recommended by the American Medical Association's Code of Ethics, I have informed the patient of my clinical impression; the nature and purpose of the treatment or procedure; the risks, benefits, and possible complications of the intervention; the alternatives, including doing nothing; the risk(s) and benefit(s) of the alternative treatment(s) or procedure(s); and the risk(s) and benefit(s) of doing nothing. The patient was provided information about the general risks and possible complications associated with the procedure. These may include, but are not limited to: failure to achieve desired goals, infection, bleeding, organ or nerve damage, allergic reactions, paralysis, and death. In addition, the patient was informed of those risks and complications associated to Spine-related procedures, such as failure to decrease pain; infection (i.e.: Meningitis, epidural or intraspinal abscess); bleeding (i.e.: epidural hematoma, subarachnoid hemorrhage, or any other type of intraspinal or peri-dural bleeding); organ or nerve damage (i.e.: Any type of peripheral nerve, nerve root, or spinal cord injury) with subsequent damage to sensory, motor, and/or autonomic systems, resulting in permanent pain, numbness, and/or weakness of one or several areas of the body; allergic reactions; (i.e.: anaphylactic reaction); and/or death. Furthermore, the patient  was informed of  those risks and complications associated with the medications. These include, but are not limited to: allergic reactions (i.e.: anaphylactic or anaphylactoid reaction(s)); adrenal axis suppression; blood sugar elevation that in diabetics may result in ketoacidosis or comma; water retention that in patients with history of congestive heart failure may result in shortness of breath, pulmonary edema, and decompensation with resultant heart failure; weight gain; swelling or edema; medication-induced neural toxicity; particulate matter embolism and blood vessel occlusion with resultant organ, and/or nervous system infarction; and/or aseptic necrosis of one or more joints. Finally, the patient was informed that Medicine is not an exact science; therefore, there is also the possibility of unforeseen or unpredictable risks and/or possible complications that may result in a catastrophic outcome. The patient indicated having understood very clearly. We have given the patient no guarantees and we have made no promises. Enough time was given to the patient to ask questions, all of which were answered to the patient's satisfaction. Mr. Curran has indicated that he wanted to continue with the procedure. Attestation: I, the ordering provider, attest that I have discussed with the patient the benefits, risks, side-effects, alternatives, likelihood of achieving goals, and potential problems during recovery for the procedure that I have provided informed consent. Date  Time: 06/17/2019 10:54 AM  Pre-Procedure Preparation:  Monitoring: As per clinic protocol. Respiration, ETCO2, SpO2, BP, heart rate and rhythm monitor placed and checked for adequate function Safety Precautions: Patient was assessed for positional comfort and pressure points before starting the procedure. Time-out: I initiated and conducted the "Time-out" before starting the procedure, as per protocol. The patient was asked to participate by  confirming the accuracy of the "Time Out" information. Verification of the correct person, site, and procedure were performed and confirmed by me, the nursing staff, and the patient. "Time-out" conducted as per Joint Commission's Universal Protocol (UP.01.01.01). Time: 1150  Description of Procedure:          Target Area: The interlaminar space, initially targeting the lower laminar border of the superior vertebral body. Approach: Paramedial approach. Area Prepped: Entire Posterior Lumbar Region Prepping solution: DuraPrep (Iodine Povacrylex [0.7% available iodine] and Isopropyl Alcohol, 74% w/w) Safety Precautions: Aspiration looking for blood return was conducted prior to all injections. At no point did we inject any substances, as a needle was being advanced. No attempts were made at seeking any paresthesias. Safe injection practices and needle disposal techniques used. Medications properly checked for expiration dates. SDV (single dose vial) medications used. Description of the Procedure: Protocol guidelines were followed. The procedure needle was introduced through the skin, ipsilateral to the reported pain, and advanced to the target area. Bone was contacted and the needle walked caudad, until the lamina was cleared. The epidural space was identified using "loss-of-resistance technique" with 2-3 ml of PF-NaCl (0.9% NSS), in a 5cc LOR glass syringe.  Vitals:   06/17/19 1155 06/17/19 1200 06/17/19 1205 06/17/19 1210  BP: (!) 166/97 (!) 160/85 (!) 174/68 (!) 159/74  Pulse:      Resp: 16 18 15 20   Temp:      TempSrc:      SpO2: 100% 100% 99% 99%  Weight:      Height:        Start Time: 1150 hrs. End Time: 1209 hrs.  Materials:  Needle(s) Type: Epidural needle Gauge: 17G Length: 3.5-in Medication(s): Please see orders for medications and dosing details. 8 cc solution made of 5 cc of preservative-free saline, 2 cc of 0.2% ropivacaine, 1 cc of Decadron 10  mg/cc.  Imaging Guidance  (Spinal):          Type of Imaging Technique: Fluoroscopy Guidance (Spinal) Indication(s): Assistance in needle guidance and placement for procedures requiring needle placement in or near specific anatomical locations not easily accessible without such assistance. Exposure Time: Please see nurses notes. Contrast: Before injecting any contrast, we confirmed that the patient did not have an allergy to iodine, shellfish, or radiological contrast. Once satisfactory needle placement was completed at the desired level, radiological contrast was injected. Contrast injected under live fluoroscopy. No contrast complications. See chart for type and volume of contrast used. Fluoroscopic Guidance: I was personally present during the use of fluoroscopy. "Tunnel Vision Technique" used to obtain the best possible view of the target area. Parallax error corrected before commencing the procedure. "Direction-depth-direction" technique used to introduce the needle under continuous pulsed fluoroscopy. Once target was reached, antero-posterior, oblique, and lateral fluoroscopic projection used confirm needle placement in all planes. Images permanently stored in EMR. Interpretation: I personally interpreted the imaging intraoperatively. Adequate needle placement confirmed in multiple planes. Appropriate spread of contrast into desired area was observed. No evidence of afferent or efferent intravascular uptake. No intrathecal or subarachnoid spread observed. Permanent images saved into the patient's record.  Antibiotic Prophylaxis:   Anti-infectives (From admission, onward)   None     Indication(s): None identified  Post-operative Assessment:  Post-procedure Vital Signs:  Pulse/HCG Rate: (!) 5769 Temp: 97.7 F (36.5 C) Resp: 20 BP: (!) 159/74 SpO2: 99 %  EBL: None  Complications: No immediate post-treatment complications observed by team, or reported by patient.  Note: The patient tolerated the entire  procedure well. A repeat set of vitals were taken after the procedure and the patient was kept under observation following institutional policy, for this type of procedure. Post-procedural neurological assessment was performed, showing return to baseline, prior to discharge. The patient was provided with post-procedure discharge instructions, including a section on how to identify potential problems. Should any problems arise concerning this procedure, the patient was given instructions to immediately contact us, at any time, without hesitation. In any case, we plan to contact the patient by telephone for a follow-up status report regarding this interventional procedure.  Comments:  No additional relevant information. 5 out of 5 strength bilateral lower extremity: Plantar flexion, dorsiflexion, knee flexion, knee extension.  Plan of Care  Orders:  Orders Placed This Encounter  Procedures  . DG PAIN CLIN[facility] C-ARM 1-60 MIN NO REPORT    Intraoperative interpretation by procedural physician at [facility].    Standing Status:   Standing    Number of Occurrences:   1    Order Specific Question:   Reason for exam:    Answer:   Assistance in needle guidance and placement for procedures requiring needle placement in or near specific anatomical locations not easily accessible without such assistance.    Medications ordered for procedure: Meds ordered this encounter  Medications  . iohexol (OMNIPAQUE) 180 MG/ML injection 10 mL    Must be Myelogram-compatible. If not available, you may substitute with a water-soluble, non-ionic, hypoallergenic, myelogram-compatible radiological contrast medium.  Marland Kitchen lidocaine (XYLOCAINE) 2 % (with pres) injection 400 mg  . dexamethasone (DECADRON) injection 10 mg  . ropivacaine (PF) 2 mg/mL (0.2%) (NAROPIN) injection 1 mL  . ropivacaine (PF) 2 mg/mL (0.2%) (NAROPIN) injection 2 mL  . dexamethasone (DECADRON) injection 10 mg   Medications administered: We  administered iohexol, lidocaine, dexamethasone, ropivacaine (PF) 2 mg/mL (0.2%), ropivacaine (PF) 2  mg/mL (0.2%), and dexamethasone.  See the medical record for exact dosing, route, and time of administration.  Follow-up plan:   Return in about 4 weeks (around 07/15/2019) for Post Procedure Evaluation, virtual.     Recent Visits Date Type Provider Dept  06/03/19 Office Visit Gillis Santa, MD [facility]  05/06/19 Procedure visit Gillis Santa, MD [facility]  04/28/19 Office Visit Gillis Santa, MD [facility]  Showing recent visits within past 90 days and meeting all other requirements   Today's Visits Date Type Provider Dept  06/17/19 Procedure visit Gillis Santa, MD [facility]  Showing today's visits and meeting all other requirements   Future Appointments Date Type Provider Dept  07/21/19 Appointment Gillis Santa, MD [facility]  Showing future appointments within next 90 days and meeting all other requirements   Disposition: Discharge home  Discharge Date & Time: 06/17/2019; 1225 hrs.   Primary Care Physician: Rusty Aus, MD Location: [facility] Note by: Gillis Santa, MD Date: 06/17/2019; Time: 4:24 PM  Disclaimer:  Medicine is not an exact science. The only guarantee in medicine is that nothing is guaranteed. It is important to note that the decision to proceed with this intervention was based on the information collected from the patient. The Data and conclusions were drawn from the patient's questionnaire, the interview, and the physical examination. Because the information was provided in large part by the patient, it cannot be guaranteed that it has not been purposely or unconsciously manipulated. Every effort has been made to obtain as much relevant data as possible for this evaluation. It is important to note that the conclusions that lead to this procedure are derived in large part from  the available data. Always take into account that the treatment will also be dependent on availability of resources and existing treatment guidelines, considered by other Pain Management Practitioners as being common knowledge and practice, at the time of the intervention. For Medico-Legal purposes, it is also important to point out that variation in procedural techniques and pharmacological choices are the acceptable norm. The indications, contraindications, technique, and results of the above procedure should only be interpreted and judged by a Board-Certified Interventional Pain Specialist with extensive familiarity and expertise in the same exact procedure and technique.

## 2019-06-17 NOTE — Progress Notes (Signed)
Safety precautions to be maintained throughout the outpatient stay will include: orient to surroundings, keep bed in low position, maintain call bell within reach at all times, provide assistance with transfer out of bed and ambulation.  

## 2019-06-17 NOTE — Patient Instructions (Signed)
Pain Management Discharge Instructions  General Discharge Instructions :  If you need to reach your doctor call: Monday-Friday 8:00 am - 4:00 pm at [phone] or toll free [phone].  After clinic hours [phone] to have operator reach doctor.  Bring all of your medication bottles to all your appointments in the pain clinic.  To cancel or reschedule your appointment with Pain Management please remember to call 24 hours in advance to avoid a fee.  Refer to the educational materials which you have been given on: General Risks, I had my Procedure. Discharge Instructions, Post Sedation.  Post Procedure Instructions:  The drugs you were given will stay in your system until tomorrow, so for the next 24 hours you should not drive, make any legal decisions or drink any alcoholic beverages.  You may eat anything you prefer, but it is better to start with liquids then soups and crackers, and gradually work up to solid foods.  Please notify your doctor immediately if you have any unusual bleeding, trouble breathing or pain that is not related to your normal pain.  Depending on the type of procedure that was done, some parts of your body may feel week and/or numb.  This usually clears up by tonight or the next day.  Walk with the use of an assistive device or accompanied by an adult for the 24 hours.  You may use ice on the affected area for the first 24 hours.  Put ice in a Ziploc bag and cover with a towel and place against area 15 minutes on 15 minutes off.  You may switch to heat after 24 hours.Epidural Steroid Injection Patient Information  Description: The epidural space surrounds the nerves as they exit the spinal cord.  In some patients, the nerves can be compressed and inflamed by a bulging disc or a tight spinal canal (spinal stenosis).  By injecting steroids into the epidural space, we can bring irritated nerves into direct contact with a potentially helpful medication.  These  steroids act directly on the irritated nerves and can reduce swelling and inflammation which often leads to decreased pain.  Epidural steroids may be injected anywhere along the spine and from the neck to the low back depending upon the location of your pain.   After numbing the skin with local anesthetic (like Novocaine), a small needle is passed into the epidural space slowly.  You may experience a sensation of pressure while this is being done.  The entire block usually last less than 10 minutes.  Conditions which may be treated by epidural steroids:   Low back and leg pain  Neck and arm pain  Spinal stenosis  Post-laminectomy syndrome  Herpes zoster (shingles) pain  Pain from compression fractures  Preparation for the injection:  1. Do not eat any solid food or dairy products within 8 hours of your appointment.  2. You may drink clear liquids up to 3 hours before appointment.  Clear liquids include water, black coffee, juice or soda.  No milk or cream please. 3. You may take your regular medication, including pain medications, with a sip of water before your appointment  Diabetics should hold regular insulin (if taken separately) and take 1/2 normal NPH dos the morning of the procedure.  Carry some sugar containing items with you to your appointment. 4. A driver must accompany you and be prepared to drive you home after your procedure.  5. Bring all your current medications with your. 6. An IV may be inserted and   sedation may be given at the discretion of the physician.   7. A blood pressure cuff, EKG and other monitors will often be applied during the procedure.  Some patients may need to have extra oxygen administered for a short period. 8. You will be asked to provide medical information, including your allergies, prior to the procedure.  We must know immediately if you are taking blood thinners (like Coumadin/Warfarin)  Or if you are allergic to IV iodine contrast (dye). We must  know if you could possible be pregnant.  Possible side-effects:  Bleeding from needle site  Infection (rare, may require surgery)  Nerve injury (rare)  Numbness & tingling (temporary)  Difficulty urinating (rare, temporary)  Spinal headache ( a headache worse with upright posture)  Light -headedness (temporary)  Pain at injection site (several days)  Decreased blood pressure (temporary)  Weakness in arm/leg (temporary)  Pressure sensation in back/neck (temporary)  Call if you experience:  Fever/chills associated with headache or increased back/neck pain.  Headache worsened by an upright position.  New onset weakness or numbness of an extremity below the injection site  Hives or difficulty breathing (go to the emergency room)  Inflammation or drainage at the infection site  Severe back/neck pain  Any new symptoms which are concerning to you  Please note:  Although the local anesthetic injected can often make your back or neck feel good for several hours after the injection, the pain will likely return.  It takes 3-7 days for steroids to work in the epidural space.  You may not notice any pain relief for at least that one week.  If effective, we will often do a series of three injections spaced 3-6 weeks apart to maximally decrease your pain.  After the initial series, we generally will wait several months before considering a repeat injection of the same type.  If you have any questions, please call [phone] [facility] 

## 2019-06-18 ENCOUNTER — Telehealth: Payer: Self-pay | Admitting: *Deleted

## 2019-06-18 NOTE — Telephone Encounter (Signed)
No post procedure issues. Doing well.

## 2019-07-20 ENCOUNTER — Encounter: Payer: Self-pay | Admitting: Student in an Organized Health Care Education/Training Program

## 2019-07-21 ENCOUNTER — Encounter: Payer: Self-pay | Admitting: Student in an Organized Health Care Education/Training Program

## 2019-07-21 ENCOUNTER — Ambulatory Visit
Payer: Medicare Other | Attending: Student in an Organized Health Care Education/Training Program | Admitting: Student in an Organized Health Care Education/Training Program

## 2019-07-21 ENCOUNTER — Other Ambulatory Visit: Payer: Self-pay

## 2019-07-21 DIAGNOSIS — M1712 Unilateral primary osteoarthritis, left knee: Secondary | ICD-10-CM

## 2019-07-21 DIAGNOSIS — M5416 Radiculopathy, lumbar region: Secondary | ICD-10-CM

## 2019-07-21 DIAGNOSIS — M533 Sacrococcygeal disorders, not elsewhere classified: Secondary | ICD-10-CM

## 2019-07-21 DIAGNOSIS — M25552 Pain in left hip: Secondary | ICD-10-CM

## 2019-07-21 DIAGNOSIS — M5136 Other intervertebral disc degeneration, lumbar region: Secondary | ICD-10-CM

## 2019-07-21 DIAGNOSIS — G894 Chronic pain syndrome: Secondary | ICD-10-CM

## 2019-07-21 DIAGNOSIS — G8929 Other chronic pain: Secondary | ICD-10-CM

## 2019-07-21 NOTE — Progress Notes (Signed)
Pain Management Virtual Encounter Note - Virtual Visit via [place] (real-time audio visits between healthcare provider and patient).   Patient's Phone No. & Preferred Pharmacy:  [phone] (home); [phone] (mobile); (Preferred) [phone] [email or web address]  [place], [place] - [address] [place] [address] Phone: [phone] Fax: [phone]  [facility] [id number], [place] - [address] [place] [address] Phone: [phone] Fax: [phone]    Pre-screening note:  Our staff contacted Mr. Schneider and offered him an "in person", "face-to-face" appointment versus a telephone encounter. He indicated preferring the telephone encounter, at this time.   Reason for Virtual Visit: COVID-19*  Social distancing based on CDC and AMA recommendations.   I contacted [name]. on 07/21/2019 via video conference.      I clearly identified myself as Gillis Santa, MD. I verified that I was speaking with the correct person using two identifiers (Name: Margie Wold., and date of birth: [birth year]).  Advanced Informed Consent I sought verbal advanced consent from [name]. for virtual visit interactions. I informed Mr. Lumbard of possible security and privacy concerns, risks, and limitations associated with providing "not-in-person" medical evaluation and management services. I also informed Mr. Dizon of the availability of "in-person" appointments. Finally, I informed him that there would be a charge for the virtual visit and that he could be  personally, fully or partially, financially responsible for it. Mr. Douglas expressed understanding and agreed to proceed.   Historic Elements   Mr. Damon Gersh. is a 73 y.o. year old, male patient evaluated today after his last encounter by our practice on 06/18/2019. Mr. Kedrowski  has a  past medical history of Anginal pain ([place]), Cancer [facility]), Coronary artery disease, Edema, Hypertension, Myocardial infarction [facility]), and Obesity. He also  has a past surgical history that includes Shoulder acromioplasty and Colonoscopy with propofol (N/A, 12/12/2015). Mr. Patnode has a current medication list which includes the following prescription(s): amoxicillin, aspirin, coq10, collagen, nitroglycerin, pravastatin, sildenafil, vitamin b-12, zinc sulfate, and amlodipine. He  reports that he has never smoked. He has never used smokeless tobacco. He reports current alcohol use of about 4.0 standard drinks of alcohol per week. He reports that he does not use drugs. Mr. Stavely has No Known Allergies.   HPI  Today, he is being contacted for a post-procedure assessment.  Evaluation of last interventional procedure  06/17/2019 Procedure:   Type: Diagnostic Sacroiliac Joint Steroid Injection #1  and left L5-S1 ESI #2 Region: Inferior Lumbosacral Region Level: PSIS (Posterior Superior Iliac Spine) Laterality: Left-Side   Sedation: Please see nurses note for DOS. When no sedatives are used, the analgesic levels obtained are directly associated to the effectiveness of the local anesthetics. However, when sedation is provided, the level of analgesia obtained during the initial 1 hour following the intervention, is believed to be the result of a combination of factors. These factors may include, but are not limited to: 1. The effectiveness of the local anesthetics used. 2. The effects of the analgesic(s) and/or anxiolytic(s) used. 3. The degree of discomfort experienced by the patient at the time of the procedure. 4. The patients ability and reliability in recalling and recording the events. 5. The presence and influence of possible secondary gains and/or psychosocial factors. Reported result: Relief experienced during the 1st hour after the procedure: 100 % (Ultra-Short Term  Relief)             Interpretative annotation: Clinically appropriate result. Analgesia during this period is likely to be Local Anesthetic and/or IV Sedative (Analgesic/Anxiolytic) related.          Effects of local anesthetic: The analgesic effects attained during this period are directly associated to the localized infiltration of local anesthetics and therefore cary significant diagnostic value as to the etiological location, or anatomical origin, of the pain. Expected duration of relief is directly dependent on the pharmacodynamics of the local anesthetic used. Long-acting (4-6 hours) anesthetics used.  Reported result: Relief during the next 4 to 6 hour after the procedure: 100 % (Short-Term Relief)            Interpretative annotation: Clinically appropriate result. Analgesia during this period is likely to be Local Anesthetic-related.          Long-term benefit: Defined as the period of time past the expected duration of local anesthetics (1 hour for short-acting and 4-6 hours for long-acting). With the possible exception of prolonged sympathetic blockade from the local anesthetics, benefits during this period are typically attributed to, or associated with, other factors such as analgesic sensory neuropraxia, antiinflammatory effects, or beneficial biochemical changes provided by agents other than the local anesthetics.  Reported result: Extended relief following procedure: 75 % (Long-Term Relief)            Interpretative annotation: Clinically appropriate result. Good relief. No permanent benefit expected. Inflammation plays a part in the etiology to the pain.           Laboratory Chemistry Profile (12 mo)  Renal: No results found for requested labs within last 8760 hours.  Lab Results  Component Value Date   GFRAA >60 03/10/2018   GFRNONAA >60 03/10/2018   Hepatic: No results found for requested labs within last 8760 hours. Lab Results  Component Value Date   AST 18 03/10/2018   ALT 16 (L) 03/10/2018    Other: No results found for requested labs within last 8760 hours. Note: Above Lab results reviewed.  Assessment  The primary encounter diagnosis was Lumbar radiculopathy. Diagnoses of Chronic left SI joint pain, Chronic pain syndrome, Chronic left hip pain, Lumbar degenerative disc disease, Sacroiliac joint dysfunction of left side, and Primary osteoarthritis of left knee were also pertinent to this visit.  Plan of Care  I have discontinued Jessy Oto. Bussiere Jr.'s TURMERIC PO. I am also having him maintain his nitroGLYCERIN, sildenafil, aspirin, vitamin B-12, pravastatin, CoQ10, COLLAGEN PO, amLODipine, amoxicillin, and zinc sulfate.  Patient follows up today after left L5-S1 ESI #2 along with left sacroiliac joint injection.  He endorses that both of these interventions were helpful for his left low back, left buttock pain.  He states that he ambulates with greater ease and less pain.  He continues to endorse hip pain.  He continues to play golf.  We discussed doing a formal physical therapy program for his left hip.  Patient states that he will do that at [facility] with his physical therapist.  Moving forward, we will place PRN order for lumbar ESI and SI joint injection should patient like to repeat if and when his pain returns to an uncomfortable level.  Patient endorsed understanding.  Orders:  Orders Placed This Encounter  Procedures  . Lumbar Epidural Injection    Standing Status:   Standing    Number of Occurrences:   9    Standing Expiration Date:   01/17/2021  Scheduling Instructions:     Purpose: Palliative     Indication: Lower extremity pain/Sciatica unspecified side (M54.30).     Side: Midline     Level: TBD     Sedation: Patient's choice.     TIMEFRAME: PRN procedure. (Mr. Prokop will call when needed.)          Patient will need to stop aspirin 325 mg 7 days prior to scheduled procedure.  Can supplement with 81 mg instead.    Order Specific Question:   Where will this  procedure be performed?    Answer:   [facility]  . SACROILIAC JOINT INJECTION    For low back pain and groin pain.    Standing Status:   Standing    Number of Occurrences:   1    Standing Expiration Date:   07/20/2020    Scheduling Instructions:     Side: LEFT     Sedation: Patient's choice.     TIMEFRAME: PRN procedure. (Mr. Gabourel will call when needed.)     Patient will need to stop aspirin 325 mg 7 days prior to scheduled procedure.  Can supplement with 81 mg instead.    Order Specific Question:   Where will this procedure be performed?    Answer:   [facility]   Follow-up plan:   Return if symptoms worsen or fail to improve.     Status post left L5-S1 ESI #1 on 05/06/2019, and #2 on 06/17/2019 as well as left sacroiliac joint injection #1 on 06/17/2019: Helpful, repeat PRN    Recent Visits Date Type Provider Dept  06/17/19 Procedure visit Gillis Santa, MD [facility]  06/03/19 Office Visit Gillis Santa, MD [facility]  05/06/19 Procedure visit Gillis Santa, MD [facility]  04/28/19 Office Visit Gillis Santa, MD [facility]  Showing recent visits within past 90 days and meeting all other requirements   Today's Visits Date Type Provider Dept  07/21/19 Office Visit Gillis Santa, MD [facility]  Showing today's visits and meeting all other requirements   Future Appointments No visits were found meeting these conditions.  Showing future appointments within next 90 days and meeting all other requirements   I discussed the assessment and treatment plan with the patient. The patient was provided an opportunity to ask questions and all were answered. The patient agreed with the plan and demonstrated an understanding of the instructions.  Patient advised to call back or seek an in-person evaluation if the symptoms or condition worsens.  Total duration of non-face-to-face encounter: 75minutes.  Note by: Gillis Santa, MD Date: 07/21/2019; Time: 9:08 AM  Note: This dictation was prepared with Dragon dictation. Any transcriptional errors that may result from this process are unintentional.  Disclaimer:  * Given the special circumstances of the COVID-19 pandemic, the federal government has announced that the Office for Civil Rights (OCR) will exercise its enforcement discretion and will not impose penalties on physicians using telehealth in the event of noncompliance with regulatory requirements under the [place] and [place] (HIPAA) in connection with the good faith provision of telehealth during the XX123456 national public health emergency. ([place])

## 2019-08-20 ENCOUNTER — Telehealth: Payer: Self-pay | Admitting: Student in an Organized Health Care Education/Training Program

## 2019-08-20 NOTE — Telephone Encounter (Signed)
Patient lvmail stating he is experiencing pain in his right knee, he may have hurt it. Wanted to know if you could do anything for it as he has a golf event coming up 08-28-19 that he would like to go to.

## 2019-08-20 NOTE — Telephone Encounter (Signed)
Spoke with patient, this in new onset of pain, began when squatting. Patient advised to see PCP or ortho since this in acute situation.

## 2019-09-29 DIAGNOSIS — U071 COVID-19: Secondary | ICD-10-CM

## 2019-09-29 HISTORY — DX: COVID-19: U07.1

## 2019-11-04 DIAGNOSIS — Z8616 Personal history of COVID-19: Secondary | ICD-10-CM | POA: Insufficient documentation

## 2020-02-21 ENCOUNTER — Emergency Department: Payer: Medicare Other

## 2020-02-21 ENCOUNTER — Encounter: Payer: Self-pay | Admitting: Emergency Medicine

## 2020-02-21 ENCOUNTER — Emergency Department
Admission: EM | Admit: 2020-02-21 | Discharge: 2020-02-21 | Disposition: A | Discharge status: Home / Self Care | Payer: Medicare Other | Attending: Emergency Medicine | Admitting: Emergency Medicine

## 2020-02-21 ENCOUNTER — Other Ambulatory Visit: Payer: Self-pay

## 2020-02-21 DIAGNOSIS — R0602 Shortness of breath: Secondary | ICD-10-CM | POA: Insufficient documentation

## 2020-02-21 DIAGNOSIS — R42 Dizziness and giddiness: Secondary | ICD-10-CM | POA: Insufficient documentation

## 2020-02-21 DIAGNOSIS — I10 Essential (primary) hypertension: Secondary | ICD-10-CM | POA: Diagnosis not present

## 2020-02-21 DIAGNOSIS — Z8616 Personal history of COVID-19: Secondary | ICD-10-CM | POA: Diagnosis not present

## 2020-02-21 DIAGNOSIS — Z85828 Personal history of other malignant neoplasm of skin: Secondary | ICD-10-CM | POA: Insufficient documentation

## 2020-02-21 DIAGNOSIS — Z7982 Long term (current) use of aspirin: Secondary | ICD-10-CM | POA: Diagnosis not present

## 2020-02-21 DIAGNOSIS — I252 Old myocardial infarction: Secondary | ICD-10-CM | POA: Insufficient documentation

## 2020-02-21 DIAGNOSIS — R0789 Other chest pain: Secondary | ICD-10-CM | POA: Diagnosis not present

## 2020-02-21 DIAGNOSIS — I2510 Atherosclerotic heart disease of native coronary artery without angina pectoris: Secondary | ICD-10-CM | POA: Insufficient documentation

## 2020-02-21 HISTORY — DX: COVID-19: U07.1

## 2020-02-21 LAB — BASIC METABOLIC PANEL
Anion gap: 6 (ref 5–15)
BUN: 20 mg/dL (ref 8–23)
CO2: 29 mmol/L (ref 22–32)
Calcium: 9.3 mg/dL (ref 8.9–10.3)
Chloride: 108 mmol/L (ref 98–111)
Creatinine, Ser: 1.09 mg/dL (ref 0.61–1.24)
GFR calc Af Amer: >60 mL/min (ref >60)
GFR calc non Af Amer: >60 mL/min (ref >60)
Glucose, Bld: 108 mg/dL — ABNORMAL HIGH (ref 70–99)
Potassium: 4 mmol/L (ref 3.5–5.1)
Sodium: 143 mmol/L (ref 135–145)

## 2020-02-21 LAB — CBC
HCT: 44.5 % (ref 39.0–52.0)
Hemoglobin: 15.5 g/dL (ref 13.0–17.0)
MCH: 33.6 pg (ref 26.0–34.0)
MCHC: 34.8 g/dL (ref 30.0–36.0)
MCV: 96.5 fL (ref 80.0–100.0)
Platelets: 168 10*3/uL (ref 150–400)
RBC: 4.61 MIL/uL (ref 4.22–5.81)
RDW: 12.6 % (ref 11.5–15.5)
WBC: 6.6 10*3/uL (ref 4.0–10.5)
nRBC: 0 % (ref 0.0–0.2)

## 2020-02-21 LAB — TROPONIN I (HIGH SENSITIVITY): Troponin I (High Sensitivity): 6 ng/L (ref <18)

## 2020-02-21 NOTE — ED Notes (Signed)
Pt refused second troponin draw, stating, "this has happened before, when the first one comes back negative, the second one always is too"  Dr. Cherylann Banas made aware, no acute changes noted.  GCS 15, RR even/unlabored, +PMSC x4, skin warm/dry/pink.  Denies chest pain.  Awaiting Dispo.

## 2020-02-21 NOTE — ED Triage Notes (Signed)
Pt arrived via POV with c/o CP and SOB for several days, pt states he was using push mower yesterday which exacerbated sxs.  Pt reports the pain is more of a tightness in his chest. Pt also reports feeling lightheaded.

## 2020-02-21 NOTE — ED Provider Notes (Signed)
[facility] Emergency Department Provider Note ____________________________________________   First MD Initiated Contact with Patient 02/21/20 [phone]     (approximate)  I have reviewed the triage vital signs and the nursing notes.   HISTORY  Chief Complaint Chest Pain and Shortness of Breath    HPI Ricardo Beri. is a 74 y.o. male with PMH as noted below who presents with chest discomfort over the last 3 to 4 days, intermittent, and described mainly as tightness.  It has mostly been unrelated to activity, but he did feel it more yesterday after he used a push mower.  He also fell today after walking his dog.  He reports mild associated shortness of breath.  He has mild lightheadedness as well, but no nausea or vomiting.  Past Medical History:  Diagnosis Date  . Anginal pain ([place])   . Cancer ([place])    skin  . Coronary artery disease   . COVID-17 Oct 2019  . Edema   . Hypertension   . Myocardial infarction ([place])   . Obesity     Patient Active Problem List   Diagnosis Date Noted  . Chronic pain syndrome 06/03/2019  . Lumbar radiculopathy 06/03/2019  . Primary osteoarthritis of left knee 06/02/2018  . Chronic left SI joint pain 06/02/2018  . SI joint arthritis (LEFT) 06/02/2018  . Syncope 03/09/2018  . Elevated troponin 03/09/2018  . CAD (coronary artery disease) 03/09/2018  . HTN (hypertension) 03/09/2018  . Chronic left hip pain 07/08/2017  . 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 Primary osteoarthritis of left hip 04/19/2016    Past Surgical History:  Procedure Laterality Date  . COLONOSCOPY WITH PROPOFOL N/A 12/12/2015   Procedure: COLONOSCOPY WITH PROPOFOL;  Surgeon: [name], MD;  Location: [facility] ENDOSCOPY;  Service: Endoscopy;  Laterality: N/A;  . SHOULDER ACROMIOPLASTY      Prior to Admission medications   Medication Sig Start Date End Date Taking? Authorizing Provider  amLODipine (NORVASC) 2.5 MG  tablet Take 2.5 mg by mouth daily. 06/03/18 06/17/19  [provider]  amoxicillin (AMOXIL) 500 MG capsule Take 500 mg by mouth PRO. Prior to dental work 12/04/16   [provider]  aspirin 325 MG EC tablet Take 325 mg by mouth daily.    [provider]  Coenzyme Q10 (COQ10) 100 MG CAPS Take by mouth.    [provider]  COLLAGEN PO Take 1 Scoop by mouth.    [provider]  nitroGLYCERIN (NITROSTAT) 0.4 MG SL tablet Place 0.4 mg under the tongue every 5 (five) minutes as needed for chest pain. Reported on 12/12/2015    [provider]  pravastatin (PRAVACHOL) 20 MG tablet Take 20 mg by mouth daily.    [provider]  sildenafil (REVATIO) 20 MG tablet Take 20 mg by mouth daily as needed (ED).     [provider]  vitamin B-12 (CYANOCOBALAMIN) 500 MCG tablet Take 500 mcg by mouth daily.    [provider]  zinc sulfate 220 (50 Zn) MG capsule Take 50 mg by mouth daily.    [provider]    Allergies Patient has no known allergies.  History reviewed. No pertinent family history.  Social History Social History   Tobacco Use  . Smoking status: Never Smoker  . Smokeless tobacco: Never Used  Substance Use Topics  . Alcohol use: Yes    Alcohol/week: 4.0 standard drinks    Types: 4  Glasses of wine per week  . Drug use: No    Review of Systems  Constitutional: No fever. Eyes: No redness. ENT: No neck pain Cardiovascular: Positive for chest discomfort. Respiratory: Positive for shortness of breath. Gastrointestinal: No vomiting or diarrhea.  Genitourinary: Negative for flank pain.  Musculoskeletal: Negative for back pain. Skin: Negative for rash. Neurological: Negative for headache.   ____________________________________________   PHYSICAL EXAM:  VITAL SIGNS: ED Triage Vitals  Enc Vitals Group     BP 02/21/20 0935 (!) 154/88     Pulse Rate 02/21/20 0935 60     Resp 02/21/20 0935 18      Temp 02/21/20 0935 98.4 F (36.9 C)     Temp Source 02/21/20 0935 Oral     SpO2 02/21/20 0935 99 %     Weight 02/21/20 0937 225 lb (102.1 kg)     Height 02/21/20 0937 6' (1.829 m)     Head Circumference --      Peak Flow --      Pain Score 02/21/20 0936 1     Pain Loc --      Pain Edu? --      Excl. in [place]? --     Constitutional: Alert and oriented. Well appearing and in no acute distress. Eyes: Conjunctivae are normal.  Head: Atraumatic. Nose: No congestion/rhinnorhea. Mouth/Throat: Mucous membranes are moist.   Neck: Normal range of motion.  Cardiovascular: Normal rate, regular rhythm. Grossly normal heart sounds.  Good peripheral circulation. Respiratory: Normal respiratory effort.  No retractions. Lungs CTAB. Gastrointestinal: No distention.  Musculoskeletal: No lower extremity edema.  Extremities warm and well perfused.  Neurologic:  Normal speech and language. No gross focal neurologic deficits are appreciated.  Skin:  Skin is warm and dry. No rash noted. Psychiatric: Mood and affect are normal. Speech and behavior are normal.  ____________________________________________   LABS (all labs ordered are listed, but only abnormal results are displayed)  Labs Reviewed  BASIC METABOLIC PANEL - Abnormal; Notable for the following components:      Result Value   Glucose, Bld 108 (*)    All other components within normal limits  CBC  TROPONIN I (HIGH SENSITIVITY)  TROPONIN I (HIGH SENSITIVITY)   ____________________________________________  EKG  ED ECG REPORT I, Arta Silence, the attending physician, personally viewed and interpreted this ECG.  Date: 02/21/2020 EKG Time: 0928 Rate: 60 Rhythm: normal sinus rhythm QRS Axis: normal Intervals: normal ST/T Wave abnormalities: normal Narrative Interpretation: no evidence of acute ischemia; no significant change when compared to EKG of 03/09/2018  ____________________________________________  RADIOLOGY  CXR: No  focal infiltrate or other acute abnormality  ____________________________________________   PROCEDURES  Procedure(s) performed: No  Procedures  Critical Care performed: No ____________________________________________   INITIAL IMPRESSION / ASSESSMENT AND PLAN / ED COURSE  Pertinent labs & imaging results that were available during my care of the patient were reviewed by me and considered in my medical decision making (see chart for details).  74 year old male with PMH as noted above including a history of CAD status post stent presents with some intermittent and mostly nonexertional chest discomfort over the last several days.  It is not present currently.  I reviewed the past medical records in [place].  The patient has a history of MI and had a catheterization in 2015.  On exam, he is very well-appearing.  His vital signs are normal except for mild hypertension.  The physical exam is otherwise unremarkable.  EKG shows no acute changes.  Differential includes ACS, musculoskeletal pain, indigestion or other benign etiology.  There is no clinical evidence to support PE, aortic dissection, or other vascular etiology given that the discomfort has resolved.  Given the patient's elevated risk of ACS we will obtain basic labs, cardiac enzymes, and chest x-ray.  ----------------------------------------- 11:26 AM on 02/21/2020 -----------------------------------------  Initial troponin and other work-up is negative.  The patient states that he would like to go home.  He states he feels fine.  He states that he has been through this before with a negative work-up.  He understands that without doing the repeat troponin I cannot completely rule out ACS, although he is comfortable with this risk.  He understands he is at elevated risk due to his CAD history.  I also informed him that he may return at any time if he changes his mind and should return immediately if he has any new, worsening, or  recurrent symptoms.  He expresses understanding.  He demonstrates appropriate decision-making capacity at this time.    ____________________________________________   FINAL CLINICAL IMPRESSION(S) / ED DIAGNOSES  Final diagnoses:  Atypical chest pain      NEW MEDICATIONS STARTED DURING THIS VISIT:  New Prescriptions   No medications on file     Note:  This document was prepared using Dragon voice recognition software and may include unintentional dictation errors.    Arta Silence, MD 02/21/20 [phone]

## 2020-02-21 NOTE — Discharge Instructions (Addendum)
Follow-up with Dr. Clayborn Bigness as scheduled.  Return to the ER for new, worsening, or persistent severe chest discomfort, tightness, shortness of breath, weakness, or any other new or worsening symptoms that concern you.

## 2020-03-31 ENCOUNTER — Telehealth: Payer: Self-pay | Admitting: *Deleted

## 2020-03-31 NOTE — Telephone Encounter (Signed)
Spoke with patient, he states that Dr Rudene Christians has recommended he have a right SI joint injection.  Patient is calling to see if we can do that since he is already working with [place] with interventional pain relief.  States he also has a knee issue that has been injected by [place] before and has flared back up and he would like to also address this issue.  I told patient that I would convey this information to Dr Holley Raring and we would let him know if a VV would be needed or if we could just schedule procedure.  Patient verbalizes u/o information.

## 2020-03-31 NOTE — Telephone Encounter (Signed)
[name],  I will need to see [name] in person.  Insurance usually requires a physical exam to document SI joint pathology before they will approve a SI joint injection.  Please try and get him him next week. Can discuss knees with him at that time.   Thanks

## 2020-04-13 ENCOUNTER — Other Ambulatory Visit: Payer: Self-pay

## 2020-04-13 ENCOUNTER — Encounter: Payer: Self-pay | Admitting: Student in an Organized Health Care Education/Training Program

## 2020-04-13 ENCOUNTER — Ambulatory Visit
Payer: Medicare Other | Attending: Student in an Organized Health Care Education/Training Program | Admitting: Student in an Organized Health Care Education/Training Program

## 2020-04-13 VITALS — BP 154/88 | HR 63 | Temp 97.7°F | Resp 16 | Ht 72.0 in | Wt 232.0 lb

## 2020-04-13 DIAGNOSIS — G8929 Other chronic pain: Secondary | ICD-10-CM | POA: Diagnosis present

## 2020-04-13 DIAGNOSIS — M1612 Unilateral primary osteoarthritis, left hip: Secondary | ICD-10-CM | POA: Diagnosis present

## 2020-04-13 DIAGNOSIS — G894 Chronic pain syndrome: Secondary | ICD-10-CM | POA: Diagnosis present

## 2020-04-13 DIAGNOSIS — M249 Joint derangement, unspecified: Secondary | ICD-10-CM | POA: Diagnosis not present

## 2020-04-13 DIAGNOSIS — G5701 Lesion of sciatic nerve, right lower limb: Secondary | ICD-10-CM | POA: Diagnosis present

## 2020-04-13 DIAGNOSIS — M1712 Unilateral primary osteoarthritis, left knee: Secondary | ICD-10-CM | POA: Diagnosis present

## 2020-04-13 DIAGNOSIS — Z96641 Presence of right artificial hip joint: Secondary | ICD-10-CM | POA: Diagnosis present

## 2020-04-13 DIAGNOSIS — M533 Sacrococcygeal disorders, not elsewhere classified: Secondary | ICD-10-CM | POA: Diagnosis present

## 2020-04-13 NOTE — Progress Notes (Signed)
Safety precautions to be maintained throughout the outpatient stay will include: orient to surroundings, keep bed in low position, maintain call bell within reach at all times, provide assistance with transfer out of bed and ambulation.  

## 2020-04-13 NOTE — Patient Instructions (Addendum)
It was nice seeing you today Ricardo Winters.  Today we discussed performing a right sacroiliac joint injection for SI joint instability and arthritis.  At the same time we will also do a right piriformis injection for piriformis syndrome.    In regards to your left knee pain that has been responsive to prior left knee genicular nerve block, we can plan on repeating that.  Schedule right SI joint injection and right piriformis injection on same day.  2 to 3 weeks after can schedule left genicular nerve block.Preparing for your procedure (without sedation) Instructions: . Oral Intake: Do not eat or drink anything for at least 3 hours prior to your procedure. . Transportation: Unless otherwise stated by your physician, you may drive yourself after the procedure. . Blood Pressure Medicine: Take your blood pressure medicine with a sip of water the morning of the procedure. . Insulin: Take only  of your normal insulin dose. . Preventing infections: Shower with an antibacterial soap the morning of your procedure. . Build-up your immune system: Take 1000 mg of Vitamin C with every meal (3 times a day) the day prior to your procedure. . Pregnancy: If you are pregnant, call and cancel the procedure. . Sickness: If you have a cold, fever, or any active infections, call and cancel the procedure. . Arrival: You must be in the facility at least 30 minutes prior to your scheduled procedure. . Children: Do not bring any children with you. . Dress appropriately: Bring dark clothing that you would not mind if they get stained. . Valuables: Do not bring any jewelry or valuables. Procedure appointments are reserved for interventional treatments only. Marland Kitchen No Prescription Refills. . No medication changes will be discussed during procedure appointments. No disability issues will be discussed.

## 2020-04-13 NOTE — Progress Notes (Signed)
PROVIDER NOTE: Information contained herein reflects review and annotations entered in association with encounter. Interpretation of such information and data should be left to medically-trained personnel. Information provided to patient can be located elsewhere in the medical record under "Patient Instructions". Document created using STT-dictation technology, any transcriptional errors that may result from process are unintentional.    Patient: Ricardo Winters.  Service Category: E/M  Provider: Gillis Santa, MD  DOB: 13-Sep-1946  DOS: 04/13/2020  Specialty: Interventional Pain Management  MRN: [id number]  Setting: Ambulatory outpatient  PCP: Rusty Aus, MD  Type: Established Patient    Referring Provider: Rusty Aus, MD  Location: Office  Delivery: Face-to-face     HPI  Reason for encounter: [name]., a 74 y.o. year old male, is here today for evaluation and management of his Chronic right SI joint pain [M53.3, G89.29]. [name] primary complain today is Back Pain (right, lower) Last encounter: Practice (03/31/2020). My last encounter with him was on Visit date not found. Pertinent problems: Ricardo Winters has Primary osteoarthritis of left hip; Status post total replacement of right hip; Primary osteoarthritis of left knee; Chronic pain syndrome; Piriformis syndrome, right; and Derangement of right SI joint on their pertinent problem list. Pain Assessment: Severity of Chronic pain is reported as a 3 /10. Location: Back Right, Upper/right hip and right upper leg. Onset: More than a month ago. Quality: Dull. Timing: Intermittent. Modifying factor(s): CBD, Advil. Vitals:  height is 6' (1.829 m) and weight is 232 lb (105.2 kg). His temporal temperature is 97.7 F (36.5 C). His blood pressure is 154/88 (abnormal) and his pulse is 63. His respiration is 16 and oxygen saturation is 100%.   Ricardo Winters is a very pleasant 74 year old male who presents with chief complaint of right  buttock, right hip and right SI joint related pain. He is also endorsing left knee pain. He is status post left knee genicular nerve block which was last performed on 06/09/2018 which provided him with significant pain relief, greater than 50% for more than 6 months with improvement in his functional status as well. Patient is an avid [employer] cabin [employer]. He has done physical therapy over the last year for his hip and SI joint related pain. He continues to have persistent pain in his right buttock which occasionally does radiate to his groin region. He has spoken to Dr. Rudene Christians with orthopedics who also recommended that [name] consider a right diagnostic SI joint injection. Otherwise no significant changes in his medical history since her last visit.  ROS  Constitutional: Denies any fever or chills Gastrointestinal: No reported hemesis, hematochezia, vomiting, or acute GI distress Musculoskeletal: Denies any acute onset joint swelling, redness, loss of ROM, or weakness Neurological: No reported episodes of acute onset apraxia, aphasia, dysarthria, agnosia, amnesia, paralysis, loss of coordination, or loss of consciousness  Medication Review  CoQ10, Collagen, Grape Seed, NON FORMULARY, Omega-3, Turmeric, Vitamin C, Vitamin D3, amLODipine, amoxicillin, aspirin, nitroGLYCERIN, pravastatin, sildenafil, vitamin B-12, and zinc sulfate  History Review  Allergy: Ricardo Winters has No Known Allergies. Drug: Ricardo Winters  reports no history of drug use. Alcohol:  reports current alcohol use of about 4.0 standard drinks of alcohol per week. Tobacco:  reports that he has never smoked. He has never used smokeless tobacco. Social: Mr. Rajan  reports that he has never smoked. He has never used smokeless tobacco. He reports current alcohol use of about 4.0 standard drinks of alcohol per week. He reports that he  does not use drugs. Medical:  has a past medical history of Anginal pain ([place]), Cancer ([place]), Coronary artery disease,  COVID-19, Edema, Hypertension, Myocardial infarction ([place]), and Obesity. Surgical: Ricardo Winters  has a past surgical history that includes Shoulder acromioplasty and Colonoscopy with propofol (N/A, 12/12/2015). Family: family history is not on file.  Laboratory Chemistry Profile   Renal Lab Results  Component Value Date   BUN 20 02/21/2020   CREATININE 1.09 02/21/2020   GFRAA >60 02/21/2020   GFRNONAA >60 02/21/2020     Hepatic Lab Results  Component Value Date   AST 18 03/10/2018   ALT 16 (L) 03/10/2018   ALBUMIN 3.2 (L) 03/10/2018   ALKPHOS 57 03/10/2018     Electrolytes Lab Results  Component Value Date   NA 143 02/21/2020   K 4.0 02/21/2020   CL 108 02/21/2020   CALCIUM 9.3 02/21/2020     Bone No results found for: VD25OH, VD125OH2TOT, IZ1245YK9, [id number], 25OHVITD1, 25OHVITD2, 25OHVITD3, TESTOFREE, TESTOSTERONE   Inflammation (CRP: Acute Phase) (ESR: Chronic Phase) No results found for: CRP, ESRSEDRATE, LATICACIDVEN     Note: Above Lab results reviewed.  Recent Imaging Review  DG Chest 2 View CLINICAL DATA:  74 year old male with history of chest pain.  EXAM: CHEST - 2 VIEW  COMPARISON:  Chest x-ray 03/09/2018.  FINDINGS: Lung volumes are normal. No consolidative airspace disease. No pleural effusions. No pneumothorax. No pulmonary nodule or mass noted. Pulmonary vasculature and the cardiomediastinal silhouette are within normal limits.  IMPRESSION: No radiographic evidence of acute cardiopulmonary disease.  Electronically Signed   By: Vinnie Langton M.D.   On: 02/21/2020 10:37 Note: Reviewed        Physical Exam  General appearance: Well nourished, well developed, and well hydrated. In no apparent acute distress Mental status: Alert, oriented x 3 (person, place, & time)       Respiratory: No evidence of acute respiratory distress Eyes: PERLA Vitals: BP (!) 154/88    Pulse 63    Temp 97.7 F (36.5 C) (Temporal)    Resp 16    Ht 6' (1.829 m)     Wt 232 lb (105.2 kg)    SpO2 100%    BMI 31.46 kg/m  BMI: Estimated body mass index is 31.46 kg/m as calculated from the following:   Height as of this encounter: 6' (1.829 m).   Weight as of this encounter: 232 lb (105.2 kg). Ideal: Ideal body weight: 77.6 kg (171 lb 1.2 oz) Adjusted ideal body weight: 88.7 kg (195 lb 7.1 oz)  Lumbar Spine Area Exam  Skin & Axial Inspection: No masses, redness, or swelling Alignment: Symmetrical Functional ROM: Unrestricted ROM       Stability: No instability detected Muscle Tone/Strength: Functionally intact. No obvious neuro-muscular anomalies detected. Sensory (Neurological): Musculoskeletal pain pattern Palpation: No palpable anomalies       Provocative Tests: Hyperextension/rotation test: deferred today       Lumbar quadrant test (Kemp's test): deferred today       Lateral bending test: deferred today       Patrick's Maneuver: (+) for right-sided S-I arthralgia             FABER* test: (+) for right-sided S-I arthralgia             S-I anterior distraction/compression test: (+)   S-I arthralgia/arthropathy S-I lateral compression test: (+)   S-I arthralgia/arthropathy S-I Thigh-thrust test: deferred today         S-I Gaenslen's test:  deferred today         *(Flexion, ABduction and External Rotation) Gait & Posture Assessment  Ambulation: Unassisted Gait: Relatively normal for age and body habitus Posture: WNL  Lower Extremity Exam    Side: Right lower extremity  Side: Left lower extremity  Stability: No instability observed          Stability: No instability observed          Skin & Extremity Inspection: Skin color, temperature, and hair growth are WNL. No peripheral edema or cyanosis. No masses, redness, swelling, asymmetry, or associated skin lesions. No contractures.  Skin & Extremity Inspection: Skin color, temperature, and hair growth are WNL. No peripheral edema or cyanosis. No masses, redness, swelling, asymmetry, or associated skin  lesions. No contractures.  Functional ROM: Unrestricted ROM                  Functional ROM: Pain restricted ROM for knee joint          Muscle Tone/Strength: Functionally intact. No obvious neuro-muscular anomalies detected.  Muscle Tone/Strength: Functionally intact. No obvious neuro-muscular anomalies detected.  Sensory (Neurological): Unimpaired        Sensory (Neurological): Arthropathic arthralgia        DTR: Patellar: deferred today Achilles: deferred today Plantar: deferred today  DTR: Patellar: deferred today Achilles: deferred today Plantar: deferred today  Palpation: No palpable anomalies  Palpation: No palpable anomalies    Assessment   Status Diagnosis  Persistent Persistent Persistent 1. Chronic right SI joint pain   2. Derangement of right SI joint   3. Piriformis syndrome, right   4. Primary osteoarthritis of left knee   5. Chronic pain syndrome   6. Status post total replacement of right hip   7. Primary osteoarthritis of left hip      Updated Problems: Problem  Piriformis Syndrome, Right  Derangement of Right Si Joint  Chronic Pain Syndrome  Primary Osteoarthritis of Left Knee  Status Post Total Replacement of Right Hip  Primary Osteoarthritis of Left Hip  Chronic Right Si Joint Pain    Plan of Care   Right sacroiliac joint related pain: Clinical history and exam could suggest SI joint involvement and arthropathy. Would also consider doing a right piriformis injection at the same time as right SI joint injection. Risks and benefits of this procedure were discussed and patient would like to proceed.  Left knee pain related to knee osteoarthritis: Repeat genicular nerve block, last on 06/09/2018 which provided greater than 50% pain relief for at least 6 months with gradual return of pain thereafter  Orders:  Orders Placed This Encounter  Procedures   SACROILIAC JOINT INJECTION    Standing Status:   Future    Standing Expiration Date:   05/13/2020     Scheduling Instructions:     R- SI-J w/o sedation    Order Specific Question:   Where will this procedure be performed?    Answer:   [facility]   TRIGGER POINT INJECTION    Standing Status:   Future    Standing Expiration Date:   05/13/2020    Scheduling Instructions:     Right piriformis injection w/o sedation    Order Specific Question:   Where will this procedure be performed?    Answer:   [facility]   GENICULAR NERVE BLOCK    Indication(s):  Sub-acute knee pain    Standing Status:   Future    Standing Expiration Date:  05/13/2020    Scheduling Instructions:     Left GNB w/o sedation    Order Specific Question:   Where will this procedure be performed?    Answer:   [facility]   Follow-up plan:   Return in about 1 week (around 04/20/2020) for Right SIJ, right piriformis, without sedation.     Status post left L5-S1 ESI #1 on 05/06/2019, and #2 on 06/17/2019 as well as left sacroiliac joint injection #1 on 06/17/2019: Helpful, repeat PRN     Recent Visits No visits were found meeting these conditions. Showing recent visits within past 90 days and meeting all other requirements Today's Visits Date Type Provider Dept  04/13/20 Office Visit Gillis Santa, MD [facility]  Showing today's visits and meeting all other requirements Future Appointments Date Type Provider Dept  04/20/20 Appointment Gillis Santa, MD [facility]  Showing future appointments within next 90 days and meeting all other requirements  I discussed the assessment and treatment plan with the patient. The patient was provided an opportunity to ask questions and all were answered. The patient agreed with the plan and demonstrated an understanding of the instructions.  Patient advised to call back or seek an in-person evaluation if the symptoms or condition worsens.  Duration of encounter: 25 minutes.  Note by: Gillis Santa, MD Date: 04/13/2020; Time: 3:01 PM

## 2020-04-20 ENCOUNTER — Ambulatory Visit (HOSPITAL_BASED_OUTPATIENT_CLINIC_OR_DEPARTMENT_OTHER): Payer: Medicare Other | Admitting: Student in an Organized Health Care Education/Training Program

## 2020-04-20 ENCOUNTER — Encounter: Payer: Self-pay | Admitting: Student in an Organized Health Care Education/Training Program

## 2020-04-20 ENCOUNTER — Other Ambulatory Visit: Payer: Self-pay

## 2020-04-20 ENCOUNTER — Ambulatory Visit
Admission: RE | Admit: 2020-04-20 | Discharge: 2020-04-20 | Disposition: A | Discharge status: Home / Self Care | Payer: Medicare Other | Source: Ambulatory Visit | Attending: Student in an Organized Health Care Education/Training Program | Admitting: Student in an Organized Health Care Education/Training Program

## 2020-04-20 VITALS — BP 162/94 | HR 53 | Temp 97.3°F | Resp 16 | Ht 72.0 in | Wt 226.0 lb

## 2020-04-20 DIAGNOSIS — M533 Sacrococcygeal disorders, not elsewhere classified: Secondary | ICD-10-CM

## 2020-04-20 DIAGNOSIS — M5388 Other specified dorsopathies, sacral and sacrococcygeal region: Secondary | ICD-10-CM | POA: Insufficient documentation

## 2020-04-20 DIAGNOSIS — M549 Dorsalgia, unspecified: Secondary | ICD-10-CM | POA: Diagnosis present

## 2020-04-20 DIAGNOSIS — M249 Joint derangement, unspecified: Secondary | ICD-10-CM

## 2020-04-20 DIAGNOSIS — G5701 Lesion of sciatic nerve, right lower limb: Secondary | ICD-10-CM | POA: Insufficient documentation

## 2020-04-20 DIAGNOSIS — Z7982 Long term (current) use of aspirin: Secondary | ICD-10-CM | POA: Insufficient documentation

## 2020-04-20 DIAGNOSIS — Z79899 Other long term (current) drug therapy: Secondary | ICD-10-CM | POA: Insufficient documentation

## 2020-04-20 DIAGNOSIS — G8929 Other chronic pain: Secondary | ICD-10-CM | POA: Diagnosis not present

## 2020-04-20 MED ORDER — METHYLPREDNISOLONE ACETATE 40 MG/ML IJ SUSP
40.0000 mg | Freq: Once | INTRAMUSCULAR | Status: AC
  Administered 2020-04-20: 40 mg via INTRA_ARTICULAR

## 2020-04-20 MED ORDER — LIDOCAINE HCL 2 % IJ SOLN
INTRAMUSCULAR | Status: AC
  Filled 2020-04-20: qty 20

## 2020-04-20 MED ORDER — METHYLPREDNISOLONE ACETATE 40 MG/ML IJ SUSP
INTRAMUSCULAR | Status: AC
  Filled 2020-04-20: qty 2

## 2020-04-20 MED ORDER — ROPIVACAINE HCL 2 MG/ML IJ SOLN
9.0000 mL | Freq: Once | INTRAMUSCULAR | Status: AC
  Administered 2020-04-20: 9 mL via PERINEURAL

## 2020-04-20 MED ORDER — ROPIVACAINE HCL 2 MG/ML IJ SOLN
INTRAMUSCULAR | Status: AC
  Filled 2020-04-20: qty 10

## 2020-04-20 MED ORDER — IOHEXOL 180 MG/ML  SOLN
10.0000 mL | Freq: Once | INTRAMUSCULAR | Status: AC | PRN
  Administered 2020-04-20: 10 mL via INTRA_ARTICULAR

## 2020-04-20 MED ORDER — LIDOCAINE HCL 2 % IJ SOLN
20.0000 mL | Freq: Once | INTRAMUSCULAR | Status: AC
  Administered 2020-04-20: 400 mg

## 2020-04-20 NOTE — Progress Notes (Signed)
PROVIDER NOTE: Information contained herein reflects review and annotations entered in association with encounter. Interpretation of such information and data should be left to medically-trained personnel. Information provided to patient can be located elsewhere in the medical record under "Patient Instructions". Document created using STT-dictation technology, any transcriptional errors that may result from process are unintentional.    Patient: Ricardo Winters.  Service Category: Procedure  Provider: Gillis Santa, MD  DOB: [birth year]  DOS: 04/20/2020  Location: [facility]  MRN: [id number]  Setting: Ambulatory - outpatient  Referring Provider: Rusty Aus, MD  Type: Established Patient  Specialty: Interventional Pain Management  PCP: Rusty Aus, MD   Primary Reason for Visit: Interventional Pain Management Treatment. CC: Back Pain (right)  Procedure:          Anesthesia, Analgesia, Anxiolysis:  Type: Diagnostic RIGHT Sacroiliac Joint Steroid Injection #1 & RIGHT Piriformis TPI/MNB Region: Inferior Lumbosacral Region Level: PIIS (Posterior Inferior Iliac Spine) Laterality: Right  Type: Local Anesthesia  Local Anesthetic: Lidocaine 1-2%  Position: Prone           Indications: 1. Chronic right SI joint pain   2. Derangement of right SI joint   3. Piriformis syndrome, right    Pain Score: Pre-procedure: 1 /10 Post-procedure: 1 /10   Pre-op Assessment:  Mr. Meno is a 74 y.o. (year old), male patient, seen today for interventional treatment. He  has a past surgical history that includes Shoulder acromioplasty and Colonoscopy with propofol (N/A, 12/12/2015). Mr. Selner has a current medication list which includes the following prescription(s): amoxicillin, vitamin c, aspirin, black pepper-turmeric, vitamin d3, coq10, collagen, grape seed, nitroglycerin, NON FORMULARY, omega-3, pravastatin, sildenafil, turmeric, vitamin b-12, zinc sulfate, and amlodipine. His  primarily concern today is the Back Pain (right)  Initial Vital Signs:  Pulse/HCG Rate: (!) 53ECG Heart Rate: (!) 54 Temp: (!) 97.3 F (36.3 C) Resp: 18 BP: (!) 187/96 SpO2: 100 %  BMI: Estimated body mass index is 30.65 kg/m as calculated from the following:   Height as of this encounter: 6' (1.829 m).   Weight as of this encounter: 226 lb (102.5 kg).  Risk Assessment: Allergies: Reviewed. He has No Known Allergies.  Allergy Precautions: None required Coagulopathies: Reviewed. None identified.  Blood-thinner therapy: None at this time Active Infection(s): Reviewed. None identified. Mr. Aspinall is afebrile  Site Confirmation: Mr. Rijo was asked to confirm the procedure and laterality before marking the site Procedure checklist: Completed Consent: Before the procedure and under the influence of no sedative(s), amnesic(s), or anxiolytics, the patient was informed of the treatment options, risks and possible complications. To fulfill our ethical and legal obligations, as recommended by the American Medical Association's Code of Ethics, I have informed the patient of my clinical impression; the nature and purpose of the treatment or procedure; the risks, benefits, and possible complications of the intervention; the alternatives, including doing nothing; the risk(s) and benefit(s) of the alternative treatment(s) or procedure(s); and the risk(s) and benefit(s) of doing nothing. The patient was provided information about the general risks and possible complications associated with the procedure. These may include, but are not limited to: failure to achieve desired goals, infection, bleeding, organ or nerve damage, allergic reactions, paralysis, and death. In addition, the patient was informed of those risks and complications associated to the procedure, such as failure to decrease pain; infection; bleeding; organ or nerve damage with subsequent damage to sensory, motor, and/or autonomic systems,  resulting in permanent pain, numbness, and/or weakness  of one or several areas of the body; allergic reactions; (i.e.: anaphylactic reaction); and/or death. Furthermore, the patient was informed of those risks and complications associated with the medications. These include, but are not limited to: allergic reactions (i.e.: anaphylactic or anaphylactoid reaction(s)); adrenal axis suppression; blood sugar elevation that in diabetics may result in ketoacidosis or comma; water retention that in patients with history of congestive heart failure may result in shortness of breath, pulmonary edema, and decompensation with resultant heart failure; weight gain; swelling or edema; medication-induced neural toxicity; particulate matter embolism and blood vessel occlusion with resultant organ, and/or nervous system infarction; and/or aseptic necrosis of one or more joints. Finally, the patient was informed that Medicine is not an exact science; therefore, there is also the possibility of unforeseen or unpredictable risks and/or possible complications that may result in a catastrophic outcome. The patient indicated having understood very clearly. We have given the patient no guarantees and we have made no promises. Enough time was given to the patient to ask questions, all of which were answered to the patient's satisfaction. Mr. Gadd has indicated that he wanted to continue with the procedure. Attestation: I, the ordering provider, attest that I have discussed with the patient the benefits, risks, side-effects, alternatives, likelihood of achieving goals, and potential problems during recovery for the procedure that I have provided informed consent. Date  Time: 04/20/2020  9:54 AM  Pre-Procedure Preparation:  Monitoring: As per clinic protocol. Respiration, ETCO2, SpO2, BP, heart rate and rhythm monitor placed and checked for adequate function Safety Precautions: Patient was assessed for positional comfort and  pressure points before starting the procedure. Time-out: I initiated and conducted the "Time-out" before starting the procedure, as per protocol. The patient was asked to participate by confirming the accuracy of the "Time Out" information. Verification of the correct person, site, and procedure were performed and confirmed by me, the nursing staff, and the patient. "Time-out" conducted as per Joint Commission's Universal Protocol (UP.01.01.01). Time: 1031  Description of Procedure:          Target Area: Inferior, posterior, aspect of the sacroiliac fissure Approach: Posterior, paraspinal, ipsilateral approach. Area Prepped: Entire Lower Lumbosacral Region DuraPrep (Iodine Povacrylex [0.7% available iodine] and Isopropyl Alcohol, 74% w/w) Safety Precautions: Aspiration looking for blood return was conducted prior to all injections. At no point did we inject any substances, as a needle was being advanced. No attempts were made at seeking any paresthesias. Safe injection practices and needle disposal techniques used. Medications properly checked for expiration dates. SDV (single dose vial) medications used. Description of the Procedure: Protocol guidelines were followed. The patient was placed in position over the procedure table. The target area was identified and the area prepped in the usual manner. Skin & deeper tissues infiltrated with local anesthetic. Appropriate amount of time allowed to pass for local anesthetics to take effect. The procedure needle was advanced under fluoroscopic guidance into the sacroiliac joint until a firm endpoint was obtained. Proper needle placement secured. Negative aspiration confirmed. Solution injected in intermittent fashion, asking for systemic symptoms every 0.5cc of injectate. The needles were then removed and the area cleansed, making sure to leave some of the prepping solution back to take advantage of its long term bactericidal properties. Vitals:   04/20/20  0953 04/20/20 1028 04/20/20 1035 04/20/20 1038  BP: (!) 187/96 (!) 166/93 (!) 162/94   Pulse: (!) 53     Resp: 18 16 17 16   Temp: (!) 97.3 F (36.3 C)  SpO2: 100% 99% 98% 98%  Weight: 226 lb (102.5 kg)     Height: 6' (1.829 m)       Start Time: 1032 hrs. End Time: 1038 hrs. Materials:  Needle(s) Type: Spinal Needle Gauge: 25G Length: 3.5-in Medication(s): Please see orders for medications and dosing details. 4 cc solution made of 3 cc of 0.2% ropivacaine, 1 cc of methylprednisolone, 40 mg/cc.  4 cc injected into the right SI joint.   Right piriformis injection also performed.  From the inferior border of the sacroiliac joint, needle was advanced 1 cm medial, 1 cm 1 cm inferior and 1 cm deep to the inferior pole of the SI joint.  Contrast was injected which showed it spreading into the piriformis muscle bed.  5 cc solution made of 4 cc of 0.2% ropivacaine, 1 cc of methylprednisolone, 40 mg/cc injected into right piriformis muscle.  Imaging Guidance (Non-Spinal):          Type of Imaging Technique: Fluoroscopy Guidance (Non-Spinal) Indication(s): Assistance in needle guidance and placement for procedures requiring needle placement in or near specific anatomical locations not easily accessible without such assistance. Exposure Time: Please see nurses notes. Contrast: Before injecting any contrast, we confirmed that the patient did not have an allergy to iodine, shellfish, or radiological contrast. Once satisfactory needle placement was completed at the desired level, radiological contrast was injected. Contrast injected under live fluoroscopy. No contrast complications. See chart for type and volume of contrast used. Fluoroscopic Guidance: I was personally present during the use of fluoroscopy. "Tunnel Vision Technique" used to obtain the best possible view of the target area. Parallax error corrected before commencing the procedure. "Direction-depth-direction" technique used to introduce  the needle under continuous pulsed fluoroscopy. Once target was reached, antero-posterior, oblique, and lateral fluoroscopic projection used confirm needle placement in all planes. Images permanently stored in EMR. Interpretation: I personally interpreted the imaging intraoperatively. Adequate needle placement confirmed in multiple planes. Appropriate spread of contrast into desired area was observed. No evidence of afferent or efferent intravascular uptake. Permanent images saved into the patient's record.  Antibiotic Prophylaxis:   Anti-infectives (From admission, onward)   None     Indication(s): None identified  Post-operative Assessment:  Post-procedure Vital Signs:  Pulse/HCG Rate: (!) 53(!) 58 Temp: (!) 97.3 F (36.3 C) Resp: 16 BP: (!) 162/94 SpO2: 98 %  EBL: None  Complications: No immediate post-treatment complications observed by team, or reported by patient.  Note: The patient tolerated the entire procedure well. A repeat set of vitals were taken after the procedure and the patient was kept under observation following institutional policy, for this type of procedure. Post-procedural neurological assessment was performed, showing return to baseline, prior to discharge. The patient was provided with post-procedure discharge instructions, including a section on how to identify potential problems. Should any problems arise concerning this procedure, the patient was given instructions to immediately contact us, at any time, without hesitation. In any case, we plan to contact the patient by telephone for a follow-up status report regarding this interventional procedure.  Comments:  No additional relevant information.  Plan of Care  Orders:  Orders Placed This Encounter  Procedures  . DG [facility] C-ARM 1-60 MIN NO REPORT    Intraoperative interpretation by procedural physician at [facility].    Standing Status:   Standing    Number of Occurrences:   1    Order  Specific Question:   Reason for exam:    Answer:  Assistance in needle guidance and placement for procedures requiring needle placement in or near specific anatomical locations not easily accessible without such assistance.   Medications ordered for procedure: Meds ordered this encounter  Medications  . lidocaine (XYLOCAINE) 2 % (with pres) injection 400 mg  . ropivacaine (PF) 2 mg/mL (0.2%) (NAROPIN) injection 9 mL  . methylPREDNISolone acetate (DEPO-MEDROL) injection 40 mg  . methylPREDNISolone acetate (DEPO-MEDROL) injection 40 mg  . iohexol (OMNIPAQUE) 180 MG/ML injection 10 mL   Medications administered: We administered lidocaine, ropivacaine (PF) 2 mg/mL (0.2%), methylPREDNISolone acetate, methylPREDNISolone acetate, and iohexol.  See the medical record for exact dosing, route, and time of administration.  Follow-up plan:   Return in about 4 weeks (around 05/18/2020) for Post Procedure Evaluation, virtual.      Status post left L5-S1 ESI #1 on 05/06/2019, and #2 on 06/17/2019 as well as left sacroiliac joint injection #1 on 06/17/2019: Right SI joint injection, right piriformis injection on 04/20/2020    Recent Visits Date Type Provider Dept  04/13/20 Office Visit Gillis Santa, MD [facility]  Showing recent visits within past 90 days and meeting all other requirements Today's Visits Date Type Provider Dept  04/20/20 Procedure visit Gillis Santa, MD [facility]  Showing today's visits and meeting all other requirements Future Appointments Date Type Provider Dept  05/26/20 Appointment Gillis Santa, MD [facility]  Showing future appointments within next 90 days and meeting all other requirements  Disposition: Discharge home  Discharge (Date  Time): 04/20/2020; 1052 (pt had no problems) hrs.   Primary Care Physician: Rusty Aus, MD Location: [facility] Note by: Gillis Santa, MD Date: 04/20/2020; Time: 12:04  PM  Disclaimer:  Medicine is not an exact science. The only guarantee in medicine is that nothing is guaranteed. It is important to note that the decision to proceed with this intervention was based on the information collected from the patient. The Data and conclusions were drawn from the patient's questionnaire, the interview, and the physical examination. Because the information was provided in large part by the patient, it cannot be guaranteed that it has not been purposely or unconsciously manipulated. Every effort has been made to obtain as much relevant data as possible for this evaluation. It is important to note that the conclusions that lead to this procedure are derived in large part from the available data. Always take into account that the treatment will also be dependent on availability of resources and existing treatment guidelines, considered by other Pain Management Practitioners as being common knowledge and practice, at the time of the intervention. For Medico-Legal purposes, it is also important to point out that variation in procedural techniques and pharmacological choices are the acceptable norm. The indications, contraindications, technique, and results of the above procedure should only be interpreted and judged by a Board-Certified Interventional Pain Specialist with extensive familiarity and expertise in the same exact procedure and technique.

## 2020-04-20 NOTE — Progress Notes (Signed)
Safety precautions to be maintained throughout the outpatient stay will include: orient to surroundings, keep bed in low position, maintain call bell within reach at all times, provide assistance with transfer out of bed and ambulation.  

## 2020-04-20 NOTE — Patient Instructions (Signed)
Pain Management Discharge Instructions  General Discharge Instructions :  If you need to reach your doctor call: Monday-Friday 8:00 am - 4:00 pm at [phone] or toll free [phone].  After clinic hours [phone] to have operator reach doctor.  Bring all of your medication bottles to all your appointments in the pain clinic.  To cancel or reschedule your appointment with Pain Management please remember to call 24 hours in advance to avoid a fee.  Refer to the educational materials which you have been given on: General Risks, I had my Procedure. Discharge Instructions, Post Sedation.  Post Procedure Instructions:  The drugs you were given will stay in your system until tomorrow, so for the next 24 hours you should not drive, make any legal decisions or drink any alcoholic beverages.  You may eat anything you prefer, but it is better to start with liquids then soups and crackers, and gradually work up to solid foods.  Please notify your doctor immediately if you have any unusual bleeding, trouble breathing or pain that is not related to your normal pain.  Depending on the type of procedure that was done, some parts of your body may feel week and/or numb.  This usually clears up by tonight or the next day.  Walk with the use of an assistive device or accompanied by an adult for the 24 hours.  You may use ice on the affected area for the first 24 hours.  Put ice in a Ziploc bag and cover with a towel and place against area 15 minutes on 15 minutes off.  You may switch to heat after 24 hours.Sacroiliac (SI) Joint Injection Patient Information  Description: The sacroiliac joint connects the scrum (very low back and tailbone) to the ilium (a pelvic bone which also forms half of the hip joint).  Normally this joint experiences very little motion.  When this joint becomes inflamed or unstable low back and or hip and pelvis pain may result.  Injection of this joint with local anesthetics  (numbing medicines) and steroids can provide diagnostic information and reduce pain.  This injection is performed with the aid of x-ray guidance into the tailbone area while you are lying on your stomach.   You may experience an electrical sensation down the leg while this is being done.  You may also experience numbness.  We also may ask if we are reproducing your normal pain during the injection.  Conditions which may be treated SI injection:  Low back, buttock, hip or leg pain  Preparation for the Injection:  Do not eat any solid food or dairy products within 8 hours of your appointment.  You may drink clear liquids up to 3 hours before appointment.  Clear liquids include water, black coffee, juice or soda.  No milk or cream please. You may take your regular medications, including pain medications with a sip of water before your appointment.  Diabetics should hold regular insulin (if take separately) and take 1/2 normal NPH dose the morning of the procedure.  Carry some sugar containing items with you to your appointment. A driver must accompany you and be prepared to drive you home after your procedure. Bring all of your current medications with you. An IV may be inserted and sedation may be given at the discretion of the physician. A blood pressure cuff, EKG and other monitors will often be applied during the procedure.  Some patients may need to have extra oxygen administered for a short period.  You will  be asked to provide medical information, including your allergies, prior to the procedure.  We must know immediately if you are taking blood thinners (like Coumadin/Warfarin) or if you are allergic to IV iodine contrast (dye).  We must know if you could possible be pregnant.  Possible side effects:  Bleeding from needle site Infection (rare, may require surgery) Nerve injury (rare) Numbness & tingling (temporary) A brief convulsion or seizure Light-headedness (temporary) Pain at  injection site (several days) Decreased blood pressure (temporary) Weakness in the leg (temporary)   Call if you experience:  New onset weakness or numbness of an extremity below the injection site that last more than 8 hours. Hives or difficulty breathing ( go to the emergency room) Inflammation or drainage at the injection site Any new symptoms which are concerning to you  Please note:  Although the local anesthetic injected can often make your back/ hip/ buttock/ leg feel good for several hours after the injections, the pain will likely return.  It takes 3-7 days for steroids to work in the sacroiliac area.  You may not notice any pain relief for at least that one week.  If effective, we will often do a series of three injections spaced 3-6 weeks apart to maximally decrease your pain.  After the initial series, we generally will wait some months before a repeat injection of the same type.  If you have any questions, please call [phone] [facility]  Trigger Point Injection Trigger points are areas where you have pain. A trigger point injection is a shot given in the trigger point to help relieve pain for a few days to a few months. Common places for trigger points include:  The neck.  The shoulders.  The upper back.  The lower back. A trigger point injection will not cure long-term (chronic) pain permanently. These injections do not always work for every person. For some people, they can help to relieve pain for a few days to a few months. Tell a health care provider about:  Any allergies you have.  All medicines you are taking, including vitamins, herbs, eye drops, creams, and over-the-counter medicines.  Any problems you or family members have had with anesthetic medicines.  Any blood disorders you have.  Any surgeries you have had.  Any medical conditions you have. What are the risks? Generally, this is a safe procedure.  However, problems may occur, including:  Infection.  Bleeding or bruising.  Allergic reaction to the injected medicine.  Irritation of the skin around the injection site. What happens before the procedure? Ask your health care provider about:  Changing or stopping your regular medicines. This is especially important if you are taking diabetes medicines or blood thinners.  Taking medicines such as aspirin and ibuprofen. These medicines can thin your blood. Do not take these medicines unless your health care provider tells you to take them.  Taking over-the-counter medicines, vitamins, herbs, and supplements. What happens during the procedure?   Your health care provider will feel for trigger points. A marker may be used to circle the area for the injection.  The skin over the trigger point will be washed with a germ-killing (antiseptic) solution.  A thin needle is used for the injection. You may feel pain or a twitching feeling when the needle enters the trigger point.  A numbing solution may be injected into the trigger point. Sometimes a medicine to keep down inflammation is also injected.  Your health care  provider may move the needle around the area where the trigger point is located until the tightness and twitching goes away.  After the injection, your health care provider may put gentle pressure over the injection site.  The injection site will be covered with a bandage (dressing). The procedure may vary among health care providers and hospitals. What can I expect after treatment? After treatment, you may have:  Soreness and stiffness for 1-2 days.  A dressing. This can be taken off in a few hours or as told by your health care provider. Follow these instructions at home: Injection site care  Remove your dressing as told by your health care provider.  Check your injection site every day for signs of infection. Check for: ? Redness, swelling, or pain. ? Fluid or  blood. ? Warmth. ? Pus or a bad smell. Managing pain, stiffness, and swelling  If directed, put ice on the affected area. ? Put ice in a plastic bag. ? Place a towel between your skin and the bag. ? Leave the ice on for 20 minutes, 2-3 times a day. General instructions  If you were asked to stop your regular medicines, ask your health care provider when you may start taking them again.  Return to your normal activities as told by your health care provider. Ask your health care provider what activities are safe for you.  Do not take baths, swim, or use a hot tub until your health care provider approves.  You may be asked to see an occupational or physical therapist for exercises that reduce muscle strain and stretch the area of the trigger point.  Keep all follow-up visits as told by your health care provider. This is important. Contact a health care provider if:  Your pain comes back, and it is worse than before the injection. You may need more injections.  You have chills or a fever.  The injection site becomes more painful, red, swollen, or warm to the touch. Summary  A trigger point injection is a shot given in the trigger point to help relieve pain for a few days to a few months.  Common places for trigger point injections are the neck, shoulder, upper back, and lower back.  These injections do not always work for every person, but for some people, the injections can help to relieve pain for a few days to a few months.  Contact a health care provider if symptoms come back or they are worse than before treatment. Also, get help if the injection site becomes more painful, red, swollen, or warm to the touch. This information is not intended to replace advice given to you by your health care provider. Make sure you discuss any questions you have with your health care provider. Document Revised: 11/26/2018 Document Reviewed: 11/26/2018 Elsevier Patient Education  [place].

## 2020-05-12 DIAGNOSIS — R972 Elevated prostate specific antigen [PSA]: Secondary | ICD-10-CM | POA: Insufficient documentation

## 2020-05-25 ENCOUNTER — Encounter: Payer: Self-pay | Admitting: Student in an Organized Health Care Education/Training Program

## 2020-05-26 ENCOUNTER — Ambulatory Visit
Payer: Medicare Other | Attending: Student in an Organized Health Care Education/Training Program | Admitting: Student in an Organized Health Care Education/Training Program

## 2020-05-26 ENCOUNTER — Encounter: Payer: Self-pay | Admitting: Student in an Organized Health Care Education/Training Program

## 2020-05-26 ENCOUNTER — Other Ambulatory Visit: Payer: Self-pay

## 2020-05-26 DIAGNOSIS — G5702 Lesion of sciatic nerve, left lower limb: Secondary | ICD-10-CM

## 2020-05-26 DIAGNOSIS — M249 Joint derangement, unspecified: Secondary | ICD-10-CM

## 2020-05-26 DIAGNOSIS — G894 Chronic pain syndrome: Secondary | ICD-10-CM

## 2020-05-26 DIAGNOSIS — M533 Sacrococcygeal disorders, not elsewhere classified: Secondary | ICD-10-CM | POA: Diagnosis not present

## 2020-05-26 DIAGNOSIS — G5701 Lesion of sciatic nerve, right lower limb: Secondary | ICD-10-CM

## 2020-05-26 DIAGNOSIS — G8929 Other chronic pain: Secondary | ICD-10-CM

## 2020-05-26 NOTE — Progress Notes (Signed)
Patient: Ricardo Winters.  Service Category: E/M  Provider: Gillis Santa, MD  DOB: [birth year]  DOS: 05/26/2020  Location: Office  MRN: [id number]  Setting: Ambulatory outpatient  Referring Provider: Rusty Aus, MD  Type: Established Patient  Specialty: Interventional Pain Management  PCP: Ricardo Aus, MD  Location: Home  Delivery: TeleHealth     Virtual Encounter - Pain Management PROVIDER NOTE: Information contained herein reflects review and annotations entered in association with encounter. Interpretation of such information and data should be left to medically-trained personnel. Information provided to patient can be located elsewhere in the medical record under "Patient Instructions". Document created using STT-dictation technology, any transcriptional errors that may result from process are unintentional.    Contact & Pharmacy Preferred: [phone] Home: [phone] (home) Mobile: [phone] (mobile) E-mail: [email or web address]  [facility] [place], [address] [place] [address] Phone: [phone] Fax: [phone]   Pre-screening  Ricardo Winters offered "in-person" vs "virtual" encounter. He indicated preferring virtual for this encounter.   Reason COVID-19*  Social distancing based on CDC and AMA recommendations.   I contacted Ricardo Winters. on 05/26/2020 via video conference.      I clearly identified myself as Gillis Santa, MD. I verified that I was speaking with the correct person using two identifiers (Name: Ricardo Bamber., and date of birth: 02/09/1946).  Consent I sought verbal advanced consent from Ricardo Winters. for virtual visit interactions. I informed Ricardo Winters of possible security and privacy concerns, risks, and limitations associated with providing "not-in-person" medical evaluation and management services. I also informed Ricardo Winters of the availability of  "in-person" appointments. Finally, I informed him that there would be a charge for the virtual visit and that he could be  personally, fully or partially, financially responsible for it. Ricardo Winters expressed understanding and agreed to proceed.   Historic Elements   Ricardo Winters. is a 74 y.o. year old, male patient evaluated today after his last contact with our practice on 04/20/2020. Ricardo Winters  has a past medical history of Anginal pain ([place]), Cancer [facility]), Coronary artery disease, COVID-19, Edema, Hypertension, Myocardial infarction ([place]), and Obesity. He also  has a past surgical history that includes Shoulder acromioplasty and Colonoscopy with propofol (N/A, 12/12/2015). Ricardo Winters has a current medication list which includes the following prescription(s): amoxicillin, vitamin c, aspirin, black pepper-turmeric, vitamin d3, collagen, grape seed, losartan, nitroglycerin, NON FORMULARY, omega-3, pravastatin, sildenafil, tamsulosin, vitamin b-12, zinc sulfate, amlodipine, coq10, and turmeric. He  reports that he has never smoked. He has never used smokeless tobacco. He reports current alcohol use of about 4.0 standard drinks of alcohol per week. He reports that he does not use drugs. Ricardo Winters has No Known Allergies.   HPI  Today, he is being contacted for a post-procedure assessment.   Post-Procedure Evaluation  Procedure (04/20/2020):   Type: Diagnostic RIGHT Sacroiliac Joint Steroid Injection #1 & RIGHT Piriformis TPI/MNB Region: Inferior Lumbosacral Region Level: PIIS (Posterior Inferior Iliac Spine) Laterality: Right   Sedation: Please see nurses note.  Effectiveness during initial hour after procedure(Ultra-Short Term Relief): 90 %   Local anesthetic used: Long-acting (4-6 hours) Effectiveness: Defined as any analgesic benefit obtained secondary to the administration of local anesthetics. This carries significant diagnostic value as to the etiological location, or anatomical  origin, of the pain. Duration of benefit is expected  to coincide with the duration of the local anesthetic used.  Effectiveness during initial 4-6 hours after procedure(Short-Term Relief): 90 %   Long-term benefit: Defined as any relief past the pharmacologic duration of the local anesthetics.  Effectiveness past the initial 6 hours after procedure(Long-Term Relief): 90 %   Current benefits: Defined as benefit that persist at this time.   Analgesia:  90-100% better Function: Ricardo Winters reports improvement in function ROM: Ricardo Winters reports improvement in ROM   Is now having similar pain on the left side and would like to have procedure now done to the left side as well.   Laboratory Chemistry Profile   Renal Lab Results  Component Value Date   BUN 20 02/21/2020   CREATININE 1.09 02/21/2020   GFRAA >60 02/21/2020   GFRNONAA >60 02/21/2020     Hepatic Lab Results  Component Value Date   AST 18 03/10/2018   ALT 16 (L) 03/10/2018   ALBUMIN 3.2 (L) 03/10/2018   ALKPHOS 57 03/10/2018     Electrolytes Lab Results  Component Value Date   NA 143 02/21/2020   K 4.0 02/21/2020   CL 108 02/21/2020   CALCIUM 9.3 02/21/2020     Bone No results found for: VD25OH, VD125OH2TOT, JW1191YN8, GN5621HY8, 25OHVITD1, 25OHVITD2, 25OHVITD3, TESTOFREE, TESTOSTERONE   Inflammation (CRP: Acute Phase) (ESR: Chronic Phase) No results found for: CRP, ESRSEDRATE, LATICACIDVEN     Note: Above Lab results reviewed.    Lumbar Spine Area Exam   Sensory (Neurological): Musculoskeletal pain pattern  Provocative Tests:  Patrick's Maneuver: (+) for left-sided S-I arthralgia             FABER* test: (+) for left-sided S-I arthralgia             S-I anterior distraction/compression test: deferred today         S-I lateral compression test: deferred today         S-I Thigh-thrust test: deferred today         S-I Gaenslen's test: deferred today         *(Flexion, ABduction and External  Rotation)  Assessment  The primary encounter diagnosis was Chronic left SI joint pain. Diagnoses of Sacroiliac joint dysfunction of left side, Piriformis syndrome, left, Chronic right SI joint pain, Derangement of right SI joint, Piriformis syndrome, right, and Chronic pain syndrome were also pertinent to this visit.  Plan of Care   Patient has experienced significant pain relief and improvement in functional status after his right sacroiliac joint injection and right piriformis injection under fluoroscopy.  He states that he is able to ambulate and less pain and has less pain when he plays golf.  He is having similar pain albeit less intense on his left side and would like to try left sacroiliac joint and left piriformis injection which is reasonable.  Patient continues home stretching exercises and myofascial release therapy with a massaging device that he has.  Orders:  Orders Placed This Encounter  Procedures  . SACROILIAC JOINT INJECTION    Standing Status:   Future    Standing Expiration Date:   06/26/2020    Scheduling Instructions:     Side: Left     Sedation: Without     Timeframe: ASAP    Order Specific Question:   Where will this procedure be performed?    Answer:   [facility]  . TRIGGER POINT INJECTION    Standing Status:   Future    Standing Expiration Date:  08/26/2020    Scheduling Instructions:     Left piriformis TPI    Order Specific Question:   Where will this procedure be performed?    Answer:   [facility]   Follow-up plan:   Return in about 2 weeks (around 06/09/2020) for Left SIJ + Left Piriformis TPI , without sedation.     Status post left L5-S1 ESI #1 on 05/06/2019, and #2 on 06/17/2019 as well as left sacroiliac joint injection #1 on 06/17/2019: Right SI joint injection, right piriformis injection on 04/20/2020 helped significantly.  Now having similar pain on the left side and would like to try left SI joint injection and left piriformis there.      Recent Visits Date Type Provider Dept  04/20/20 Procedure visit Gillis Santa, MD [facility]  04/13/20 Office Visit Gillis Santa, MD [facility]  Showing recent visits within past 90 days and meeting all other requirements Today's Visits Date Type Provider Dept  05/26/20 Telemedicine Gillis Santa, MD [facility]  Showing today's visits and meeting all other requirements Future Appointments No visits were found meeting these conditions. Showing future appointments within next 90 days and meeting all other requirements  I discussed the assessment and treatment plan with the patient. The patient was provided an opportunity to ask questions and all were answered. The patient agreed with the plan and demonstrated an understanding of the instructions.  Patient advised to call back or seek an in-person evaluation if the symptoms or condition worsens.  Duration of encounter:30 minutes.  Note by: Gillis Santa, MD Date: 05/26/2020; Time: 3:28 PM

## 2020-05-30 ENCOUNTER — Ambulatory Visit
Admission: RE | Admit: 2020-05-30 | Discharge: 2020-05-30 | Disposition: A | Discharge status: Home / Self Care | Payer: Medicare Other | Source: Ambulatory Visit | Attending: Student in an Organized Health Care Education/Training Program | Admitting: Student in an Organized Health Care Education/Training Program

## 2020-05-30 ENCOUNTER — Encounter: Payer: Self-pay | Admitting: Student in an Organized Health Care Education/Training Program

## 2020-05-30 ENCOUNTER — Ambulatory Visit (HOSPITAL_BASED_OUTPATIENT_CLINIC_OR_DEPARTMENT_OTHER): Payer: Medicare Other | Admitting: Student in an Organized Health Care Education/Training Program

## 2020-05-30 ENCOUNTER — Other Ambulatory Visit: Payer: Self-pay

## 2020-05-30 VITALS — BP 160/73 | HR 55 | Temp 97.2°F | Resp 17 | Ht 72.0 in | Wt 224.0 lb

## 2020-05-30 DIAGNOSIS — G894 Chronic pain syndrome: Secondary | ICD-10-CM | POA: Insufficient documentation

## 2020-05-30 DIAGNOSIS — G5702 Lesion of sciatic nerve, left lower limb: Secondary | ICD-10-CM | POA: Diagnosis not present

## 2020-05-30 DIAGNOSIS — Z79899 Other long term (current) drug therapy: Secondary | ICD-10-CM | POA: Diagnosis not present

## 2020-05-30 DIAGNOSIS — Z7982 Long term (current) use of aspirin: Secondary | ICD-10-CM | POA: Insufficient documentation

## 2020-05-30 DIAGNOSIS — G8929 Other chronic pain: Secondary | ICD-10-CM

## 2020-05-30 DIAGNOSIS — M533 Sacrococcygeal disorders, not elsewhere classified: Secondary | ICD-10-CM

## 2020-05-30 MED ORDER — IOHEXOL 180 MG/ML  SOLN
INTRAMUSCULAR | Status: AC
  Filled 2020-05-30: qty 20

## 2020-05-30 MED ORDER — METHYLPREDNISOLONE ACETATE 40 MG/ML IJ SUSP
40.0000 mg | Freq: Once | INTRAMUSCULAR | Status: AC
  Administered 2020-05-30: 40 mg via INTRA_ARTICULAR

## 2020-05-30 MED ORDER — IOHEXOL 180 MG/ML  SOLN
10.0000 mL | Freq: Once | INTRAMUSCULAR | Status: AC
  Administered 2020-05-30: 10 mL via INTRA_ARTICULAR

## 2020-05-30 MED ORDER — ROPIVACAINE HCL 2 MG/ML IJ SOLN
9.0000 mL | Freq: Once | INTRAMUSCULAR | Status: AC
  Administered 2020-05-30: 9 mL via PERINEURAL

## 2020-05-30 MED ORDER — METHYLPREDNISOLONE ACETATE 40 MG/ML IJ SUSP
40.0000 mg | Freq: Once | INTRAMUSCULAR | Status: AC
  Administered 2020-05-30: 40 mg via INTRA_ARTICULAR
  Filled 2020-05-30: qty 1

## 2020-05-30 MED ORDER — METHYLPREDNISOLONE ACETATE 40 MG/ML IJ SUSP
INTRAMUSCULAR | Status: AC
  Filled 2020-05-30: qty 1

## 2020-05-30 MED ORDER — ROPIVACAINE HCL 2 MG/ML IJ SOLN
INTRAMUSCULAR | Status: AC
  Filled 2020-05-30: qty 10

## 2020-05-30 MED ORDER — LIDOCAINE HCL 2 % IJ SOLN
20.0000 mL | Freq: Once | INTRAMUSCULAR | Status: AC
  Administered 2020-05-30: 400 mg

## 2020-05-30 NOTE — Progress Notes (Signed)
Safety precautions to be maintained throughout the outpatient stay will include: orient to surroundings, keep bed in low position, maintain call bell within reach at all times, provide assistance with transfer out of bed and ambulation.  

## 2020-05-30 NOTE — Patient Instructions (Signed)

## 2020-05-30 NOTE — Progress Notes (Signed)
PROVIDER NOTE: Information contained herein reflects review and annotations entered in association with encounter. Interpretation of such information and data should be left to medically-trained personnel. Information provided to patient can be located elsewhere in the medical record under "Patient Instructions". Document created using STT-dictation technology, any transcriptional errors that may result from process are unintentional.    Patient: Ricardo Winters.  Service Category: Procedure  Provider: Gillis Santa, MD  DOB: 15-Mar-1946  DOS: 05/30/2020  Location: [facility]  MRN: [id number]  Setting: Ambulatory - outpatient  Referring Provider: Rusty Aus, MD  Type: Established Patient  Specialty: Interventional Pain Management  PCP: Rusty Aus, MD   Primary Reason for Visit: Interventional Pain Management Treatment. CC: Back Pain (left SI) and Hip Pain (left pyriformis)  Procedure:          Anesthesia, Analgesia, Anxiolysis:  Type: Diagnostic LEFT Sacroiliac Joint Steroid Injection #1 & LEFT Piriformis TPI/MNB Region: Inferior Lumbosacral Region Level: PIIS (Posterior Inferior Iliac Spine) Laterality: Left  Type: Local Anesthesia  Local Anesthetic: Lidocaine 1-2%  Position: Prone           Indications: 1. Chronic left SI joint pain   2. Sacroiliac joint dysfunction of left side   3. Piriformis syndrome, left   4. Chronic pain syndrome    Pain Score: Pre-procedure: 2 /10 Post-procedure: 2 /10   Pre-op Assessment:  Ricardo Winters is a 74 y.o. (year old), male patient, seen today for interventional treatment. He  has a past surgical history that includes Shoulder acromioplasty and Colonoscopy with propofol (N/A, 12/12/2015). Ricardo Winters has a current medication list which includes the following prescription(s): vitamin c, aspirin, black pepper-turmeric, vitamin d3, collagen, grape seed, losartan, nitroglycerin, omega-3, pravastatin, sildenafil, tamsulosin, vitamin  b-12, zinc sulfate, amlodipine, amoxicillin, coq10, NON FORMULARY, and turmeric. His primarily concern today is the Back Pain (left SI) and Hip Pain (left pyriformis)  Initial Vital Signs:  Pulse/HCG Rate: 55  Temp: (!) 97.2 F (36.2 C) Resp: 16 BP: (!) 165/85 SpO2: 99 %  BMI: Estimated body mass index is 30.38 kg/m as calculated from the following:   Height as of this encounter: 6' (1.829 m).   Weight as of this encounter: 224 lb (101.6 kg).  Risk Assessment: Allergies: Reviewed. He has No Known Allergies.  Allergy Precautions: None required Coagulopathies: Reviewed. None identified.  Blood-thinner therapy: None at this time Active Infection(s): Reviewed. None identified. Ricardo Winters is afebrile  Site Confirmation: Ricardo Winters was asked to confirm the procedure and laterality before marking the site Procedure checklist: Completed Consent: Before the procedure and under the influence of no sedative(s), amnesic(s), or anxiolytics, the patient was informed of the treatment options, risks and possible complications. To fulfill our ethical and legal obligations, as recommended by the American Medical Association's Code of Ethics, I have informed the patient of my clinical impression; the nature and purpose of the treatment or procedure; the risks, benefits, and possible complications of the intervention; the alternatives, including doing nothing; the risk(s) and benefit(s) of the alternative treatment(s) or procedure(s); and the risk(s) and benefit(s) of doing nothing. The patient was provided information about the general risks and possible complications associated with the procedure. These may include, but are not limited to: failure to achieve desired goals, infection, bleeding, organ or nerve damage, allergic reactions, paralysis, and death. In addition, the patient was informed of those risks and complications associated to the procedure, such as failure to decrease pain; infection;  bleeding; organ or nerve  damage with subsequent damage to sensory, motor, and/or autonomic systems, resulting in permanent pain, numbness, and/or weakness of one or several areas of the body; allergic reactions; (i.e.: anaphylactic reaction); and/or death. Furthermore, the patient was informed of those risks and complications associated with the medications. These include, but are not limited to: allergic reactions (i.e.: anaphylactic or anaphylactoid reaction(s)); adrenal axis suppression; blood sugar elevation that in diabetics may result in ketoacidosis or comma; water retention that in patients with history of congestive heart failure may result in shortness of breath, pulmonary edema, and decompensation with resultant heart failure; weight gain; swelling or edema; medication-induced neural toxicity; particulate matter embolism and blood vessel occlusion with resultant organ, and/or nervous system infarction; and/or aseptic necrosis of one or more joints. Finally, the patient was informed that Medicine is not an exact science; therefore, there is also the possibility of unforeseen or unpredictable risks and/or possible complications that may result in a catastrophic outcome. The patient indicated having understood very clearly. We have given the patient no guarantees and we have made no promises. Enough time was given to the patient to ask questions, all of which were answered to the patient's satisfaction. Ricardo Winters has indicated that he wanted to continue with the procedure. Attestation: I, the ordering provider, attest that I have discussed with the patient the benefits, risks, side-effects, alternatives, likelihood of achieving goals, and potential problems during recovery for the procedure that I have provided informed consent. Date  Time: 05/30/2020 10:46 AM  Pre-Procedure Preparation:  Monitoring: As per clinic protocol. Respiration, ETCO2, SpO2, BP, heart rate and rhythm monitor placed and checked  for adequate function Safety Precautions: Patient was assessed for positional comfort and pressure points before starting the procedure. Time-out: I initiated and conducted the "Time-out" before starting the procedure, as per protocol. The patient was asked to participate by confirming the accuracy of the "Time Out" information. Verification of the correct person, site, and procedure were performed and confirmed by me, the nursing staff, and the patient. "Time-out" conducted as per Joint Commission's Universal Protocol (UP.01.01.01). Time: 1112  Description of Procedure:          Target Area: Inferior, posterior, aspect of the sacroiliac fissure Approach: Posterior, paraspinal, ipsilateral approach. Area Prepped: Entire Lower Lumbosacral Region DuraPrep (Iodine Povacrylex [0.7% available iodine] and Isopropyl Alcohol, 74% w/w) Safety Precautions: Aspiration looking for blood return was conducted prior to all injections. At no point did we inject any substances, as a needle was being advanced. No attempts were made at seeking any paresthesias. Safe injection practices and needle disposal techniques used. Medications properly checked for expiration dates. SDV (single dose vial) medications used. Description of the Procedure: Protocol guidelines were followed. The patient was placed in position over the procedure table. The target area was identified and the area prepped in the usual manner. Skin & deeper tissues infiltrated with local anesthetic. Appropriate amount of time allowed to pass for local anesthetics to take effect. The procedure needle was advanced under fluoroscopic guidance into the sacroiliac joint until a firm endpoint was obtained. Proper needle placement secured. Negative aspiration confirmed. Solution injected in intermittent fashion, asking for systemic symptoms every 0.5cc of injectate. The needles were then removed and the area cleansed, making sure to leave some of the prepping  solution back to take advantage of its long term bactericidal properties. Vitals:   05/30/20 1050 05/30/20 1105 05/30/20 1115  BP: (!) 165/85 (!) 172/76 (!) 160/73  Pulse: 55 53 (!) 55  Resp: 16  15 17  Temp: (!) 97.2 F (36.2 C)    TempSrc: Temporal    SpO2: 99% 97% 96%  Weight: (!) 224 lb (101.6 kg)    Height: 6' (1.829 m)      Start Time: 1112 hrs. End Time: 1117 hrs. Materials:  Needle(s) Type: Spinal Needle Gauge: 25G Length: 3.5-in Medication(s): Please see orders for medications and dosing details. 4 cc solution made of 3 cc of 0.2% ropivacaine, 1 cc of methylprednisolone, 40 mg/cc.  4 cc injected into the LEFT SI joint.   LEFT piriformis injection also performed.  From the inferior border of the sacroiliac joint, needle was advanced 1 cm medial, 1 cm 1 cm inferior and 1 cm deep to the inferior pole of the SI joint.  Contrast was injected which showed it spreading into the piriformis muscle bed.  5 cc solution made of 4 cc of 0.2% ropivacaine, 1 cc of methylprednisolone, 40 mg/cc injected into right piriformis muscle.  Imaging Guidance (Non-Spinal):          Type of Imaging Technique: Fluoroscopy Guidance (Non-Spinal) Indication(s): Assistance in needle guidance and placement for procedures requiring needle placement in or near specific anatomical locations not easily accessible without such assistance. Exposure Time: Please see nurses notes. Contrast: Before injecting any contrast, we confirmed that the patient did not have an allergy to iodine, shellfish, or radiological contrast. Once satisfactory needle placement was completed at the desired level, radiological contrast was injected. Contrast injected under live fluoroscopy. No contrast complications. See chart for type and volume of contrast used. Fluoroscopic Guidance: I was personally present during the use of fluoroscopy. "Tunnel Vision Technique" used to obtain the best possible view of the target area. Parallax error  corrected before commencing the procedure. "Direction-depth-direction" technique used to introduce the needle under continuous pulsed fluoroscopy. Once target was reached, antero-posterior, oblique, and lateral fluoroscopic projection used confirm needle placement in all planes. Images permanently stored in EMR. Interpretation: I personally interpreted the imaging intraoperatively. Adequate needle placement confirmed in multiple planes. Appropriate spread of contrast into desired area was observed. No evidence of afferent or efferent intravascular uptake. Permanent images saved into the patient's record.  Antibiotic Prophylaxis:   Anti-infectives (From admission, onward)   None     Indication(s): None identified  Post-operative Assessment:  Post-procedure Vital Signs:  Pulse/HCG Rate: (!) 55 (SB)  Temp: (!) 97.2 F (36.2 C) Resp: 17 BP: (!) 160/73 SpO2: 96 %  EBL: None  Complications: No immediate post-treatment complications observed by team, or reported by patient.  Note: The patient tolerated the entire procedure well. A repeat set of vitals were taken after the procedure and the patient was kept under observation following institutional policy, for this type of procedure. Post-procedural neurological assessment was performed, showing return to baseline, prior to discharge. The patient was provided with post-procedure discharge instructions, including a section on how to identify potential problems. Should any problems arise concerning this procedure, the patient was given instructions to immediately contact us, at any time, without hesitation. In any case, we plan to contact the patient by telephone for a follow-up status report regarding this interventional procedure.  Comments:  No additional relevant information.  Plan of Care  Orders:  Orders Placed This Encounter  Procedures  . DG PA[facility] C-ARM 1-60 MIN NO REPORT    Intraoperative interpretation by procedural physician  at [place].    Standing Status:   Standing    Number of Occurrences:   1  Order Specific Question:   Reason for exam:    Answer:   Assistance in needle guidance and placement for procedures requiring needle placement in or near specific anatomical locations not easily accessible without such assistance.   Medications ordered for procedure: Meds ordered this encounter  Medications  . iohexol (OMNIPAQUE) 180 MG/ML injection 10 mL    Must be Myelogram-compatible. If not available, you may substitute with a water-soluble, non-ionic, hypoallergenic, myelogram-compatible radiological contrast medium.  Marland Kitchen lidocaine (XYLOCAINE) 2 % (with pres) injection 400 mg  . ropivacaine (PF) 2 mg/mL (0.2%) (NAROPIN) injection 9 mL  . methylPREDNISolone acetate (DEPO-MEDROL) injection 40 mg  . methylPREDNISolone acetate (DEPO-MEDROL) injection 40 mg   Medications administered: We administered iohexol, lidocaine, ropivacaine (PF) 2 mg/mL (0.2%), methylPREDNISolone acetate, and methylPREDNISolone acetate.  See the medical record for exact dosing, route, and time of administration.  Follow-up plan:   Return in about 5 weeks (around 07/04/2020) for Post Procedure Evaluation, virtual.      Status post left L5-S1 ESI #1 on 05/06/2019, and #2 on 06/17/2019 as well as left sacroiliac joint injection #1 on 06/17/2019: Right SI joint injection, right piriformis injection on 04/20/2020-helped significantly; LEFT SI joint injection, LEFT piriformis injection on 05/30/20    Recent Visits Date Type Provider Dept  05/26/20 Telemedicine Gillis Santa, MD [facility]  04/20/20 Procedure visit Gillis Santa, MD [facility]  04/13/20 Office Visit Gillis Santa, MD [facility]  Showing recent visits within past 90 days and meeting all other requirements Today's Visits Date Type Provider Dept  05/30/20 Procedure visit Gillis Santa, MD [facility]  Showing today's visits and  meeting all other requirements Future Appointments Date Type Provider Dept  07/06/20 Appointment Gillis Santa, MD [facility]  Showing future appointments within next 90 days and meeting all other requirements  Disposition: Discharge home  Discharge (Date  Time): 05/30/2020;   hrs.   Primary Care Physician: Rusty Aus, MD Location: [facility] Note by: Gillis Santa, MD Date: 05/30/2020; Time: 12:00 PM  Disclaimer:  Medicine is not an exact science. The only guarantee in medicine is that nothing is guaranteed. It is important to note that the decision to proceed with this intervention was based on the information collected from the patient. The Data and conclusions were drawn from the patient's questionnaire, the interview, and the physical examination. Because the information was provided in large part by the patient, it cannot be guaranteed that it has not been purposely or unconsciously manipulated. Every effort has been made to obtain as much relevant data as possible for this evaluation. It is important to note that the conclusions that lead to this procedure are derived in large part from the available data. Always take into account that the treatment will also be dependent on availability of resources and existing treatment guidelines, considered by other Pain Management Practitioners as being common knowledge and practice, at the time of the intervention. For Medico-Legal purposes, it is also important to point out that variation in procedural techniques and pharmacological choices are the acceptable norm. The indications, contraindications, technique, and results of the above procedure should only be interpreted and judged by a Board-Certified Interventional Pain Specialist with extensive familiarity and expertise in the same exact procedure and technique.

## 2020-05-31 ENCOUNTER — Telehealth: Payer: Self-pay

## 2020-05-31 NOTE — Telephone Encounter (Signed)
Post procedure phone call. Patient states he is doing well.  

## 2020-07-05 ENCOUNTER — Telehealth: Payer: Self-pay

## 2020-07-05 NOTE — Telephone Encounter (Signed)
Pt was called and message was left on answering service. 

## 2020-07-06 ENCOUNTER — Other Ambulatory Visit: Payer: Self-pay

## 2020-07-06 ENCOUNTER — Encounter: Payer: Self-pay | Admitting: Student in an Organized Health Care Education/Training Program

## 2020-07-06 ENCOUNTER — Ambulatory Visit
Payer: Medicare Other | Attending: Student in an Organized Health Care Education/Training Program | Admitting: Student in an Organized Health Care Education/Training Program

## 2020-07-06 DIAGNOSIS — M533 Sacrococcygeal disorders, not elsewhere classified: Secondary | ICD-10-CM | POA: Diagnosis not present

## 2020-07-06 DIAGNOSIS — G8929 Other chronic pain: Secondary | ICD-10-CM

## 2020-07-06 DIAGNOSIS — M47816 Spondylosis without myelopathy or radiculopathy, lumbar region: Secondary | ICD-10-CM | POA: Insufficient documentation

## 2020-07-06 DIAGNOSIS — G5702 Lesion of sciatic nerve, left lower limb: Secondary | ICD-10-CM | POA: Diagnosis not present

## 2020-07-06 DIAGNOSIS — G894 Chronic pain syndrome: Secondary | ICD-10-CM

## 2020-07-06 NOTE — Progress Notes (Signed)
Patient: Ricardo Winters.  Service Category: E/M  Provider: Gillis Santa, MD  DOB: [birth year]  DOS: 07/06/2020  Location: Office  MRN: [id number]  Setting: Ambulatory outpatient  Referring Provider: Rusty Aus, MD  Type: Established Patient  Specialty: Interventional Pain Management  PCP: Ricardo Aus, MD  Location: Home  Delivery: TeleHealth     Virtual Encounter - Pain Management PROVIDER NOTE: Information contained herein reflects review and annotations entered in association with encounter. Interpretation of such information and data should be left to medically-trained personnel. Information provided to patient can be located elsewhere in the medical record under "Patient Instructions". Document created using STT-dictation technology, any transcriptional errors that may result from process are unintentional.    Contact & Pharmacy Preferred: [phone] Home: [phone] (home) Mobile: [phone] (mobile) E-mail: [email or web address] [address] [place] [address] Phone: [phone] Fax: [phone]   Pre-screening  Ricardo Winters offered "in-person" vs "virtual" encounter. He indicated preferring virtual for this encounter.   Reason COVID-19*  Social distancing based on CDC and AMA recommendations.   I contacted Ricardo Winters. on 07/06/2020 via video conference.      I clearly identified myself as Gillis Santa, MD. I verified that I was speaking with the correct person using two identifiers (Name: Ricardo Winters., and date of birth: 1946/04/27).  Consent I sought verbal advanced consent from Ricardo Winters. for virtual visit interactions. I informed Ricardo Winters of possible security and privacy concerns, risks, and limitations associated with providing "not-in-person" medical evaluation and management services. I also informed Ricardo Winters of the availability of  "in-person" appointments. Finally, I informed him that there would be a charge for the virtual visit and that he could be  personally, fully or partially, financially responsible for it. Ricardo Winters expressed understanding and agreed to proceed.   Historic Elements   Ricardo Winters. is a 74 y.o. year old, male patient evaluated today after his last contact with our practice on 07/05/2020. Ricardo Winters  has a past medical history of Anginal pain ([place]), Cancer [facility]), Coronary artery disease, COVID-19, Edema, Hypertension, Myocardial infarction ([place]), and Obesity. He also  has a past surgical history that includes Shoulder acromioplasty and Colonoscopy with propofol (N/A, 12/12/2015). Ricardo Winters has a current medication list which includes the following prescription(s): amoxicillin, vitamin c, aspirin, black pepper-turmeric, vitamin d3, coq10, collagen, grape seed, losartan, nitroglycerin, NON FORMULARY, omega-3, pravastatin, sildenafil, tamsulosin, turmeric, vitamin b-12, zinc sulfate, and amlodipine. He  reports that he has never smoked. He has never used smokeless tobacco. He reports current alcohol use of about 4.0 standard drinks of alcohol per week. He reports that he does not use drugs. [name] has No Known Allergies.   HPI  Today, he is being contacted for a post-procedure assessment.   Post-Procedure Evaluation  Procedure (05/30/2020):   Type: Diagnostic LEFT Sacroiliac Joint Steroid Injection #1 & LEFT Piriformis TPI/MNB Region: Inferior Lumbosacral Region Level: PIIS (Posterior Inferior Iliac Spine) Laterality: Left  Sedation: Please see nurses note.  Effectiveness during initial hour after procedure(Ultra-Short Term Relief): 100 %   Local anesthetic used: Long-acting (4-6 hours) Effectiveness: Defined as any analgesic benefit obtained secondary to the administration of local anesthetics. This carries significant diagnostic value as to the etiological location, or anatomical origin,  of the pain. Duration of benefit is expected to  coincide with the duration of the local anesthetic used.  Effectiveness during initial 4-6 hours after procedure(Short-Term Relief): 100 %   Long-term benefit: Defined as any relief past the pharmacologic duration of the local anesthetics.  Effectiveness past the initial 6 hours after procedure(Long-Term Relief): 100 %   Current benefits: Defined as benefit that persist at this time.   Analgesia:  >75% relief Function: Ricardo Winters reports improvement in function ROM: Ricardo Winters reports improvement in ROM   Laboratory Chemistry Profile   Renal Lab Results  Component Value Date   BUN 20 02/21/2020   CREATININE 1.09 02/21/2020   GFRAA >60 02/21/2020   GFRNONAA >60 02/21/2020     Hepatic Lab Results  Component Value Date   AST 18 03/10/2018   ALT 16 (L) 03/10/2018   ALBUMIN 3.2 (L) 03/10/2018   ALKPHOS 57 03/10/2018     Electrolytes Lab Results  Component Value Date   NA 143 02/21/2020   K 4.0 02/21/2020   CL 108 02/21/2020   CALCIUM 9.3 02/21/2020     Bone No results found for: VD25OH, VD125OH2TOT, VE7209OB0, JG2836OQ9, 25OHVITD1, 25OHVITD2, 25OHVITD3, TESTOFREE, TESTOSTERONE   Inflammation (CRP: Acute Phase) (ESR: Chronic Phase) No results found for: CRP, ESRSEDRATE, LATICACIDVEN     Note:   Imaging Lumbar MRI 04/06/2019 Relatively short pedicles are present throughout the lumbar spine.  L1-2: Negative.  L2-3: Mild facet hypertrophy is present. No focal disc protrusion or stenosis is present.  L3-4: Mild disc bulging is present. Moderate facet hypertrophy and ligamentum flavum thickening is noted. This leads to mild foraminal narrowing bilaterally.  L4-5: A broad-based disc protrusion is present. Moderate facet hypertrophy is noted bilaterally. This leads to moderate central canal stenosis with right greater than left subarticular narrowing. A central annular tear is noted. A synovial cyst extends  posteriorly from the right facet joint. Mild foraminal narrowing is evident bilaterally.  L5-S1: A left paramedian disc protrusion is present. Moderate facet hypertrophy is noted. This leads to moderate left subarticular narrowing with impact on the left S1 nerve roots. There is mild right subarticular narrowing. Moderate foraminal narrowing is present bilaterally.  IMPRESSION: 1. The most significant left-sided disease is at L5-S1 where a left paramedian disc protrusion results in moderate left subarticular narrowing, likely impacting the traversing left S1 nerve roots. 2. Moderate bilateral foraminal narrowing at L5-S1. 3. Right greater than left subarticular narrowing at L4-5 with mild foraminal narrowing bilaterally. 4. Mild bilateral foraminal narrowing at L3-4. 5. Multilevel facet disease as described.   Electronically Signed   By: San Morelle M.D.   On: 04/06/2019 08:27   Assessment  The primary encounter diagnosis was Lumbar facet arthropathy. Diagnoses of Lumbar spondylosis, Chronic left SI joint pain, Sacroiliac joint dysfunction of left side, Piriformis syndrome, left, and Chronic pain syndrome were also pertinent to this visit.  Plan of Care  Mr. Kayn Haymore. has a current medication list which includes the following long-term medication(s): losartan, nitroglycerin, pravastatin, and amlodipine.   Postprocedural evaluation after left sacroiliac joint injection and left piriformis injection.  Patient endorses significant pain relief rated as greater than 75% for his left SI joint and left piriformis.  He states that he has considerably less buttock pain and upper thigh pain.  He has been playing golf and states that the pain in that region is less when he is playing.  He is endorsing low back pain that is worse with lumbar extension and is relieved with forward flexion.  Patient's lumbar  MRI shows multilevel lumbar facet arthropathy and lumbar  spondylosis.  We discussed diagnostic facet medial branch nerve blocks for his low back pain related to lumbar facet arthropathy based upon physical exam findings and MRI findings as noted above.  Ricardo Winters. has a history of greater than 3 months of moderate pain.  The patient has tried various conservative therapeutic options such as NSAIDs, Tylenol, muscle relaxants, physical therapy which was inadequately effective.  Patient's pain is predominantly axial with physical exam findings and MRI findings suggestive of facet arthropathy.  Lumbar facet medial branch nerve blocks were discussed with the patient.  Risks and benefits were reviewed.  Patient would like to proceed with bilateral L3, L4, L5 medial branch nerve block.  We will place as needed order for repeat sacroiliac joint, piriformis injection should he have return of pain in that area.  Orders:  Orders Placed This Encounter  Procedures  . SACROILIAC JOINT INJECTION    Scheduling timeframe: (PRN procedure) Ricardo Winters will call when needed. Clinical indication: Low back pain, w/ or w/o groin pain. Sacroiliac joint pain Sedation: Usually done with sedation. (May be done without sedation if so desired by patient.) Requirements: NPO x 8 hrs.; Driver; Stop blood thinners.    Standing Status:   Standing    Number of Occurrences:   1    Standing Expiration Date:   01/03/2021    Scheduling Instructions:     Side: Bilateral     Sedation: Patient's choice.    Order Specific Question:   Where will this procedure be performed?    Answer:   [facility]  . TRIGGER POINT INJECTION    Standing Status:   Standing    Number of Occurrences:   6    Standing Expiration Date:   01/03/2021    Scheduling Instructions:     Area: Lower Extremity     Side: Bilateral     Sedation: Patient's choice.     TIMEFRAME: PRN procedure. (Ricardo Winters will call when needed.)    Order Specific Question:   Where will this procedure be performed?     Answer:   [facility]  . LUMBAR FACET(MEDIAL BRANCH NERVE BLOCK) MBNB    Standing Status:   Future    Standing Expiration Date:   08/05/2020    Scheduling Instructions:     Procedure: Lumbar facet block (AKA.: Lumbosacral medial branch nerve block)     Side: Bilateral     Level: L3-4, L4-5,  Facets ( L3, L4, L5,  Medial Branch Nerves)     Sedation: without     Timeframe: ASAA    Order Specific Question:   Where will this procedure be performed?    Answer:   [facility]   Follow-up plan:   Return in about 2 weeks (around 07/20/2020) for B/L L3, 4, 5 Fcts, without sedation.     Status post left L5-S1 ESI #1 on 05/06/2019, and #2 on 06/17/2019 as well as left sacroiliac joint injection #1 on 06/17/2019: Right SI joint injection, right piriformis injection on 04/20/2020-helped significantly; LEFT SI joint injection, LEFT piriformis injection on 05/30/20 helped significantly, repeat as needed.  Plan for diagnostic bilateral lumbar facet medial branch nerve blocks at L3, L4, L5.     Recent Visits Date Type Provider Dept  05/30/20 Procedure visit Gillis Santa, MD [facility]  05/26/20 Telemedicine Gillis Santa, MD [facility]  04/20/20 Procedure visit Gillis Santa, MD [facility]  04/13/20 Office Visit Gillis Santa, MD [facility]  Showing recent visits within past 90 days and meeting all other requirements Today's Visits Date Type Provider Dept  07/06/20 Telemedicine Gillis Santa, MD [facility]  Showing today's visits and meeting all other requirements Future Appointments No visits were found meeting these conditions. Showing future appointments within next 90 days and meeting all other requirements  I discussed the assessment and treatment plan with the patient. The patient was provided an opportunity to ask questions and all were answered. The patient agreed with the plan and demonstrated an understanding of the  instructions.  Patient advised to call back or seek an in-person evaluation if the symptoms or condition worsens.  Duration of encounter: 71mnutes.  Note by: BGillis Santa MD Date: 07/06/2020; Time: 3:05 PM

## 2020-07-11 ENCOUNTER — Encounter: Payer: Self-pay | Admitting: Cardiovascular Disease

## 2020-07-11 ENCOUNTER — Ambulatory Visit: Payer: Medicare Other | Admitting: Cardiovascular Disease

## 2020-07-11 ENCOUNTER — Other Ambulatory Visit: Payer: Self-pay

## 2020-07-11 VITALS — BP 152/80 | HR 51 | Ht 74.0 in | Wt 225.0 lb

## 2020-07-11 DIAGNOSIS — I25118 Atherosclerotic heart disease of native coronary artery with other forms of angina pectoris: Secondary | ICD-10-CM

## 2020-07-11 MED ORDER — EZETIMIBE 10 MG PO TABS: 10.0000 mg | ORAL_TABLET | Freq: Every day | ORAL | 3 refills | Status: DC

## 2020-07-11 NOTE — Progress Notes (Signed)
Cardiology Office Note  Date:  07/11/2020   ID:  [name]., DOB 05-17-1946, MRN [id number]  PCP:  Rusty Aus, MD   Chief Complaint  Patient presents with  . New Patient (Initial Visit)    Self referreal - wishing to switch from [facility]. Meds reviewed verbally with patient.     HPI:  [name] is a 74 year old gentleman with history of CAD, status post PCI of the RCA in 07/2014 after suffering a ST elevation myocardial infarction. bradycardia,  Hypertension,  hyperlipidemia,  h/o DVT, h/o COVID-19 infection,  osteoarthritis,  elevated prostate antigen,  PVD  lumbar radiculopathy  s/p spinal injections.  Who presents for new patient evaluation for his coronary disease stable angina  June 2021 through [facility] underwent cardiac testing echocardiogram revealed mild LV systolic dysfunction with mild LVH and an estimated EF of 45%.   Myoview was unremarkable and revealed no evidence of stress-induced myocardial ischemia.   Amlodipine changed to losartan on last visit at [facility] 130s/70  Prior visits to the hospital, records reviewed  ER 01/2020 Nonexertional chest pain enz neg  Er 03/10/2018 Syncope on lisinopril/hydrochlorothiazide at 20/12.5 May have been dehydrated   EKG personally reviewed by myself on todays visit Sinus rhythm, no significant ST-T wave changes    PMH:   has a past medical history of Anginal pain ([place]), Cancer ([place]), Coronary artery disease, COVID-19, Edema, Hypertension, Myocardial infarction ([place]), and Obesity.  PSH:    Past Surgical History:  Procedure Laterality Date  . COLONOSCOPY WITH PROPOFOL N/A 12/12/2015   Procedure: COLONOSCOPY WITH PROPOFOL;  Surgeon: [name], MD;  Location: [facility] END[facility];  Service: Endoscopy;  Laterality: N/A;  . SHOULDER ACROMIOPLASTY      Current Outpatient Medications  Medication Sig Dispense Refill  . amoxicillin (AMOXIL) 500 MG capsule Take 500 mg by mouth PRO. Prior to dental  work    . Ascorbic Acid (VITAMIN C) 500 MG CAPS Take by mouth daily.    Marland Kitchen aspirin 325 MG EC tablet Take 325 mg by mouth daily.    . Black Pepper-Turmeric (TURMERIC COMPLEX/BLACK PEPPER) 3-500 MG CAPS Take by mouth.    . Cholecalciferol (VITAMIN D3) 1.25 MG (50000 UT) TABS Take by mouth daily.    . COLLAGEN PO Take 1 Scoop by mouth.    Marland Kitchen GRAPE SEED CR PO Take by mouth daily.    Marland Kitchen losartan (COZAAR) 25 MG tablet Take 25 mg by mouth daily.     . nitroGLYCERIN (NITROSTAT) 0.4 MG SL tablet Place 0.4 mg under the tongue every 5 (five) minutes as needed for chest pain. Reported on 12/12/2015    . NON FORMULARY cbd     . Omega-3 1000 MG CAPS Take 1,000 mg by mouth daily.    . pravastatin (PRAVACHOL) 20 MG tablet Take 20 mg by mouth daily.    . sildenafil (REVATIO) 20 MG tablet Take 20 mg by mouth daily as needed (ED).     . tamsulosin (FLOMAX) 0.4 MG CAPS capsule Take by mouth.    . vitamin B-12 (CYANOCOBALAMIN) 500 MCG tablet Take 500 mcg by mouth daily.    Marland Kitchen zinc sulfate 220 (50 Zn) MG capsule Take 50 mg by mouth daily.     No current facility-administered medications for this visit.     Allergies:   Patient has no known allergies.   Social History:  The patient  reports that he has never smoked. He has never used smokeless tobacco. He reports current  alcohol use of about 4.0 standard drinks of alcohol per week. He reports that he does not use drugs.   Family History:   family history is not on file.    Review of Systems: Review of Systems  Constitutional: Negative.   HENT: Negative.   Respiratory: Negative.   Cardiovascular: Positive for leg swelling.  Gastrointestinal: Negative.   Musculoskeletal: Negative.   Neurological: Negative.   Psychiatric/Behavioral: Negative.   All other systems reviewed and are negative.    PHYSICAL EXAM: VS:  BP (!) 152/80 (BP Location: Right Arm, Patient Position: Sitting, Cuff Size: Normal)   Pulse (!) 51   Ht 6\' 2"  (1.88 m)   Wt 225 lb (102.1 kg)    SpO2 98%   BMI 28.89 kg/m  , BMI Body mass index is 28.89 kg/m. GEN: Well nourished, well developed, in no acute distress HEENT: normal Neck: no JVD, carotid bruits, or masses Cardiac: RRR; no murmurs, rubs, or gallops,no edema  Respiratory:  clear to auscultation bilaterally, normal work of breathing GI: soft, nontender, nondistended, + BS MS: no deformity or atrophy Skin: warm and dry, no rash Neuro:  Strength and sensation are intact Psych: euthymic mood, full affect   Recent Labs: 02/21/2020: BUN 20; Creatinine, Ser 1.09; Hemoglobin 15.5; Platelets 168; Potassium 4.0; Sodium 143    Lipid Panel Lab Results  Component Value Date   CHOL 237 (H) 08/02/2014   HDL 56 08/02/2014   LDLCALC 142 (H) 08/02/2014   TRIG 197 08/02/2014      Wt Readings from Last 3 Encounters:  07/11/20 225 lb (102.1 kg)  05/30/20 (!) 224 lb (101.6 kg)  04/20/20 226 lb (102.5 kg)      ASSESSMENT AND PLAN:  Problem List Items Addressed This Visit      Cardiology Problems   CAD (coronary artery disease) - Primary     Cad with stable angina Prior angioplasty results reviewed, stent to the RCA 2015 Currently with no symptoms of angina. No further workup at this time. Continue current medication regimen. Prior stress test reviewed, no significant ischemia We will decrease aspirin down to 81 mg daily  Decreased ejection fraction/cardiomyopathy Outside echocardiogram ejection fraction 45% Amlodipine recently changed to losartan by prior cardiology group No further medication changes made  NSTEMI s/p PCI/DES,  Prior DES 2015, Aspirin to 81 mg daily  Bradycardia,  Asymptomatic  H/o DVT, stable  recommend compression hose   Hypertension, Continue low-dose losartan  Hyperlipidemia We will add Zetia 10 mg daily to his pravastatin  PVD, chronic, stable  followed by vascular Denies claudication symptoms    Total encounter time more than 60 minutes  Greater than 50% was  spent in counseling and coordination of care with the patient    Signed, Esmond Plants, M.D., Ph.D. [place], [place]

## 2020-07-11 NOTE — Patient Instructions (Addendum)
Medication Instructions:  Please start zetia one a day Stay on pravastatin  Decrease aspirin down to 81 mg daily  If you need a refill on your cardiac medications before your next appointment, please call your pharmacy.    Lab work: No new labs needed   If you have labs (blood work) drawn today and your tests are completely normal, you will receive your results only by: Marland Kitchen MyChart Message (if you have MyChart) OR . A paper copy in the mail If you have any lab test that is abnormal or we need to change your treatment, we will call you to review the results.   Testing/Procedures: No new testing needed   Follow-Up: At [facility], you and your health needs are our priority.  As part of our continuing mission to provide you with exceptional heart care, we have created designated Provider Care Teams.  These Care Teams include your primary Cardiologist (physician) and Advanced Practice Providers (APPs -  Physician Assistants and Nurse Practitioners) who all work together to provide you with the care you need, when you need it.  . You will need a follow up appointment in 6 months  . Providers on your designated Care Team:   . Murray Hodgkins, NP . Christell Faith, PA-C . Marrianne Mood, PA-C  Any Other Special Instructions Will Be Listed Below (If Applicable).  COVID-19 Vaccine Information can be found at: ShippingScam.co.uk For questions related to vaccine distribution or appointments, please email vaccine@[place] .com or call 7735993351.

## 2020-07-25 ENCOUNTER — Telehealth: Payer: Self-pay | Admitting: Cardiovascular Disease

## 2020-07-25 NOTE — Telephone Encounter (Signed)
Pt c/o BP issue: STAT if pt c/o blurred vision, one-sided weakness or slurred speech  1. What are your last 5 BP readings? 153/100 pulse 56  2. Are you having any other symptoms (ex. Dizziness, headache, blurred vision, passed out)? Lightheaded   3. What is your BP issue? Patient calling, states his BP is unusually high - would like to discuss.  Please call.

## 2020-07-25 NOTE — Telephone Encounter (Signed)
Left voicemail message to call back regarding elevated blood pressures.

## 2020-07-26 NOTE — Patient Instructions (Addendum)
How to Take Your Blood Pressure You can take your blood pressure at home with a machine. You may need to check your blood pressure at home:  To check if you have high blood pressure (hypertension).  To check your blood pressure over time.  To make sure your blood pressure medicine is working. Supplies needed: You will need a blood pressure machine, or monitor. You can buy one at a drugstore or online. When choosing one:  Choose one with an arm cuff.  Choose one that wraps around your upper arm. Only one finger should fit between your arm and the cuff.  Do not choose one that measures your blood pressure from your wrist or finger. Your doctor can suggest a monitor. How to prepare Avoid these things for 30 minutes before checking your blood pressure:  Drinking caffeine.  Drinking alcohol.  Eating.  Smoking.  Exercising. Five minutes before checking your blood pressure:  Pee.  Sit in a dining chair. Avoid sitting in a soft couch or armchair.  Be quiet. Do not talk. How to take your blood pressure Follow the instructions that came with your machine. If you have a digital blood pressure monitor, these may be the instructions: 1. Sit up straight. 2. Place your feet on the floor. Do not cross your ankles or legs. 3. Rest your left arm at the level of your heart. You may rest it on a table, desk, or chair. 4. Pull up your shirt sleeve. 5. Wrap the blood pressure cuff around the upper part of your left arm. The cuff should be 1 inch (2.5 cm) above your elbow. It is best to wrap the cuff around bare skin. 6. Fit the cuff snugly around your arm. You should be able to place only one finger between the cuff and your arm. 7. Put the cord inside the groove of your elbow. 8. Press the power button. 9. Sit quietly while the cuff fills with air and loses air. 10. Write down the numbers on the screen. 11. Wait 2-3 minutes and then repeat steps 1-10. What do the numbers mean? Two  numbers make up your blood pressure. The first number is called systolic pressure. The second is called diastolic pressure. An example of a blood pressure reading is "120 over 80" (or 120/80). If you are an adult and do not have a medical condition, use this guide to find out if your blood pressure is normal: Normal  First number: below 120.  Second number: below 80. Elevated  First number: 120-129.  Second number: below 80. Hypertension stage 1  First number: 130-139.  Second number: 80-89. Hypertension stage 2  First number: 140 or above.  Second number: 90 or above. Your blood pressure is above normal even if only the top or bottom number is above normal. Follow these instructions at home:  Check your blood pressure as often as your doctor tells you to.  Take your monitor to your next doctor's appointment. Your doctor will: ? Make sure you are using it correctly. ? Make sure it is working right.  Make sure you understand what your blood pressure numbers should be.  Tell your doctor if your medicines are causing side effects. Contact a doctor if:  Your blood pressure keeps being high. Get help right away if:  Your first blood pressure number is higher than 180.  Your second blood pressure number is higher than 120. This information is not intended to replace advice given to you by your health   care provider. Make sure you discuss any questions you have with your health care provider. Document Revised: 09/27/2017 Document Reviewed: 03/23/2016 Elsevier Patient Education  2020 [employer].  How to Take Your Blood Pressure You can take your blood pressure at home with a machine. You may need to check your blood pressure at home:  To check if you have high blood pressure (hypertension).  To check your blood pressure over time.  To make sure your blood pressure medicine is working. Supplies needed: You will need a blood pressure machine, or monitor. You can buy one  at a drugstore or online. When choosing one:  Choose one with an arm cuff.  Choose one that wraps around your upper arm. Only one finger should fit between your arm and the cuff.  Do not choose one that measures your blood pressure from your wrist or finger. Your doctor can suggest a monitor. How to prepare Avoid these things for 30 minutes before checking your blood pressure:  Drinking caffeine.  Drinking alcohol.  Eating.  Smoking.  Exercising. Five minutes before checking your blood pressure:  Pee.  Sit in a dining chair. Avoid sitting in a soft couch or armchair.  Be quiet. Do not talk. How to take your blood pressure Follow the instructions that came with your machine. If you have a digital blood pressure monitor, these may be the instructions: 12. Sit up straight. 13. Place your feet on the floor. Do not cross your ankles or legs. 14. Rest your left arm at the level of your heart. You may rest it on a table, desk, or chair. 15. Pull up your shirt sleeve. 16. Wrap the blood pressure cuff around the upper part of your left arm. The cuff should be 1 inch (2.5 cm) above your elbow. It is best to wrap the cuff around bare skin. 17. Fit the cuff snugly around your arm. You should be able to place only one finger between the cuff and your arm. 18. Put the cord inside the groove of your elbow. 19. Press the power button. 20. Sit quietly while the cuff fills with air and loses air. 21. Write down the numbers on the screen. 22. Wait 2-3 minutes and then repeat steps 1-10. What do the numbers mean? Two numbers make up your blood pressure. The first number is called systolic pressure. The second is called diastolic pressure. An example of a blood pressure reading is "120 over 80" (or 120/80). If you are an adult and do not have a medical condition, use this guide to find out if your blood pressure is normal: Normal  First number: below 120.  Second number: below  80. Elevated  First number: 120-129.  Second number: below 80. Hypertension stage 1  First number: 130-139.  Second number: 80-89. Hypertension stage 2  First number: 140 or above.  Second number: 32 or above. Your blood pressure is above normal even if only the top or bottom number is above normal. Follow these instructions at home:  Check your blood pressure as often as your doctor tells you to.  Take your monitor to your next doctor's appointment. Your doctor will: ? Make sure you are using it correctly. ? Make sure it is working right.  Make sure you understand what your blood pressure numbers should be.  Tell your doctor if your medicines are causing side effects. Contact a doctor if:  Your blood pressure keeps being high. Get help right away if:  Your first blood  pressure number is higher than 180.  Your second blood pressure number is higher than 120. This information is not intended to replace advice given to you by your health care provider. Make sure you discuss any questions you have with your health care provider. Document Revised: 09/27/2017 Document Reviewed: 03/23/2016 Elsevier Patient Education  [place] Heart-healthy meal planning includes:  Eating less unhealthy fats.  Eating more healthy fats.  Making other changes in your diet. Talk with your doctor or a diet specialist (dietitian) to create an eating plan that is right for you. What is my plan? Your doctor may recommend an eating plan that includes:  Total fat: ______% or less of total calories a day.  Saturated fat: ______% or less of total calories a day.  Cholesterol: less than _________mg a day. What are tips for following this plan? Cooking Avoid frying your food. Try to bake, boil, grill, or broil it instead. You can also reduce fat by:  Removing the skin from poultry.  Removing all visible fats from meats.  Steaming vegetables in water  or broth. Meal planning   At meals, divide your plate into four equal parts: ? Fill one-half of your plate with vegetables and green salads. ? Fill one-fourth of your plate with whole grains. ? Fill one-fourth of your plate with lean protein foods.  Eat 4-5 servings of vegetables per day. A serving of vegetables is: ? 1 cup of raw or cooked vegetables. ? 2 cups of raw leafy greens.  Eat 4-5 servings of fruit per day. A serving of fruit is: ? 1 medium whole fruit. ?  cup of dried fruit. ?  cup of fresh, frozen, or canned fruit. ?  cup of 100% fruit juice.  Eat more foods that have soluble fiber. These are apples, broccoli, carrots, beans, peas, and barley. Try to get 20-30 g of fiber per day.  Eat 4-5 servings of nuts, legumes, and seeds per week: ? 1 serving of dried beans or legumes equals  cup after being cooked. ? 1 serving of nuts is  cup. ? 1 serving of seeds equals 1 tablespoon. General information  Eat more home-cooked food. Eat less restaurant, buffet, and fast food.  Limit or avoid alcohol.  Limit foods that are high in starch and sugar.  Avoid fried foods.  Lose weight if you are overweight.  Keep track of how much salt (sodium) you eat. This is important if you have high blood pressure. Ask your doctor to tell you more about this.  Try to add vegetarian meals each week. Fats  Choose healthy fats. These include olive oil and canola oil, flaxseeds, walnuts, almonds, and seeds.  Eat more omega-3 fats. These include salmon, mackerel, sardines, tuna, flaxseed oil, and ground flaxseeds. Try to eat fish at least 2 times each week.  Check food labels. Avoid foods with trans fats or high amounts of saturated fat.  Limit saturated fats. ? These are often found in animal products, such as meats, butter, and cream. ? These are also found in plant foods, such as palm oil, palm kernel oil, and coconut oil.  Avoid foods with partially hydrogenated oils in them.  These have trans fats. Examples are stick margarine, some tub margarines, cookies, crackers, and other baked goods. What foods can I eat? Fruits All fresh, canned (in natural juice), or frozen fruits. Vegetables Fresh or frozen vegetables (raw, steamed, roasted, or grilled). Green salads. Grains Most grains. Choose whole wheat  and whole grains most of the time. Rice and pasta, including brown rice and pastas made with whole wheat. Meats and other proteins Lean, well-trimmed beef, veal, pork, and lamb. Chicken and [place] without skin. All fish and shellfish. Wild duck, rabbit, pheasant, and venison. Egg whites or low-cholesterol egg substitutes. Dried beans, peas, lentils, and tofu. Seeds and most nuts. Dairy Low-fat or nonfat cheeses, including ricotta and mozzarella. Skim or 1% milk that is liquid, powdered, or evaporated. Buttermilk that is made with low-fat milk. Nonfat or low-fat yogurt. Fats and oils Non-hydrogenated (trans-free) margarines. Vegetable oils, including soybean, sesame, sunflower, olive, peanut, safflower, corn, canola, and cottonseed. Salad dressings or mayonnaise made with a vegetable oil. Beverages Mineral water. Coffee and tea. Diet carbonated beverages. Sweets and desserts Sherbet, gelatin, and fruit ice. Small amounts of dark chocolate. Limit all sweets and desserts. Seasonings and condiments All seasonings and condiments. The items listed above may not be a complete list of foods and drinks you can eat. Contact a dietitian for more options. What foods should I avoid? Fruits Canned fruit in heavy syrup. Fruit in cream or butter sauce. Fried fruit. Limit coconut. Vegetables Vegetables cooked in cheese, cream, or butter sauce. Fried vegetables. Grains Breads that are made with saturated or trans fats, oils, or whole milk. Croissants. Sweet rolls. Donuts. High-fat crackers, such as cheese crackers. Meats and other proteins Fatty meats, such as hot dogs, ribs,  sausage, bacon, rib-eye roast or steak. High-fat deli meats, such as salami and bologna. Caviar. Domestic duck and goose. Organ meats, such as liver. Dairy Cream, sour cream, cream cheese, and creamed cottage cheese. Whole-milk cheeses. Whole or 2% milk that is liquid, evaporated, or condensed. Whole buttermilk. Cream sauce or high-fat cheese sauce. Yogurt that is made from whole milk. Fats and oils Meat fat, or shortening. Cocoa butter, hydrogenated oils, palm oil, coconut oil, palm kernel oil. Solid fats and shortenings, including bacon fat, salt pork, lard, and butter. Nondairy cream substitutes. Salad dressings with cheese or sour cream. Beverages Regular sodas and juice drinks with added sugar. Sweets and desserts Frosting. Pudding. Cookies. Cakes. Pies. Milk chocolate or white chocolate. Buttered syrups. Full-fat ice cream or ice cream drinks. The items listed above may not be a complete list of foods and drinks to avoid. Contact a dietitian for more information. Summary  Heart-healthy meal planning includes eating less unhealthy fats, eating more healthy fats, and making other changes in your diet.  Eat a balanced diet. This includes fruits and vegetables, low-fat or nonfat dairy, lean protein, nuts and legumes, whole grains, and heart-healthy oils and fats. This information is not intended to replace advice given to you by your health care provider. Make sure you discuss any questions you have with your health care provider. Document Revised: 12/19/2017 Document Reviewed: 11/22/2017 Elsevier Patient Education  2020 [employer].

## 2020-07-26 NOTE — Telephone Encounter (Addendum)
Spoke with patient and he states that blood pressure (153/100) was before medications recheck today was 137/85 pulse 53 while sitting at computer. Yesterday he was driving and had some lightheadedness and ate candy bar then lightheadedness went away and he is on keto diet. Reviewed that he should check blood pressures 2 hours after medications each morning and to keep a log so that we can see how they trend. Reviewed that he can send them via My Chart or call them in. Reviewed that we like them to be less than 140/80 and if they consistently remain elevated to please give [place] a call back. He also wanted to know about diet for osteoarthritis. Advised that I would send him some information via My Chart and to let me know how his blood pressures run. He verbalized understanding of our conversation, agreement with plan, and had no further questions at this time.

## 2020-08-01 ENCOUNTER — Telehealth: Payer: Self-pay | Admitting: Student in an Organized Health Care Education/Training Program

## 2020-08-01 DIAGNOSIS — M1712 Unilateral primary osteoarthritis, left knee: Secondary | ICD-10-CM

## 2020-08-01 NOTE — Telephone Encounter (Signed)
Called patient to set up [place]. He wants to put this on hold and wants to have the knee procedure Dr. Holley Raring did a year ago. I see orders for TPI, SI joint, and Bi-lat Facets. Please have DR. Lateef put in orders for knee injections he did a year ago.

## 2020-08-02 NOTE — Telephone Encounter (Signed)
Order placed for left genicular nerve block

## 2020-08-08 ENCOUNTER — Ambulatory Visit (HOSPITAL_BASED_OUTPATIENT_CLINIC_OR_DEPARTMENT_OTHER): Payer: Medicare Other | Admitting: Student in an Organized Health Care Education/Training Program

## 2020-08-08 ENCOUNTER — Encounter: Payer: Self-pay | Admitting: Student in an Organized Health Care Education/Training Program

## 2020-08-08 ENCOUNTER — Other Ambulatory Visit: Payer: Self-pay

## 2020-08-08 ENCOUNTER — Ambulatory Visit
Admission: RE | Admit: 2020-08-08 | Discharge: 2020-08-08 | Disposition: A | Discharge status: Home / Self Care | Payer: Medicare Other | Source: Ambulatory Visit | Attending: Student in an Organized Health Care Education/Training Program | Admitting: Student in an Organized Health Care Education/Training Program

## 2020-08-08 VITALS — BP 182/92 | HR 56 | Temp 97.2°F | Resp 16 | Ht 73.0 in | Wt 225.0 lb

## 2020-08-08 DIAGNOSIS — M2559 Pain in other specified joint: Secondary | ICD-10-CM | POA: Insufficient documentation

## 2020-08-08 DIAGNOSIS — M533 Sacrococcygeal disorders, not elsewhere classified: Secondary | ICD-10-CM

## 2020-08-08 DIAGNOSIS — M47816 Spondylosis without myelopathy or radiculopathy, lumbar region: Secondary | ICD-10-CM | POA: Insufficient documentation

## 2020-08-08 DIAGNOSIS — M47896 Other spondylosis, lumbar region: Secondary | ICD-10-CM | POA: Diagnosis not present

## 2020-08-08 DIAGNOSIS — G894 Chronic pain syndrome: Secondary | ICD-10-CM | POA: Insufficient documentation

## 2020-08-08 DIAGNOSIS — G8929 Other chronic pain: Secondary | ICD-10-CM

## 2020-08-08 DIAGNOSIS — M4696 Unspecified inflammatory spondylopathy, lumbar region: Secondary | ICD-10-CM

## 2020-08-08 DIAGNOSIS — M461 Sacroiliitis, not elsewhere classified: Secondary | ICD-10-CM | POA: Diagnosis not present

## 2020-08-08 MED ORDER — LIDOCAINE HCL 2 % IJ SOLN
20.0000 mL | Freq: Once | INTRAMUSCULAR | Status: AC
  Administered 2020-08-08: 400 mg

## 2020-08-08 MED ORDER — ROPIVACAINE HCL 2 MG/ML IJ SOLN
9.0000 mL | Freq: Once | INTRAMUSCULAR | Status: AC
  Administered 2020-08-08: 9 mL via PERINEURAL

## 2020-08-08 MED ORDER — DEXAMETHASONE SODIUM PHOSPHATE 10 MG/ML IJ SOLN
INTRAMUSCULAR | Status: AC
  Filled 2020-08-08: qty 2

## 2020-08-08 MED ORDER — LIDOCAINE HCL 2 % IJ SOLN
INTRAMUSCULAR | Status: AC
  Filled 2020-08-08: qty 20

## 2020-08-08 MED ORDER — DEXAMETHASONE SODIUM PHOSPHATE 10 MG/ML IJ SOLN
10.0000 mg | Freq: Once | INTRAMUSCULAR | Status: AC
  Administered 2020-08-08: 10 mg

## 2020-08-08 MED ORDER — DEXAMETHASONE SODIUM PHOSPHATE 10 MG/ML IJ SOLN
INTRAMUSCULAR | Status: AC
  Filled 2020-08-08: qty 1

## 2020-08-08 MED ORDER — ROPIVACAINE HCL 2 MG/ML IJ SOLN
INTRAMUSCULAR | Status: AC
  Filled 2020-08-08: qty 20

## 2020-08-08 MED ORDER — IOHEXOL 180 MG/ML  SOLN
20.0000 mL | Freq: Once | INTRAMUSCULAR | Status: AC | PRN
  Administered 2020-08-08: 20 mL via INTRA_ARTICULAR

## 2020-08-08 NOTE — Progress Notes (Signed)
PROVIDER NOTE: Information contained herein reflects review and annotations entered in association with encounter. Interpretation of such information and data should be left to medically-trained personnel. Information provided to patient can be located elsewhere in the medical record under "Patient Instructions". Document created using STT-dictation technology, any transcriptional errors that may result from process are unintentional.    Patient: Ricardo Winters.  Service Category: Procedure  Provider: Gillis Santa, MD  DOB: December 13, 1945  DOS: 08/08/2020  Location: [facility]  MRN: [id number]  Setting: Ambulatory - outpatient  Referring Provider: Rusty Aus, MD  Type: Established Patient  Specialty: Interventional Pain Management  PCP: Rusty Aus, MD   Primary Reason for Visit: Interventional Pain Management Treatment. CC: Back Pain (lower)  Procedure:          Anesthesia, Analgesia, Anxiolysis:  Procedure #1: Type: Medial Branch Facet Block #1 Primary Purpose: Diagnostic Region: Lumbar Level:  L3, L4, L5, Medial Branch Level(s) Target Area: For Lumbar Facet blocks, the target is the groove formed by the junction of the transverse process and superior articular process. For the L5 dorsal ramus, the target is the notch between superior articular process and sacral ala. For the S1 dorsal ramus, the target is the superior and lateral edge of the posterior S1 Sacral foramen. Approach: Posterior, paramedial, percutaneous approach. Laterality: Bilateral  Procedure #2: Type: Sacroiliac Joint Block           Primary Purpose: Therapeutic Region: Posterior Lumbosacral Level: PIIS (Posterior Inferior Iliac Spine) Sacroiliac Joint Target Area: For lower sacroiliac joint block(s), the target is the inferior and posterior margin of the sacroiliac joint. Approach: Ipsilateral approach. Laterality: Bilateral  Type: Local Anesthesia  Local Anesthetic: Lidocaine  1-2%  Position: Prone   Indications: 1. Lumbar facet arthropathy   2. 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 Chronic left SI joint pain   5. Chronic right SI joint pain   6. Chronic pain syndrome    Pain Score: Pre-procedure: 3 /10 Post-procedure: 0-No pain/10   Pre-op Assessment:  Ricardo Winters is a 74 y.o. (year old), male patient, seen today for interventional treatment. He  has a past surgical history that includes Shoulder acromioplasty and Colonoscopy with propofol (N/A, 12/12/2015). Ricardo Winters has a current medication list which includes the following prescription(s): vitamin c, aspirin ec, black pepper-turmeric, vitamin d3, collagen, ezetimibe, grape seed, losartan, nitroglycerin, NON FORMULARY, omega-3, pravastatin, sildenafil, tamsulosin, vitamin b-12, zinc sulfate, and amoxicillin. His primarily concern today is the Back Pain (lower)  Initial Vital Signs:  Pulse/HCG Rate: (!) 56ECG Heart Rate: (!) 54 Temp: (!) 97.2 F (36.2 C) Resp: 16 BP: (!) 178/85 SpO2: 100 %  BMI: Estimated body mass index is 29.69 kg/m as calculated from the following:   Height as of this encounter: 6\' 1"  (1.854 m).   Weight as of this encounter: 225 lb (102.1 kg).  Risk Assessment: Allergies: Reviewed. He has No Known Allergies.  Allergy Precautions: None required Coagulopathies: Reviewed. None identified.  Blood-thinner therapy: None at this time Active Infection(s): Reviewed. None identified. Ricardo Winters is afebrile  Site Confirmation: Ricardo Winters was asked to confirm the procedure and laterality before marking the site Procedure checklist: Completed Consent: Before the procedure and under the influence of no sedative(s), amnesic(s), or anxiolytics, the patient was informed of the treatment options, risks and possible complications. To fulfill our ethical and legal obligations, as recommended by the American Medical Association's Code of Ethics, I have  informed the  patient of my clinical impression; the nature and purpose of the treatment or procedure; the risks, benefits, and possible complications of the intervention; the alternatives, including doing nothing; the risk(s) and benefit(s) of the alternative treatment(s) or procedure(s); and the risk(s) and benefit(s) of doing nothing. The patient was provided information about the general risks and possible complications associated with the procedure. These may include, but are not limited to: failure to achieve desired goals, infection, bleeding, organ or nerve damage, allergic reactions, paralysis, and death. In addition, the patient was informed of those risks and complications associated to Spine-related procedures, such as failure to decrease pain; infection (i.e.: Meningitis, epidural or intraspinal abscess); bleeding (i.e.: epidural hematoma, subarachnoid hemorrhage, or any other type of intraspinal or peri-dural bleeding); organ or nerve damage (i.e.: Any type of peripheral nerve, nerve root, or spinal cord injury) with subsequent damage to sensory, motor, and/or autonomic systems, resulting in permanent pain, numbness, and/or weakness of one or several areas of the body; allergic reactions; (i.e.: anaphylactic reaction); and/or death. Furthermore, the patient was informed of those risks and complications associated with the medications. These include, but are not limited to: allergic reactions (i.e.: anaphylactic or anaphylactoid reaction(s)); adrenal axis suppression; blood sugar elevation that in diabetics may result in ketoacidosis or comma; water retention that in patients with history of congestive heart failure may result in shortness of breath, pulmonary edema, and decompensation with resultant heart failure; weight gain; swelling or edema; medication-induced neural toxicity; particulate matter embolism and blood vessel occlusion with resultant organ, and/or nervous system infarction; and/or aseptic necrosis  of one or more joints. Finally, the patient was informed that Medicine is not an exact science; therefore, there is also the possibility of unforeseen or unpredictable risks and/or possible complications that may result in a catastrophic outcome. The patient indicated having understood very clearly. We have given the patient no guarantees and we have made no promises. Enough time was given to the patient to ask questions, all of which were answered to the patient's satisfaction. Mr. Valent has indicated that he wanted to continue with the procedure. Attestation: I, the ordering provider, attest that I have discussed with the patient the benefits, risks, side-effects, alternatives, likelihood of achieving goals, and potential problems during recovery for the procedure that I have provided informed consent. Date  Time: 08/08/2020  9:43 AM  Pre-Procedure Preparation:  Monitoring: As per clinic protocol. Respiration, ETCO2, SpO2, BP, heart rate and rhythm monitor placed and checked for adequate function Safety Precautions: Patient was assessed for positional comfort and pressure points before starting the procedure. Time-out: I initiated and conducted the "Time-out" before starting the procedure, as per protocol. The patient was asked to participate by confirming the accuracy of the "Time Out" information. Verification of the correct person, site, and procedure were performed and confirmed by me, the nursing staff, and the patient. "Time-out" conducted as per Joint Commission's Universal Protocol (UP.01.01.01). Time: 1018  Description of Procedure #1:   Laterality:  Bilateral  Levels: L3, L4, L5,  Medial Branch Level(s) Area Prepped: Posterior Lumbosacral Region DuraPrep (Iodine Povacrylex [0.7% available iodine] and Isopropyl Alcohol, 74% w/w) Safety Precautions: Aspiration looking for blood return was conducted prior to all injections. At no point did we inject any substances, as a needle was being  advanced. Before injecting, the patient was told to immediately notify me if he was experiencing any new onset of "ringing in the ears, or metallic taste in the mouth". No attempts were made at  seeking any paresthesias. Safe injection practices and needle disposal techniques used. Medications properly checked for expiration dates. SDV (single dose vial) medications used. After the completion of the procedure, all disposable equipment used was discarded in the proper designated medical waste containers. Local Anesthesia: Protocol guidelines were followed. The patient was positioned over the fluoroscopy table. The area was prepped in the usual manner. The time-out was completed. The target area was identified using fluoroscopy. A 12-in long, straight, sterile hemostat was used with fluoroscopic guidance to locate the targets for each level blocked. Once located, the skin was marked with an approved surgical skin marker. Once all sites were marked, the skin (epidermis, dermis, and hypodermis), as well as deeper tissues (fat, connective tissue and muscle) were infiltrated with a small amount of a short-acting local anesthetic, loaded on a 10cc syringe with a 25G, 1.5-in  Needle. An appropriate amount of time was allowed for local anesthetics to take effect before proceeding to the next step. Local Anesthetic: Lidocaine 2.0% The unused portion of the local anesthetic was discarded in the proper designated containers. Technical explanation of process:   L3 Medial Branch Nerve Block (MBB): The target area for the L3 medial branch is at the junction of the postero-lateral aspect of the superior articular process and the superior, posterior, and medial edge of the transverse process of L4. Under fluoroscopic guidance, a Quincke needle was inserted until contact was made with os over the superior postero-lateral aspect of the pedicular shadow (target area). After negative aspiration for blood, 2 mL of the nerve block  solution was injected without difficulty or complication. The needle was removed intact. L4 Medial Branch Nerve Block (MBB): The target area for the L4 medial branch is at the junction of the postero-lateral aspect of the superior articular process and the superior, posterior, and medial edge of the transverse process of L5. Under fluoroscopic guidance, a Quincke needle was inserted until contact was made with os over the superior postero-lateral aspect of the pedicular shadow (target area). After negative aspiration for blood, 75mL of the nerve block solution was injected without difficulty or complication. The needle was removed intact. L5 Medial Branch Nerve Block (MBB): The target area for the L5 medial branch is at the junction of the postero-lateral aspect of the superior articular process and the superior, posterior, and medial edge of the sacral ala. Under fluoroscopic guidance, a Quincke needle was inserted until contact was made with os over the superior postero-lateral aspect of the pedicular shadow (target area). After negative aspiration for blood, 2 mL of the nerve block solution was injected without difficulty or complication. The needle was removed intact.   Nerve block solution: 12 cc solution made of 10 cc of 0.2% ropivacaine, 2 cc of Decadron 10 mg/cc.  2 cc injected at each level above bilaterally.  The unused portion of the solution was discarded in the proper designated containers. Procedural Needles: 22-gauge, 3.5-inch, Quincke needles used for all levels.  Once the entire procedure was completed, the treated area was cleaned, making sure to leave some of the prepping solution back to take advantage of its long term bactericidal properties.   Illustration of the posterior view of the lumbar spine and the posterior neural structures. Laminae of L2 through S1 are labeled. DPRL5, dorsal primary ramus of L5; DPRS1, dorsal primary ramus of S1; DPR3, dorsal primary ramus of L3; FJ, facet  (zygapophyseal) joint L3-L4; I, inferior articular process of L4; LB1, lateral branch of dorsal primary ramus of  L1; IAB, inferior articular branches from L3 medial branch (supplies L4-L5 facet joint); IBP, intermediate branch plexus; MB3, medial branch of dorsal primary ramus of L3; NR3, third lumbar nerve root; S, superior articular process of L5; SAB, superior articular branches from L4 (supplies L4-5 facet joint also); TP3, transverse process of L3.   Description of Procedure # 2:   Area Prepped: Entire Posterior Lumbosacral Region DuraPrep (Iodine Povacrylex [0.7% available iodine] and Isopropyl Alcohol, 74% w/w) Safety Precautions: Aspiration looking for blood return was conducted prior to all injections. At no point did we inject any substances, as a needle was being advanced. No attempts were made at seeking any paresthesias. Safe injection practices and needle disposal techniques used. Medications properly checked for expiration dates. SDV (single dose vial) medications used. Description of the Procedure: Protocol guidelines were followed. The patient was placed in position over the fluoroscopy table. The target area was identified and the area prepped in the usual manner. Skin & deeper tissues infiltrated with local anesthetic. Appropriate amount of time allowed to pass for local anesthetics to take effect. The procedure needle was advanced under fluoroscopic guidance into the sacroiliac joint until a firm endpoint was obtained. Proper needle placement secured. Negative aspiration confirmed. Solution injected in intermittent fashion, asking for systemic symptoms every 0.5cc of injectate. The needles were then removed and the area cleansed, making sure to leave some of the prepping solution back to take advantage of its long term bactericidal properties. Vitals:   08/08/20 1027 08/08/20 1032 08/08/20 1037 08/08/20 1039  BP: (!) 169/92 (!) 180/93 (!) 178/90 (!) 182/92  Pulse:      Resp: 16 16 16  16   Temp:      TempSrc:      SpO2: 100% 100% 99% 100%  Weight:      Height:        End Time: 1039 hrs. Materials:  Needle(s) Type: Spinal Needle Gauge: 22G Length: 3.5-in Medication(s): Please see orders for medications and dosing details.  Imaging Guidance (Spinal):          Type of Imaging Technique: Fluoroscopy Guidance (Spinal) Indication(s): Assistance in needle guidance and placement for procedures requiring needle placement in or near specific anatomical locations not easily accessible without such assistance. Exposure Time: Please see nurses notes. Contrast: Before injecting any contrast, we confirmed that the patient did not have an allergy to iodine, shellfish, or radiological contrast. Once satisfactory needle placement was completed at the desired level, radiological contrast was injected. Contrast injected under live fluoroscopy. No contrast complications. See chart for type and volume of contrast used.  Contrast used for bilateral sacroiliac joint injection Fluoroscopic Guidance: I was personally present during the use of fluoroscopy. "Tunnel Vision Technique" used to obtain the best possible view of the target area. Parallax error corrected before commencing the procedure. "Direction-depth-direction" technique used to introduce the needle under continuous pulsed fluoroscopy. Once target was reached, antero-posterior, oblique, and lateral fluoroscopic projection used confirm needle placement in all planes. Images permanently stored in EMR. Interpretation: I personally interpreted the imaging intraoperatively. Adequate needle placement confirmed in multiple planes. Appropriate spread of contrast into desired area was observed. No evidence of afferent or efferent intravascular uptake. No intrathecal or subarachnoid spread observed. Permanent images saved into the patient's record.   Post-operative Assessment:  Post-procedure Vital Signs:  Pulse/HCG Rate: (!) 56(!) 54 Temp: (!)  97.2 F (36.2 C) Resp: 16 BP: (!) 182/92 SpO2: 100 %  EBL: None  Complications: No immediate post-treatment complications observed  by team, or reported by patient.  Note: The patient tolerated the entire procedure well. A repeat set of vitals were taken after the procedure and the patient was kept under observation following institutional policy, for this type of procedure. Post-procedural neurological assessment was performed, showing return to baseline, prior to discharge. The patient was provided with post-procedure discharge instructions, including a section on how to identify potential problems. Should any problems arise concerning this procedure, the patient was given instructions to immediately contact us, at any time, without hesitation. In any case, we plan to contact the patient by telephone for a follow-up status report regarding this interventional procedure.  Comments:  No additional relevant information.  Plan of Care  Orders:  Orders Placed This Encounter  Procedures  . [facility] C-ARM 1-60 MIN NO REPORT    Intraoperative interpretation by procedural physician at [facility].    Standing Status:   Standing    Number of Occurrences:   1    Order Specific Question:   Reason for exam:    Answer:   Assistance in needle guidance and placement for procedures requiring needle placement in or near specific anatomical locations not easily accessible without such assistance.    Medications ordered for procedure: Meds ordered this encounter  Medications  . lidocaine (XYLOCAINE) 2 % (with pres) injection 400 mg  . ropivacaine (PF) 2 mg/mL (0.2%) (NAROPIN) injection 9 mL  . ropivacaine (PF) 2 mg/mL (0.2%) (NAROPIN) injection 9 mL  . dexamethasone (DECADRON) injection 10 mg  . dexamethasone (DECADRON) injection 10 mg  . dexamethasone (DECADRON) injection 10 mg  . iohexol (OMNIPAQUE) 180 MG/ML injection 20 mL   Medications administered: We administered lidocaine,  ropivacaine (PF) 2 mg/mL (0.2%), ropivacaine (PF) 2 mg/mL (0.2%), dexamethasone, dexamethasone, dexamethasone, and iohexol.  See the medical record for exact dosing, route, and time of administration.  Follow-up plan:   Return in about 8 weeks (around 10/03/2020) for Post Procedure Evaluation, virtual.      Status post left L5-S1 ESI #1 on 05/06/2019, and #2 on 06/17/2019 as well as left sacroiliac joint injection #1 on 06/17/2019: Right SI joint injection, right piriformis injection on 04/20/2020-helped significantly; LEFT SI joint injection, LEFT piriformis injection on 05/30/20 helped significantly, repeat as needed.  08/08/2020: Diagnostic bilateral lumbar facet medial branch nerve blocks at L3, L4, L5.  Bilateral sacroiliac joint injection     Recent Visits Date Type Provider Dept  07/06/20 Telemedicine Gillis Santa, MD [facility]  05/30/20 Procedure visit Gillis Santa, MD [facility]  05/26/20 Telemedicine Gillis Santa, MD [facility]  Showing recent visits within past 90 days and meeting all other requirements Today's Visits Date Type Provider Dept  08/08/20 Procedure visit Gillis Santa, MD [facility]  Showing today's visits and meeting all other requirements Future Appointments Date Type Provider Dept  10/03/20 Appointment Milinda Pointer, MD [facility]  Showing future appointments within next 90 days and meeting all other requirements  Disposition: Discharge home  Discharge (Date  Time): 08/08/2020; 1050 hrs.   Primary Care Physician: Rusty Aus, MD Location: [facility] Note by: Gillis Santa, MD Date: 08/08/2020; Time: 12:04 PM  Disclaimer:  Medicine is not an exact science. The only guarantee in medicine is that nothing is guaranteed. It is important to note that the decision to proceed with this intervention was based on the information collected from the patient. The Data and conclusions  were drawn from the patient's  questionnaire, the interview, and the physical examination. Because the information was provided in large part by the patient, it cannot be guaranteed that it has not been purposely or unconsciously manipulated. Every effort has been made to obtain as much relevant data as possible for this evaluation. It is important to note that the conclusions that lead to this procedure are derived in large part from the available data. Always take into account that the treatment will also be dependent on availability of resources and existing treatment guidelines, considered by other Pain Management Practitioners as being common knowledge and practice, at the time of the intervention. For Medico-Legal purposes, it is also important to point out that variation in procedural techniques and pharmacological choices are the acceptable norm. The indications, contraindications, technique, and results of the above procedure should only be interpreted and judged by a Board-Certified Interventional Pain Specialist with extensive familiarity and expertise in the same exact procedure and technique.

## 2020-08-08 NOTE — Patient Instructions (Signed)
Pain Management Discharge Instructions  General Discharge Instructions :  If you need to reach your doctor call: Monday-Friday 8:00 am - 4:00 pm at [phone] or toll free [phone].  After clinic hours [phone] to have operator reach doctor.  Bring all of your medication bottles to all your appointments in the pain clinic.  To cancel or reschedule your appointment with Pain Management please remember to call 24 hours in advance to avoid a fee.  Refer to the educational materials which you have been given on: General Risks, I had my Procedure. Discharge Instructions, Post Sedation.  Post Procedure Instructions:  The drugs you were given will stay in your system until tomorrow, so for the next 24 hours you should not drive, make any legal decisions or drink any alcoholic beverages.  You may eat anything you prefer, but it is better to start with liquids then soups and crackers, and gradually work up to solid foods.  Please notify your doctor immediately if you have any unusual bleeding, trouble breathing or pain that is not related to your normal pain.  Depending on the type of procedure that was done, some parts of your body may feel week and/or numb.  This usually clears up by tonight or the next day.  Walk with the use of an assistive device or accompanied by an adult for the 24 hours.  You may use ice on the affected area for the first 24 hours.  Put ice in a Ziploc bag and cover with a towel and place against area 15 minutes on 15 minutes off.  You may switch to heat after 24 hours.Sacroiliac (SI) Joint Injection Patient Information  Description: The sacroiliac joint connects the scrum (very low back and tailbone) to the ilium (a pelvic bone which also forms half of the hip joint).  Normally this joint experiences very little motion.  When this joint becomes inflamed or unstable low back and or hip and pelvis pain may result.  Injection of this joint with local anesthetics  (numbing medicines) and steroids can provide diagnostic information and reduce pain.  This injection is performed with the aid of x-ray guidance into the tailbone area while you are lying on your stomach.   You may experience an electrical sensation down the leg while this is being done.  You may also experience numbness.  We also may ask if we are reproducing your normal pain during the injection.  Conditions which may be treated SI injection:   Low back, buttock, hip or leg pain  Preparation for the Injection:  1. Do not eat any solid food or dairy products within 8 hours of your appointment.  2. You may drink clear liquids up to 3 hours before appointment.  Clear liquids include water, black coffee, juice or soda.  No milk or cream please. 3. You may take your regular medications, including pain medications with a sip of water before your appointment.  Diabetics should hold regular insulin (if take separately) and take 1/2 normal NPH dose the morning of the procedure.  Carry some sugar containing items with you to your appointment. 4. A driver must accompany you and be prepared to drive you home after your procedure. 5. Bring all of your current medications with you. 6. An IV may be inserted and sedation may be given at the discretion of the physician. 7. A blood pressure cuff, EKG and other monitors will often be applied during the procedure.  Some patients may need to have extra oxygen  administered for a short period.  8. You will be asked to provide medical information, including your allergies, prior to the procedure.  We must know immediately if you are taking blood thinners (like Coumadin/Warfarin) or if you are allergic to IV iodine contrast (dye).  We must know if you could possible be pregnant.  Possible side effects:   Bleeding from needle site  Infection (rare, may require surgery)  Nerve injury (rare)  Numbness & tingling (temporary)  A brief convulsion or  seizure  Light-headedness (temporary)  Pain at injection site (several days)  Decreased blood pressure (temporary)  Weakness in the leg (temporary)   Call if you experience:   New onset weakness or numbness of an extremity below the injection site that last more than 8 hours.  Hives or difficulty breathing ( go to the emergency room)  Inflammation or drainage at the injection site  Any new symptoms which are concerning to you  Please note:  Although the local anesthetic injected can often make your back/ hip/ buttock/ leg feel good for several hours after the injections, the pain will likely return.  It takes 3-7 days for steroids to work in the sacroiliac area.  You may not notice any pain relief for at least that one week.  If effective, we will often do a series of three injections spaced 3-6 weeks apart to maximally decrease your pain.  After the initial series, we generally will wait some months before a repeat injection of the same type.  If you have any questions, please call [phone] [facility]  Facet Blocks Patient Information  Description: The facets are joints in the spine between the vertebrae.  Like any joints in the body, facets can become irritated and painful.  Arthritis can also effect the facets.  By injecting steroids and local anesthetic in and around these joints, we can temporarily block the nerve supply to them.  Steroids act directly on irritated nerves and tissues to reduce selling and inflammation which often leads to decreased pain.  Facet blocks may be done anywhere along the spine from the neck to the low back depending upon the location of your pain.   After numbing the skin with local anesthetic (like Novocaine), a small needle is passed onto the facet joints under x-ray guidance.  You may experience a sensation of pressure while this is being done.  The entire block usually lasts about 15-25 minutes.    Conditions which may be treated by facet blocks:   Low back/buttock pain  Neck/shoulder pain  Certain types of headaches  Preparation for the injection:  10. Do not eat any solid food or dairy products within 8 hours of your appointment. 11. You may drink clear liquid up to 3 hours before appointment.  Clear liquids include water, black coffee, juice or soda.  No milk or cream please. 12. You may take your regular medication, including pain medications, with a sip of water before your appointment.  Diabetics should hold regular insulin (if taken separately) and take 1/2 normal NPH dose the morning of the procedure.  Carry some sugar containing items with you to your appointment. 13. A driver must accompany you and be prepared to drive you home after your procedure. 93. Bring all your current medications with you. 15. An IV may be inserted and sedation may be given at the discretion of the physician. 16. A blood pressure cuff, EKG and other monitors will often be applied during  the procedure.  Some patients may need to have extra oxygen administered for a short period. 24. You will be asked to provide medical information, including your allergies and medications, prior to the procedure.  We must know immediately if you are taking blood thinners (like Coumadin/Warfarin) or if you are allergic to IV iodine contrast (dye).  We must know if you could possible be pregnant.  Possible side-effects:   Bleeding from needle site  Infection (rare, may require surgery)  Nerve injury (rare)  Numbness & tingling (temporary)  Difficulty urinating (rare, temporary)  Spinal headache (a headache worse with upright posture)  Light-headedness (temporary)  Pain at injection site (serveral days)  Decreased blood pressure (rare, temporary)  Weakness in arm/leg (temporary)  Pressure sensation in back/neck (temporary)   Call if you experience:   Fever/chills associated with headache or  increased back/neck pain  Headache worsened by an upright position  New onset, weakness or numbness of an extremity below the injection site  Hives or difficulty breathing (go to the emergency room)  Inflammation or drainage at the injection site(s)  Severe back/neck pain greater than usual  New symptoms which are concerning to you  Please note:  Although the local anesthetic injected can often make your back or neck feel good for several hours after the injection, the pain will likely return. It takes 3-7 days for steroids to work.  You may not notice any pain relief for at least one week.  If effective, we will often do a series of 2-3 injections spaced 3-6 weeks apart to maximally decrease your pain.  After the initial series, you may be a candidate for a more permanent nerve block of the facets.  If you have any questions, please call #[phone]) [facility]

## 2020-08-08 NOTE — Progress Notes (Signed)
Safety precautions to be maintained throughout the outpatient stay will include: orient to surroundings, keep bed in low position, maintain call bell within reach at all times, provide assistance with transfer out of bed and ambulation.  

## 2020-08-09 ENCOUNTER — Telehealth: Payer: Self-pay | Admitting: Cardiovascular Disease

## 2020-08-09 ENCOUNTER — Telehealth: Payer: Self-pay | Admitting: *Deleted

## 2020-08-09 NOTE — Telephone Encounter (Signed)
No problems post procedure. 

## 2020-08-09 NOTE — Telephone Encounter (Signed)
Spoke with patient and he was at pain management and got several injections at that time. He states that since then his blood pressures have been in the range of 145/80's. Requested that he take blood pressure 2 hours after medications tomorrow morning and then again Thursday and call me with those readings. He did inquire if he could increase his losartan and advised that I would review with Dr. Rockey Situ but otherwise take as instructed until I hear back from him. He verbalized understanding of our conversation, agreement with plan, and had no further questions.

## 2020-08-09 NOTE — Telephone Encounter (Signed)
Pt c/o BP issue: STAT if pt c/o blurred vision, one-sided weakness or slurred speech  1. What are your last 5 BP readings?  177/90 yesterday 157/86 today, waited a little while then 140/80  2. Are you having any other symptoms (ex. Dizziness, headache, blurred vision, passed out)? no  3. What is your BP issue? Elevated.  He had and injection in pain management yesterday, and thinks his BP issues may have came from this. Please call to discuss.

## 2020-08-12 ENCOUNTER — Telehealth: Payer: Self-pay | Admitting: Cardiovascular Disease

## 2020-08-12 NOTE — Telephone Encounter (Signed)
1. 128/79 HR 48 10/13 @ 8:45 AM 2. 157/84 HR 45 10/13 @ 10:00 AM 3. 141/78 HR 46 10/14 @ 10:00 AM 4. 147/86 HR 52 10/15 @ 08:30 AM 5. 159/93 HR 60 10/15 @ 1:15 pm after walking  He has had blood pressure monitor for about 2 years. Patient was curious if that could be an issue. So recommended that he could go to [facility] or [facility] pharmacy to have manual blood pressure done and then he could use his to check to see how close they are. He did inquire if HR being low should be a concern. Inquired if he was active or athletic because their heart rates can run low. He verbalized that he does stay active with walking and other activities.  Advised I would send to provider for review and any further recommendations. He verbalized understanding with no further questions at this time.

## 2020-08-14 NOTE — Telephone Encounter (Signed)
BP elevated, We should make some slow changes to get systolic pressure <289 Would increase losartan up to 50 mg daily (100 mg is max dose) If we slowly get to max and BP not well controlled, We may need a second medication.  Monitor heart rate for now, no meds slowing it down

## 2020-08-15 MED ORDER — LOSARTAN POTASSIUM 50 MG PO TABS: 50.0000 mg | ORAL_TABLET | Freq: Every day | ORAL | 3 refills | Status: DC

## 2020-08-15 NOTE — Telephone Encounter (Signed)
Reviewed provider recommendations with patient. He was agreeable with plan and will send in new prescription. He did understand our goal to get his systolic pressure <688 and states that if he gets it under control he will not call back. He does keep a log of readings and will call back if he is unable to get it under control. Increased dose of losartan sent in for him and reviewed instructions. He verbalized understanding with no further questions at this time.

## 2020-08-15 NOTE — Addendum Note (Signed)
Addended by: Valora Corporal on: 08/15/2020 12:12 PM   Modules accepted: Orders

## 2020-08-31 ENCOUNTER — Ambulatory Visit: Payer: Medicare Other | Admitting: Podiatry

## 2020-08-31 ENCOUNTER — Encounter: Payer: Self-pay | Admitting: Podiatry

## 2020-08-31 ENCOUNTER — Other Ambulatory Visit: Payer: Self-pay

## 2020-08-31 DIAGNOSIS — M79674 Pain in right toe(s): Secondary | ICD-10-CM

## 2020-08-31 DIAGNOSIS — L84 Corns and callosities: Secondary | ICD-10-CM | POA: Diagnosis not present

## 2020-08-31 DIAGNOSIS — M2041 Other hammer toe(s) (acquired), right foot: Secondary | ICD-10-CM

## 2020-08-31 NOTE — Progress Notes (Signed)
  Subjective:  Patient ID: Ricardo Winters., male    DOB: [birth year],  MRN: [id number]  Chief Complaint  Patient presents with  . Callouses    Patient presents today for painful corn to right 5th toe x 2-3 months  He says it stings and burns with certain shoes he wears    74 y.o. male presents with the above complaint. History confirmed with patient. He likes to stay active and walks 9 holes regularly. Has tried a corn pad before w/o relief  Objective:  Physical Exam: warm, good capillary refill, no trophic changes or ulcerative lesions, normal DP and PT pulses and normal sensory exam.   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 Hammertoe of right foot   2. Heloma molle   3. Pain in toe of right foot      Plan:  Patient was evaluated and treated and all questions answered.  Discussed etiology and treatment options of painful hammertoes and the heloma molle that is formed in the fourth interspace.  I recommended we try nonoperative treatment of this first with offloading pads.  Silicone toe caps were dispensed to him which he has not tried yet.  Also debrided the lesion with a chisel blade to a tolerable level to remove all hyperkeratosis.  Discussed with him that if these measures do not work we should consider surgical correction of the hammertoe deformities to alleviate the lesion.  He will consider this.  Return in about 3 weeks (around 09/21/2020).

## 2020-09-14 ENCOUNTER — Other Ambulatory Visit: Payer: Self-pay

## 2020-09-14 ENCOUNTER — Encounter: Payer: Self-pay | Admitting: Student in an Organized Health Care Education/Training Program

## 2020-09-14 ENCOUNTER — Ambulatory Visit (HOSPITAL_BASED_OUTPATIENT_CLINIC_OR_DEPARTMENT_OTHER): Payer: Medicare Other | Admitting: Student in an Organized Health Care Education/Training Program

## 2020-09-14 ENCOUNTER — Ambulatory Visit
Admission: RE | Admit: 2020-09-14 | Discharge: 2020-09-14 | Disposition: A | Discharge status: Home / Self Care | Payer: Medicare Other | Source: Ambulatory Visit | Attending: Student in an Organized Health Care Education/Training Program | Admitting: Student in an Organized Health Care Education/Training Program

## 2020-09-14 VITALS — BP 173/92 | HR 55 | Temp 96.7°F | Resp 17 | Ht 72.0 in | Wt 230.0 lb

## 2020-09-14 DIAGNOSIS — G894 Chronic pain syndrome: Secondary | ICD-10-CM | POA: Insufficient documentation

## 2020-09-14 DIAGNOSIS — M1712 Unilateral primary osteoarthritis, left knee: Secondary | ICD-10-CM | POA: Diagnosis not present

## 2020-09-14 MED ORDER — ROPIVACAINE HCL 2 MG/ML IJ SOLN
INTRAMUSCULAR | Status: AC
  Filled 2020-09-14: qty 10

## 2020-09-14 MED ORDER — DEXAMETHASONE SODIUM PHOSPHATE 10 MG/ML IJ SOLN
INTRAMUSCULAR | Status: AC
  Filled 2020-09-14: qty 1

## 2020-09-14 MED ORDER — LIDOCAINE HCL 2 % IJ SOLN
INTRAMUSCULAR | Status: AC
  Filled 2020-09-14: qty 20

## 2020-09-14 MED ORDER — ROPIVACAINE HCL 2 MG/ML IJ SOLN
9.0000 mL | Freq: Once | INTRAMUSCULAR | Status: AC
  Administered 2020-09-14: 10 mL

## 2020-09-14 MED ORDER — LIDOCAINE HCL 2 % IJ SOLN
20.0000 mL | Freq: Once | INTRAMUSCULAR | Status: AC
  Administered 2020-09-14: 400 mg

## 2020-09-14 MED ORDER — DEXAMETHASONE SODIUM PHOSPHATE 10 MG/ML IJ SOLN
10.0000 mg | Freq: Once | INTRAMUSCULAR | Status: AC
  Administered 2020-09-14: 10 mg

## 2020-09-14 NOTE — Progress Notes (Signed)
Patient's Name: Ricardo Winters.  MRN: [id number]  Referring Provider: Rusty Aus, MD  DOB: 01/28/1946  PCP: Rusty Aus, MD  DOS: 09/14/2020  Note by: Gillis Santa, MD  Service setting: Ambulatory outpatient  Specialty: Interventional Pain Management  Patient type: Established  Location: [facility] (AMB) Pain Management Facility  Visit type: Interventional Procedure   Primary Reason for Visit: Interventional Pain Management Treatment. CC: Knee Pain (left)  Procedure:          Anesthesia, Analgesia, Anxiolysis:  Type: Genicular Nerves Block (Superior-lateral, Superior-medial, and Inferior-medial Genicular Nerves) #2 (previously done 06/09/2018)  CPT: 71696      Primary Purpose: Therapeutic Region: Lateral, Anterior, and Medial aspects of the knee joint, above and below the knee joint proper. Level: Superior and inferior to the knee joint. Target Area: For Genicular Nerve block(s), the targets are: the superior-lateral genicular nerve, located in the lateral distal portion of the femoral shaft as it curves to form the lateral epicondyle, in the region of the distal femoral metaphysis; the superior-medial genicular nerve, located in the medial distal portion of the femoral shaft as it curves to form the medial epicondyle; and the inferior-medial genicular nerve, located in the medial, proximal portion of the tibial shaft, as it curves to form the medial epicondyle, in the region of the proximal tibial metaphysis. Approach: Anterior, percutaneous, ipsilateral approach. Laterality: Left knee Position: Modified Fowler's position with pillows under the targeted knee(s).  Type: Local Anesthesia Indication(s): Analgesia         Route: Infiltration ([place]/IM) IV Access: Declined Sedation: Declined  Local Anesthetic: Lidocaine 1-2%   Indications: 1. Primary osteoarthritis of left knee   2. Chronic pain syndrome    Pain Score: Pre-procedure: 2 /10 Post-procedure: 0-No pain/10  Pre-op  Assessment:  Ricardo Winters is a 74 y.o. (year old), male patient, seen today for interventional treatment. He  has a past surgical history that includes Shoulder acromioplasty and Colonoscopy with propofol (N/A, 12/12/2015). Ricardo Winters has a current medication list which includes the following prescription(s): vitamin c, aspirin ec, black pepper-turmeric, vitamin d3, collagen, ezetimibe, grape seed, losartan, nitroglycerin, NON FORMULARY, omega-3, pravastatin, sildenafil, vitamin b-12, and zinc sulfate. His primarily concern today is the Knee Pain (left)  Initial Vital Signs:  Pulse/HCG Rate: (!) 55ECG Heart Rate: (!) 53 Temp: (!) 96.7 F (35.9 C) Resp: 18 BP: (!) 171/92 SpO2: 99 %  BMI: Estimated body mass index is 31.19 kg/m as calculated from the following:   Height as of this encounter: 6' (1.829 m).   Weight as of this encounter: 230 lb (104.3 kg).  Risk Assessment: Allergies: Reviewed. He has No Known Allergies.  Allergy Precautions: None required Coagulopathies: Reviewed. None identified.  Blood-thinner therapy: None at this time Active Infection(s): Reviewed. None identified. Ricardo Winters is afebrile  Site Confirmation: Ricardo Winters was asked to confirm the procedure and laterality before marking the site Procedure checklist: Completed Consent: Before the procedure and under the influence of no sedative(s), amnesic(s), or anxiolytics, the patient was informed of the treatment options, risks and possible complications. To fulfill our ethical and legal obligations, as recommended by the American Medical Association's Code of Ethics, I have informed the patient of my clinical impression; the nature and purpose of the treatment or procedure; the risks, benefits, and possible complications of the intervention; the alternatives, including doing nothing; the risk(s) and benefit(s) of the alternative treatment(s) or procedure(s); and the risk(s) and benefit(s) of doing nothing. The patient was  provided information  about the general risks and possible complications associated with the procedure. These may include, but are not limited to: failure to achieve desired goals, infection, bleeding, organ or nerve damage, allergic reactions, paralysis, and death. In addition, the patient was informed of those risks and complications associated to the procedure, such as failure to decrease pain; infection; bleeding; organ or nerve damage with subsequent damage to sensory, motor, and/or autonomic systems, resulting in permanent pain, numbness, and/or weakness of one or several areas of the body; allergic reactions; (i.e.: anaphylactic reaction); and/or death. Furthermore, the patient was informed of those risks and complications associated with the medications. These include, but are not limited to: allergic reactions (i.e.: anaphylactic or anaphylactoid reaction(s)); adrenal axis suppression; blood sugar elevation that in diabetics may result in ketoacidosis or comma; water retention that in patients with history of congestive heart failure may result in shortness of breath, pulmonary edema, and decompensation with resultant heart failure; weight gain; swelling or edema; medication-induced neural toxicity; particulate matter embolism and blood vessel occlusion with resultant organ, and/or nervous system infarction; and/or aseptic necrosis of one or more joints. Finally, the patient was informed that Medicine is not an exact science; therefore, there is also the possibility of unforeseen or unpredictable risks and/or possible complications that may result in a catastrophic outcome. The patient indicated having understood very clearly. We have given the patient no guarantees and we have made no promises. Enough time was given to the patient to ask questions, all of which were answered to the patient's satisfaction. Ricardo Winters has indicated that he wanted to continue with the procedure. Attestation: I, the  ordering provider, attest that I have discussed with the patient the benefits, risks, side-effects, alternatives, likelihood of achieving goals, and potential problems during recovery for the procedure that I have provided informed consent. Date  Time: 09/14/2020  8:23 AM  Pre-Procedure Preparation:  Monitoring: As per clinic protocol. Respiration, ETCO2, SpO2, BP, heart rate and rhythm monitor placed and checked for adequate function Safety Precautions: Patient was assessed for positional comfort and pressure points before starting the procedure. Time-out: I initiated and conducted the "Time-out" before starting the procedure, as per protocol. The patient was asked to participate by confirming the accuracy of the "Time Out" information. Verification of the correct person, site, and procedure were performed and confirmed by me, the nursing staff, and the patient. "Time-out" conducted as per Joint Commission's Universal Protocol (UP.01.01.01). Time: 1761  Description of Procedure:          Area Prepped: Entire knee area, from mid-thigh to mid-shin, lateral, anterior, and medial aspects. Prepping solution: ChloraPrep (2% chlorhexidine gluconate and 70% isopropyl alcohol) Safety Precautions: Aspiration looking for blood return was conducted prior to all injections. At no point did we inject any substances, as a needle was being advanced. No attempts were made at seeking any paresthesias. Safe injection practices and needle disposal techniques used. Medications properly checked for expiration dates. SDV (single dose vial) medications used. Latex Allergy precautions taken.   Description of the Procedure: Protocol guidelines were followed. The patient was placed in position over the procedure table. The target area was identified and the area prepped in the usual manner. Skin & deeper tissues infiltrated with local anesthetic. Appropriate amount of time allowed to pass for local anesthetics to take effect.  The procedure needles were then advanced to the target area. Proper needle placement secured. Negative aspiration confirmed. Solution injected in intermittent fashion, asking for systemic symptoms every 0.5cc of injectate. The  needles were then removed and the area cleansed, making sure to leave some of the prepping solution back to take advantage of its long term bactericidal properties.  Vitals:   09/14/20 0825 09/14/20 0849 09/14/20 0853 09/14/20 0856  BP: (!) 171/92 (!) 174/90 (!) 173/92 (!) 173/92  Pulse: (!) 55     Resp: 18 12 14 17   Temp: (!) 96.7 F (35.9 C)     TempSrc: Temporal     SpO2:  99% 99% 98%  Weight: 230 lb (104.3 kg)     Height: 6' (1.829 m)       Start Time: 0848 hrs. End Time: 0855 hrs. Materials:  Needle(s) Type: Regular needle Gauge: 22G Length: 3.5-in Medication(s): Please see orders for medications and dosing details. 6 cc solution containing 5 cc of 0.2% ropivacaine, 1 cc of Decadron 10 mg/cc.  2 cc injected at each genicular nerve above. Imaging Guidance (Non-Spinal):          Type of Imaging Technique: Fluoroscopy Guidance (Non-Spinal) Indication(s): Assistance in needle guidance and placement for procedures requiring needle placement in or near specific anatomical locations not easily accessible without such assistance. Exposure Time: Please see nurses notes. Contrast: Before injecting any contrast, we confirmed that the patient did not have an allergy to iodine, shellfish, or radiological contrast. Once satisfactory needle placement was completed at the desired level, radiological contrast was injected. Contrast injected under live fluoroscopy. No contrast complications. See chart for type and volume of contrast used. Fluoroscopic Guidance: I was personally present during the use of fluoroscopy. "Tunnel Vision Technique" used to obtain the best possible view of the target area. Parallax error corrected before commencing the procedure.  "Direction-depth-direction" technique used to introduce the needle under continuous pulsed fluoroscopy. Once target was reached, antero-posterior, oblique, and lateral fluoroscopic projection used confirm needle placement in all planes. Images permanently stored in EMR. Interpretation: I personally interpreted the imaging intraoperatively. Adequate needle placement confirmed in multiple planes. Appropriate spread of contrast into desired area was observed. No evidence of afferent or efferent intravascular uptake. Permanent images saved into the patient's record.  Antibiotic Prophylaxis:   Anti-infectives (From admission, onward)   None     Indication(s): None identified  Post-operative Assessment:  Post-procedure Vital Signs:  Pulse/HCG Rate: (!) 55(!) 52 Temp: (!) 96.7 F (35.9 C) Resp: 17 BP: (!) 173/92 SpO2: 98 %  EBL: None  Complications: No immediate post-treatment complications observed by team, or reported by patient.  Note: The patient tolerated the entire procedure well. A repeat set of vitals were taken after the procedure and the patient was kept under observation following institutional policy, for this type of procedure. Post-procedural neurological assessment was performed, showing return to baseline, prior to discharge. The patient was provided with post-procedure discharge instructions, including a section on how to identify potential problems. Should any problems arise concerning this procedure, the patient was given instructions to immediately contact us, at any time, without hesitation. In any case, we plan to contact the patient by telephone for a follow-up status report regarding this interventional procedure.  Comments:  No additional relevant information.  Plan of Care    Imaging Orders     DG PAIN CLINIC C-ARM 1-60 MIN NO REPORT Procedure Orders    No procedure(s) ordered today    Medications ordered for procedure: Meds ordered this encounter    Medications  . lidocaine (XYLOCAINE) 2 % (with pres) injection 400 mg  . ropivacaine (PF) 2 mg/mL (0.2%) (NAROPIN) injection 9 mL  .  dexamethasone (DECADRON) injection 10 mg   Medications administered: We administered lidocaine, ropivacaine (PF) 2 mg/mL (0.2%), and dexamethasone.  See the medical record for exact dosing, route, and time of administration.  New Prescriptions   No medications on file   Disposition: Discharge home  Discharge Date & Time: 09/14/2020; 0905 hrs.   Physician-requested Follow-up: Return in about 8 weeks (around 11/09/2020) for Post Procedure Evaluation, virtual.  Future Appointments  Date Time Provider [place]  09/19/2020 11:15 AM Ralene Bathe, MD [facility] None  09/21/2020  9:45 AM Criselda Peaches, DPM [facility]  11/09/2020  3:20 PM Gillis Santa, MD [facility] None  01/09/2021  9:00 AM Minna Merritts, MD [facility]   Primary Care Physician: Rusty Aus, MD Location: [facility] Note by: Gillis Santa, MD Date: 09/14/2020; Time: 9:22 AM  Disclaimer:  Medicine is not an exact science. The only guarantee in medicine is that nothing is guaranteed. It is important to note that the decision to proceed with this intervention was based on the information collected from the patient. The Data and conclusions were drawn from the patient's questionnaire, the interview, and the physical examination. Because the information was provided in large part by the patient, it cannot be guaranteed that it has not been purposely or unconsciously manipulated. Every effort has been made to obtain as much relevant data as possible for this evaluation. It is important to note that the conclusions that lead to this procedure are derived in large part from the available data. Always take into account that the treatment will also be dependent on availability of resources and existing treatment guidelines, considered by  other Pain Management Practitioners as being common knowledge and practice, at the time of the intervention. For Medico-Legal purposes, it is also important to point out that variation in procedural techniques and pharmacological choices are the acceptable norm. The indications, contraindications, technique, and results of the above procedure should only be interpreted and judged by a Board-Certified Interventional Pain Specialist with extensive familiarity and expertise in the same exact procedure and technique.

## 2020-09-14 NOTE — Patient Instructions (Signed)

## 2020-09-14 NOTE — Progress Notes (Signed)
Safety precautions to be maintained throughout the outpatient stay will include: orient to surroundings, keep bed in low position, maintain call bell within reach at all times, provide assistance with transfer out of bed and ambulation.  

## 2020-09-15 ENCOUNTER — Telehealth: Payer: Self-pay | Admitting: *Deleted

## 2020-09-15 NOTE — Telephone Encounter (Signed)
Spoke with patient re; procedure on yesterday, states that around 1530 on yesterday he had pain down the shin for approx 1 hour and then it went away and everything has been good since then.

## 2020-09-19 ENCOUNTER — Other Ambulatory Visit: Payer: Self-pay

## 2020-09-19 ENCOUNTER — Ambulatory Visit: Payer: Medicare Other | Admitting: Dermatology

## 2020-09-19 ENCOUNTER — Encounter: Payer: Self-pay | Admitting: Dermatology

## 2020-09-19 DIAGNOSIS — D692 Other nonthrombocytopenic purpura: Secondary | ICD-10-CM

## 2020-09-19 DIAGNOSIS — L578 Other skin changes due to chronic exposure to nonionizing radiation: Secondary | ICD-10-CM

## 2020-09-19 DIAGNOSIS — L814 Other melanin hyperpigmentation: Secondary | ICD-10-CM

## 2020-09-19 DIAGNOSIS — D1800 Hemangioma unspecified site: Secondary | ICD-10-CM

## 2020-09-19 DIAGNOSIS — I872 Venous insufficiency (chronic) (peripheral): Secondary | ICD-10-CM | POA: Diagnosis not present

## 2020-09-19 DIAGNOSIS — L821 Other seborrheic keratosis: Secondary | ICD-10-CM

## 2020-09-19 DIAGNOSIS — Z1283 Encounter for screening for malignant neoplasm of skin: Secondary | ICD-10-CM | POA: Diagnosis not present

## 2020-09-19 DIAGNOSIS — L820 Inflamed seborrheic keratosis: Secondary | ICD-10-CM

## 2020-09-19 DIAGNOSIS — D229 Melanocytic nevi, unspecified: Secondary | ICD-10-CM

## 2020-09-19 NOTE — Progress Notes (Signed)
   Follow-Up Visit   Subjective  Ricardo Winters. is a 74 y.o. male who presents for the following: Annual Exam. The patient presents for Total-Body Skin Exam (TBSE) for skin cancer screening and mole check. Patient has irritated skin lesions that rub on his clothing around the neck, chest, and back.  The following portions of the chart were reviewed this encounter and updated as appropriate:  Tobacco  Allergies  Meds  Problems  Med Hx  Surg Hx  Fam Hx     Review of Systems:  No other skin or systemic complaints except as noted in HPI or Assessment and Plan.  Objective  Well appearing patient in no apparent distress; mood and affect are within normal limits.  A full examination was performed including scalp, head, eyes, ears, nose, lips, neck, chest, axillae, abdomen, back, buttocks, bilateral upper extremities, bilateral lower extremities, hands, feet, fingers, toes, fingernails, and toenails. All findings within normal limits unless otherwise noted below.  Objective  neck x 16, scalp x 2,  R leg x 2, chest x 12, L side x 1 (33): Erythematous keratotic or waxy stuck-on papule or plaque.   Objective  B/L leg: Stasis changes  Assessment & Plan  Inflamed seborrheic keratosis (33) neck x 16, scalp x 2,  R leg x 2, chest x 12, L side x 1  Destruction of lesion - neck x 16, scalp x 2,  R leg x 2, chest x 12, L side x 1 Complexity: simple   Destruction method: cryotherapy   Informed consent: discussed and consent obtained   Timeout:  patient name, date of birth, surgical site, and procedure verified Lesion destroyed using liquid nitrogen: Yes   Region frozen until ice ball extended beyond lesion: Yes   Outcome: patient tolerated procedure well with no complications   Post-procedure details: wound care instructions given    Stasis dermatitis of both legs B/L leg  Benign appearing, observe.   Lentigines - Scattered tan macules - Discussed due to sun exposure -  Benign, observe - Call for any changes  Seborrheic Keratoses - Stuck-on, waxy, tan-brown papules and plaques  - Discussed benign etiology and prognosis. - Observe - Call for any changes  Melanocytic Nevi - Tan-brown and/or pink-flesh-colored symmetric macules and papules - Benign appearing on exam today - Observation - Call clinic for new or changing moles - Recommend daily use of broad spectrum spf 30+ sunscreen to sun-exposed areas.   Hemangiomas - Red papules - Discussed benign nature - Observe - Call for any changes  Actinic Damage - Chronic, secondary to cumulative UV/sun exposure - diffuse scaly erythematous macules with underlying dyspigmentation - Recommend daily broad spectrum sunscreen SPF 30+ to sun-exposed areas, reapply every 2 hours as needed.  - Call for new or changing lesions.  Purpura - Chronic; persistent and recurrent.  Treatable, but not curable. - Violaceous macules and patches - Benign - Related to age, sun damage and/or use of blood thinners - Observe - Can use OTC arnica containing moisturizer such as Dermend Bruise Formula if desired - Call for worsening or other concerns  Skin cancer screening performed today.  Return in about 1 year (around 09/19/2021) for TBSE.  Luther Redo, CMA, am acting as scribe for Sarina Ser, MD .  Documentation: I have reviewed the above documentation for accuracy and completeness, and I agree with the above.  Sarina Ser, MD

## 2020-09-21 ENCOUNTER — Ambulatory Visit: Payer: Medicare Other | Admitting: Podiatry

## 2020-09-27 ENCOUNTER — Encounter: Payer: Self-pay | Admitting: Dermatology

## 2020-10-03 ENCOUNTER — Telehealth: Payer: Medicare Other | Admitting: Pain Medicine

## 2020-11-05 DIAGNOSIS — H10222 Pseudomembranous conjunctivitis, left eye: Secondary | ICD-10-CM | POA: Diagnosis not present

## 2020-11-08 MED ORDER — LOSARTAN POTASSIUM 50 MG PO TABS: 50.0000 mg | ORAL_TABLET | Freq: Every day | ORAL | 1 refills | Status: DC

## 2020-11-08 MED ORDER — EZETIMIBE 10 MG PO TABS
10.0000 mg | ORAL_TABLET | Freq: Every day | ORAL | 1 refills | Status: DC
Start: 2020-11-08 — End: 2022-01-18

## 2020-11-08 MED ORDER — PRAVASTATIN SODIUM 20 MG PO TABS
20.0000 mg | ORAL_TABLET | Freq: Every day | ORAL | 1 refills | Status: DC
Start: 2020-11-08 — End: 2022-01-18

## 2020-11-09 ENCOUNTER — Other Ambulatory Visit: Payer: Self-pay

## 2020-11-09 ENCOUNTER — Ambulatory Visit
Payer: PPO | Attending: Student in an Organized Health Care Education/Training Program | Admitting: Student in an Organized Health Care Education/Training Program

## 2020-11-09 ENCOUNTER — Encounter: Payer: Self-pay | Admitting: Student in an Organized Health Care Education/Training Program

## 2020-11-09 DIAGNOSIS — M1712 Unilateral primary osteoarthritis, left knee: Secondary | ICD-10-CM

## 2020-11-09 DIAGNOSIS — G894 Chronic pain syndrome: Secondary | ICD-10-CM | POA: Diagnosis not present

## 2020-11-09 NOTE — Progress Notes (Signed)
Patient: Ricardo Winters.  Service Category: E/M  Provider: Gillis Santa, MD  DOB: Aug 26, 1946  DOS: 11/09/2020  Location: Office  MRN: [id number]  Setting: Ambulatory outpatient  Referring Provider: Rusty Aus, MD  Type: Established Patient  Specialty: Interventional Pain Management  PCP: Ricardo Aus, MD  Location: Home  Delivery: TeleHealth     Virtual Encounter - Pain Management PROVIDER NOTE: Information contained herein reflects review and annotations entered in association with encounter. Interpretation of such information and data should be left to medically-trained personnel. Information provided to patient can be located elsewhere in the medical record under "Patient Instructions". Document created using STT-dictation technology, any transcriptional errors that may result from process are unintentional.    Contact & Pharmacy Preferred: [phone] Home: [phone] (home) Mobile: [phone] (mobile) E-mail: [email or web address] [phone] Ricardo Winters, [address] [place] [address] Phone: [phone] Fax: [phone]  Ricardo Winters, Ricardo Winters [address] [place] [address] Phone: [phone] Fax: [phone]   Pre-screening  Ricardo Winters offered "in-person" vs "virtual" encounter. He indicated preferring virtual for this encounter.   Reason COVID-19*  Social distancing based on CDC and AMA recommendations.   I contacted Ricardo Winters. on 11/09/2020 via telephone.      I clearly identified myself as Ricardo Santa, MD. I verified that I was speaking with the correct person using two identifiers (Name: Ricardo Gatling., and date of birth: 06/19/1946).  Consent I sought verbal advanced consent from Ricardo Winters. for virtual visit interactions. I informed Ricardo Winters of possible security and privacy concerns, risks, and limitations associated with  providing "not-in-person" medical evaluation and management services. I also informed Ricardo Winters of the availability of "in-person" appointments. Finally, I informed him that there would be a charge for the virtual visit and that he could be  personally, fully or partially, financially responsible for it. Ricardo Winters expressed understanding and agreed to proceed.   Historic Elements   [name] is a 75 y.o. year old, male patient evaluated today after our last contact on 09/14/2020. Ricardo Winters  has a past medical history of Anginal pain ([place]), Cancer [facility]), Coronary artery disease, COVID-19, Edema, Hypertension, Myocardial infarction ([place]), and Obesity. He also  has a past surgical history that includes Shoulder acromioplasty and Colonoscopy with propofol (N/A, 12/12/2015). Ricardo Winters has a current medication list which includes the following prescription(s): vitamin c, aspirin ec, black pepper-turmeric, vitamin d3, collagen, ezetimibe, grape seed, losartan, nitroglycerin, NON FORMULARY, omega-3, pravastatin, sildenafil, vitamin b-12, and zinc sulfate. He  reports that he has never smoked. He has never used smokeless tobacco. He reports current alcohol use of about 4.0 standard drinks of alcohol per week. He reports that he does not use drugs. Ricardo Winters has No Known Allergies.   HPI  Today, he is being contacted for a post-procedure assessment.   Post-Procedure Evaluation  Procedure (09/14/2020):   Type: Genicular Nerves Block (Superior-lateral, Superior-medial, and Inferior-medial Genicular Nerves) #2 (previously done 06/09/2018)  CPT: 73428      Primary Purpose: Therapeutic Region: Lateral, Anterior, and Medial aspects of the knee joint, above and below the knee joint proper. Level: Superior and inferior to the knee joint. Target Area: For Genicular Nerve block(s), the targets are: the superior-lateral genicular nerve, located in the lateral distal portion of the  femoral shaft as it  curves to form the lateral epicondyle, in the region of the distal femoral metaphysis; the superior-medial genicular nerve, located in the medial distal portion of the femoral shaft as it curves to form the medial epicondyle; and the inferior-medial genicular nerve, located in the medial, proximal portion of the tibial shaft, as it curves to form the medial epicondyle, in the region of the proximal tibial metaphysis. Approach: Anterior, percutaneous, ipsilateral approach. Laterality: Left knee Position: Modified Fowler's position with pillows under the targeted knee(s).  Sedation: Please see nurses note.  Effectiveness during initial hour after procedure(Ultra-Short Term Relief): 100 %   Local anesthetic used: Long-acting (4-6 hours) Effectiveness: Defined as any analgesic benefit obtained secondary to the administration of local anesthetics. This carries significant diagnostic value as to the etiological location, or anatomical origin, of the pain. Duration of benefit is expected to coincide with the duration of the local anesthetic used.  Effectiveness during initial 4-6 hours after procedure(Short-Term Relief): 100 %   Long-term benefit: Defined as any relief past the pharmacologic duration of the local anesthetics.  Effectiveness past the initial 6 hours after procedure(Long-Term Relief): 75 % (when he is going to be very active wears a knee brace on left.  still has pain on the inside of the knee.  pain is not severe can be managed with Aleve.)   Current benefits: Defined as benefit that persist at this time.   Analgesia:  50% improved Function: Somewhat improved ROM: Somewhat improved  Laboratory Chemistry Profile   Renal Lab Results  Component Value Date   BUN 20 02/21/2020   CREATININE 1.09 02/21/2020   GFRAA >60 02/21/2020   GFRNONAA >60 02/21/2020     Hepatic Lab Results  Component Value Date   AST 18 03/10/2018   ALT 16 (L) 03/10/2018   ALBUMIN 3.2 (L) 03/10/2018    ALKPHOS 57 03/10/2018     Electrolytes Lab Results  Component Value Date   NA 143 02/21/2020   K 4.0 02/21/2020   CL 108 02/21/2020   CALCIUM 9.3 02/21/2020     Bone No results found for: VD25OH, VD125OH2TOT, YP9509TO6, ZT2458KD9, 25OHVITD1, 25OHVITD2, 25OHVITD3, TESTOFREE, TESTOSTERONE   Inflammation (CRP: Acute Phase) (ESR: Chronic Phase) No results found for: CRP, ESRSEDRATE, LATICACIDVEN     Note: Above Lab results reviewed.   Assessment  The primary encounter diagnosis was Primary osteoarthritis of left knee. A diagnosis of Chronic pain syndrome was also pertinent to this visit.  Plan of Care   Orders:  Orders Placed This Encounter  Procedures  . GENICULAR NERVE BLOCK    For knee pain.    Standing Status:   Standing    Number of Occurrences:   1    Standing Expiration Date:   11/09/2021    Scheduling Instructions:     Side(s): LEFT     Level(s): Superior-Lateral, Superior-Medial, and Inferior-Medial Genicular Nerves     Sedation: Patient's choice.     TIMEFRAME: PRN procedure. (Mr. Schiff will call when needed.)    Order Specific Question:   Where will this procedure be performed?    Answer:   [facility]   Follow-up plan:   Return if symptoms worsen or fail to improve.     Status post left L5-S1 ESI #1 on 05/06/2019, and #2 on 06/17/2019 as well as left sacroiliac joint injection #1 on 06/17/2019: Right SI joint injection, right piriformis injection on 04/20/2020-helped significantly; LEFT SI joint injection, LEFT piriformis injection on 05/30/20 helped  significantly, repeat as needed.  08/08/2020: Diagnostic bilateral lumbar facet medial branch nerve blocks at L3, L4, L5.  Bilateral sacroiliac joint injection      Recent Visits Date Type Provider Dept  09/14/20 Procedure visit Ricardo Santa, MD [facility]  Showing recent visits within past 90 days and meeting all other requirements Today's Visits Date Type Provider Dept  11/09/20 Telemedicine  Ricardo Santa, MD [facility]  Showing today's visits and meeting all other requirements Future Appointments No visits were found meeting these conditions. Showing future appointments within next 90 days and meeting all other requirements  I discussed the assessment and treatment plan with the patient. The patient was provided an opportunity to ask questions and all were answered. The patient agreed with the plan and demonstrated an understanding of the instructions.  Patient advised to call back or seek an in-person evaluation if the symptoms or condition worsens.  Duration of encounter: 2mnutes.  Note by: BGillis Santa MD Date: 11/09/2020; Time: 2:13 PM

## 2020-11-22 DIAGNOSIS — H2511 Age-related nuclear cataract, right eye: Secondary | ICD-10-CM | POA: Diagnosis not present

## 2020-11-22 DIAGNOSIS — I10 Essential (primary) hypertension: Secondary | ICD-10-CM | POA: Diagnosis not present

## 2020-11-23 ENCOUNTER — Other Ambulatory Visit: Payer: Self-pay

## 2020-11-23 ENCOUNTER — Encounter: Payer: Self-pay | Admitting: Ophthalmology

## 2020-11-28 ENCOUNTER — Other Ambulatory Visit
Admission: RE | Admit: 2020-11-28 | Discharge: 2020-11-28 | Disposition: A | Discharge status: Home / Self Care | Payer: PPO | Source: Ambulatory Visit | Attending: Ophthalmology | Admitting: Ophthalmology

## 2020-11-28 ENCOUNTER — Other Ambulatory Visit: Payer: Self-pay

## 2020-11-28 DIAGNOSIS — Z20822 Contact with and (suspected) exposure to covid-19: Secondary | ICD-10-CM | POA: Insufficient documentation

## 2020-11-28 DIAGNOSIS — Z01812 Encounter for preprocedural laboratory examination: Secondary | ICD-10-CM | POA: Insufficient documentation

## 2020-11-28 LAB — SARS CORONAVIRUS 2 (TAT 6-24 HRS): SARS Coronavirus 2: NEGATIVE

## 2020-11-28 NOTE — Discharge Instructions (Signed)

## 2020-11-30 ENCOUNTER — Other Ambulatory Visit: Payer: Self-pay

## 2020-11-30 ENCOUNTER — Ambulatory Visit: Payer: PPO | Admitting: Anesthesiology

## 2020-11-30 ENCOUNTER — Encounter: Payer: Self-pay | Admitting: Ophthalmology

## 2020-11-30 ENCOUNTER — Encounter
Admission: RE | Disposition: A | Discharge status: Home / Self Care | Payer: Self-pay | Source: Home / Self Care | Attending: Ophthalmology

## 2020-11-30 ENCOUNTER — Ambulatory Visit
Admission: RE | Admit: 2020-11-30 | Discharge: 2020-11-30 | Disposition: A | Discharge status: Home / Self Care | Payer: PPO | Attending: Ophthalmology | Admitting: Ophthalmology

## 2020-11-30 DIAGNOSIS — Z955 Presence of coronary angioplasty implant and graft: Secondary | ICD-10-CM | POA: Diagnosis not present

## 2020-11-30 DIAGNOSIS — H2511 Age-related nuclear cataract, right eye: Secondary | ICD-10-CM | POA: Diagnosis not present

## 2020-11-30 DIAGNOSIS — Z85828 Personal history of other malignant neoplasm of skin: Secondary | ICD-10-CM | POA: Insufficient documentation

## 2020-11-30 DIAGNOSIS — Z7982 Long term (current) use of aspirin: Secondary | ICD-10-CM | POA: Insufficient documentation

## 2020-11-30 DIAGNOSIS — Z79899 Other long term (current) drug therapy: Secondary | ICD-10-CM | POA: Insufficient documentation

## 2020-11-30 DIAGNOSIS — H25811 Combined forms of age-related cataract, right eye: Secondary | ICD-10-CM | POA: Diagnosis not present

## 2020-11-30 DIAGNOSIS — Z96649 Presence of unspecified artificial hip joint: Secondary | ICD-10-CM | POA: Insufficient documentation

## 2020-11-30 DIAGNOSIS — Z8616 Personal history of COVID-19: Secondary | ICD-10-CM | POA: Insufficient documentation

## 2020-11-30 HISTORY — DX: Asymptomatic varicose veins of bilateral lower extremities: I83.93

## 2020-11-30 HISTORY — PX: CATARACT EXTRACTION W/PHACO: SHX586

## 2020-11-30 SURGERY — PHACOEMULSIFICATION, CATARACT, WITH IOL INSERTION
Anesthesia: Monitor Anesthesia Care | Site: Eye | Laterality: Right

## 2020-11-30 MED ORDER — TETRACAINE HCL 0.5 % OP SOLN
1.0000 [drp] | OPHTHALMIC | Status: DC | PRN
  Administered 2020-11-30 (×3): 1 [drp] via OPHTHALMIC

## 2020-11-30 MED ORDER — ARMC OPHTHALMIC DILATING DROPS
1.0000 "application " | OPHTHALMIC | Status: DC | PRN
  Administered 2020-11-30 (×3): 1 via OPHTHALMIC

## 2020-11-30 MED ORDER — LACTATED RINGERS IV SOLN: INTRAVENOUS | Status: DC

## 2020-11-30 MED ORDER — LIDOCAINE HCL (PF) 2 % IJ SOLN
INTRAOCULAR | Status: DC | PRN
  Administered 2020-11-30: 1 mL

## 2020-11-30 MED ORDER — CEFUROXIME OPHTHALMIC INJECTION 1 MG/0.1 ML
INJECTION | OPHTHALMIC | Status: DC | PRN
  Administered 2020-11-30: 0.1 mL via INTRACAMERAL

## 2020-11-30 MED ORDER — BRIMONIDINE TARTRATE-TIMOLOL 0.2-0.5 % OP SOLN
OPHTHALMIC | Status: DC | PRN
  Administered 2020-11-30: 1 [drp] via OPHTHALMIC

## 2020-11-30 MED ORDER — MIDAZOLAM HCL 2 MG/2ML IJ SOLN
INTRAMUSCULAR | Status: DC | PRN
  Administered 2020-11-30: 2 mg via INTRAVENOUS

## 2020-11-30 MED ORDER — EPINEPHRINE PF 1 MG/ML IJ SOLN
INTRAOCULAR | Status: DC | PRN
  Administered 2020-11-30: 74 mL via OPHTHALMIC

## 2020-11-30 MED ORDER — NA HYALUR & NA CHOND-NA HYALUR 0.4-0.35 ML IO KIT
PACK | INTRAOCULAR | Status: DC | PRN
  Administered 2020-11-30: 1 mL via INTRAOCULAR

## 2020-11-30 MED ORDER — FENTANYL CITRATE (PF) 100 MCG/2ML IJ SOLN
INTRAMUSCULAR | Status: DC | PRN
  Administered 2020-11-30: 100 ug via INTRAVENOUS

## 2020-11-30 SURGICAL SUPPLY — 23 items
CANNULA ANT/CHMB 27G (MISCELLANEOUS) ×1 IMPLANT
CANNULA ANT/CHMB 27GA (MISCELLANEOUS) ×2 IMPLANT
GLOVE SURG LX 7.5 STRW (GLOVE) ×1
GLOVE SURG LX STRL 7.5 STRW (GLOVE) ×1 IMPLANT
GLOVE SURG TRIUMPH 8.0 PF LTX (GLOVE) ×2 IMPLANT
GOWN STRL REUS W/ TWL LRG LVL3 (GOWN DISPOSABLE) ×2 IMPLANT
GOWN STRL REUS W/TWL LRG LVL3 (GOWN DISPOSABLE) ×4
LENS IOL DIOP 21.0 (Intraocular Lens) ×2 IMPLANT
LENS IOL TECNIS MONO 21.0 (Intraocular Lens) IMPLANT
MARKER SKIN DUAL TIP RULER LAB (MISCELLANEOUS) ×2 IMPLANT
NDL CAPSULORHEX 25GA (NEEDLE) ×1 IMPLANT
NDL FILTER BLUNT 18X1 1/2 (NEEDLE) ×2 IMPLANT
NEEDLE CAPSULORHEX 25GA (NEEDLE) ×2 IMPLANT
NEEDLE FILTER BLUNT 18X 1/2SAF (NEEDLE) ×2
NEEDLE FILTER BLUNT 18X1 1/2 (NEEDLE) ×2 IMPLANT
PACK CATARACT BRASINGTON (MISCELLANEOUS) ×2 IMPLANT
PACK EYE AFTER SURG (MISCELLANEOUS) ×2 IMPLANT
PACK OPTHALMIC (MISCELLANEOUS) ×2 IMPLANT
SOLUTION OPHTHALMIC SALT (MISCELLANEOUS) ×2 IMPLANT
SYR 3ML LL SCALE MARK (SYRINGE) ×4 IMPLANT
SYR TB 1ML LUER SLIP (SYRINGE) ×2 IMPLANT
WATER STERILE IRR 250ML POUR (IV SOLUTION) ×2 IMPLANT
WIPE NON LINTING 3.25X3.25 (MISCELLANEOUS) ×2 IMPLANT

## 2020-11-30 NOTE — Anesthesia Preprocedure Evaluation (Signed)
Anesthesia Evaluation  Patient identified by MRN, date of birth, ID band Patient awake    Reviewed: Allergy & Precautions, H&P , NPO status , Patient's Chart, lab work & pertinent test results, reviewed documented beta blocker date and time   Airway Mallampati: II  TM Distance: >3 FB Neck ROM: full    Dental no notable dental hx.    Pulmonary neg pulmonary ROS,    Pulmonary exam normal breath sounds clear to auscultation       Cardiovascular Exercise Tolerance: Good hypertension, + CAD and + Past MI   Rhythm:regular Rate:Normal     Neuro/Psych negative neurological ROS  negative psych ROS   GI/Hepatic negative GI ROS, Neg liver ROS,   Endo/Other  negative endocrine ROS  Renal/GU negative Renal ROS  negative genitourinary   Musculoskeletal   Abdominal   Peds  Hematology negative hematology ROS (+)   Anesthesia Other Findings   Reproductive/Obstetrics negative OB ROS                             Anesthesia Physical Anesthesia Plan  ASA: III  Anesthesia Plan: MAC   Post-op Pain Management:    Induction:   PONV Risk Score and Plan: 1 and Treatment may vary due to age or medical condition  Airway Management Planned:   Additional Equipment:   Intra-op Plan:   Post-operative Plan:   Informed Consent: I have reviewed the patients History and Physical, chart, labs and discussed the procedure including the risks, benefits and alternatives for the proposed anesthesia with the patient or authorized representative who has indicated his/her understanding and acceptance.     Dental Advisory Given  Plan Discussed with: CRNA  Anesthesia Plan Comments:         Anesthesia Quick Evaluation

## 2020-11-30 NOTE — Transfer of Care (Signed)
Immediate Anesthesia Transfer of Care Note  Patient: Ricardo Winters.  Procedure(s) Performed: CATARACT EXTRACTION PHACO AND INTRAOCULAR LENS PLACEMENT (IOC)  RIGHT 7.70 01:03.4 12.1% (Right Eye)  Patient Location: PACU  Anesthesia Type: MAC  Level of Consciousness: awake, alert  and patient cooperative  Airway and Oxygen Therapy: Patient Spontanous Breathing and Patient connected to supplemental oxygen  Post-op Assessment: Post-op Vital signs reviewed, Patient's Cardiovascular Status Stable, Respiratory Function Stable, Patent Airway and No signs of Nausea or vomiting  Post-op Vital Signs: Reviewed and stable  Complications: No complications documented.

## 2020-11-30 NOTE — Anesthesia Postprocedure Evaluation (Signed)
Anesthesia Post Note  Patient: Ricardo Winters.  Procedure(s) Performed: CATARACT EXTRACTION PHACO AND INTRAOCULAR LENS PLACEMENT (IOC)  RIGHT 7.70 01:03.4 12.1% (Right Eye)     Patient location during evaluation: PACU Anesthesia Type: MAC Level of consciousness: awake and alert Pain management: pain level controlled Vital Signs Assessment: post-procedure vital signs reviewed and stable Respiratory status: spontaneous breathing, nonlabored ventilation, respiratory function stable and patient connected to nasal cannula oxygen Cardiovascular status: stable and blood pressure returned to baseline Postop Assessment: no apparent nausea or vomiting Anesthetic complications: no   No complications documented.  Alisa Graff

## 2020-11-30 NOTE — Anesthesia Procedure Notes (Signed)
Procedure Name: MAC Date/Time: 11/30/2020 9:46 AM Performed by: Silvana Newness, CRNA Pre-anesthesia Checklist: Patient identified, Emergency Drugs available, Suction available, Patient being monitored and Timeout performed Patient Re-evaluated:Patient Re-evaluated prior to induction Oxygen Delivery Method: Nasal cannula Placement Confirmation: positive ETCO2

## 2020-11-30 NOTE — Op Note (Signed)
LOCATION:  [place]   PREOPERATIVE DIAGNOSIS:    Nuclear sclerotic cataract right eye. H25.11   POSTOPERATIVE DIAGNOSIS:  Nuclear sclerotic cataract right eye.     PROCEDURE:  Phacoemusification with posterior chamber intraocular lens placement of the right eye   ULTRASOUND TIME: Procedure(s) with comments: CATARACT EXTRACTION PHACO AND INTRAOCULAR LENS PLACEMENT (IOC)  RIGHT 7.70 01:03.4 12.1% (Right) - requests early  LENS:   Implant Name Type Inv. Item Serial No. Manufacturer Lot No. LRB No. Used Action  LENS IOL DIOP 21.0 - [id number] Intraocular Lens LENS IOL DIOP 21.0 [id number]   Right 1 Implanted         SURGEON:  Wyonia Hough, MD   ANESTHESIA:  Topical with tetracaine drops and 2% Xylocaine jelly, augmented with 1% preservative-free intracameral lidocaine.    COMPLICATIONS:  None.   DESCRIPTION OF PROCEDURE:  The patient was identified in the holding room and transported to the operating room and placed in the supine position under the operating microscope.  The right eye was identified as the operative eye and it was prepped and draped in the usual sterile ophthalmic fashion.   A 1 millimeter clear-corneal paracentesis was made at the 12:00 position.  0.5 ml of preservative-free 1% lidocaine was injected into the anterior chamber. The anterior chamber was filled with Viscoat viscoelastic.  A 2.4 millimeter keratome was used to make a near-clear corneal incision at the 9:00 position.  A curvilinear capsulorrhexis was made with a cystotome and capsulorrhexis forceps.  Balanced salt solution was used to hydrodissect and hydrodelineate the nucleus.   Phacoemulsification was then used in stop and chop fashion to remove the lens nucleus and epinucleus.  The remaining cortex was then removed using the irrigation and aspiration handpiece. Provisc was then placed into the capsular bag to distend it for lens placement.  A lens was then injected into the  capsular bag.  The remaining viscoelastic was aspirated.   Wounds were hydrated with balanced salt solution.  The anterior chamber was inflated to a physiologic pressure with balanced salt solution.  No wound leaks were noted. Cefuroxime 0.1 ml of a 10mg /ml solution was injected into the anterior chamber for a dose of 1 mg of intracameral antibiotic at the completion of the case.   Timolol and Brimonidine drops were applied to the eye.  The patient was taken to the recovery room in stable condition without complications of anesthesia or surgery.   Ricardo Winters 11/30/2020, 10:03 AM

## 2020-11-30 NOTE — H&P (Signed)

## 2020-12-01 ENCOUNTER — Encounter: Payer: Self-pay | Admitting: Ophthalmology

## 2021-01-08 NOTE — Progress Notes (Signed)
Cardiology Office Note  Date:  01/09/2021   ID:  Ricardo Winters., DOB [birth year], MRN [id number]  PCP:  Rusty Aus, MD   Chief Complaint  Patient presents with  . Follow-up    6 month F/U    HPI:  [name] is a 75 year old gentleman with history of CAD, status post PCI of the RCA in 07/2014 after suffering a ST elevation myocardial infarction. bradycardia,  Hypertension, hyperlipidemia,  h/o DVT, 2012 h/o COVID-19 infection,  osteoarthritis,  elevated prostate antigen, PVD  lumbar radiculopathy  s/p spinal injections.  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   Myoview was unremarkable and revealed no evidence of stress-induced myocardial ischemia.   ER 01/2020 Nonexertional chest pain enz neg  Er 03/10/2018 Syncope on lisinopril/hydrochlorothiazide at 20/12.5 May have been dehydrated   PMH:   has a past medical history of Anginal pain ([name]), Cancer ([name]), Coronary artery disease, COVID-19, Edema, Hypertension, Myocardial infarction ([name]), Obesity, and Varicose veins of both lower extremities.  PSH:    Past Surgical History:  Procedure Laterality Date  . CARDIAC CATHETERIZATION  2015   stent  . CATARACT EXTRACTION W/PHACO Right 11/30/2020   Procedure: CATARACT EXTRACTION PHACO AND INTRAOCULAR LENS PLACEMENT (IOC)  RIGHT 7.70 01:03.4 12.1%;  Surgeon:  [name], MD;  Location: [place];  Service: Ophthalmology;  Laterality: Right;  requests early  . COLONOSCOPY WITH PROPOFOL N/A 12/12/2015   Procedure: COLONOSCOPY WITH PROPOFOL;  Surgeon: [name], MD;  Location[facility];  Service: Endoscopy;  Laterality: N/A;  . KNEE SURGERY    . SHOULDER ACROMIOPLASTY    . TOTAL HIP ARTHROPLASTY Bilateral    Right 2017, left 2020    Current Outpatient Medications  Medication Sig Dispense Refill  . Ascorbic Acid (VITAMIN C) 500 MG CAPS Take by mouth daily.    Marland Kitchen aspirin 81 MG tablet Take 1 tablet (81 mg total) by mouth daily.    Renard Hamper Pepper-Turmeric 3-500 MG CAPS Take by mouth daily.    Azucena Freed Serrata (BOSWELLIA PO) Take 250 mg by mouth daily.    . Cholecalciferol (VITAMIN D3) 1.25 MG (50000 UT) TABS Take 100 mcg by mouth daily.    . COLLAGEN PO Take 1 Scoop by mouth daily.    Marland Kitchen ezetimibe (ZETIA) 10 MG tablet Take 1 tablet (10 mg total) by mouth daily. 90 tablet 1  . Ferrous Gluconate (IRON 27 PO) Take by mouth daily.    Marland Kitchen GRAPE SEED CR PO Take by mouth daily.    Marland Kitchen losartan (COZAAR) 50 MG tablet Take 1 tablet (50 mg total) by mouth daily. 90 tablet 1  . nitroGLYCERIN (NITROSTAT) 0.4 MG SL tablet Place 0.4 mg under the tongue every 5 (five) minutes as needed for chest pain. Reported on 12/12/2015    . NON FORMULARY daily. cbd    . Omega-3 1000 MG CAPS Take  1,000 mg by mouth daily.    . pravastatin (PRAVACHOL) 20 MG tablet Take 1 tablet (20 mg total) by mouth daily. 90 tablet 1  . QUERCETIN PO Take 500 mg by mouth daily.    . sildenafil (REVATIO) 20 MG tablet Take 20 mg by mouth daily as needed (ED).     . vitamin B-12 (CYANOCOBALAMIN) 500 MCG tablet Take 1,000 mcg by mouth daily.    Marland Kitchen zinc sulfate 220 (50 Zn) MG capsule Take 50 mg by mouth daily.     No current facility-administered medications for this visit.     Allergies:   Patient has no known allergies.   Social History:  The patient  reports that  he has never smoked. He has never used smokeless tobacco. He reports current alcohol use of about 4.0 standard drinks of alcohol per week. He reports that he does not use drugs.   Family History:   family history includes Heart disease in his father; Obesity in his brother and brother.    Review of Systems: Review of Systems  Constitutional: Negative.   HENT: Negative.   Respiratory: Negative.   Cardiovascular: Positive for leg swelling.  Gastrointestinal: Negative.   Musculoskeletal: Negative.   Neurological: Negative.   Psychiatric/Behavioral: Negative.   All other systems reviewed and are negative.    PHYSICAL EXAM: VS:  BP (!) 160/90 (BP Location: Left Arm, Patient Position: Sitting, Cuff Size: Normal)   Pulse (!) 52   Ht 6' (1.829 m)   Wt 234 lb (106.1 kg)   SpO2 96%   BMI 31.74 kg/m  , BMI Body mass index is 31.74 kg/m. Constitutional:  oriented to person, place, and time. No distress.  HENT:  Head: Grossly normal Eyes:  no discharge. No scleral icterus.  Neck: No JVD, no carotid bruits  Cardiovascular: Regular rate and rhythm, no murmurs appreciated Pulmonary/Chest: Clear to auscultation bilaterally, no wheezes or rails Abdominal: Soft.  no distension.  no tenderness.  Musculoskeletal: Normal range of motion Neurological:  normal muscle tone. Coordination normal. No atrophy Skin: Skin warm and dry Psychiatric: normal affect, pleasant   Recent Labs: 02/21/2020: BUN 20; Creatinine, Ser 1.09; Hemoglobin 15.5; Platelets 168; Potassium 4.0; Sodium 143    Lipid Panel Lab Results  Component Value Date   CHOL 237 (H) 08/02/2014   HDL 56 08/02/2014   LDLCALC 142 (H) 08/02/2014   TRIG 197 08/02/2014      Wt Readings from Last 3 Encounters:  01/09/21 234 lb (106.1 kg)  11/30/20 231 lb (104.8 kg)  09/14/20 230 lb (104.3 kg)      ASSESSMENT AND PLAN:  Problem List Items Addressed This Visit      Cardiology Problems   Hyperlipidemia, mixed   Myocardial  infarction, inferior wall (HCC)   CAD (coronary artery disease) - Primary   HTN (hypertension)     Cad with stable angina stent to the RCA 2015 Prior stress test reviewed, no significant ischemia Ejection fraction 45% on outside study  Cardiomyopathy Outside echocardiogram ejection fraction 45%  losartan up to 100 daily  NSTEMI s/p PCI/DES,  Prior DES 2015, Aspirin to 81 mg daily Denies anginal symptoms  Bradycardia,  Asymptomatic Avoid b-blocker  H/o DVT, stable  recommend compression hose May need NOAC before trips  Hypertension, Increase the losartan up to 100 mg daily Was on cardura 4 mg in Jan 2019 If blood pressure runs high could potentially restart low-dose Cardura 1 mg up to 2 mg daily He will monitor at  home and call our office  Hyperlipidemia  Zetia 10 mg daily, pravastatin Labs with Dr. Sabra Heck  PVD, chronic, stable  followed by vascular Denies claudication symptoms No changes  Statin myalgias Still some cramping on pravastatin Potentially could try low-dose Crestor with Zetia He will check lipids first with Dr. Sabra Heck   Total encounter time more than 25 minutes  Greater than 50% was spent in counseling and coordination of care with the patient   Signed, Esmond Plants, M.D., Ph.D. [place], [place]

## 2021-01-09 ENCOUNTER — Ambulatory Visit: Payer: PPO | Admitting: Cardiovascular Disease

## 2021-01-09 ENCOUNTER — Encounter: Payer: Self-pay | Admitting: Cardiovascular Disease

## 2021-01-09 ENCOUNTER — Other Ambulatory Visit: Payer: Self-pay

## 2021-01-09 VITALS — BP 160/90 | HR 52 | Ht 72.0 in | Wt 234.0 lb

## 2021-01-09 DIAGNOSIS — I2119 ST elevation (STEMI) myocardial infarction involving other coronary artery of inferior wall: Secondary | ICD-10-CM

## 2021-01-09 DIAGNOSIS — M791 Myalgia, unspecified site: Secondary | ICD-10-CM | POA: Diagnosis not present

## 2021-01-09 DIAGNOSIS — I25118 Atherosclerotic heart disease of native coronary artery with other forms of angina pectoris: Secondary | ICD-10-CM | POA: Diagnosis not present

## 2021-01-09 DIAGNOSIS — T466X5A Adverse effect of antihyperlipidemic and antiarteriosclerotic drugs, initial encounter: Secondary | ICD-10-CM

## 2021-01-09 DIAGNOSIS — E782 Mixed hyperlipidemia: Secondary | ICD-10-CM

## 2021-01-09 DIAGNOSIS — I10 Essential (primary) hypertension: Secondary | ICD-10-CM

## 2021-01-09 MED ORDER — LOSARTAN POTASSIUM 100 MG PO TABS: 100.0000 mg | ORAL_TABLET | Freq: Every day | ORAL | 3 refills | Status: DC

## 2021-01-09 NOTE — Patient Instructions (Addendum)
Medication Instructions:  Please increase the losartan up to 100 mg daily Monitor pressure, call with numbers  If you need a refill on your cardiac medications before your next appointment, please call your pharmacy.    Lab work: No new labs needed   If you have labs (blood work) drawn today and your tests are completely normal, you will receive your results only by: Marland Kitchen MyChart Message (if you have MyChart) OR . A paper copy in the mail If you have any lab test that is abnormal or we need to change your treatment, we will call you to review the results.   Testing/Procedures: No new testing needed   Follow-Up: At [facility], you and your health needs are our priority.  As part of our continuing mission to provide you with exceptional heart care, we have created designated Provider Care Teams.  These Care Teams include your primary Cardiologist (physician) and Advanced Practice Providers (APPs -  Physician Assistants and Nurse Practitioners) who all work together to provide you with the care you need, when you need it.  . You will need a follow up appointment in 12 months  . Providers on your designated Care Team:   . Murray Hodgkins, NP . Christell Faith, PA-C . Marrianne Mood, PA-C  Any Other Special Instructions Will Be Listed Below (If Applicable).  COVID-19 Vaccine Information can be found at: [email or web address] For questions related to vaccine distribution or appointments, please email [email or web address] or call [phone].

## 2021-03-10 DIAGNOSIS — Z139 Encounter for screening, unspecified: Secondary | ICD-10-CM | POA: Diagnosis not present

## 2021-04-05 DIAGNOSIS — M9903 Segmental and somatic dysfunction of lumbar region: Secondary | ICD-10-CM | POA: Diagnosis not present

## 2021-04-05 DIAGNOSIS — M5417 Radiculopathy, lumbosacral region: Secondary | ICD-10-CM | POA: Diagnosis not present

## 2021-04-05 DIAGNOSIS — M5136 Other intervertebral disc degeneration, lumbar region: Secondary | ICD-10-CM | POA: Diagnosis not present

## 2021-04-05 DIAGNOSIS — M9905 Segmental and somatic dysfunction of pelvic region: Secondary | ICD-10-CM | POA: Diagnosis not present

## 2021-05-17 DIAGNOSIS — M9905 Segmental and somatic dysfunction of pelvic region: Secondary | ICD-10-CM | POA: Diagnosis not present

## 2021-05-17 DIAGNOSIS — M5136 Other intervertebral disc degeneration, lumbar region: Secondary | ICD-10-CM | POA: Diagnosis not present

## 2021-05-17 DIAGNOSIS — M5417 Radiculopathy, lumbosacral region: Secondary | ICD-10-CM | POA: Diagnosis not present

## 2021-05-17 DIAGNOSIS — M9903 Segmental and somatic dysfunction of lumbar region: Secondary | ICD-10-CM | POA: Diagnosis not present

## 2021-08-23 DIAGNOSIS — M5417 Radiculopathy, lumbosacral region: Secondary | ICD-10-CM | POA: Diagnosis not present

## 2021-08-23 DIAGNOSIS — M5136 Other intervertebral disc degeneration, lumbar region: Secondary | ICD-10-CM | POA: Diagnosis not present

## 2021-08-23 DIAGNOSIS — M9905 Segmental and somatic dysfunction of pelvic region: Secondary | ICD-10-CM | POA: Diagnosis not present

## 2021-08-23 DIAGNOSIS — M9903 Segmental and somatic dysfunction of lumbar region: Secondary | ICD-10-CM | POA: Diagnosis not present

## 2021-08-30 DIAGNOSIS — M5136 Other intervertebral disc degeneration, lumbar region: Secondary | ICD-10-CM | POA: Diagnosis not present

## 2021-08-30 DIAGNOSIS — M9905 Segmental and somatic dysfunction of pelvic region: Secondary | ICD-10-CM | POA: Diagnosis not present

## 2021-08-30 DIAGNOSIS — M5417 Radiculopathy, lumbosacral region: Secondary | ICD-10-CM | POA: Diagnosis not present

## 2021-08-30 DIAGNOSIS — M9903 Segmental and somatic dysfunction of lumbar region: Secondary | ICD-10-CM | POA: Diagnosis not present

## 2021-09-25 ENCOUNTER — Other Ambulatory Visit: Payer: Self-pay

## 2021-09-25 ENCOUNTER — Ambulatory Visit: Payer: PPO | Admitting: Podiatry

## 2021-09-25 ENCOUNTER — Encounter: Payer: Self-pay | Admitting: Podiatry

## 2021-09-25 DIAGNOSIS — M7672 Peroneal tendinitis, left leg: Secondary | ICD-10-CM | POA: Diagnosis not present

## 2021-09-25 DIAGNOSIS — M24472 Recurrent dislocation, left ankle: Secondary | ICD-10-CM | POA: Diagnosis not present

## 2021-09-25 NOTE — Patient Instructions (Signed)
Look for Voltaren gel at the pharmacy over the counter or online (also known as diclofenac 1% gel). Apply to the painful areas 3-4x daily with the supplied dosing card. Allow to dry for 10 minutes before going into socks/shoes   Peroneal Tendinopathy Rehab Ask your health care provider which exercises are safe for you. Do exercises exactly as told by your health care provider and adjust them as directed. It is normal to feel mild stretching, pulling, tightness, or discomfort as you do these exercises. Stop right away if you feel sudden pain or your pain gets worse. Do not begin these exercises until told by your health care provider. Stretching and range-of-motion exercises These exercises warm up your muscles and joints and improve the movement and flexibility of your ankle. These exercises also help to relieve pain and stiffness. Gastroc and soleus stretch, standing  This is an exercise in which you stand on a step and use your body weight to stretch your calf muscles. To do this exercise: Stand on the edge of a step on the ball of your left / right foot. The ball of your foot is on the walking surface, right under your toes. Keep your other foot firmly on the same step. Hold on to the wall, a railing, or a chair for balance. Slowly lift your other foot, allowing your body weight to press your left / right heel down over the edge of the step. You should feel a stretch in your left / right calf (gastrocnemius and soleus). Hold this position for 15 seconds. Return both feet to the step. Repeat this exercise with a slight bend in your left / right knee. Repeat 5 times with your left / right knee straight and 5 times with your left / right knee bent. Complete this exercise 2 times a day. Strengthening exercises These exercises build strength and endurance in your foot and ankle. Endurance is the ability to use your muscles for a long time, even after they get tired. Ankle dorsiflexion with  band   Secure a rubber exercise band or tube to an object, such as a table leg, that will not move when the band is pulled. Secure the other end of the band around your left / right foot. Sit on the floor, facing the object with your left / right leg extended. The band or tube should be slightly tense when your foot is relaxed. Slowly flex your left / right ankle and toes to bring your foot toward you (dorsiflexion). Hold this position for 15 seconds. Let the band or tube slowly pull your foot back to the starting position. Repeat 5 times. Complete this exercise 2 times a day. Ankle eversion Sit on the floor with your legs straight out in front of you. Loop a rubber exercise band or tube around the ball of your left / right foot. The ball of your foot is on the walking surface, right under your toes. Hold the ends of the band in your hands, or secure the band to a stable object. The band or tube should be slightly tense when your foot is relaxed. Slowly push your foot outward, away from your other leg (eversion). Hold this position for 15 seconds. Slowly return your foot to the starting position. Repeat 5 times. Complete this exercise 2 times a day. Plantar flexion, standing  This exercise is sometimes called standing heel raise. Stand with your feet shoulder-width apart. Place your hands on a wall or table to steady yourself as   needed, but try not to use it for support. Keep your weight spread evenly over the width of your feet while you slowly rise up on your toes (plantar flexion). If told by your health care provider: Shift your weight toward your left / right leg until you feel challenged. Stand on your left / right leg only. Hold this position for 15 seconds. Repeat 2 times. Complete this exercise 2 times a day. Single leg stand Without shoes, stand near a railing or in a doorway. You may hold on to the railing or door frame as needed. Stand on your left / right foot. Keep your  big toe down on the floor and try to keep your arch lifted. Do not roll to the outside of your foot. If this exercise is too easy, you can try it with your eyes closed or while standing on a pillow. Hold this position for 15 seconds. Repeat 5 times. Complete this exercise 2 times a day. This information is not intended to replace advice given to you by your health care provider. Make sure you discuss any questions you have with your health care provider. Document Revised: 02/03/2019 Document Reviewed: 02/03/2019 Elsevier Patient Education  2020 [employer]  

## 2021-09-25 NOTE — Progress Notes (Signed)
  Subjective:  Patient ID: Ricardo Winters., male    DOB: 1946/06/14,  MRN: [id number]  Chief Complaint  Patient presents with   Foot Pain    "It's better but it's still not right."    75 y.o. male presents with the above complaint. History confirmed with patient.  Previous right fourth and fifth toe heloma molle is doing much better he was able to treat it with salicylic acid successfully.  He returns with a new issue of pain on the outside of the lateral ankle.  Still is playing golf and walking mostly when playing.  No trauma or injury that he knows of recently or previously  Objective:  Physical Exam: warm, good capillary refill, no trophic changes or ulcerative lesions, normal DP and PT pulses, and normal sensory exam. Left Foot: Pain on palpation along the peroneal tendons distal to the malleolus and in the retromalleolar groove, slight clicking with circumduction, pain with resisted eversion Assessment:   1. Peroneal tendinitis, left   2. 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  Discussed the etiology and treatment options for peroneal tendinitis including stretching, formal physical therapy with an eccentric exercises therapy plan, supportive shoegears such as a running shoe or sneaker, support with bracing, topical and oral medications.  We also discussed that I do not routinely perform injections in this area because of the risk of an increased damage or rupture of the tendon.  We also discussed the role of surgical treatment of this for patients who do not improve after exhausting non-surgical treatment options.  -XR reviewed with patient -Educated on stretching and icing of the affected limb. -Recommended Voltaren gel topical use -Support Tri-Lock ankle brace -Discussed he may want to avoid walking while playing golf but can play while he is in the brace and use a  cart -He will let me know how he is doing the next 4 to 6 weeks if not improving then plan for PT and oral medications   Return if symptoms worsen or fail to improve.

## 2021-09-27 ENCOUNTER — Other Ambulatory Visit: Payer: Self-pay

## 2021-09-27 ENCOUNTER — Ambulatory Visit: Payer: PPO | Admitting: Dermatology

## 2021-09-27 DIAGNOSIS — Z1283 Encounter for screening for malignant neoplasm of skin: Secondary | ICD-10-CM

## 2021-09-27 DIAGNOSIS — L820 Inflamed seborrheic keratosis: Secondary | ICD-10-CM | POA: Diagnosis not present

## 2021-09-27 DIAGNOSIS — L814 Other melanin hyperpigmentation: Secondary | ICD-10-CM | POA: Diagnosis not present

## 2021-09-27 DIAGNOSIS — L719 Rosacea, unspecified: Secondary | ICD-10-CM

## 2021-09-27 DIAGNOSIS — L578 Other skin changes due to chronic exposure to nonionizing radiation: Secondary | ICD-10-CM

## 2021-09-27 DIAGNOSIS — I781 Nevus, non-neoplastic: Secondary | ICD-10-CM

## 2021-09-27 DIAGNOSIS — D692 Other nonthrombocytopenic purpura: Secondary | ICD-10-CM | POA: Diagnosis not present

## 2021-09-27 DIAGNOSIS — D229 Melanocytic nevi, unspecified: Secondary | ICD-10-CM

## 2021-09-27 DIAGNOSIS — D1800 Hemangioma unspecified site: Secondary | ICD-10-CM

## 2021-09-27 DIAGNOSIS — Z85828 Personal history of other malignant neoplasm of skin: Secondary | ICD-10-CM

## 2021-09-27 DIAGNOSIS — L821 Other seborrheic keratosis: Secondary | ICD-10-CM

## 2021-09-27 NOTE — Progress Notes (Signed)
Follow-Up Visit   Subjective  Ricardo Winters. is a 75 y.o. male who presents for the following: Nevus (L underside chin, has gotten swollen in past few weeks, crusty), Total body skin exam (Hx of skin ca nose txted in past by Dr. Nadeen Landau), and check spots (Back, right leg, crusty patches, no symptoms). The patient presents for Total-Body Skin Exam (TBSE) for skin cancer screening and mole check.  The patient has spots, moles and lesions to be evaluated, some may be new or changing and the patient has concerns that these could be cancer.  The following portions of the chart were reviewed this encounter and updated as appropriate:   Tobacco  Allergies  Meds  Problems  Med Hx  Surg Hx  Fam Hx     Review of Systems:  No other skin or systemic complaints except as noted in HPI or Assessment and Plan.  Objective  Well appearing patient in no apparent distress; mood and affect are within normal limits.  A full examination was performed including scalp, head, eyes, ears, nose, lips, neck, chest, axillae, abdomen, back, buttocks, bilateral upper extremities, bilateral lower extremities, hands, feet, fingers, toes, fingernails, and toenails. All findings within normal limits unless otherwise noted below.  L mandible x 3, Total = 3 (3) Erythematous keratotic or waxy stuck-on papule or plaque.   Head - Anterior (Face) Erythema and telangiectasias face  face Telangiectasias face   Assessment & Plan   Lentigines - Scattered tan macules - Due to sun exposure - Benign-appearing, observe - Recommend daily broad spectrum sunscreen SPF 30+ to sun-exposed areas, reapply every 2 hours as needed. - Call for any changes  Seborrheic Keratoses - Stuck-on, waxy, tan-brown papules and/or plaques  - Benign-appearing - Discussed benign etiology and prognosis. - Observe - Call for any changes  Melanocytic Nevi - Tan-brown and/or pink-flesh-colored symmetric macules and papules -  Benign appearing on exam today - Observation - Call clinic for new or changing moles - Recommend daily use of broad spectrum spf 30+ sunscreen to sun-exposed areas.   Hemangiomas - Red papules - Discussed benign nature - Observe - Call for any changes  Actinic Damage - Chronic condition, secondary to cumulative UV/sun exposure - diffuse scaly erythematous macules with underlying dyspigmentation - Recommend daily broad spectrum sunscreen SPF 30+ to sun-exposed areas, reapply every 2 hours as needed.  - Staying in the shade or wearing long sleeves, sun glasses (UVA+UVB protection) and wide brim hats (4-inch brim around the entire circumference of the hat) are also recommended for sun protection.  - Call for new or changing lesions.  Skin cancer screening performed today.   Inflamed seborrheic keratosis L mandible x 3, Total = 3 Destruction of lesion - L mandible x 3, Total = 3 Complexity: simple   Destruction method: cryotherapy   Informed consent: discussed and consent obtained   Timeout:  patient name, date of birth, surgical site, and procedure verified Lesion destroyed using liquid nitrogen: Yes   Region frozen until ice ball extended beyond lesion: Yes   Outcome: patient tolerated procedure well with no complications   Post-procedure details: wound care instructions given    Rosacea Head - Anterior (Face) Rosacea is a chronic progressive skin condition usually affecting the face of adults, causing redness and/or acne bumps. It is treatable but not curable. It sometimes affects the eyes (ocular rosacea) as well. It may respond to topical and/or systemic medication and can flare with stress, sun exposure, alcohol, exercise  and some foods.  Daily application of broad spectrum spf 30+ sunscreen to face is recommended to reduce flares.  Discussed BBL, $350 per txt session, several treatments for best results, info given.  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  Typically we recommend 1-3 treatment sessions about 5-8 weeks apart for best results.  The patient's condition may require "maintenance treatments" in the future.  The fee for BBL / laser treatments is $350 per treatment session for the whole face.  A fee can be quoted for other parts of the body. Insurance typically does not pay for BBL/laser treatments and therefore the fee is an out-of-pocket cost.  Skin cancer screening  Purpura - Chronic; persistent and recurrent.  Treatable, but not curable. - Violaceous macules and patches - Benign - Related to trauma, age, sun damage and/or use of blood thinners, chronic use of topical and/or oral steroids - Observe - Can use OTC arnica containing moisturizer such as Dermend Bruise Formula if desired - Call for worsening or other concerns  History of Skin Cancer  Clear. Observe for recurrence.  Call clinic for new or changing lesions.   Recommend regular skin exams, daily broad-spectrum spf 30+ sunscreen use, and photoprotection.     - nose, treated by Dr. Carlis Abbott years ago  Return in about 1 year (around 09/27/2022) for TBSE, hx of skin ca.  I, Othelia Pulling, RMA, am acting as scribe for Sarina Ser, MD . Documentation: I have reviewed the above documentation for accuracy and completeness, and I agree with the above.  Sarina Ser, MD

## 2021-09-27 NOTE — Patient Instructions (Signed)

## 2021-10-03 ENCOUNTER — Encounter: Payer: Self-pay | Admitting: Dermatology

## 2021-11-02 DIAGNOSIS — M25561 Pain in right knee: Secondary | ICD-10-CM | POA: Diagnosis not present

## 2021-11-02 DIAGNOSIS — M2391 Unspecified internal derangement of right knee: Secondary | ICD-10-CM | POA: Insufficient documentation

## 2021-11-02 DIAGNOSIS — M5430 Sciatica, unspecified side: Secondary | ICD-10-CM | POA: Insufficient documentation

## 2021-11-03 DIAGNOSIS — M9905 Segmental and somatic dysfunction of pelvic region: Secondary | ICD-10-CM | POA: Diagnosis not present

## 2021-11-03 DIAGNOSIS — M5136 Other intervertebral disc degeneration, lumbar region: Secondary | ICD-10-CM | POA: Diagnosis not present

## 2021-11-03 DIAGNOSIS — M5417 Radiculopathy, lumbosacral region: Secondary | ICD-10-CM | POA: Diagnosis not present

## 2021-11-03 DIAGNOSIS — M9903 Segmental and somatic dysfunction of lumbar region: Secondary | ICD-10-CM | POA: Diagnosis not present

## 2021-11-06 DIAGNOSIS — M5416 Radiculopathy, lumbar region: Secondary | ICD-10-CM | POA: Diagnosis not present

## 2021-11-20 ENCOUNTER — Telehealth: Payer: Self-pay | Admitting: Podiatry

## 2021-11-20 NOTE — Telephone Encounter (Signed)
He set up a appt with [facility] can you send a referral so his ins. Can pay for it

## 2021-11-20 NOTE — Addendum Note (Signed)
Addended bySherryle Lis, Marlissa Emerick R on: 11/20/2021 10:23 AM   Modules accepted: Orders

## 2021-11-21 DIAGNOSIS — D369 Benign neoplasm, unspecified site: Secondary | ICD-10-CM | POA: Diagnosis not present

## 2021-11-21 DIAGNOSIS — Z86010 Personal history of colonic polyps: Secondary | ICD-10-CM | POA: Diagnosis not present

## 2021-11-21 NOTE — Telephone Encounter (Signed)
"  I am calling to find out why Dr. Sherryle Lis denied me going to [facility].  They had sent over a request for the therapy and was told he denied it.  Do you know who they may have spoken to?  "I do not but I called last week requesting it and no one called me."  I see where you called yesterday and Dr. Sherryle Lis had said the he put it in your encounter and asked that the prescription be faxed.  "So you can send that today?"  Yes, we can send it today.  "I want to go the one on [address].  I think Kennyth Lose is the receptionist.  I want to see Gerald Stabs, he's a friend of mine."

## 2021-11-21 NOTE — Telephone Encounter (Signed)
I faxed the prescription to [facility].

## 2021-11-29 DIAGNOSIS — M25572 Pain in left ankle and joints of left foot: Secondary | ICD-10-CM | POA: Diagnosis not present

## 2021-12-19 ENCOUNTER — Ambulatory Visit: Payer: PPO | Admitting: Student in an Organized Health Care Education/Training Program

## 2021-12-26 ENCOUNTER — Ambulatory Visit: Payer: PPO | Admitting: Student in an Organized Health Care Education/Training Program

## 2022-01-17 ENCOUNTER — Other Ambulatory Visit: Payer: Self-pay | Admitting: Cardiovascular Disease

## 2022-01-17 NOTE — Telephone Encounter (Signed)
Please contact pt for future appointment. Pt due for 12 month f/u. 

## 2022-01-17 NOTE — Progress Notes (Signed)
Cardiology Office Note ? ?Date:  01/18/2022  ? ?ID:  [name]., DOB [birth year], MRN [id number] ? ?PCP:  Rusty Aus, MD  ? ?Chief Complaint  ?Patient presents with  ? Other  ?  12 month follow up -- Meds reviewed verbally with patient.   ? ? ?HPI:  ?[name] is a 76 year old gentleman with history of ?CAD, status post PCI of the RCA in 07/2014 after suffering a ST elevation myocardial infarction. ?bradycardia,  ?Hypertension, ?hyperlipidemia,  ?h/o DVT, 2012 ?h/o COVID-19 infection, ? osteoarthritis,  ?elevated prostate antigen, ?PVD  ?lumbar radiculopathy  s/p spinal injections.  ?Who presents for follow-up of his coronary disease stable angina ? ?Last seen by myself in clinic March 2022 ?Reports he is doing well, no significant changes ? ?Active, plays golf ?Denies chest pain concerning for angina ? ?Followed by Dr. Jimmye Norman who practices locally, not in the [facility] system ?Old lab work reviewed  ?total 164, LDL 94 ?Now reports that he is off statins secondary to myalgias ?Previous issues with Lipitor, pravastatin ? ?BP at home 130s ?Tolerating losartan 100 daily ? ?EKG personally reviewed by myself on todays visit ?Normal sinus rhythm rate 60 bpm no significant ST-T wave changes ? ?Other past medical history reviewed ? ?Chronic left leg swelling, >right ?Old DVT left leg ? ?June 2021 through [facility] underwent cardiac testing ?echocardiogram revealed mild LV systolic dysfunction with mild LVH and an estimated EF of 45%.  ? ?Myoview was unremarkable and revealed no evidence of stress-induced myocardial ischemia.  ? ?ER 01/2020 ?Nonexertional chest pain ?enz neg ? ?Er 03/10/2018 ?Syncope ?on lisinopril/hydrochlorothiazide at 20/12.5 ?May have been dehydrated ? ? ?PMH:   has a past medical history of Anginal pain ([name]), Cancer ([name]), Coronary artery disease, COVID-19, Edema, Hypertension, Myocardial infarction ([name]), Obesity, and Varicose veins of both lower extremities. ? ?PSH:    ?Past Surgical  History:  ?Procedure Laterality Date  ? CARDIAC CATHETERIZATION  2015  ? stent  ? CATARACT EXTRACTION W/PHACO Right 11/30/2020  ? Procedure: CATARACT EXTRACTION PHACO AND INTRAOCULAR LENS PLACEMENT (IOC)  RIGHT 7.70 01:03.4 12.1%;  Surgeon: [name], MD;  Location: [facility];  Service: Ophthalmology;  Laterality: Right;  requests early  ? COLONOSCOPY WITH PROPOFOL N/A 12/12/2015  ? Procedure: COLONOSCOPY WITH PROPOFOL;  Surgeon: [name], MD;  Location: [facility] ENDOSCOPY;  Service: Endoscopy;  Laterality: N/A;  ? KNEE SURGERY    ? SHOULDER ACROMIOPLASTY    ? TOTAL HIP ARTHROPLASTY Bilateral   ? Right 2017, left 2020  ? ? ?Current Outpatient Medications  ?Medication Sig Dispense Refill  ? Ascorbic Acid (VITAMIN C) 500 MG CAPS Take by mouth daily.    ? aspirin 81 MG tablet Take 1 tablet (81 mg total) by mouth daily.    ? Black Pepper-Turmeric 3-500 MG CAPS Take by mouth daily.    ? Cholecalciferol (VITAMIN D3) 1.25 MG (50000 UT) TABS Take 100 mcg by mouth daily.    ? COLLAGEN PO Take 1 Scoop by mouth daily.    ? Ferrous Gluconate (IRON 27 PO) Take by mouth daily.    ? GRAPE SEED CR PO Take by mouth daily.    ? losartan (COZAAR) 100 MG tablet Take 1 tablet (100 mg total) by mouth daily. 90 tablet 3  ? nitroGLYCERIN (NITROSTAT) 0.4 MG SL tablet Place 0.4 mg under the tongue every 5 (five) minutes as needed for chest pain. Reported on 12/12/2015    ? Omega-3 1000 MG CAPS Take  1,000 mg by mouth daily.    ? QUERCETIN PO Take 500 mg by mouth daily.    ? sildenafil (REVATIO) 20 MG tablet Take 20 mg by mouth daily as needed (ED).     ? vitamin B-12 (CYANOCOBALAMIN) 500 MCG tablet Take 1,000 mcg by mouth daily.    ? zinc sulfate 220 (50 Zn) MG capsule Take 50 mg by mouth daily.    ? ?No current facility-administered medications for this visit.  ? ? ? ?Allergies:   Patient has no known allergies.  ? ?Social History:  The patient  reports that he has never smoked. He has never used smokeless tobacco. He  reports current alcohol use of about 4.0 standard drinks per week. He reports that he does not use drugs.  ? ?Family History:   family history includes Heart disease in his father; Obesity in his brother and brother.  ? ? ?Review of Systems: ?Review of Systems  ?Constitutional: Negative.   ?HENT: Negative.    ?Respiratory: Negative.    ?Cardiovascular:  Positive for leg swelling.  ?Gastrointestinal: Negative.   ?Musculoskeletal: Negative.   ?Neurological: Negative.   ?Psychiatric/Behavioral: Negative.    ?All other systems reviewed and are negative. ? ? ?PHYSICAL EXAM: ?VS:  BP 140/80 (BP Location: Left Arm, Patient Position: Sitting, Cuff Size: Normal)   Pulse 60   Ht '6\' 1"'$  (1.854 m)   Wt 234 lb (106.1 kg)   SpO2 98%   BMI 30.87 kg/m?  , BMI Body mass index is 30.87 kg/m?Marland Kitchen ?Constitutional:  oriented to person, place, and time. No distress.  ?HENT:  ?Head: Grossly normal ?Eyes:  no discharge. No scleral icterus.  ?Neck: No JVD, no carotid bruits  ?Cardiovascular: Regular rate and rhythm, no murmurs appreciated ?Pulmonary/Chest: Clear to auscultation bilaterally, no wheezes or rails ?Abdominal: Soft.  no distension.  no tenderness.  ?Musculoskeletal: Normal range of motion ?Neurological:  normal muscle tone. Coordination normal. No atrophy ?Skin: Skin warm and dry ?Psychiatric: normal affect, pleasant ? ? ?Recent Labs: ?No results found for requested labs within last 8760 hours.  ? ? ?Lipid Panel ?Lab Results  ?Component Value Date  ? CHOL 237 (H) 08/02/2014  ? HDL 56 08/02/2014  ? LDLCALC 142 (H) 08/02/2014  ? TRIG 197 08/02/2014  ? ?  ? ?Wt Readings from Last 3 Encounters:  ?01/18/22 234 lb (106.1 kg)  ?01/09/21 234 lb (106.1 kg)  ?11/30/20 231 lb (104.8 kg)  ?  ? ? ?ASSESSMENT AND PLAN: ? ?Problem List Items Addressed This Visit   ? ?  ? Cardiology Problems  ? Hyperlipidemia, mixed  ? Myocardial infarction, inferior wall ([place])  ? CAD (coronary artery disease) - Primary  ? HTN (hypertension)  ? ?Other Visit  Diagnoses   ? ? Myalgia due to statin      ? ?  ?Cad with stable angina ?stent to the RCA 2015 ?Prior stress test reviewed, no significant ischemia ?Ejection fraction 45% on outside study ?Currently with no symptoms of angina. No further workup at this time. Continue current medication regimen. ? ?Cardiomyopathy ?Remote outside echocardiogram ejection fraction 45% ?Echocardiogram May 2019 normal ejection fraction ? losartan up to 100 daily ?No updated study, appears euvolemic, no anginal symptoms ?  ?NSTEMI s/p PCI/DES,  ?Prior DES 2015, ?Aspirin to 81 mg daily ?Denies anginal symptoms ?  ?Bradycardia,  ?Asymptomatic ?Avoid b-blocker ?  ?H/o DVT, stable ? recommend compression hose ?May need NOAC before trips ?  ?Hypertension, ?Blood pressure is well controlled on today's  visit. No changes made to the medications. ?  ?Hyperlipidemia ?Recommend he restart Zetia 10 mg daily ?Could also take bempedoic acid for a 20-40 percentage point drop ?  ?PVD, chronic, stable ? followed by vascular ?Denies claudication symptoms ?No changes ? ?Statin myalgias ?Has tried several statins, does not want to restart  ?Restart Zetia ?Consider bempedoic acid ? ? Total encounter time more than 30 minutes ? Greater than 50% was spent in counseling and coordination of care with the patient ? ? ?Signed, ?Esmond Plants, M.D., Ph.D. ?[facility] [place], [place] ?[phone] ?

## 2022-01-18 ENCOUNTER — Encounter: Payer: Self-pay | Admitting: Cardiovascular Disease

## 2022-01-18 ENCOUNTER — Ambulatory Visit: Payer: PPO | Admitting: Cardiovascular Disease

## 2022-01-18 ENCOUNTER — Other Ambulatory Visit: Payer: Self-pay

## 2022-01-18 VITALS — BP 140/80 | HR 60 | Ht 73.0 in | Wt 234.0 lb

## 2022-01-18 DIAGNOSIS — I10 Essential (primary) hypertension: Secondary | ICD-10-CM

## 2022-01-18 DIAGNOSIS — E782 Mixed hyperlipidemia: Secondary | ICD-10-CM

## 2022-01-18 DIAGNOSIS — T466X5D Adverse effect of antihyperlipidemic and antiarteriosclerotic drugs, subsequent encounter: Secondary | ICD-10-CM | POA: Diagnosis not present

## 2022-01-18 DIAGNOSIS — M791 Myalgia, unspecified site: Secondary | ICD-10-CM | POA: Diagnosis not present

## 2022-01-18 DIAGNOSIS — I25118 Atherosclerotic heart disease of native coronary artery with other forms of angina pectoris: Secondary | ICD-10-CM | POA: Diagnosis not present

## 2022-01-18 DIAGNOSIS — I2119 ST elevation (STEMI) myocardial infarction involving other coronary artery of inferior wall: Secondary | ICD-10-CM

## 2022-01-18 MED ORDER — LOSARTAN POTASSIUM 100 MG PO TABS: 100.0000 mg | ORAL_TABLET | Freq: Every day | ORAL | 3 refills | Status: DC

## 2022-01-18 MED ORDER — EZETIMIBE 10 MG PO TABS: 10.0000 mg | ORAL_TABLET | Freq: Every day | ORAL | 3 refills | Status: DC

## 2022-01-18 NOTE — Patient Instructions (Addendum)
Read about bempedoic acid ? ?Goal LDL <70 ?Preferably <60 ? ? ?Medication Instructions:  ?- Your physician has recommended you make the following change in your medication:  ? ?1) START zetia (ezetimibe) 10 mg: ?- take 1 tablet by mouth once daily ? ?If you need a refill on your cardiac medications before your next appointment, please call your pharmacy.  ? ?Lab work: ?No new labs needed ? ?Testing/Procedures: ?No new testing needed ? ?Follow-Up: ?At [facility], you and your health needs are our priority.  As part of our continuing mission to provide you with exceptional heart care, we have created designated Provider Care Teams.  These Care Teams include your primary Cardiologist (physician) and Advanced Practice Providers (APPs -  Physician Assistants and Nurse Practitioners) who all work together to provide you with the care you need, when you need it. ? ?You will need a follow up appointment in 12 months ? ?Providers on your designated Care Team:   ?Murray Hodgkins, NP ?Christell Faith, PA-C ?Cadence Kathlen Mody, PA-C ? ?COVID-19 Vaccine Information can be found at: [email or web address] For questions related to vaccine distribution or appointments, please email [email or web address] or call [phone].  ? ? ?ZETIA (Ezetimibe) Tablets ?What is this medication? ?EZETIMIBE (ez ET i mibe) treats high cholesterol. It works by reducing the amount of cholesterol absorbed from the food you eat. This decreases the amount of bad cholesterol (such as LDL) in your blood. Changes to diet and exercise are often combined with this medication. ?This medicine may be used for other purposes; ask your health care provider or pharmacist if you have questions. ?COMMON BRAND NAME(S): Zetia ?What should I tell my care team before I take this medication? ?They need to know if you have any of these conditions: ?Kidney disease ?Liver disease ?Muscle cramps, pain ?Muscle  injury ?Thyroid disease ?An unusual or allergic reaction to ezetimibe, other medications, foods, dyes, or preservatives ?Pregnant or trying to get pregnant ?Breast-feeding ?How should I use this medication? ?Take this medication by mouth. Take it as directed on the prescription label at the same time every day. You can take it with or without food. If it upsets your stomach, take it with food. Keep taking it unless your care team tells you to stop. ?Take bile acid sequestrants at a different time of day than this medication. Take this medication 2 hours BEFORE or 4 hours AFTER bile acid sequestrants. ?Talk to your care team about the use of this medication in children. While it may be prescribed for children as young as 10 for selected conditions, precautions do apply. ?Overdosage: If you think you have taken too much of this medicine contact a poison control center or emergency room at once. ?NOTE: This medicine is only for you. Do not share this medicine with others. ?What if I miss a dose? ?If you miss a dose, take it as soon as you can. If it is almost time for your next dose, take only that dose. Do not take double or extra doses. ?What may interact with this medication? ?Do not take this medication with any of the following: ?Fenofibrate ?Gemfibrozil ?This medication may also interact with the following: ?Antacids ?Cyclosporine ?Herbal medications like red yeast rice ?Other medications to lower cholesterol or triglycerides ?This list may not describe all possible interactions. Give your health care provider a list of all the medicines, herbs, non-prescription drugs, or dietary supplements you use. Also tell them if you smoke, drink alcohol, or use illegal drugs. Some items may interact  with your medicine. ?What should I watch for while using this medication? ?Visit your care team for regular checks on your progress. Tell your care team if your symptoms do not start to get better or if they get worse. ?Your  care team may tell you to stop taking this medication if you develop muscle problems. If your muscle problems do not go away after stopping this medication, contact your care team. ?Do not become pregnant while taking this medication. Women should inform their care team if they wish to become pregnant or think they might be pregnant. There is potential for serious harm to an unborn child. Talk to your care team for more information. Do not breast-feed an infant while taking this medication. ?Taking this medication is only part of a total heart healthy program. Your care team may give you a special diet to follow. Avoid alcohol. Avoid smoking. Ask your care team how much you should exercise. ?What side effects may I notice from receiving this medication? ?Side effects that you should report to your doctor or health care provider as soon as possible: ?Allergic reactions--skin rash, itching or hives, swelling of the face, lips, tongue, or throat ?Side effects that usually do not require medical attention (report to your doctor or health care provider if they continue or are bothersome): ?Diarrhea ?Joint pain ?This list may not describe all possible side effects. Call your doctor for medical advice about side effects. You may report side effects to FDA at 1-800-FDA-1088. ?Where should I keep my medication? ?Keep out of the reach of children and pets. ?Store at room temperature between 15 and 30 degrees C (59 and 86 degrees F). Protect from moisture. Get rid of any unused medication after the expiration date. ?NOTE: This sheet is a summary. It may not cover all possible information. If you have questions about this medicine, talk to your doctor, pharmacist, or health care provider. ?? 2022 Elsevier/Gold Standard (2020-11-11 00:00:00) ? ? ?Bempedoic acid tablets ?What is this medication? ?Bempedoic acid (BEM pe DOE ik AS id) is used to lower the level of cholesterol in the blood. It is used with other cholesterol-lowering  drugs. ?This medicine may be used for other purposes; ask your health care provider or pharmacist if you have questions. ?COMMON BRAND NAME(S): NEXLETOL ?What should I tell my care team before I take this medication? ?They need to know if you have any of these conditions: ?gout ?kidney problems ?liver problems ?tendon problems ?an unusual or allergic reaction to bempedoic acid, other medicines, foods, dyes, or preservatives ?pregnant or trying to become pregnant ?breast-feeding ?How should I use this medication? ?Take this medicine by mouth with a glass of water. Follow the directions on the prescription label. You can take it with or without food. If it upsets your stomach, take it with food. Take your doses at regular intervals. Do not take your medicine more often than directed. ?Talk to your pediatrician about the use of this medicine in children. Special care may be needed. ?Overdosage: If you think you have taken too much of this medicine contact a poison control center or emergency room at once. ?NOTE: This medicine is only for you. Do not share this medicine with others. ?What if I miss a dose? ?If you miss a dose, take it as soon as you can. If it is almost time for your next dose, take only that dose. Do not take double or extra doses. ?What may interact with this medication? ?This medicine may interact  with the following medications: ?simvastatin ?pravastatin ?This list may not describe all possible interactions. Give your health care provider a list of all the medicines, herbs, non-prescription drugs, or dietary supplements you use. Also tell them if you smoke, drink alcohol, or use illegal drugs. Some items may interact with your medicine. ?What should I watch for while using this medication? ?Visit your health care professional for regular checks on your progress. Tell your health care professional if your symptoms do not start to get better or if they get worse. You may need blood work done while you  are taking this medicine. ?This drug is only part of a total heart-health program. Your doctor or a dietician can suggest a low-cholesterol and low-fat diet to help. Avoid alcohol and smoking, and keep a proper exerc

## 2022-02-01 ENCOUNTER — Ambulatory Visit: Payer: PPO | Admitting: Dermatology

## 2022-02-10 DIAGNOSIS — M25572 Pain in left ankle and joints of left foot: Secondary | ICD-10-CM | POA: Diagnosis not present

## 2022-02-12 ENCOUNTER — Telehealth: Payer: Self-pay | Admitting: Podiatry

## 2022-02-12 NOTE — Telephone Encounter (Signed)
Patient called wanting to schedule an appointment. Patient is having pain in his ankle. Scheduled pt next available with you. No other openings in [facility] this week for est pt. Patient saw [facility] over the weekend. Pt was wondering if there is any medication or anything he can do until his appt. ?

## 2022-02-21 ENCOUNTER — Ambulatory Visit: Payer: PPO | Admitting: Podiatry

## 2022-02-21 ENCOUNTER — Ambulatory Visit: Payer: PPO

## 2022-02-21 DIAGNOSIS — M7672 Peroneal tendinitis, left leg: Secondary | ICD-10-CM | POA: Diagnosis not present

## 2022-02-21 NOTE — Patient Instructions (Signed)
Call to schedule your MRI ? ?[address], [place] [address] ? ? ?[phone] ?

## 2022-02-23 ENCOUNTER — Telehealth: Payer: Self-pay | Admitting: Podiatry

## 2022-02-23 NOTE — Telephone Encounter (Signed)
Pt called to schedule the mri and they said they did not have orders for the mri only for an injection. Could you please send the orders over for pt so he can get the mri scheduled. ?

## 2022-02-24 NOTE — Progress Notes (Signed)
?  Subjective:  ?Patient ID: Ricardo Winters., male    DOB: 1946/03/10,  MRN: [id number] ? ?Chief Complaint  ?Patient presents with  ? Tendonitis  ?  patient having issues with left ankle  ? ? ?76 y.o. male presents with the above complaint. History confirmed with patient.   ? ?Continues to have significant pain.  He completed several weeks of physical therapy and this did not completely help.  He wore the brace especially when he is out on uneven ground.  He recently completed a prednisone Dosepak a few days ago that helped quite a lot but now the pain is starting ? ?Objective:  ?Physical Exam: ?warm, good capillary refill, no trophic changes or ulcerative lesions, normal DP and PT pulses, and normal sensory exam. ?Left Foot: Pain on palpation along the peroneal tendons distal to the malleolus and in the retromalleolar groove, slight clicking with circumduction, pain with resisted eversion ? ? 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 ?1. Peroneal tendinitis, left   ? ? ? ?Plan:  ?Patient was evaluated and treated and all questions answered. ? ?He has now been dealing with his left ankle pain on his peroneal tendons for several months.  He is to use a Tri-Lock ankle brace Voltaren and his recent completed a prednisone Dosepak which helped temporarily but pain is returned.  Physical therapy did not help alleviate this either.  At this point I recommended we order an MRI to evaluate the tendon for any possible tearing as may need surgical intervention, also discussed corticosteroid injection with CAM boot immobilization as well as PRP injection. ? ? ?Return for after MRI to review.  ? ? ?

## 2022-03-02 ENCOUNTER — Ambulatory Visit
Admission: RE | Admit: 2022-03-02 | Discharge: 2022-03-02 | Disposition: A | Discharge status: Home / Self Care | Payer: PPO | Source: Ambulatory Visit | Attending: Podiatry | Admitting: Podiatry

## 2022-03-02 DIAGNOSIS — M7732 Calcaneal spur, left foot: Secondary | ICD-10-CM | POA: Diagnosis not present

## 2022-03-02 DIAGNOSIS — M7672 Peroneal tendinitis, left leg: Secondary | ICD-10-CM

## 2022-03-02 DIAGNOSIS — S86312A Strain of muscle(s) and tendon(s) of peroneal muscle group at lower leg level, left leg, initial encounter: Secondary | ICD-10-CM | POA: Diagnosis not present

## 2022-03-02 DIAGNOSIS — S93422A Sprain of deltoid ligament of left ankle, initial encounter: Secondary | ICD-10-CM | POA: Diagnosis not present

## 2022-03-02 DIAGNOSIS — M65872 Other synovitis and tenosynovitis, left ankle and foot: Secondary | ICD-10-CM | POA: Diagnosis not present

## 2022-03-06 ENCOUNTER — Telehealth: Payer: Self-pay | Admitting: Podiatry

## 2022-03-06 NOTE — Telephone Encounter (Signed)
Pt has been scheduled.  °

## 2022-03-06 NOTE — Telephone Encounter (Signed)
Pt had MRI done on 03/02/22 and wants to know if he can speak with Dr. Sherryle Lis over the phone for the results or does he need to make an appt to be seen.  ? ?Please advise ?

## 2022-03-12 ENCOUNTER — Ambulatory Visit: Payer: PPO | Admitting: Podiatry

## 2022-03-12 DIAGNOSIS — Q6672 Congenital pes cavus, left foot: Secondary | ICD-10-CM

## 2022-03-12 DIAGNOSIS — M7662 Achilles tendinitis, left leg: Secondary | ICD-10-CM | POA: Diagnosis not present

## 2022-03-12 DIAGNOSIS — S86312A Strain of muscle(s) and tendon(s) of peroneal muscle group at lower leg level, left leg, initial encounter: Secondary | ICD-10-CM | POA: Diagnosis not present

## 2022-03-13 ENCOUNTER — Encounter: Payer: Self-pay | Admitting: Podiatry

## 2022-03-13 NOTE — Progress Notes (Signed)
?Subjective:  ?Patient ID: Ricardo Silvius., male    DOB: Jan 04, 1946,  MRN: [id number] ? ?Chief Complaint  ?Patient presents with  ? Tendonitis  ?  MRI results  ? ? ?76 y.o. male presents with the above complaint. History confirmed with patient.   ? ?Continues to have significant pain.  He completed several weeks of physical therapy and this did not completely help.  He wore the brace especially when he is out on uneven ground.  He recently completed a prednisone Dosepak a few days ago that helped quite a lot but now the pain is starting ? ?Interval history: ?Returns for follow-up after completing the MRI.  He states he still feels the same as for the pain.  Has not been able to play golf. ?Objective:  ?Physical Exam: ?warm, good capillary refill, no trophic changes or ulcerative lesions, normal DP and PT pulses, and normal sensory exam. ?Left Foot: Pain on palpation along the peroneal tendons distal to the malleolus and in the retromalleolar groove, slight clicking with circumduction without gross dislocation, pain with resisted eversion ? ? 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  ?TECHNIQUE: ?Multiplanar, multisequence MR imaging of the ankle was performed. No ?intravenous contrast was administered. ?  ?COMPARISON:  Left foot radiographs 05/07/2012 ?  ?FINDINGS: ?TENDONS ?  ?Peroneal: Mild to moderate peroneus longus and brevis tenosynovitis. ?Mild linear intermediate T2 signal within the peroneus brevis tendon ?just distal to the fibula (axial series 4 images 19 and 20 and ?coronal series 7, image 15), a tiny short-segment partial-thickness ?tear measuring only approximately 5 mm in length. There is moderate ?intermediate T2 signal thickening and irregularity of the peroneus ?longus tendon (axial series 4 images 18 through 28, coronal images ?13 through 21), multiple longitudinal partial-thickness  tears along ?an approximate 4 cm length of the tendon. ?  ?Posteromedial: Intact tibialis posterior, flexor digitorum longus, ?and flexor hallucis longus tendons. ?  ?Anterior: The tibialis anterior, extensor hallucis longus, and ?extensor digitorum longus tendons are intact. ?  ?Achilles: There is mild longitudinal linear near fluid bright signal ?within the slightly medial aspect of the Achilles tendon ?midsubstance in a region measuring up to 5 mm in transverse ?dimension, 1.5 mm in AP dimension, and 24 mm in craniocaudal ?dimension (sagittal series 6, image 14 and axial series 4 images 13 ?through 17). This suggests a tiny midsubstance partial-thickness ?tear. No tendon retraction. ?  ?Plantar Fascia: Moderate plantar calcaneal heel spurs. No associated ?marrow edema. Mild chronic thickening of the medial and lateral ?bands of the plantar fascia. No acute plantar fasciitis. Moderate ?abductor hallucis and mild flexor digitorum brevis muscle edema, ?likely muscle strains. ?  ?LIGAMENTS ?  ?Lateral: The anterior and posterior talofibular, anterior and ?posterior tibiofibular, and calcaneofibular ligaments are intact. ?  ?Medial: There is mild irregularity and intermediate T2 signal ?scarring of the tibiotalar deep deltoid ligament. Punctate 2 mm ?anterior deep aspect of the deltoid ligament partial-thickness tear ?(coronal series 7, image 18). ?  ?CARTILAGE ?  ?Ankle Joint: Intact cartilage. ?  ?Subtalar Joints/Sinus Tarsi: Fat is preserved within sinus tarsi. ?  ?Bones: Mild dorsal talonavicular degenerative osteophytosis. Mild ?cartilage thinning and peripheral osteophytosis of the ?calcaneocuboid joint and the tarsometatarsal joints. ?  ?Other: The Lisfranc ligament complex is intact. The tarsal tunnel is ?unremarkable. ?  ?IMPRESSION:: ?IMPRESSION: ?1. Mild-to-moderate peroneus longus and brevis tenosynovitis. There ?is a moderate partial-thickness  longitudinal tear of the peroneus ?longus tendon along an  approximate 4 cm length. Small ?partial-thickness peroneus brevis tendon tear just distal to the ?fibula, measuring only 5 mm in length. ?2. Small, longitudinal, partial-thickness midsubstance tear within ?the slightly medial aspect of the distal Achilles tendon. No tendon ?retraction. ?3. Moderate plantar calcaneal heel spur. Moderate abductor hallucis ?and mild flexor digitorum brevis muscle edema, likely muscle ?strains. No plantar fasciitis. ?4. Chronic scarring and small partial-thickness tear within the ?tibiotalar deep deltoid ligament. ?  ?  ?Electronically Signed ?  By: Yvonne Kendall M.D. ?  On: 03/02/2022 15:22 ?  ?Assessment:  ? ?1. Peroneal tendon tear, left, initial encounter   ?2. Pes cavus of left foot   ?3. Achilles tendinitis of left lower extremity   ? ? ? ?Plan:  ?Patient was evaluated and treated and all questions answered. ? ?Reviewed the results of the MRI with him as well as his clinical progress.  We discussed further treatment I think he has reasonably exhausted all nonsurgical treatment options at this point.  I recommended repair of the peroneal tendon tear.  Discussed with him that the inflammation and tearing is relatively severe on his MRI and this may require allograft reconstruction versus tendon transfer or tenodesis.  I reviewed the images independently and there does still seem to be a maintained groove in the fibula for the tendons to track and, I do not think he needs a groove deepening osteotomy at this point.  Clinically he has only minimal pain around the Achilles which shows a partial tear.  I recommended plate rich plasma injection the same, surgery for this as well.  We discussed the risk benefits and potential complications from surgery including but not limited to  pain, swelling, infection, scar, numbness which may be temporary or permanent, chronic pain, stiffness, nerve pain or damage, wound healing problems, as well as the expected postoperative course and the period  of nonweightbearing for 4 to 6 weeks before beginning therapy and transition to weightbearing.  He understands and wishes to proceed.  Informed consent was signed and reviewed.  All questions were addressed. ? ? ?Surgical plan: ? ?Procedure: ?-Left lower extremity peroneal tendon repair and possible reconstruction with allograft, PRP injection Achilles ? ?Location: ?-[facility] ? ?Anesthesia plan: ?-IV sedation with a regional block ? ?Postoperative pain plan: ?- Tylenol 1000 mg every 6 hours,  gabapentin 300 mg every 8 hours x5 days, oxycodone 5 mg 1-2 tabs every 6 hours only as needed ? ?DVT prophylaxis: ?-Xarelto 10 mg nightly ? ?WB Restrictions / DME needs: ?-NWB in cast postop ? ? ? ?No follow-ups on file.  ? ? ?

## 2022-04-12 ENCOUNTER — Telehealth: Payer: Self-pay | Admitting: Cardiovascular Disease

## 2022-04-12 NOTE — Telephone Encounter (Signed)
Pt c/o medication issue:  1. Name of Medication: losartan (COZAAR) 100 MG tablet  2. How are you currently taking this medication (dosage and times per day)? Patient is taking 1 1/2 tablet (150 mg total) by mouth daily  3. Are you having a reaction (difficulty breathing--STAT)? No   4. What is your medication issue? Patient is taking an extra half tablet a day due to increased BP according to patient's PCP. Patient wants to know if a prescription can be sent in for losartan (COZAAR) 150 MG tablet

## 2022-04-12 NOTE — Telephone Encounter (Signed)
Patient saw pcp, Dr.Jonathan Williams, 2 weeks ago and had a BP 150/92. Patient took later at BP later at home and recorded 140/80's. He was told to take an extra 50 mg Losartan daily by pcp.   For the past week,  patient has taken Losartan 150 mg daily. He reports BP's running 130/80.    Do you concur with PCP plan to increase Losartan?

## 2022-04-13 NOTE — Telephone Encounter (Signed)
Ricardo Merritts, MD     Typically I try not to go over 100 mg on the losartan  That is not to say that he can go to 150 but we would need to watch his kidney function closely  Alternatively he could go back to 100 mg losartan and add a different medication such as isosorbide 30 mg daily  Thx  TG    Called and spoke with patient.   Advised patient of MDs recommendations.   Patient states that he would rather stay on one medication. States that he would prefer to monitor his BP on losartan 100 mg daily and will call if his BP continues to run high.    Will route to MD as FYI.

## 2022-04-18 ENCOUNTER — Telehealth: Payer: Self-pay

## 2022-04-18 DIAGNOSIS — M25572 Pain in left ankle and joints of left foot: Secondary | ICD-10-CM | POA: Diagnosis not present

## 2022-04-18 DIAGNOSIS — M7672 Peroneal tendinitis, left leg: Secondary | ICD-10-CM | POA: Diagnosis not present

## 2022-04-18 DIAGNOSIS — M7670 Peroneal tendinitis, unspecified leg: Secondary | ICD-10-CM | POA: Insufficient documentation

## 2022-04-18 NOTE — Telephone Encounter (Signed)
Deborah called to cancel his surgery with Dr. Sherryle Lis. He stated he went for a 2nd opinion and he would like to try conservation treatment with that provider over surgery. Notified Dr. Sherryle Lis and Caren Griffins with [place]

## 2022-05-11 DIAGNOSIS — M19079 Primary osteoarthritis, unspecified ankle and foot: Secondary | ICD-10-CM | POA: Insufficient documentation

## 2022-05-23 ENCOUNTER — Ambulatory Visit
Payer: PPO | Attending: Student in an Organized Health Care Education/Training Program | Admitting: Student in an Organized Health Care Education/Training Program

## 2022-05-23 ENCOUNTER — Encounter: Payer: PPO | Admitting: Podiatry

## 2022-05-23 ENCOUNTER — Encounter: Payer: Self-pay | Admitting: Student in an Organized Health Care Education/Training Program

## 2022-05-23 VITALS — BP 185/89 | HR 48 | Resp 18 | Ht 72.0 in | Wt 235.0 lb

## 2022-05-23 DIAGNOSIS — M19072 Primary osteoarthritis, left ankle and foot: Secondary | ICD-10-CM | POA: Insufficient documentation

## 2022-05-23 DIAGNOSIS — M25572 Pain in left ankle and joints of left foot: Secondary | ICD-10-CM | POA: Diagnosis not present

## 2022-05-23 NOTE — Progress Notes (Signed)
PROVIDER NOTE: Information contained herein reflects review and annotations entered in association with encounter. Interpretation of such information and data should be left to medically-trained personnel. Information provided to patient can be located elsewhere in the medical record under "Patient Instructions". Document created using STT-dictation technology, any transcriptional errors that may result from process are unintentional.    Patient: Ricardo Winters.  Service Category: E/M  Provider: Gillis Santa, MD  DOB: 03/28/1946  DOS: 05/23/2022  Referring Provider: Rusty Aus, MD  MRN: [id number]  Specialty: Interventional Pain Management  PCP: Rusty Aus, MD  Type: Established Patient  Setting: Ambulatory outpatient    Location: Office  Delivery: Face-to-face     HPI  [name]., a 76 y.o. 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 [name] primary complain today is Ankle Pain (left)  Pertinent problems: [name] has Primary osteoarthritis of left hip; Status post total replacement of right hip; Primary osteoarthritis of left knee; Chronic left SI joint pain; Sacroiliac joint dysfunction of left side; Chronic pain syndrome; Piriformis syndrome, right; Derangement of right SI joint; and Piriformis syndrome, left on their pertinent problem list. Pain Assessment: Severity of Chronic pain is reported as a 6 /10. Location: Ankle Left/denies. Onset: More than a month ago. Quality: Sharp. Timing: Intermittent. Modifying factor(s): meds, ice. Vitals:  height is 6' (1.829 m) and weight is 235 lb (106.6 kg). His blood pressure is 185/89 (abnormal) and his pulse is 48 (abnormal). His respiration is 18 and oxygen saturation is 98%.   Reason for encounter: Left ankle, left transverse metatarsal pain  Ricardo Winters presents today for left ankle, left transverse metatarsal pain that is worse with ankle inversion.  He is being  sent here for a left TMT small joint injection under ultrasound guidance.  ROS  Constitutional: Denies any fever or chills Gastrointestinal: No reported hemesis, hematochezia, vomiting, or acute GI distress Musculoskeletal:  Left ankle, TMT pain with ankle inversion Neurological: No reported episodes of acute onset apraxia, aphasia, dysarthria, agnosia, amnesia, paralysis, loss of coordination, or loss of consciousness  Medication Review  Black Pepper-Turmeric, Collagen, Ferrous Gluconate, Grape Seed, Omega-3, Quercetin, Vitamin C, Vitamin D3, aspirin EC, cyanocobalamin, ezetimibe, losartan, nitroGLYCERIN, sildenafil, and zinc sulfate  History Review  Allergy: [name] has No Known Allergies. Drug: [name]  reports no history of drug use. Alcohol:  reports current alcohol use of about 4.0 standard drinks of alcohol per week. Tobacco:  reports that he has never smoked. He has never used smokeless tobacco. Social: [name]  reports that he has never smoked. He has never used smokeless tobacco. He reports current alcohol use of about 4.0 standard drinks of alcohol per week. He reports that he does not use drugs. Medical:  has a past medical history of Anginal pain ([place]), Cancer ([place]), Coronary artery disease, COVID-19, Edema, Hypertension, Myocardial infarction ([place]), Obesity, and Varicose veins of both lower extremities. Surgical: [name]  has a past surgical history that includes Shoulder acromioplasty; Colonoscopy with propofol (N/A, 12/12/2015); Total hip arthroplasty (Bilateral); Knee surgery; Cardiac catheterization (2015); and Cataract extraction w/PHACO (Right, 11/30/2020). Family: family history includes Heart disease in his father; Obesity in his brother and brother.  Laboratory Chemistry Profile   Renal Lab Results  Component Value Date   BUN 20 02/21/2020   CREATININE 1.09 02/21/2020   GFRAA >60 02/21/2020   GFRNONAA >60 02/21/2020    Hepatic Lab Results  Component  Value Date   AST 18 03/10/2018   ALT 16 (L) 03/10/2018   ALBUMIN 3.2 (L) 03/10/2018   ALKPHOS 57 03/10/2018    Electrolytes Lab Results  Component Value Date   NA 143 02/21/2020   K 4.0 02/21/2020   CL 108 02/21/2020   CALCIUM 9.3 02/21/2020    Bone No results found for: "VD25OH", "VD125OH2TOT", "[id number]", "[id number]", "25OHVITD1", "25OHVITD2", "25OHVITD3", "TESTOFREE", "TESTOSTERONE"  Inflammation (CRP: Acute Phase) (ESR: Chronic Phase) No results found for: "CRP", "ESRSEDRATE", "LATICACIDVEN"       Note: Above Lab results reviewed.  Recent Imaging Review  MR ANKLE LEFT WO CONTRAST CLINICAL DATA:  Ankle pain. Tendon abnormality suspected. Lateral left ankle pain with pain into lateral proximal foot status post stepping off tractor 2 months ago and felt "tension" in left ankle. Woke up next morning in a lot of pain.  EXAM: MRI OF THE LEFT ANKLE WITHOUT CONTRAST  TECHNIQUE: Multiplanar, multisequence MR imaging of the ankle was performed. No intravenous contrast was administered.  COMPARISON:  Left foot radiographs 05/07/2012  FINDINGS: TENDONS  Peroneal: Mild to moderate peroneus longus and brevis tenosynovitis. Mild linear intermediate T2 signal within the peroneus brevis tendon just distal to the fibula (axial series 4 images 19 and 20 and coronal series 7, image 15), a tiny short-segment partial-thickness tear measuring only approximately 5 mm in length. There is moderate intermediate T2 signal thickening and irregularity of the peroneus longus tendon (axial series 4 images 18 through 28, coronal images 13 through 21), multiple longitudinal partial-thickness tears along an approximate 4 cm length of the tendon.  Posteromedial: Intact tibialis posterior, flexor digitorum longus, and flexor hallucis longus tendons.  Anterior: The tibialis anterior, extensor hallucis longus, and extensor digitorum longus tendons are intact.  Achilles: There is mild  longitudinal linear near fluid bright signal within the slightly medial aspect of the Achilles tendon midsubstance in a region measuring up to 5 mm in transverse dimension, 1.5 mm in AP dimension, and 24 mm in craniocaudal dimension (sagittal series 6, image 14 and axial series 4 images 13 through 17). This suggests a tiny midsubstance partial-thickness tear. No tendon retraction.  Plantar Fascia: Moderate plantar calcaneal heel spurs. No associated marrow edema. Mild chronic thickening of the medial and lateral bands of the plantar fascia. No acute plantar fasciitis. Moderate abductor hallucis and mild flexor digitorum brevis muscle edema, likely muscle strains.  LIGAMENTS  Lateral: The anterior and posterior talofibular, anterior and posterior tibiofibular, and calcaneofibular ligaments are intact.  Medial: There is mild irregularity and intermediate T2 signal scarring of the tibiotalar deep deltoid ligament. Punctate 2 mm anterior deep aspect of the deltoid ligament partial-thickness tear (coronal series 7, image 18).  CARTILAGE  Ankle Joint: Intact cartilage.  Subtalar Joints/Sinus Tarsi: Fat is preserved within sinus tarsi.  Bones: Mild dorsal talonavicular degenerative osteophytosis. Mild cartilage thinning and peripheral osteophytosis of the calcaneocuboid joint and the tarsometatarsal joints.  Other: The Lisfranc ligament complex is intact. The tarsal tunnel is unremarkable.  IMPRESSION:: IMPRESSION: 1. Mild-to-moderate peroneus longus and brevis tenosynovitis. There is a moderate partial-thickness longitudinal tear of the peroneus longus tendon along an approximate 4 cm length. Small partial-thickness peroneus brevis tendon tear just distal to the fibula, measuring only 5 mm in length. 2. Small, longitudinal, partial-thickness midsubstance tear within the slightly medial aspect of the distal Achilles tendon. No tendon retraction. 3. Moderate plantar calcaneal  heel spur. Moderate abductor hallucis and mild flexor digitorum brevis muscle edema, likely muscle strains. No plantar  fasciitis. 4. Chronic scarring and small partial-thickness tear within the tibiotalar deep deltoid ligament.  Electronically Signed   By: Yvonne Kendall M.D.   On: 03/02/2022 15:22 Note: Reviewed        Physical Exam  General appearance: Well nourished, well developed, and well hydrated. In no apparent acute distress Mental status: Alert, oriented x 3 (person, place, & time)       Respiratory: No evidence of acute respiratory distress Eyes: PERLA Vitals: BP (!) 185/89   Pulse (!) 48   Resp 18   Ht 6' (1.829 m)   Wt 235 lb (106.6 kg)   SpO2 98%   BMI 31.87 kg/m  BMI: Estimated body mass index is 31.87 kg/m as calculated from the following:   Height as of this encounter: 6' (1.829 m).   Weight as of this encounter: 235 lb (106.6 kg). Ideal: Ideal body weight: 77.6 kg (171 lb 1.2 oz) Adjusted ideal body weight: 89.2 kg (196 lb 10.3 oz)  Point tenderness at left transverse metatarsal, pain with inversion.  Assessment   Diagnosis Status  1. Arthritis of ankle or foot, degenerative, left   2. Pain of joint of left ankle and foot    Having a Flare-up Having a Flare-up    Updated Problems: No problems updated.  Plan of Care   Return for left ultrasound-guided TMT small joint injection.  Risk and benefits reviewed and patient would like to proceed.   Orders:  Orders Placed This Encounter  Procedures   Small Joint Injection/Arthrocentesis    Standing Status:   Future    Standing Expiration Date:   08/23/2022    Scheduling Instructions:     LEFT TMT small joint injection under US guidance without sedation   Follow-up plan:   Return in about 5 days (around 05/28/2022) for Left TMT joint injection under US guidance, in clinic NS.     Status post left L5-S1 ESI #1 on 05/06/2019, and #2 on 06/17/2019 as well as left sacroiliac joint injection #1 on  06/17/2019: Right SI joint injection, right piriformis injection on 04/20/2020-helped significantly; LEFT SI joint injection, LEFT piriformis injection on 05/30/20 helped significantly, repeat as needed.  08/08/2020: Diagnostic bilateral lumbar facet medial branch nerve blocks at L3, L4, L5.  Bilateral sacroiliac joint injection       Recent Visits No visits were found meeting these conditions. Showing recent visits within past 90 days and meeting all other requirements Today's Visits Date Type Provider Dept  05/23/22 Office Visit Gillis Santa, MD [facility]  Showing today's visits and meeting all other requirements Future Appointments Date Type Provider Dept  05/28/22 Appointment Gillis Santa, MD [facility]  Showing future appointments within next 90 days and meeting all other requirements  I discussed the assessment and treatment plan with the patient. The patient was provided an opportunity to ask questions and all were answered. The patient agreed with the plan and demonstrated an understanding of the instructions.  Patient advised to call back or seek an in-person evaluation if the symptoms or condition worsens.  Duration of encounter: 63mnutes.  Total time on encounter, as per AMA guidelines included both the face-to-face and non-face-to-face time personally spent by the physician and/or other qualified health care professional(s) on the day of the encounter (includes time in activities that require the physician or other qualified health care professional and does not include time in activities normally performed by clinical staff). Physician's time may include the following activities when performed:  preparing to see the patient (eg, review of tests, pre-charting review of records) obtaining and/or reviewing separately obtained history performing a medically appropriate examination and/or evaluation counseling and educating the patient/family/caregiver ordering  medications, tests, or procedures referring and communicating with other health care professionals (when not separately reported) documenting clinical information in the electronic or other health record independently interpreting results (not separately reported) and communicating results to the patient/ family/caregiver care coordination (not separately reported)  Note by: Gillis Santa, MD Date: 05/23/2022; Time: 1:49 PM

## 2022-05-23 NOTE — Progress Notes (Signed)
Safety precautions to be maintained throughout the outpatient stay will include: orient to surroundings, keep bed in low position, maintain call bell within reach at all times, provide assistance with transfer out of bed and ambulation.  

## 2022-05-28 ENCOUNTER — Ambulatory Visit
Payer: PPO | Attending: Student in an Organized Health Care Education/Training Program | Admitting: Student in an Organized Health Care Education/Training Program

## 2022-05-28 ENCOUNTER — Encounter: Payer: Self-pay | Admitting: Student in an Organized Health Care Education/Training Program

## 2022-05-28 VITALS — BP 149/92 | HR 62 | Temp 97.4°F | Resp 18 | Ht 72.0 in | Wt 230.0 lb

## 2022-05-28 DIAGNOSIS — M25572 Pain in left ankle and joints of left foot: Secondary | ICD-10-CM | POA: Insufficient documentation

## 2022-05-28 DIAGNOSIS — M19072 Primary osteoarthritis, left ankle and foot: Secondary | ICD-10-CM | POA: Insufficient documentation

## 2022-05-28 MED ORDER — DEXAMETHASONE SODIUM PHOSPHATE 10 MG/ML IJ SOLN
10.0000 mg | Freq: Once | INTRAMUSCULAR | Status: AC
  Administered 2022-05-28: 10 mg
  Filled 2022-05-28: qty 1

## 2022-05-28 MED ORDER — LIDOCAINE HCL 2 % IJ SOLN
20.0000 mL | Freq: Once | INTRAMUSCULAR | Status: AC
  Administered 2022-05-28: 400 mg
  Filled 2022-05-28: qty 40

## 2022-05-28 NOTE — Progress Notes (Signed)
PROVIDER NOTE: Interpretation of information contained herein should be left to medically-trained personnel. Specific patient instructions are provided elsewhere under "Patient Instructions" section of medical record. This document was created in part using STT-dictation technology, any transcriptional errors that may result from this process are unintentional.  Patient: Ricardo Winters. Type: Established DOB: Mar 01, 1946 MRN: [id number] PCP: Rusty Aus, MD  Service: Procedure DOS: 05/28/2022 Setting: Ambulatory Location: Ambulatory outpatient facility Delivery: Face-to-face Provider: Gillis Santa, MD Specialty: Interventional Pain Management Specialty designation: 09 Location: Outpatient facility Ref. Prov.: Rusty Aus, MD    Primary Reason for Visit: Interventional Pain Management Treatment. CC: Ankle Pain (left)    Procedure:          Anesthesia, Analgesia, Anxiolysis:  Type: Diagnostic True Ankle Steroid Injection 1st TMT #5 under ultrasound guidance Region: Dorsal Tarsometatarsal (TMT) joint       LEFT  Type: Local Anesthesia Local Anesthetic: Lidocaine 1-2% Sedation: None  Indication(s):  Analgesia Route: Infiltration (Turner/IM) IV Access: N/A   Position: Supine   1. Arthritis of ankle or foot, degenerative, left   2. Pain of joint of left ankle and foot    NAS-11 Pain score:   Pre-procedure: 5 /10   Post-procedure: 5 /10     Pre-op H&P Assessment:  Ricardo Winters is a 76 y.o. (year old), male patient, seen today for interventional treatment. He  has a past surgical history that includes Shoulder acromioplasty; Colonoscopy with propofol (N/A, 12/12/2015); Total hip arthroplasty (Bilateral); Knee surgery; Cardiac catheterization (2015); and Cataract extraction w/PHACO (Right, 11/30/2020). Ricardo Winters has a current medication list which includes the following prescription(s): vitamin c, aspirin ec, black pepper-turmeric, vitamin d3, collagen, grape seed, losartan,  nitroglycerin, omega-3, quercetin, sildenafil, cyanocobalamin, zinc sulfate, ezetimibe, and ferrous gluconate. His primarily concern today is the Ankle Pain (left)  Initial Vital Signs:  Pulse/HCG Rate: 62  Temp: (!) 97.4 F (36.3 C) Resp: 18 BP: (!) 149/92 SpO2: 100 %  BMI: Estimated body mass index is 31.19 kg/m as calculated from the following:   Height as of this encounter: 6' (1.829 m).   Weight as of this encounter: 230 lb (104.3 kg).  Risk Assessment: Allergies: Reviewed. He has No Known Allergies.  Allergy Precautions: None required Coagulopathies: Reviewed. None identified.  Blood-thinner therapy: None at this time Active Infection(s): Reviewed. None identified. Ricardo Winters is afebrile  Site Confirmation: Ricardo Winters was asked to confirm the procedure and laterality before marking the site Procedure checklist: Completed Consent: Before the procedure and under the influence of no sedative(s), amnesic(s), or anxiolytics, the patient was informed of the treatment options, risks and possible complications. To fulfill our ethical and legal obligations, as recommended by the American Medical Association's Code of Ethics, I have informed the patient of my clinical impression; the nature and purpose of the treatment or procedure; the risks, benefits, and possible complications of the intervention; the alternatives, including doing nothing; the risk(s) and benefit(s) of the alternative treatment(s) or procedure(s); and the risk(s) and benefit(s) of doing nothing. The patient was provided information about the general risks and possible complications associated with the procedure. These may include, but are not limited to: failure to achieve desired goals, infection, bleeding, organ or nerve damage, allergic reactions, paralysis, and death. In addition, the patient was informed of those risks and complications associated to the procedure, such as failure to decrease pain; infection; bleeding;  organ or nerve damage with subsequent damage to sensory, motor, and/or autonomic systems, resulting in permanent pain, numbness,  and/or weakness of one or several areas of the body; allergic reactions; (i.e.: anaphylactic reaction); and/or death. Furthermore, the patient was informed of those risks and complications associated with the medications. These include, but are not limited to: allergic reactions (i.e.: anaphylactic or anaphylactoid reaction(s)); adrenal axis suppression; blood sugar elevation that in diabetics may result in ketoacidosis or comma; water retention that in patients with history of congestive heart failure may result in shortness of breath, pulmonary edema, and decompensation with resultant heart failure; weight gain; swelling or edema; medication-induced neural toxicity; particulate matter embolism and blood vessel occlusion with resultant organ, and/or nervous system infarction; and/or aseptic necrosis of one or more joints. Finally, the patient was informed that Medicine is not an exact science; therefore, there is also the possibility of unforeseen or unpredictable risks and/or possible complications that may result in a catastrophic outcome. The patient indicated having understood very clearly. We have given the patient no guarantees and we have made no promises. Enough time was given to the patient to ask questions, all of which were answered to the patient's satisfaction. Ricardo Winters has indicated that he wanted to continue with the procedure. Attestation: I, the ordering provider, attest that I have discussed with the patient the benefits, risks, side-effects, alternatives, likelihood of achieving goals, and potential problems during recovery for the procedure that I have provided informed consent. Date  Time: 05/28/2022 12:59 PM  Pre-Procedure Preparation:  Monitoring: As per clinic protocol. Respiration, ETCO2, SpO2, BP, heart rate and rhythm monitor placed and checked for  adequate function Safety Precautions: Patient was assessed for positional comfort and pressure points before starting the procedure. Time-out: I initiated and conducted the "Time-out" before starting the procedure, as per protocol. The patient was asked to participate by confirming the accuracy of the "Time Out" information. Verification of the correct person, site, and procedure were performed and confirmed by me, the nursing staff, and the patient. "Time-out" conducted as per Joint Commission's Universal Protocol (UP.01.01.01). Time: 1326  Description of Procedure:          Approach: Anterolateral approach. Area Prepped: Entire dorsal foot Region DuraPrep (Iodine Povacrylex [0.7% available iodine] and Isopropyl Alcohol, 74% w/w) Safety Precautions: Aspiration looking for blood return was conducted prior to all injections. At no point did we inject any substances, as a needle was being advanced. No attempts were made at seeking any paresthesias. Safe injection practices and needle disposal techniques used. Medications properly checked for expiration dates. SDV (single dose vial) medications used. Description of the Procedure: Protocol guidelines were followed. The patient was placed in position. The target area was identified and the area prepped in the usual manner. Skin & deeper tissues infiltrated with local anesthetic. Appropriate amount of time allowed to pass for local anesthetics to take effect. The procedure needles were then advanced to the target area. Proper needle placement secured. Negative aspiration confirmed. Solution injected in intermittent fashion, asking for systemic symptoms every 0.5cc of injectate. The needles were then removed and the area cleansed, making sure to leave some of the prepping solution back to take advantage of its long term bactericidal properties.                          Vitals:   05/28/22 1309  BP: (!) 149/92  Pulse: 62  Resp: 18  Temp: (!) 97.4 F  (36.3 C)  TempSrc: Temporal  SpO2: 100%  Weight: 230 lb (104.3 kg)  Height: 6' (1.829 m)  Start Time: 1326 hrs. End Time: 1330 hrs.  Materials:  Needle(s) Type: Spinal Needle Gauge: 22G Length: 3.5-in Medication(s): Please see orders for medications and dosing details.  Imaging Guidance:          Type of Imaging Technique: Ultrasound Guidance Indication(s): Assistance in needle guidance and placement for procedures requiring needle placement in or near specific anatomical locations not easily accessible without such assistance. Exposure Time: No patient exposure Contrast: None used. Fluoroscopic Guidance: N/A Ultrasound Guidance: YES, saved in butterfly Interpretation: N/A  Antibiotic Prophylaxis:   Anti-infectives (From admission, onward)    None      Indication(s): None identified  Post-operative Assessment:  Post-procedure Vital Signs:  Pulse/HCG Rate: 62  Temp:  (!) 97.4 F (36.3 C) Resp: 18 BP:  (!) 149/92 SpO2: 100 %  EBL: None  Complications: No immediate post-treatment complications observed by team, or reported by patient.  Note: The patient tolerated the entire procedure well. A repeat set of vitals were taken after the procedure and the patient was kept under observation following institutional policy, for this type of procedure. Post-procedural neurological assessment was performed, showing return to baseline, prior to discharge. The patient was provided with post-procedure discharge instructions, including a section on how to identify potential problems. Should any problems arise concerning this procedure, the patient was given instructions to immediately contact us, at any time, without hesitation. In any case, we plan to contact the patient by telephone for a follow-up status report regarding this interventional procedure.  Comments:  No additional relevant information.  Plan of Care  Orders:  No orders of the defined types were placed in this  encounter.   Medications ordered for procedure: Meds ordered this encounter  Medications   lidocaine (XYLOCAINE) 2 % (with pres) injection 400 mg   dexamethasone (DECADRON) injection 10 mg   Medications administered: We administered lidocaine and dexamethasone.  See the medical record for exact dosing, route, and time of administration.  Follow-up plan:   Return in about 4 weeks (around 06/25/2022) for PPE VV.          Recent Visits Date Type Provider Dept  05/23/22 Office Visit Gillis Santa, MD [facility]  Showing recent visits within past 90 days and meeting all other requirements Today's Visits Date Type Provider Dept  05/28/22 Procedure visit Gillis Santa, MD [facility]  Showing today's visits and meeting all other requirements Future Appointments Date Type Provider Dept  06/27/22 Appointment Gillis Santa, MD [facility]  Showing future appointments within next 90 days and meeting all other requirements  Disposition: Discharge home  Discharge (Date  Time): 05/28/2022; 1340 hrs.   Primary Care Physician: Rusty Aus, MD Location: [facility] Note by: Gillis Santa, MD Date: 05/28/2022; Time: 2:11 PM  Disclaimer:  Medicine is not an Chief Strategy Officer. The only guarantee in medicine is that nothing is guaranteed. It is important to note that the decision to proceed with this intervention was based on the information collected from the patient. The Data and conclusions were drawn from the patient's questionnaire, the interview, and the physical examination. Because the information was provided in large part by the patient, it cannot be guaranteed that it has not been purposely or unconsciously manipulated. Every effort has been made to obtain as much relevant data as possible for this evaluation. It is important to note that the conclusions that lead to this procedure are derived in large part from the available  data. Always  take into account that the treatment will also be dependent on availability of resources and existing treatment guidelines, considered by other Pain Management Practitioners as being common knowledge and practice, at the time of the intervention. For Medico-Legal purposes, it is also important to point out that variation in procedural techniques and pharmacological choices are the acceptable norm. The indications, contraindications, technique, and results of the above procedure should only be interpreted and judged by a Board-Certified Interventional Pain Specialist with extensive familiarity and expertise in the same exact procedure and technique.

## 2022-05-28 NOTE — Progress Notes (Signed)
Safety precautions to be maintained throughout the outpatient stay will include: orient to surroundings, keep bed in low position, maintain call bell within reach at all times, provide assistance with transfer out of bed and ambulation.  

## 2022-05-29 ENCOUNTER — Telehealth: Payer: Self-pay

## 2022-05-29 NOTE — Telephone Encounter (Signed)
Patient states that he is doing well. No problems. Instructed to call if needed.

## 2022-06-06 ENCOUNTER — Emergency Department: Payer: PPO

## 2022-06-06 ENCOUNTER — Emergency Department
Admission: EM | Admit: 2022-06-06 | Discharge: 2022-06-07 | Disposition: A | Discharge status: Home / Self Care | Payer: PPO | Attending: Emergency Medicine | Admitting: Emergency Medicine

## 2022-06-06 ENCOUNTER — Telehealth: Payer: Self-pay | Admitting: Cardiovascular Disease

## 2022-06-06 ENCOUNTER — Encounter: Payer: PPO | Admitting: Podiatry

## 2022-06-06 DIAGNOSIS — R0789 Other chest pain: Secondary | ICD-10-CM | POA: Diagnosis not present

## 2022-06-06 DIAGNOSIS — R079 Chest pain, unspecified: Secondary | ICD-10-CM | POA: Diagnosis not present

## 2022-06-06 LAB — BASIC METABOLIC PANEL
Anion gap: 6 (ref 5–15)
BUN: 24 mg/dL — ABNORMAL HIGH (ref 8–23)
CO2: 25 mmol/L (ref 22–32)
Calcium: 9.1 mg/dL (ref 8.9–10.3)
Chloride: 109 mmol/L (ref 98–111)
Creatinine, Ser: 1.26 mg/dL — ABNORMAL HIGH (ref 0.61–1.24)
GFR, Estimated: 60 mL/min — ABNORMAL LOW (ref >60)
Glucose, Bld: 104 mg/dL — ABNORMAL HIGH (ref 70–99)
Potassium: 3.9 mmol/L (ref 3.5–5.1)
Sodium: 140 mmol/L (ref 135–145)

## 2022-06-06 LAB — CBC
HCT: 42.3 % (ref 39.0–52.0)
Hemoglobin: 14.5 g/dL (ref 13.0–17.0)
MCH: 32.7 pg (ref 26.0–34.0)
MCHC: 34.3 g/dL (ref 30.0–36.0)
MCV: 95.5 fL (ref 80.0–100.0)
Platelets: 175 10*3/uL (ref 150–400)
RBC: 4.43 MIL/uL (ref 4.22–5.81)
RDW: 12.1 % (ref 11.5–15.5)
WBC: 8 10*3/uL (ref 4.0–10.5)
nRBC: 0 % (ref 0.0–0.2)

## 2022-06-06 LAB — TROPONIN I (HIGH SENSITIVITY)
Troponin I (High Sensitivity): 6 ng/L (ref <18)
Troponin I (High Sensitivity): 7 ng/L (ref <18)

## 2022-06-06 MED ORDER — IOHEXOL 350 MG/ML SOLN
75.0000 mL | Freq: Once | INTRAVENOUS | Status: AC | PRN
  Administered 2022-06-06: 75 mL via INTRAVENOUS

## 2022-06-06 NOTE — Discharge Instructions (Signed)
Please seek medical attention for any high fevers, chest pain, shortness of breath, change in behavior, persistent vomiting, bloody stool or any other new or concerning symptoms.  

## 2022-06-06 NOTE — Telephone Encounter (Signed)
Left voicemail message to call back for review of his symptoms.

## 2022-06-06 NOTE — ED Provider Notes (Signed)
[facility] Provider Note    Event Date/Time   First MD Initiated Contact with Patient 06/06/22 2147     (approximate)   History   Chest Pain   HPI {Remember to add pertinent medical, surgical, social, and/or OB history to HPI:1} Ricardo Winters. is a 76 y.o. male  ***       Physical Exam   Triage Vital Signs: ED Triage Vitals [06/06/22 1617]  Enc Vitals Group     BP (!) 143/93     Pulse Rate 87     Resp 18     Temp 100.1 F (37.8 C)     Temp Source Oral     SpO2 95 %     Weight 232 lb (105.2 kg)     Height      Head Circumference      Peak Flow      Pain Score 4     Pain Loc      Pain Edu?      Excl. in [place]?     Most recent vital signs: Vitals:   06/06/22 1617 06/06/22 2057  BP: (!) 143/93 (!) 212/103  Pulse: 87 66  Resp: 18 18  Temp: 100.1 F (37.8 C) 97.7 F (36.5 C)  SpO2: 95% 98%    {Only need to document appropriate and relevant physical exam:1} General: Awake, no distress. *** CV:  Good peripheral perfusion. *** Resp:  Normal effort. *** Abd:  No distention. *** Other:  ***   ED Results / Procedures / Treatments   Labs (all labs ordered are listed, but only abnormal results are displayed) Labs Reviewed  BASIC METABOLIC PANEL - Abnormal; Notable for the following components:      Result Value   Glucose, Bld 104 (*)    BUN 24 (*)    Creatinine, Ser 1.26 (*)    GFR, Estimated 60 (*)    All other components within normal limits  CBC  TROPONIN I (HIGH SENSITIVITY)  TROPONIN I (HIGH SENSITIVITY)     EKG  I, Nance Pear, attending physician, personally viewed and interpreted this EKG  EKG Time: 1618 Rate: 83 Rhythm: sinus rhythm with 1st degree av block Axis: normal Intervals: qtc 432 QRS: narrow, q waves v1, v2 ST changes: no st elevation Impression: abnormal ekg    RADIOLOGY I independently interpreted and visualized the CXR. My interpretation: No pneumonia. No pneumothorax.  Radiology  interpretation:  IMPRESSION:  No active cardiopulmonary disease.      PROCEDURES:  Critical Care performed: {CriticalCareYesNo:19197::"Yes, see critical care procedure note(s)","No"}  Procedures   MEDICATIONS ORDERED IN ED: Medications - No data to display   IMPRESSION / MDM / [place] / ED COURSE  I reviewed the triage vital signs and the nursing notes.                              Differential diagnosis includes, but is not limited to, ***  Patient's presentation is most consistent with {EM COPA:27473}  {If the patient is on the monitor, remove the brackets and asterisks on the sentence below and remember to document it as a Procedure as well. Otherwise delete the sentence below:1} {**The patient is on the cardiac monitor to evaluate for evidence of arrhythmia and/or significant heart rate changes.**} {Remember to include, when applicable, any/all of the following data: independent review of imaging independent review of labs (comment specifically on pertinent  positives and negatives) review of specific prior hospitalizations, PCP/specialist notes, etc. discuss meds given and prescribed document any discussion with consultants (including hospitalists) any clinical decision tools you used and why (PECARN, NEXUS, etc.) did you consider admitting the patient? document social determinants of health affecting patient's care (homelessness, inability to follow up in a timely fashion, etc) document any pre-existing conditions increasing risk on current visit (e.g. diabetes and HTN increasing danger of high-risk chest pain/ACS) describes what meds you gave (especially parenteral) and why any other interventions?:1}     FINAL CLINICAL IMPRESSION(S) / ED DIAGNOSES   Final diagnoses:  None     Rx / DC Orders   ED Discharge Orders     None        Note:  This document was prepared using Dragon voice recognition software and may include unintentional  dictation errors.

## 2022-06-06 NOTE — Telephone Encounter (Signed)
Pt c/o of Chest Pain: STAT if CP now or developed within 24 hours  1. Are you having CP right now?  Yes  2. Are you experiencing any other symptoms (ex. SOB, nausea, vomiting, sweating)?   No  3. How long have you been experiencing CP?   Started this morning  4. Is your CP continuous or coming and going?   Continuous  5. Have you taken Nitroglycerin? No   Patient stated he is experiencing a weird tightness in his chest neck area. ?

## 2022-06-06 NOTE — ED Triage Notes (Signed)
Pt sts that she woke up with AM around 0600 with CP. Pt sts that he thought is was indigestion. Pt went and played nines holes of golf and than came in due to the pain not stopping.

## 2022-06-06 NOTE — Telephone Encounter (Signed)
Spoke with patient and he reports chest tightness that goes into his neck. This has been continuous since this morning. Instructed patient to proceed to ED for further work up and evaluation. He verbalized understanding with no further questions at this time.

## 2022-06-06 NOTE — ED Provider Triage Note (Signed)
  Emergency Medicine Provider Triage Evaluation Note  Ricardo Winters. , a 76 y.o.male,  was evaluated in triage.  Pt complains of chest pain that started at approximately 0 600 today.  Described as centralized with radiation into his neck.  Reports feeling like indigestion.   Review of Systems  Positive: Chest pain Negative: Denies fever, abdominal pain, vomiting  Physical Exam   Vitals:   06/06/22 1617  BP: (!) 143/93  Pulse: 87  Resp: 18  Temp: 100.1 F (37.8 C)  SpO2: 95%   Gen:   Awake, no distress   Resp:  Normal effort  MSK:   Moves extremities without difficulty  Other:    Medical Decision Making  Given the patient's initial medical screening exam, the following diagnostic evaluation has been ordered. The patient will be placed in the appropriate treatment space, once one is available, to complete the evaluation and treatment. I have discussed the plan of care with the patient and I have advised the patient that an ED physician or mid-level practitioner will reevaluate their condition after the test results have been received, as the results may give them additional insight into the type of treatment they may need.    Diagnostics: Labs, EKG, CXR  Treatments: none immediately   Teodoro Spray, [place] 06/06/22 2156

## 2022-06-06 NOTE — ED Provider Notes (Incomplete)
[facility] Provider Note    Event Date/Time   First MD Initiated Contact with Patient 06/06/22 2147     (approximate)   History   Chest Pain   HPI {Remember to add pertinent medical, surgical, social, and/or OB history to HPI:1} Ricardo Winters. is a 76 y.o. male  ***       Physical Exam   Triage Vital Signs: ED Triage Vitals [06/06/22 1617]  Enc Vitals Group     BP (!) 143/93     Pulse Rate 87     Resp 18     Temp 100.1 F (37.8 C)     Temp Source Oral     SpO2 95 %     Weight 232 lb (105.2 kg)     Height      Head Circumference      Peak Flow      Pain Score 4     Pain Loc      Pain Edu?      Excl. in [place]?     Most recent vital signs: Vitals:   06/06/22 1617 06/06/22 2057  BP: (!) 143/93 (!) 212/103  Pulse: 87 66  Resp: 18 18  Temp: 100.1 F (37.8 C) 97.7 F (36.5 C)  SpO2: 95% 98%    {Only need to document appropriate and relevant physical exam:1} General: Awake, no distress. *** CV:  Good peripheral perfusion. *** Resp:  Normal effort. *** Abd:  No distention. *** Other:  ***   ED Results / Procedures / Treatments   Labs (all labs ordered are listed, but only abnormal results are displayed) Labs Reviewed  BASIC METABOLIC PANEL - Abnormal; Notable for the following components:      Result Value   Glucose, Bld 104 (*)    BUN 24 (*)    Creatinine, Ser 1.26 (*)    GFR, Estimated 60 (*)    All other components within normal limits  CBC  TROPONIN I (HIGH SENSITIVITY)  TROPONIN I (HIGH SENSITIVITY)     EKG  I, Nance Pear, attending physician, personally viewed and interpreted this EKG  EKG Time: 1618 Rate: 83 Rhythm: sinus rhythm with 1st degree av block Axis: normal Intervals: qtc 432 QRS: narrow, q waves v1, v2 ST changes: no st elevation Impression: abnormal ekg    RADIOLOGY I independently interpreted and visualized the CXR. My interpretation: No pneumonia. No pneumothorax.  Radiology  interpretation:  IMPRESSION:  No active cardiopulmonary disease.    I independently interpreted and visualized the CT angio PE. My interpretation: No large PE Radiology interpretation:  IMPRESSION:  1. No pulmonary embolus. Left lung base atelectasis, no other acute  intrathoracic abnormality.  2. Aortic atherosclerosis and coronary artery calcifications.  3. Incidental findings in the upper abdomen of gallstones or sludge  in the gallbladder without CT findings of acute cholecystitis.     PROCEDURES:  Critical Care performed: No  Procedures   MEDICATIONS ORDERED IN ED: Medications - No data to display   IMPRESSION / MDM / [place] / ED COURSE  I reviewed the triage vital signs and the nursing notes.                              Differential diagnosis includes, but is not limited to, ***  Patient's presentation is most consistent with {EM COPA:27473}  {If the patient is on the monitor, remove the brackets and  asterisks on the sentence below and remember to document it as a Procedure as well. Otherwise delete the sentence below:1} {**The patient is on the cardiac monitor to evaluate for evidence of arrhythmia and/or significant heart rate changes.**} {Remember to include, when applicable, any/all of the following data: independent review of imaging independent review of labs (comment specifically on pertinent positives and negatives) review of specific prior hospitalizations, PCP/specialist notes, etc. discuss meds given and prescribed document any discussion with consultants (including hospitalists) any clinical decision tools you used and why (PECARN, NEXUS, etc.) did you consider admitting the patient? document social determinants of health affecting patient's care (homelessness, inability to follow up in a timely fashion, etc) document any pre-existing conditions increasing risk on current visit (e.g. diabetes and HTN increasing danger of high-risk chest  pain/ACS) describes what meds you gave (especially parenteral) and why any other interventions?:1}     FINAL CLINICAL IMPRESSION(S) / ED DIAGNOSES   Final diagnoses:  None     Rx / DC Orders   ED Discharge Orders     None        Note:  This document was prepared using Dragon voice recognition software and may include unintentional dictation errors.

## 2022-06-18 DIAGNOSIS — M2170 Unequal limb length (acquired), unspecified site: Secondary | ICD-10-CM | POA: Diagnosis not present

## 2022-06-27 ENCOUNTER — Telehealth: Payer: PPO | Admitting: Student in an Organized Health Care Education/Training Program

## 2022-06-27 ENCOUNTER — Encounter: Payer: PPO | Admitting: Podiatry

## 2022-07-11 ENCOUNTER — Ambulatory Visit: Payer: PPO | Admitting: Dermatology

## 2022-07-11 DIAGNOSIS — D239 Other benign neoplasm of skin, unspecified: Secondary | ICD-10-CM

## 2022-07-11 DIAGNOSIS — I8393 Asymptomatic varicose veins of bilateral lower extremities: Secondary | ICD-10-CM

## 2022-07-11 DIAGNOSIS — L578 Other skin changes due to chronic exposure to nonionizing radiation: Secondary | ICD-10-CM

## 2022-07-11 DIAGNOSIS — Z1283 Encounter for screening for malignant neoplasm of skin: Secondary | ICD-10-CM

## 2022-07-11 DIAGNOSIS — D1801 Hemangioma of skin and subcutaneous tissue: Secondary | ICD-10-CM | POA: Diagnosis not present

## 2022-07-11 DIAGNOSIS — Z85828 Personal history of other malignant neoplasm of skin: Secondary | ICD-10-CM | POA: Diagnosis not present

## 2022-07-11 DIAGNOSIS — D2371 Other benign neoplasm of skin of right lower limb, including hip: Secondary | ICD-10-CM

## 2022-07-11 DIAGNOSIS — D229 Melanocytic nevi, unspecified: Secondary | ICD-10-CM

## 2022-07-11 DIAGNOSIS — L570 Actinic keratosis: Secondary | ICD-10-CM | POA: Diagnosis not present

## 2022-07-11 DIAGNOSIS — L719 Rosacea, unspecified: Secondary | ICD-10-CM | POA: Diagnosis not present

## 2022-07-11 DIAGNOSIS — D692 Other nonthrombocytopenic purpura: Secondary | ICD-10-CM | POA: Diagnosis not present

## 2022-07-11 DIAGNOSIS — L814 Other melanin hyperpigmentation: Secondary | ICD-10-CM

## 2022-07-11 NOTE — Patient Instructions (Addendum)
Discussed the treatment option of BBL/laser.  Typically we recommend 1-3 treatment sessions about 5-8 weeks apart for best results.  The patient's condition may require "maintenance treatments" in the future.  The fee for BBL / laser treatments is $350 per treatment session for the whole face.  A fee can be quoted for other parts of the body. Insurance typically does not pay for BBL/laser treatments and therefore the fee is an out-of-pocket cost.   Cryotherapy Aftercare  Wash gently with soap and water everyday.   Apply Vaseline and Band-Aid daily until healed.    Due to recent changes in healthcare laws, you may see results of your pathology and/or laboratory studies on MyChart before the doctors have had a chance to review them. We understand that in some cases there may be results that are confusing or concerning to you. Please understand that not all results are received at the same time and often the doctors may need to interpret multiple results in order to provide you with the best plan of care or course of treatment. Therefore, we ask that you please give [place] 2 business days to thoroughly review all your results before contacting the office for clarification. Should we see a critical lab result, you will be contacted sooner.   If You Need Anything After Your Visit  If you have any questions or concerns for your doctor, please call our main line at [phone] and press option 4 to reach your doctor's medical assistant. If no one answers, please leave a voicemail as directed and we will return your call as soon as possible. Messages left after 4 pm will be answered the following business day.   You may also send [place] a message via [place]. We typically respond to MyChart messages within 1-2 business days.  For prescription refills, please ask your pharmacy to contact our office. Our fax number is [phone].  If you have an urgent issue when the clinic is closed that cannot wait until the  next business day, you can page your doctor at the number below.    Please note that while we do our best to be available for urgent issues outside of office hours, we are not available 24/7.   If you have an urgent issue and are unable to reach [place], you may choose to seek medical care at your doctor's office, retail clinic, urgent care center, or emergency room.  If you have a medical emergency, please immediately call 911 or go to the emergency department.  Pager Numbers  - Dr. Nehemiah Massed: [phone]  - Dr. Laurence Ferrari: [phone]  - Dr. Nicole Kindred: [phone]  In the event of inclement weather, please call our main line at [phone] for an update on the status of any delays or closures.  Dermatology Medication Tips: Please keep the boxes that topical medications come in in order to help keep track of the instructions about where and how to use these. Pharmacies typically print the medication instructions only on the boxes and not directly on the medication tubes.   If your medication is too expensive, please contact our office at [phone] option 4 or send [place] a message through [place].   We are unable to tell what your co-pay for medications will be in advance as this is different depending on your insurance coverage. However, we may be able to find a substitute medication at lower cost or fill out paperwork to get insurance to cover a needed medication.   If a prior authorization is  required to get your medication covered by your insurance company, please allow [place] 1-2 business days to complete this process.  Drug prices often vary depending on where the prescription is filled and some pharmacies may offer cheaper prices.  The website www.goodrx.com contains coupons for medications through different pharmacies. The prices here do not account for what the cost may be with help from insurance (it may be cheaper with your insurance), but the website can give you the price if you did not  use any insurance.  - You can print the associated coupon and take it with your prescription to the pharmacy.  - You may also stop by our office during regular business hours and pick up a GoodRx coupon card.  - If you need your prescription sent electronically to a different pharmacy, notify our office through [facility] or by phone at [phone] option 4.     Si Usted Necesita Algo Despus de Su Visita  Tambin puede enviarnos un mensaje a travs de Pharmacist, community. Por lo general respondemos a los mensajes de MyChart en el transcurso de 1 a 2 das hbiles.  Para renovar recetas, por favor pida a su farmacia que se ponga en contacto con nuestra oficina. Harland Dingwall de fax es [place] [phone].  Si tiene un asunto urgente cuando la clnica est cerrada y que no puede esperar hasta el siguiente da hbil, puede llamar/localizar a su doctor(a) al nmero que aparece a continuacin.   Por favor, tenga en cuenta que aunque hacemos todo lo posible para estar disponibles para asuntos urgentes fuera del horario de [place], no estamos disponibles las 24 horas del da, los 7 das de la [place].   Si tiene un problema urgente y no puede comunicarse con nosotros, puede optar por buscar atencin mdica  en el consultorio de su doctor(a), en una clnica privada, en un centro de atencin urgente o en una sala de emergencias.  Si tiene Engineering geologist, por favor llame inmediatamente al 911 o vaya a la sala de emergencias.  Nmeros de bper  - Dr. Nehemiah Massed: [phone]  - Dra. Moye: [phone]  - Dra. Nicole Kindred: [phone]  En caso de inclemencias del [place], por favor llame a Johnsie Kindred principal al [phone] para una actualizacin sobre el [place] de cualquier retraso o cierre.  Consejos para la medicacin en dermatologa: Por favor, guarde las cajas en las que vienen los medicamentos de uso tpico para ayudarle a seguir las instrucciones sobre dnde y cmo usarlos. Las farmacias  generalmente imprimen las instrucciones del medicamento slo en las cajas y no directamente en los tubos del [place].   Si su medicamento es muy caro, por favor, pngase en contacto con Zigmund Daniel llamando al [phone] y presione la opcin 4 o envenos un mensaje a travs de Pharmacist, community.   No podemos decirle cul ser su copago por los medicamentos por adelantado ya que esto es diferente dependiendo de la cobertura de su seguro. Sin embargo, es posible que podamos encontrar un medicamento sustituto a Electrical engineer un formulario para que el seguro cubra el medicamento que se considera necesario.   Si se requiere una autorizacin previa para que su compaa de seguros [place] su medicamento, por favor permtanos de 1 a 2 das hbiles para completar este proceso.  Los precios de los medicamentos varan con frecuencia dependiendo del Environmental consultant de dnde se surte la receta y alguna farmacias pueden ofrecer precios ms baratos.  El sitio web www.goodrx.com tiene cupones para medicamentos de  diferentes farmacias. Los precios aqu no tienen en cuenta lo que podra costar con la ayuda del seguro (puede ser ms barato con su seguro), pero el sitio web puede darle el precio si no utiliz Research scientist (physical sciences).  - Puede imprimir el cupn correspondiente y llevarlo con su receta a la farmacia.  - Tambin puede pasar por nuestra oficina durante el horario de atencin regular y Charity fundraiser una tarjeta de cupones de GoodRx.  - Si necesita que su receta se enve electrnicamente a una farmacia diferente, informe a nuestra oficina a travs de MyChart de [place] o por telfono llamando al [phone] y presione la opcin 4.

## 2022-07-11 NOTE — Progress Notes (Signed)
Follow-Up Visit   Subjective  Ricardo Winters. is a 76 y.o. male who presents for the following: Annual Exam (History of skin cancer of nose years ago - The patient presents for Total-Body Skin Exam (TBSE) for skin cancer screening and mole check.  The patient has spots, moles and lesions to be evaluated, some may be new or changing and the patient has concerns that these could be cancer./).  The following portions of the chart were reviewed this encounter and updated as appropriate:   Tobacco  Allergies  Meds  Problems  Med Hx  Surg Hx  Fam Hx     Review of Systems:  No other skin or systemic complaints except as noted in HPI or Assessment and Plan.  Objective  Well appearing patient in no apparent distress; mood and affect are within normal limits.  A full examination was performed including scalp, head, eyes, ears, nose, lips, neck, chest, axillae, abdomen, back, buttocks, bilateral upper extremities, bilateral lower extremities, hands, feet, fingers, toes, fingernails, and toenails. All findings within normal limits unless otherwise noted below.  Scalp x 1, left cheek x 1 (2) Erythematous thin papules/macules with gritty scale.   Face Dilated blood vessels  Right pretibial Firm pink/brown papulenodule with dimple sign.   Legs Varicose veins   Assessment & Plan   Purpura - Chronic; persistent and recurrent.  Treatable, but not curable. - Violaceous macules and patches - Benign - Related to trauma, age, sun damage and/or use of blood thinners, chronic use of topical and/or oral steroids - Observe - Can use OTC arnica containing moisturizer such as Dermend Bruise Formula if desired - Call for worsening or other concerns  Lentigines - Scattered tan macules - Due to sun exposure - Benign-appearing, observe - Recommend daily broad spectrum sunscreen SPF 30+ to sun-exposed areas, reapply every 2 hours as needed. - Call for any changes  Seborrheic Keratoses -  Stuck-on, waxy, tan-brown papules and/or plaques  - Benign-appearing - Discussed benign etiology and prognosis. - Observe - Call for any changes  Melanocytic Nevi - Tan-brown and/or pink-flesh-colored symmetric macules and papules - Benign appearing on exam today - Observation - Call clinic for new or changing moles - Recommend daily use of broad spectrum spf 30+ sunscreen to sun-exposed areas.   Hemangiomas - Red papules - Discussed benign nature - Observe - Call for any changes  Actinic Damage - Chronic condition, secondary to cumulative UV/sun exposure - diffuse scaly erythematous macules with underlying dyspigmentation - Recommend daily broad spectrum sunscreen SPF 30+ to sun-exposed areas, reapply every 2 hours as needed.  - Staying in the shade or wearing long sleeves, sun glasses (UVA+UVB protection) and wide brim hats (4-inch brim around the entire circumference of the hat) are also recommended for sun protection.  - Call for new or changing lesions.  Skin cancer screening performed today.  AK (actinic keratosis) Scalp x 1, left cheek x 1 Destruction of lesion - Scalp x 1, left cheek x 1 Complexity: simple   Destruction method: cryotherapy   Informed consent: discussed and consent obtained   Timeout:  patient name, date of birth, surgical site, and procedure verified Lesion destroyed using liquid nitrogen: Yes   Region frozen until ice ball extended beyond lesion: Yes   Outcome: patient tolerated procedure well with no complications   Post-procedure details: wound care instructions given    Rosacea Face Rosacea is a chronic progressive skin condition usually affecting the face of adults, causing redness and/or  acne bumps. It is treatable but not curable. It sometimes affects the eyes (ocular rosacea) as well. It may respond to topical and/or systemic medication and can flare with stress, sun exposure, alcohol, exercise and some foods.  Daily application of broad  spectrum spf 30+ sunscreen to face is recommended to reduce flares.  Discussed the treatment option of BBL/laser.  Typically we recommend 1-3 treatment sessions about 5-8 weeks apart for best results.  The patient's condition may require "maintenance treatments" in the future.  The fee for BBL / laser treatments is $350 per treatment session for the whole face.  A fee can be quoted for other parts of the body. Insurance typically does not pay for BBL/laser treatments and therefore the fee is an out-of-pocket cost.  Dermatofibroma Right pretibial Benign-appearing.  Observation.  Call clinic for new or changing lesions.  Recommend daily use of broad spectrum spf 30+ sunscreen to sun-exposed areas.   Asymptomatic varicose veins of both lower extremities Legs Benign-appearing.  Observation.  Call clinic for new or changing lesions.  Recommend daily use of broad spectrum spf 30+ sunscreen to sun-exposed areas.   Return for BBL to face then 1 year TBSE.  I, Ashok Cordia, CMA, am acting as scribe for Sarina Ser, MD . Documentation: I have reviewed the above documentation for accuracy and completeness, and I agree with the above.  Sarina Ser, MD

## 2022-07-15 ENCOUNTER — Encounter: Payer: Self-pay | Admitting: Dermatology

## 2022-08-13 ENCOUNTER — Telehealth: Payer: Self-pay | Admitting: Student in an Organized Health Care Education/Training Program

## 2022-08-13 NOTE — Telephone Encounter (Signed)
For insurance purposes he will need documentation in order for it to be approved and paid for. So...he needs to keep the VV.

## 2022-08-13 NOTE — Telephone Encounter (Signed)
Patient had a procedure in July. He wants to come in for another one. I put him on for a phone visit 08-21-22. Patient does not want to have a phone visit he just wants Dr. Holley Raring to put in orders for him to have his procedure and not have to have eval. Please ask Dr. Holley Raring if he will do this.

## 2022-08-14 ENCOUNTER — Encounter: Payer: Self-pay | Admitting: Student in an Organized Health Care Education/Training Program

## 2022-08-14 ENCOUNTER — Ambulatory Visit
Payer: PPO | Attending: Student in an Organized Health Care Education/Training Program | Admitting: Student in an Organized Health Care Education/Training Program

## 2022-08-14 DIAGNOSIS — M1712 Unilateral primary osteoarthritis, left knee: Secondary | ICD-10-CM

## 2022-08-14 DIAGNOSIS — G894 Chronic pain syndrome: Secondary | ICD-10-CM | POA: Diagnosis not present

## 2022-08-14 DIAGNOSIS — M25572 Pain in left ankle and joints of left foot: Secondary | ICD-10-CM

## 2022-08-14 DIAGNOSIS — M19072 Primary osteoarthritis, left ankle and foot: Secondary | ICD-10-CM | POA: Diagnosis not present

## 2022-08-14 NOTE — Progress Notes (Signed)
Patient: Ricardo Winters.  Service Category: E/M  Provider: Gillis Santa, MD  DOB: January 10, 1946  DOS: 08/14/2022  Location: Office  MRN: [id number]  Setting: Ambulatory outpatient  Referring Provider: No ref. provider found  Type: Established Patient  Specialty: Interventional Pain Management  PCP: System, Provider Not In  Location: Remote location  Delivery: TeleHealth     Virtual Encounter - Pain Management PROVIDER NOTE: Information contained herein reflects review and annotations entered in association with encounter. Interpretation of such information and data should be left to medically-trained personnel. Information provided to patient can be located elsewhere in the medical record under "Patient Instructions". Document created using STT-dictation technology, any transcriptional errors that may result from process are unintentional.    Contact & Pharmacy Preferred: [phone] Home: [phone] (home) Mobile: [phone] (mobile) E-mail: [email or web address]  [place], [address] [place] [address] Phone: [phone] Fax: [phone]   Pre-screening  Ricardo Winters offered "in-person" vs "virtual" encounter. He indicated preferring virtual for this encounter.   Reason COVID-19*  Social distancing based on CDC and AMA recommendations.   I contacted Ricardo Winters. on 08/14/2022 via telephone.      I clearly identified myself as Gillis Santa, MD. I verified that I was speaking with the correct person using two identifiers (Name: Ricardo Herendeen., and date of birth: 21-Dec-1945).  Consent I sought verbal advanced consent from Ricardo Winters. for virtual visit interactions. I informed Ricardo Winters of possible security and privacy concerns, risks, and limitations associated with providing "not-in-person" medical evaluation and management services. I also informed Ricardo Winters of the availability of "in-person" appointments.  Finally, I informed him that there would be a charge for the virtual visit and that he could be  personally, fully or partially, financially responsible for it. Ricardo Winters expressed understanding and agreed to proceed.   Historic Elements   Ricardo Winters. is a 76 y.o. year old, male patient evaluated today after our last contact on 08/13/2022. Ricardo Winters  has a past medical history of Anginal pain ([place]), Cancer [facility]), Coronary artery disease, COVID-19, Edema, Hypertension, Myocardial infarction ([place]), Obesity, and Varicose veins of both lower extremities. He also  has a past surgical history that includes Shoulder acromioplasty; Colonoscopy with propofol (N/A, 12/12/2015); Total hip arthroplasty (Bilateral); Knee surgery; Cardiac catheterization (2015); and Cataract extraction w/PHACO (Right, 11/30/2020). Ricardo Winters has a current medication list which includes the following prescription(s): vitamin c, aspirin ec, black pepper-turmeric, vitamin d3, collagen, ezetimibe, ferrous gluconate, grape seed, losartan, nitroglycerin, omega-3, quercetin, sildenafil, cyanocobalamin, and zinc sulfate. He  reports that he has never smoked. He has never used smokeless tobacco. He reports current alcohol use of about 4.0 standard drinks of alcohol per week. He reports that he does not use drugs. Ricardo Winters has No Known Allergies.   HPI  Today, he is being contacted for worsening of previously known (established) problem and PPE  Patient is complaining of increased left knee pain related to severe knee osteoarthritis.  He is an avid golfer and is hoping to have a repeat left genicular nerve block performed.  His previous one was done 09/14/2020 and provided him with almost 11 months of significant pain relief and improvement in his walking ability.  Given return of his pain, we discussed repeating genicular nerve block.  Risks and benefits reviewed and patient like to proceed.  Of note, patient got over 90% pain  relief  after his left dorsal tarsometatarsal joint injection.  He states that his left ankle is doing much better and is grateful for the results.   Post-procedure evaluation    Procedure:          Anesthesia, Analgesia, Anxiolysis:  Type: Diagnostic True Ankle Steroid Injection 1st TMT #5 under ultrasound guidance Region: Dorsal Tarsometatarsal (TMT) joint       LEFT  Type: Local Anesthesia Local Anesthetic: Lidocaine 1-2% Sedation: None  Indication(s):  Analgesia Route: Infiltration ([facility]/IM) IV Access: N/A   Position: Supine   1. Arthritis of ankle or foot, degenerative, left   2. Pain of joint of left ankle and foot    NAS-11 Pain score:   Pre-procedure: 5 /10   Post-procedure: 5 /10      Effectiveness:  Initial hour after procedure: 100 %  Subsequent 4-6 hours post-procedure: 100 %  Analgesia past initial 6 hours: 95 % (ongoing)  Ongoing improvement:  Analgesic:  95% Function: Ricardo Winters reports improvement in function ROM: Ricardo Winters reports improvement in ROM   Laboratory Chemistry Profile   Renal Lab Results  Component Value Date   BUN 24 (H) 06/06/2022   CREATININE 1.26 (H) 06/06/2022   GFRAA >60 02/21/2020   GFRNONAA 60 (L) 06/06/2022    Hepatic Lab Results  Component Value Date   AST 18 03/10/2018   ALT 16 (L) 03/10/2018   ALBUMIN 3.2 (L) 03/10/2018   ALKPHOS 57 03/10/2018    Electrolytes Lab Results  Component Value Date   NA 140 06/06/2022   K 3.9 06/06/2022   CL 109 06/06/2022   CALCIUM 9.1 06/06/2022    Bone No results found for: "VD25OH", "VD125OH2TOT", "QB1694HW3", "UU8280KL4", "25OHVITD1", "25OHVITD2", "25OHVITD3", "TESTOFREE", "TESTOSTERONE"  Inflammation (CRP: Acute Phase) (ESR: Chronic Phase) No results found for: "CRP", "ESRSEDRATE", "LATICACIDVEN"       Note: Above Lab results reviewed.  Imaging  CT Angio Chest PE W and/or Wo Contrast CLINICAL DATA:  chest pain  EXAM: CT ANGIOGRAPHY CHEST WITH  CONTRAST  TECHNIQUE: Multidetector CT imaging of the chest was performed using the standard protocol during bolus administration of intravenous contrast. Multiplanar CT image reconstructions and MIPs were obtained to evaluate the vascular anatomy.  RADIATION DOSE REDUCTION: This exam was performed according to the departmental dose-optimization program which includes automated exposure control, adjustment of the mA and/or kV according to patient size and/or use of iterative reconstruction technique.  CONTRAST:  46mL OMNIPAQUE IOHEXOL 350 MG/ML SOLN  COMPARISON:  Radiograph earlier today  FINDINGS: Cardiovascular: There are no filling defects within the pulmonary arteries to suggest pulmonary embolus. Mild calcified noncalcified atheromatous atherosclerosis of the thoracic aorta. No dissection or acute aortic findings. There are coronary artery calcifications. Heart is normal in size. No pericardial effusion.  Mediastinum/Nodes: No enlarged mediastinal or hilar lymph nodes. Decompressed esophagus. No thyroid nodule.  Lungs/Pleura: Peri diaphragmatic atelectasis in the left lower lobe. Additional linear bandlike atelectasis in the left greater than right lower lobe. No confluent airspace disease or evidence of pneumonia. No pleural effusion. No features of pulmonary edema. Unremarkable appearance of the trachea and central bronchi.  Upper Abdomen: Layering stones or sludge in the gallbladder without CT findings of acute cholecystitis. Colonic diverticulosis. No acute upper abdominal findings.  Musculoskeletal: Diffuse thoracic spondylosis with spurring. There are remote right posterior rib fractures. No acute osseous findings. No chest wall soft tissue abnormalities.  Review of the MIP images confirms the above findings.  IMPRESSION: 1. No pulmonary embolus. Left lung base atelectasis,  no other acute intrathoracic abnormality. 2. Aortic atherosclerosis and coronary artery  calcifications. 3. Incidental findings in the upper abdomen of gallstones or sludge in the gallbladder without CT findings of acute cholecystitis. Colonic diverticulosis without diverticulitis.  Aortic Atherosclerosis (ICD10-I70.0).  Electronically Signed   By: Keith Rake M.D.   On: 06/06/2022 23:09 DG Chest 2 View CLINICAL DATA:  Chest pain since 6 a.m.  EXAM: CHEST - 2 VIEW  COMPARISON:  02/21/2020  FINDINGS: The heart size and mediastinal contours are within normal limits. Both lungs are clear. The visualized skeletal structures are unremarkable.  IMPRESSION: No active cardiopulmonary disease.  Electronically Signed   By: Randa Ngo M.D.   On: 06/06/2022 17:12  Assessment  The primary encounter diagnosis was Primary osteoarthritis of left knee. Diagnoses of Arthritis of ankle or foot, degenerative, left, Pain of joint of left ankle and foot, and Chronic pain syndrome were also pertinent to this visit.  Plan of Care   1. Primary osteoarthritis of left knee -previous one was done 09/14/2020 and provided him with almost 11 months of significant pain relief and improvement in his walking ability.  Given return of his pain, we discussed repeating genicular nerve block.  Risks and benefits reviewed and patient like to proceed. - GENICULAR NERVE BLOCK; Future  2. Arthritis of ankle or foot, degenerative, left -Status post left dorsal tarsometatarsal joint injection under ultrasound guidance on 05/28/2022, greater than 90% pain relief for his left ankle.  Repeat as needed.  3. Pain of joint of left ankle and foot -Status post left dorsal tarsometatarsal joint injection under ultrasound guidance on 05/28/2022, greater than 90% pain relief for his left ankle.  Repeat as needed.  4. Chronic pain syndrome -previous one was done 09/14/2020 and provided him with almost 11 months of significant pain relief and improvement in his walking ability.  Given return of his pain, we  discussed repeating genicular nerve block.  Risks and benefits reviewed and patient like to proceed. - GENICULAR NERVE BLOCK; Future    Orders:  Orders Placed This Encounter  Procedures   GENICULAR NERVE BLOCK    Indication(s):  Sub-acute knee pain    Standing Status:   Future    Standing Expiration Date:   11/14/2022    Scheduling Instructions:     Side: LEFT     Sedation: without     Timeframe: As soon as schedule allows    Order Specific Question:   Where will this procedure be performed?    Answer:   [facility]   Follow-up plan:   Return in about 2 weeks (around 08/28/2022) for Left GNB , in clinic [facility].     Status post left L5-S1 ESI #1 on 05/06/2019, and #2 on 06/17/2019 as well as left sacroiliac joint injection #1 on 06/17/2019: Right SI joint injection, right piriformis injection on 04/20/2020-helped significantly; LEFT SI joint injection, LEFT piriformis injection on 05/30/20 helped significantly, repeat as needed.  08/08/2020: Diagnostic bilateral lumbar facet medial branch nerve blocks at L3, L4, L5.  Bilateral sacroiliac joint injection        Recent Visits Date Type Provider Dept  05/28/22 Procedure visit Gillis Santa, MD [facility]  05/23/22 Office Visit Gillis Santa, MD [facility]  Showing recent visits within past 90 days and meeting all other requirements Today's Visits Date Type Provider Dept  08/14/22 Office Visit Gillis Santa, MD [facility]  Showing today's visits and meeting all other requirements Future Appointments No  visits were found meeting these conditions. Showing future appointments within next 90 days and meeting all other requirements  I discussed the assessment and treatment plan with the patient. The patient was provided an opportunity to ask questions and all were answered. The patient agreed with the plan and demonstrated an understanding of the instructions.  Patient advised to call back or seek an  in-person evaluation if the symptoms or condition worsens.  Duration of encounter: 9mnutes.  Note by: BGillis Santa MD Date: 08/14/2022; Time: 2:32 PM

## 2022-08-15 ENCOUNTER — Telehealth: Payer: PPO | Admitting: Student in an Organized Health Care Education/Training Program

## 2022-08-20 ENCOUNTER — Ambulatory Visit
Admission: RE | Admit: 2022-08-20 | Discharge: 2022-08-20 | Disposition: A | Discharge status: Home / Self Care | Payer: PPO | Source: Ambulatory Visit | Attending: Student in an Organized Health Care Education/Training Program | Admitting: Student in an Organized Health Care Education/Training Program

## 2022-08-20 ENCOUNTER — Encounter: Payer: Self-pay | Admitting: Student in an Organized Health Care Education/Training Program

## 2022-08-20 ENCOUNTER — Ambulatory Visit
Payer: PPO | Attending: Student in an Organized Health Care Education/Training Program | Admitting: Student in an Organized Health Care Education/Training Program

## 2022-08-20 VITALS — BP 189/86 | HR 53 | Temp 97.0°F | Resp 17 | Ht 72.0 in | Wt 232.0 lb

## 2022-08-20 DIAGNOSIS — G894 Chronic pain syndrome: Secondary | ICD-10-CM | POA: Diagnosis not present

## 2022-08-20 DIAGNOSIS — M19072 Primary osteoarthritis, left ankle and foot: Secondary | ICD-10-CM

## 2022-08-20 DIAGNOSIS — M1712 Unilateral primary osteoarthritis, left knee: Secondary | ICD-10-CM

## 2022-08-20 MED ORDER — DEXAMETHASONE SODIUM PHOSPHATE 10 MG/ML IJ SOLN
10.0000 mg | Freq: Once | INTRAMUSCULAR | Status: AC
  Administered 2022-08-20: 10 mg
  Filled 2022-08-20: qty 1

## 2022-08-20 MED ORDER — ROPIVACAINE HCL 2 MG/ML IJ SOLN
9.0000 mL | Freq: Once | INTRAMUSCULAR | Status: AC
  Administered 2022-08-20: 20 mL via PERINEURAL
  Filled 2022-08-20: qty 20

## 2022-08-20 MED ORDER — LIDOCAINE HCL 2 % IJ SOLN
20.0000 mL | Freq: Once | INTRAMUSCULAR | Status: AC
  Administered 2022-08-20: 400 mg
  Filled 2022-08-20: qty 20

## 2022-08-20 NOTE — Patient Instructions (Addendum)
Voltaren gel Capsaicin cream ____________________________________________________________________________________________  Post-procedure Information What to expect: Most procedures involve the use of a local anesthetic (numbing medicine), and a steroid (anti-inflammatory medicine).  The local anesthetics may cause temporary numbness and weakness of the legs or arms, depending on the location of the block. This numbness/weakness may last 4-6 hours, depending on the local anesthetic used. In rare instances, it can last up to 24 hours. While numb, you must be very careful not to injure the extremity.  After any procedure, you could expect the pain to get better within 15-20 minutes. This relief is temporary and may last 4-6 hours. Once the local anesthetics wears off, you could experience discomfort, possibly more than usual, for up to 10 (ten) days. In the case of radiofrequencies, it may last up to 6 weeks. Surgeries may take up to 8 weeks for the healing process. The discomfort is due to the irritation caused by needles going through skin and muscle. To minimize the discomfort, we recommend using ice the first day, and heat from then on. The ice should be applied for 15 minutes on, and 15 minutes off. Keep repeating this cycle until bedtime. Avoid applying the ice directly to the skin, to prevent frostbite. Heat should be used daily, until the pain improves (4-10 days). Be careful not to burn yourself.  Occasionally you may experience muscle spasms or cramps. These occur as a consequence of the irritation caused by the needle sticks to the muscle and the blood that will inevitably be lost into the surrounding muscle tissue. Blood tends to be very irritating to tissues, which tend to react by going into spasm. These spasms may start the same day of your procedure, but they may also take days to develop. This late onset type of spasm or cramp is usually caused by electrolyte imbalances triggered by the  steroids, at the level of the kidney. Cramps and spasms tend to respond well to muscle relaxants, multivitamins (some are triggered by the procedure, but may have their origins in vitamin deficiencies), and "Gatorade", or any sports drinks that can replenish any electrolyte imbalances. (If you are a diabetic, ask your pharmacist to get you a sugar-free brand.) Warm showers or baths may also be helpful. Stretching exercises are highly recommended.  General Instructions:  Be alert for signs of possible infection: redness, swelling, heat, red streaks, elevated temperature, and/or fever. These typically appear 4 to 6 days after the procedure. Immediately notify your doctor if you experience unusual bleeding, difficulty breathing, or loss of bowel or bladder control. If you experience increased pain, do not increase your pain medicine intake, unless instructed by your pain physician.  Post-Procedure Care:  Be careful in moving about. Muscle spasms in the area of the injection may occur. Applying ice or heat to the area is often helpful. The incidence of spinal headaches after epidural injections ranges between 1.4% and 6%. If you develop a headache that does not seem to respond to conservative therapy, please let your physician know. This can be treated with an epidural blood patch.   Post-procedure numbness or redness is to be expected, however it should average 4 to 6 hours. If numbness and weakness of your extremities begins to develop 4 to 6 hours after your procedure, and is felt to be progressing and worsening, immediately contact your physician.  Diet:  If you experience nausea, do not eat until this sensation goes away. If you had a "Stellate Ganglion Block" for upper extremity "Reflex Sympathetic Dystrophy",  do not eat or drink until your hoarseness goes away. In any case, always start with liquids first and if you tolerate them well, then slowly progress to more solid foods.  Activity:  For the  first 4 to 6 hours after the procedure, use caution in moving about as you may experience numbness and/or weakness. Use caution in cooking, using household electrical appliances, and climbing steps. If you need to reach your Doctor call our office: [phone] (During business hours) or [phone] (After business hours).  Business Hours: Monday-Thursday 8:00 am - 4:00 PM    Fridays: Closed     In case of an emergency: In case of emergency, call 911 or go to the nearest emergency room and have the physician there call us.  Interpretation of Procedure Every nerve block has two components: a diagnostic component, and a treatment component. Unrealistic expectations are the most common causes of "perceived failure".  In a perfect world, a single nerve block should be able to completely and permanently eliminate the pain. Sadly, the world is not perfect.  Most pain management nerve blocks are performed using local anesthetics and steroids. Steroids are responsible for any long-term benefit that you may experience. Their purpose is to decrease any chronic swelling that may exist in the area. Steroids begin to work immediately after being injected. However, most patients will not experience any benefits until 5 to 10 days after the injection, when the swelling has come down to the point where they can tell a difference. Steroids will only help if there is swelling to be treated. As such, they can assist with the diagnosis. If effective, they suggest an inflammatory component to the pain, and if ineffective, they rule out inflammation as the main cause or component of the problem. If the problem is one of mechanical compression, you will get no benefit from those steroids.   In the case of local anesthetics, they have a crucial role in the diagnosis of your condition. Most will begin to work within15 to 20 minutes after injection. The duration will depend on the type used (short- vs. Long-acting). It is  of outmost importance that patients keep tract of their pain, after the procedure. To assist with this matter, a "Post-procedure Pain Diary" is provided. Make sure to complete it and to bring it back to your follow-up appointment.  As long as the patient keeps accurate, detailed records of their symptoms after every procedure, and returns to have those interpreted, every procedure will provide [place] with invaluable information. Even a block that does not provide the patient with any relief, will always provide [place] with information about the mechanism and the origin of the pain. The only time a nerve block can be considered a waste of time is when patients do not keep track of the results, or do not keep their post-procedure appointment.  Reporting the results back to your physician The Pain Score  Pain is a subjective complaint. It cannot be seen, touched, or measured. We depend entirely on the patient's report of the pain in order to assess your condition and treatment. To evaluate the pain, we use a pain scale, where "0" means "No Pain", and a "10" is "the worst possible pain that you can even imagine" (i.e. something like been eaten alive by a shark or being torn apart by a lion).   Use the Pain Scale provided. You will frequently be asked to rate your pain. Please be accurate, remember that medical decisions will  be based on your responses. Please do not rate your pain above a 10. Doing so is actually interpreted as "symptom magnification" (exaggeration). To put this into perspective, when you tell us that your pain is at a 10 (ten), what you are saying is that there is nothing we can do to make this pain any worse. (Carefully think about that.) ____________________________________________________________________________________________

## 2022-08-20 NOTE — Progress Notes (Signed)
Patient's Name: Ricardo Winters.  MRN: [id number]  Referring Provider: No ref. provider found  DOB: Dec 14, 1945  PCP: System, Provider Not In  DOS: 08/20/2022  Note by: Gillis Santa, MD  Service setting: Ambulatory outpatient  Specialty: Interventional Pain Management  Patient type: Established  Location: [facility] (AMB) Pain Management Facility  Visit type: Interventional Procedure   Primary Reason for Visit: Interventional Pain Management Treatment. CC: Knee Pain (Left knee pain)  Procedure:          Anesthesia, Analgesia, Anxiolysis:  Type: Genicular Nerves Block (Superior-lateral, Superior-medial, and Inferior-medial Genicular Nerves) #3 (previously done 09/14/20  CPT: 40981      Primary Purpose: Therapeutic Region: Lateral, Anterior, and Medial aspects of the knee joint, above and below the knee joint proper. Level: Superior and inferior to the knee joint. Target Area: For Genicular Nerve block(s), the targets are: the superior-lateral genicular nerve, located in the lateral distal portion of the femoral shaft as it curves to form the lateral epicondyle, in the region of the distal femoral metaphysis; the superior-medial genicular nerve, located in the medial distal portion of the femoral shaft as it curves to form the medial epicondyle; and the inferior-medial genicular nerve, located in the medial, proximal portion of the tibial shaft, as it curves to form the medial epicondyle, in the region of the proximal tibial metaphysis. Approach: Anterior, percutaneous, ipsilateral approach. Laterality: Left knee Position: Modified Fowler's position with pillows under the targeted knee(s).  Type: Local Anesthesia Indication(s): Analgesia         Route: Infiltration ([place]/IM) IV Access: Declined Sedation: Declined  Local Anesthetic: Lidocaine 1-2%   Indications: 1. Primary osteoarthritis of left knee   2. Arthritis of ankle or foot, degenerative, left   3. Chronic pain syndrome    Pain  Score: Pre-procedure: 4 /10 Post-procedure: 0-No pain/10  Pre-op Assessment:  Ricardo Winters is a 76 y.o. (year old), male patient, seen today for interventional treatment. He  has a past surgical history that includes Shoulder acromioplasty; Colonoscopy with propofol (N/A, 12/12/2015); Total hip arthroplasty (Bilateral); Knee surgery; Cardiac catheterization (2015); and Cataract extraction w/PHACO (Right, 11/30/2020). Ricardo Winters has a current medication list which includes the following prescription(s): vitamin c, aspirin ec, black pepper-turmeric, vitamin d3, collagen, grape seed, losartan, nitroglycerin, omega-3, quercetin, sildenafil, cyanocobalamin, zinc sulfate, ezetimibe, and ferrous gluconate. His primarily concern today is the Knee Pain (Left knee pain)  Initial Vital Signs:  Pulse/HCG Rate: (!) 53ECG Heart Rate: (!) 49 Temp: (!) 97 F (36.1 C) Resp: 13 BP: (!) 182/92 SpO2: 99 %  BMI: Estimated body mass index is 31.46 kg/m as calculated from the following:   Height as of this encounter: 6' (1.829 m).   Weight as of this encounter: 232 lb (105.2 kg).  Risk Assessment: Allergies: Reviewed. He has No Known Allergies.  Allergy Precautions: None required Coagulopathies: Reviewed. None identified.  Blood-thinner therapy: None at this time Active Infection(s): Reviewed. None identified. Ricardo Winters is afebrile  Site Confirmation: Ricardo Winters was asked to confirm the procedure and laterality before marking the site Procedure checklist: Completed Consent: Before the procedure and under the influence of no sedative(s), amnesic(s), or anxiolytics, the patient was informed of the treatment options, risks and possible complications. To fulfill our ethical and legal obligations, as recommended by the American Medical Association's Code of Ethics, I have informed the patient of my clinical impression; the nature and purpose of the treatment or procedure; the risks, benefits, and possible complications  of the intervention; the  alternatives, including doing nothing; the risk(s) and benefit(s) of the alternative treatment(s) or procedure(s); and the risk(s) and benefit(s) of doing nothing. The patient was provided information about the general risks and possible complications associated with the procedure. These may include, but are not limited to: failure to achieve desired goals, infection, bleeding, organ or nerve damage, allergic reactions, paralysis, and death. In addition, the patient was informed of those risks and complications associated to the procedure, such as failure to decrease pain; infection; bleeding; organ or nerve damage with subsequent damage to sensory, motor, and/or autonomic systems, resulting in permanent pain, numbness, and/or weakness of one or several areas of the body; allergic reactions; (i.e.: anaphylactic reaction); and/or death. Furthermore, the patient was informed of those risks and complications associated with the medications. These include, but are not limited to: allergic reactions (i.e.: anaphylactic or anaphylactoid reaction(s)); adrenal axis suppression; blood sugar elevation that in diabetics may result in ketoacidosis or comma; water retention that in patients with history of congestive heart failure may result in shortness of breath, pulmonary edema, and decompensation with resultant heart failure; weight gain; swelling or edema; medication-induced neural toxicity; particulate matter embolism and blood vessel occlusion with resultant organ, and/or nervous system infarction; and/or aseptic necrosis of one or more joints. Finally, the patient was informed that Medicine is not an exact science; therefore, there is also the possibility of unforeseen or unpredictable risks and/or possible complications that may result in a catastrophic outcome. The patient indicated having understood very clearly. We have given the patient no guarantees and we have made no promises. Enough  time was given to the patient to ask questions, all of which were answered to the patient's satisfaction. Ricardo Winters has indicated that he wanted to continue with the procedure. Attestation: I, the ordering provider, attest that I have discussed with the patient the benefits, risks, side-effects, alternatives, likelihood of achieving goals, and potential problems during recovery for the procedure that I have provided informed consent. Date  Time: 08/20/2022  8:54 AM  Pre-Procedure Preparation:  Monitoring: As per clinic protocol. Respiration, ETCO2, SpO2, BP, heart rate and rhythm monitor placed and checked for adequate function Safety Precautions: Patient was assessed for positional comfort and pressure points before starting the procedure. Time-out: I initiated and conducted the "Time-out" before starting the procedure, as per protocol. The patient was asked to participate by confirming the accuracy of the "Time Out" information. Verification of the correct person, site, and procedure were performed and confirmed by me, the nursing staff, and the patient. "Time-out" conducted as per Joint Commission's Universal Protocol (UP.01.01.01). Time: 0928  Description of Procedure:          Area Prepped: Entire knee area, from mid-thigh to mid-shin, lateral, anterior, and medial aspects. Prepping solution: ChloraPrep (2% chlorhexidine gluconate and 70% isopropyl alcohol) Safety Precautions: Aspiration looking for blood return was conducted prior to all injections. At no point did we inject any substances, as a needle was being advanced. No attempts were made at seeking any paresthesias. Safe injection practices and needle disposal techniques used. Medications properly checked for expiration dates. SDV (single dose vial) medications used. Latex Allergy precautions taken.   Description of the Procedure: Protocol guidelines were followed. The patient was placed in position over the procedure table. The target  area was identified and the area prepped in the usual manner. Skin & deeper tissues infiltrated with local anesthetic. Appropriate amount of time allowed to pass for local anesthetics to take effect. The procedure needles were  then advanced to the target area. Proper needle placement secured. Negative aspiration confirmed. Solution injected in intermittent fashion, asking for systemic symptoms every 0.5cc of injectate. The needles were then removed and the area cleansed, making sure to leave some of the prepping solution back to take advantage of its long term bactericidal properties.  Vitals:   08/20/22 0857 08/20/22 0931 08/20/22 0933  BP: (!) 182/92 (!) 157/82 (!) 189/86  Pulse: (!) 53    Resp:  13 17  Temp: (!) 97 F (36.1 C)    SpO2: 99% 98% 99%  Weight: 232 lb (105.2 kg)    Height: 6' (1.829 m)      Start Time: 0928 hrs. End Time: 0933 hrs. Materials:  Needle(s) Type: Regular needle Gauge: 22G Length: 3.5-in Medication(s): Please see orders for medications and dosing details. 6 cc solution containing 5 cc of 0.2% ropivacaine, 1 cc of Decadron 10 mg/cc.  2 cc injected at each genicular nerve above. Imaging Guidance (Non-Spinal):          Type of Imaging Technique: Fluoroscopy Guidance (Non-Spinal) Indication(s): Assistance in needle guidance and placement for procedures requiring needle placement in or near specific anatomical locations not easily accessible without such assistance. Exposure Time: Please see nurses notes. Contrast: Before injecting any contrast, we confirmed that the patient did not have an allergy to iodine, shellfish, or radiological contrast. Once satisfactory needle placement was completed at the desired level, radiological contrast was injected. Contrast injected under live fluoroscopy. No contrast complications. See chart for type and volume of contrast used. Fluoroscopic Guidance: I was personally present during the use of fluoroscopy. "Tunnel Vision Technique"  used to obtain the best possible view of the target area. Parallax error corrected before commencing the procedure. "Direction-depth-direction" technique used to introduce the needle under continuous pulsed fluoroscopy. Once target was reached, antero-posterior, oblique, and lateral fluoroscopic projection used confirm needle placement in all planes. Images permanently stored in EMR. Interpretation: I personally interpreted the imaging intraoperatively. Adequate needle placement confirmed in multiple planes. Appropriate spread of contrast into desired area was observed. No evidence of afferent or efferent intravascular uptake. Permanent images saved into the patient's record.  Antibiotic Prophylaxis:   Anti-infectives (From admission, onward)    None      Indication(s): None identified  Post-operative Assessment:  Post-procedure Vital Signs:  Pulse/HCG Rate: (!) 53(!) 53 Temp: (!) 97 F (36.1 C) Resp: 17 BP: (!) 189/86 SpO2: 99 %  EBL: None  Complications: No immediate post-treatment complications observed by team, or reported by patient.  Note: The patient tolerated the entire procedure well. A repeat set of vitals were taken after the procedure and the patient was kept under observation following institutional policy, for this type of procedure. Post-procedural neurological assessment was performed, showing return to baseline, prior to discharge. The patient was provided with post-procedure discharge instructions, including a section on how to identify potential problems. Should any problems arise concerning this procedure, the patient was given instructions to immediately contact us, at any time, without hesitation. In any case, we plan to contact the patient by telephone for a follow-up status report regarding this interventional procedure.  Comments:  No additional relevant information.  Plan of Care   Imaging Orders         DG PAIN CLINIC C-ARM 1-60 MIN NO REPORT    Procedure  Orders    No procedure(s) ordered today    Medications ordered for procedure: Meds ordered this encounter  Medications   lidocaine (XYLOCAINE)  2 % (with pres) injection 400 mg   dexamethasone (DECADRON) injection 10 mg   ropivacaine (PF) 2 mg/mL (0.2%) (NAROPIN) injection 9 mL   Medications administered: We administered lidocaine, dexamethasone, and ropivacaine (PF) 2 mg/mL (0.2%).  See the medical record for exact dosing, route, and time of administration.  New Prescriptions   No medications on file   Disposition: Discharge home  Discharge Date & Time: 08/20/2022; 0940 hrs.   Physician-requested Follow-up: Return in about 4 weeks (around 09/17/2022) for Post Procedure Evaluation, virtual.  Future Appointments  Date Time Provider [place]  09/17/2022  2:40 PM Gillis Santa, MD [facility] None  11/29/2022  1:30 PM Ralene Bathe, MD [facility] None  01/03/2023  1:30 PM Ralene Bathe, MD [facility] None  01/31/2023  1:30 PM Ralene Bathe, MD [facility] None  07/31/2023  9:45 AM Ralene Bathe, MD [facility] None   Primary Care Physician: System, Provider Not In Location: [facility] Note by: Gillis Santa, MD Date: 08/20/2022; Time: 10:54 AM  Disclaimer:  Medicine is not an exact science. The only guarantee in medicine is that nothing is guaranteed. It is important to note that the decision to proceed with this intervention was based on the information collected from the patient. The Data and conclusions were drawn from the patient's questionnaire, the interview, and the physical examination. Because the information was provided in large part by the patient, it cannot be guaranteed that it has not been purposely or unconsciously manipulated. Every effort has been made to obtain as much relevant data as possible for this evaluation. It is important to note that the conclusions that lead to this procedure are derived in large part from the available  data. Always take into account that the treatment will also be dependent on availability of resources and existing treatment guidelines, considered by other Pain Management Practitioners as being common knowledge and practice, at the time of the intervention. For Medico-Legal purposes, it is also important to point out that variation in procedural techniques and pharmacological choices are the acceptable norm. The indications, contraindications, technique, and results of the above procedure should only be interpreted and judged by a Board-Certified Interventional Pain Specialist with extensive familiarity and expertise in the same exact procedure and technique.

## 2022-08-20 NOTE — Progress Notes (Signed)
Safety precautions to be maintained throughout the outpatient stay will include: orient to surroundings, keep bed in low position, maintain call bell within reach at all times, provide assistance with transfer out of bed and ambulation.  

## 2022-08-21 ENCOUNTER — Telehealth: Payer: Self-pay

## 2022-08-21 ENCOUNTER — Telehealth: Payer: PPO | Admitting: Student in an Organized Health Care Education/Training Program

## 2022-08-21 NOTE — Telephone Encounter (Signed)
Post procedure phone call.  Patient states he is doing good.  

## 2022-09-17 ENCOUNTER — Ambulatory Visit
Payer: PPO | Attending: Student in an Organized Health Care Education/Training Program | Admitting: Student in an Organized Health Care Education/Training Program

## 2022-09-17 ENCOUNTER — Encounter: Payer: Self-pay | Admitting: Student in an Organized Health Care Education/Training Program

## 2022-09-17 DIAGNOSIS — M25572 Pain in left ankle and joints of left foot: Secondary | ICD-10-CM

## 2022-09-17 DIAGNOSIS — M19072 Primary osteoarthritis, left ankle and foot: Secondary | ICD-10-CM | POA: Diagnosis not present

## 2022-09-17 DIAGNOSIS — M1712 Unilateral primary osteoarthritis, left knee: Secondary | ICD-10-CM | POA: Diagnosis not present

## 2022-09-17 DIAGNOSIS — G894 Chronic pain syndrome: Secondary | ICD-10-CM | POA: Diagnosis not present

## 2022-09-17 NOTE — Progress Notes (Signed)
Patient: Ricardo Winters.  Service Category: E/M  Provider: Gillis Santa, MD  DOB: 1946-06-25  DOS: 09/17/2022  Location: Office  MRN: [id number]  Setting: Ambulatory outpatient  Referring Provider: No ref. provider found  Type: Established Patient  Specialty: Interventional Pain Management  PCP: System, Provider Not In  Location: Remote location  Delivery: TeleHealth     Virtual Encounter - Pain Management PROVIDER NOTE: Information contained herein reflects review and annotations entered in association with encounter. Interpretation of such information and data should be left to medically-trained personnel. Information provided to patient can be located elsewhere in the medical record under "Patient Instructions". Document created using STT-dictation technology, any transcriptional errors that may result from process are unintentional.    Contact & Pharmacy Preferred: [phone] Home: [phone] (home) Mobile: [phone] (mobile) E-mail: [email or web address]  [place], [address] [place] [address] Phone: [phone] Fax: [phone]   Pre-screening  Mr. Mausolf offered "in-person" vs "virtual" encounter. He indicated preferring virtual for this encounter.   Reason COVID-19*  Social distancing based on CDC and AMA recommendations.   I contacted [name]. on 09/17/2022 via telephone.      I clearly identified myself as Gillis Santa, MD. I verified that I was speaking with the correct person using two identifiers (Name: Roverto Bodmer., and date of birth: [birth year]).  Consent I sought verbal advanced consent from [name]. for virtual visit interactions. I informed Mr. Prime of possible security and privacy concerns, risks, and limitations associated with providing "not-in-person" medical evaluation and management services. I also informed Mr. Fonda of the availability of "in-person" appointments.  Finally, I informed him that there would be a charge for the virtual visit and that he could be  personally, fully or partially, financially responsible for it. Mr. Bitner expressed understanding and agreed to proceed.   Historic Elements   Mr. Davinder Haff. is a 77 y.o. year old, male patient evaluated today after our last contact on 08/20/2022. Mr. Martis  has a past medical history of Anginal pain ([place]), Cancer [facility]), Coronary artery disease, COVID-19, Edema, Hypertension, Myocardial infarction ([place]), Obesity, and Varicose veins of both lower extremities. He also  has a past surgical history that includes Shoulder acromioplasty; Colonoscopy with propofol (N/A, 12/12/2015); Total hip arthroplasty (Bilateral); Knee surgery; Cardiac catheterization (2015); and Cataract extraction w/PHACO (Right, 11/30/2020). Mr. Fugitt has a current medication list which includes the following prescription(s): vitamin c, aspirin ec, black pepper-turmeric, vitamin d3, collagen, grape seed, losartan, nitroglycerin, omega-3, quercetin, sildenafil, cyanocobalamin, zinc sulfate, ezetimibe, and ferrous gluconate. He  reports that he has never smoked. He has never used smokeless tobacco. He reports current alcohol use of about 4.0 standard drinks of alcohol per week. He reports that he does not use drugs. Mr. Mehaffey has No Known Allergies.   HPI  Today, he is being contacted for a post-procedure assessment.   Post-procedure evaluation    Procedure:          Anesthesia, Analgesia, Anxiolysis:  Type: Genicular Nerves Block (Superior-lateral, Superior-medial, and Inferior-medial Genicular Nerves) #3 (previously done 09/14/20    Type: Local Anesthesia Local Anesthetic: Lidocaine 1-2% Sedation: None  Indication(s):  Analgesia Route: Infiltration ([place]/IM) IV Access: N/A   Position: Supine   1. Indications: 1. Primary osteoarthritis of left knee   2. Arthritis of ankle or foot, degenerative, left   3. Chronic pain  syndrome     2.  NAS-11 Pain score:   Pre-procedure: 5 /10   Post-procedure: 5 /10       Effectiveness:  Initial hour after procedure: 100 %  Subsequent 4-6 hours post-procedure: 100 %  Analgesia past initial 6 hours: 100 % (first 2 - 3 days the pain was relieved.  then the weekend he was very active and feels that it aggravated the knee.)  Ongoing improvement: 45-50% Analgesic: <40%  Laboratory Chemistry Profile   Renal Lab Results  Component Value Date   BUN 24 (H) 06/06/2022   CREATININE 1.26 (H) 06/06/2022   GFRAA >60 02/21/2020   GFRNONAA 60 (L) 06/06/2022    Hepatic Lab Results  Component Value Date   AST 18 03/10/2018   ALT 16 (L) 03/10/2018   ALBUMIN 3.2 (L) 03/10/2018   ALKPHOS 57 03/10/2018    Electrolytes Lab Results  Component Value Date   NA 140 06/06/2022   K 3.9 06/06/2022   CL 109 06/06/2022   CALCIUM 9.1 06/06/2022    Bone No results found for: "VD25OH", "VD125OH2TOT", "JX9147WG9", "FA2130QM5", "25OHVITD1", "25OHVITD2", "25OHVITD3", "TESTOFREE", "TESTOSTERONE"  Inflammation (CRP: Acute Phase) (ESR: Chronic Phase) No results found for: "CRP", "ESRSEDRATE", "LATICACIDVEN"       Note: Above Lab results reviewed.  Assessment  The primary encounter diagnosis was Primary osteoarthritis of left knee. Diagnoses of Arthritis of ankle or foot, degenerative, left, Pain of joint of left ankle and foot, and Chronic pain syndrome were also pertinent to this visit.  Plan of Care  Patient states that he is continuing to have left knee pain even after his genicular nerve block.  Future considerations include left knee genicular RFA or viscosupplementation which the patient is inquiring about.  I will obtain a repeat left knee x-ray to evaluate the degree of joint space that remains which will further guide treatment.  After x-ray we can further discuss if we should pursue a left knee genicular RFA versus viscosupplementation in the left knee.  Orders:  Orders  Placed This Encounter  Procedures   DG Knee Complete 4 Views Left    Standing Status:   Future    Standing Expiration Date:   10/17/2022    Scheduling Instructions:     Imaging must be done as soon as possible. Inform patient that order will expire within 30 days and I will not renew it.    Order Specific Question:   Reason for Exam (SYMPTOM  OR DIAGNOSIS REQUIRED)    Answer:   Left knee pain/arthralgia    Order Specific Question:   Preferred imaging location?    Answer:   [facility]    Order Specific Question:   Release to patient    Answer:   Immediate    Order Specific Question:   Call Results- Best Contact Number?    Answer:   [phone] [facility])   Follow-up plan:   Return for We will contact patient after x-ray results to discuss viscosupplementation versus genicular RFA.     Status post left L5-S1 ESI #1 on 05/06/2019, and #2 on 06/17/2019 as well as left sacroiliac joint injection #1 on 06/17/2019: Right SI joint injection, right piriformis injection on 04/20/2020-helped significantly; LEFT SI joint injection, LEFT piriformis injection on 05/30/20 helped significantly, repeat as needed.  08/08/2020: Diagnostic bilateral lumbar facet medial branch nerve blocks at L3, L4, L5.  Bilateral sacroiliac joint injection         Recent Visits Date Type Provider Dept  08/20/22 Procedure visit Gillis Santa, MD [facility]  08/14/22 Office Visit Gillis Santa, MD [facility]  Showing recent visits within past 90 days and meeting all other requirements Today's Visits Date Type Provider Dept  09/17/22 Office Visit Gillis Santa, MD [facility]  Showing today's visits and meeting all other requirements Future Appointments No visits were found meeting these conditions. Showing future appointments within next 90 days and meeting all other requirements  I discussed the assessment and treatment plan with the patient. The patient was provided an  opportunity to ask questions and all were answered. The patient agreed with the plan and demonstrated an understanding of the instructions.  Patient advised to call back or seek an in-person evaluation if the symptoms or condition worsens.  Duration of encounter: 15 minutes.  Note by: Gillis Santa, MD Date: 09/17/2022; Time: 3:38 PM

## 2022-09-26 ENCOUNTER — Ambulatory Visit
Admission: RE | Admit: 2022-09-26 | Discharge: 2022-09-26 | Disposition: A | Discharge status: Home / Self Care | Payer: PPO | Source: Ambulatory Visit | Attending: Student in an Organized Health Care Education/Training Program | Admitting: Student in an Organized Health Care Education/Training Program

## 2022-09-26 DIAGNOSIS — M1712 Unilateral primary osteoarthritis, left knee: Secondary | ICD-10-CM | POA: Diagnosis not present

## 2022-09-26 DIAGNOSIS — G894 Chronic pain syndrome: Secondary | ICD-10-CM | POA: Diagnosis not present

## 2022-09-26 DIAGNOSIS — M25562 Pain in left knee: Secondary | ICD-10-CM | POA: Diagnosis not present

## 2022-10-11 ENCOUNTER — Other Ambulatory Visit: Payer: Self-pay | Admitting: Cardiovascular Disease

## 2022-10-25 ENCOUNTER — Encounter: Payer: Self-pay | Admitting: Student in an Organized Health Care Education/Training Program

## 2022-10-30 ENCOUNTER — Ambulatory Visit
Payer: PPO | Attending: Student in an Organized Health Care Education/Training Program | Admitting: Student in an Organized Health Care Education/Training Program

## 2022-10-30 ENCOUNTER — Encounter: Payer: Self-pay | Admitting: Student in an Organized Health Care Education/Training Program

## 2022-10-30 DIAGNOSIS — G894 Chronic pain syndrome: Secondary | ICD-10-CM | POA: Diagnosis not present

## 2022-10-30 DIAGNOSIS — M1712 Unilateral primary osteoarthritis, left knee: Secondary | ICD-10-CM

## 2022-10-30 NOTE — Progress Notes (Signed)
Patient: Ricardo Winters.  Service Category: E/M  Provider: Gillis Santa, MD  DOB: 08-01-1946  DOS: 10/30/2022  Location: Office  MRN: [id number]  Setting: Ambulatory outpatient  Referring Provider: No ref. provider found  Type: Established Patient  Specialty: Interventional Pain Management  PCP: System, Provider Not In  Location: Remote location  Delivery: TeleHealth     Virtual Encounter - Pain Management PROVIDER NOTE: Information contained herein reflects review and annotations entered in association with encounter. Interpretation of such information and data should be left to medically-trained personnel. Information provided to patient can be located elsewhere in the medical record under "Patient Instructions". Document created using STT-dictation technology, any transcriptional errors that may result from process are unintentional.    Contact & Pharmacy Preferred: [phone] Home: [phone] (home) Mobile: [phone] (mobile) E-mail: [email or web address]  [place], [place] [place] [address] Phone: [phone] Fax: [phone]   Pre-screening  Mr. Leccese offered "in-person" vs "virtual" encounter. He indicated preferring virtual for this encounter.   Reason COVID-19*  Social distancing based on CDC and AMA recommendations.   I contacted [name]. on 10/30/2022 via telephone.      I clearly identified myself as Gillis Santa, MD. I verified that I was speaking with the correct person using two identifiers (Name: Akashdeep Chuba., and date of birth: 11-Feb-1946).  Consent I sought verbal advanced consent from [name]. for virtual visit interactions. I informed Mr. Summer of possible security and privacy concerns, risks, and limitations associated with providing "not-in-person" medical evaluation and management services. I also informed Mr. Pardo of the availability of "in-person" appointments. Finally, I  informed him that there would be a charge for the virtual visit and that he could be  personally, fully or partially, financially responsible for it. Mr. Hyneman expressed understanding and agreed to proceed.   Historic Elements   Mr. Jamarrion Budai. is a 77 y.o. year old, male patient evaluated today after our last contact on 09/17/2022. Mr. Jefferys  has a past medical history of Anginal pain ([place]), Cancer [facility]), Coronary artery disease, COVID-19, Edema, Hypertension, Myocardial infarction ([place]), Obesity, and Varicose veins of both lower extremities. He also  has a past surgical history that includes Shoulder acromioplasty; Colonoscopy with propofol (N/A, 12/12/2015); Total hip arthroplasty (Bilateral); Knee surgery; Cardiac catheterization (2015); and Cataract extraction w/PHACO (Right, 11/30/2020). Mr. Brightwell has a current medication list which includes the following prescription(s): vitamin c, aspirin ec, black pepper-turmeric, vitamin d3, collagen, grape seed, losartan, nitroglycerin, omega-3, quercetin, sildenafil, cyanocobalamin, zinc sulfate, ezetimibe, and ferrous gluconate. He  reports that he has never smoked. He has never used smokeless tobacco. He reports current alcohol use of about 4.0 standard drinks of alcohol per week. He reports that he does not use drugs. Mr. Klemann has No Known Allergies.  Estimated body mass index is 31.46 kg/m as calculated from the following:   Height as of 08/20/22: 6' (1.829 m).   Weight as of 08/20/22: 232 lb (105.2 kg).  HPI  Today, he is being contacted for worsening of previously known (established) problem  Persistent left knee pain, reviewed left knee x-ray which shows moderate degenerative joint disease.  We discussed viscosupplementation with Hyalgan in his left knee.  Risk and benefits reviewed and patient would like to trial.  We also discussed supplementing with chondroitin/glucosamine over-the-counter complex.  Patient states that he will try that as  well  Laboratory Chemistry  Profile   Renal Lab Results  Component Value Date   BUN 24 (H) 06/06/2022   CREATININE 1.26 (H) 06/06/2022   GFRAA >60 02/21/2020   GFRNONAA 60 (L) 06/06/2022    Hepatic Lab Results  Component Value Date   AST 18 03/10/2018   ALT 16 (L) 03/10/2018   ALBUMIN 3.2 (L) 03/10/2018   ALKPHOS 57 03/10/2018    Electrolytes Lab Results  Component Value Date   NA 140 06/06/2022   K 3.9 06/06/2022   CL 109 06/06/2022   CALCIUM 9.1 06/06/2022    Bone No results found for: "VD25OH", "VD125OH2TOT", "WU9811BJ4", "NW2956OZ3", "25OHVITD1", "25OHVITD2", "25OHVITD3", "TESTOFREE", "TESTOSTERONE"  Inflammation (CRP: Acute Phase) (ESR: Chronic Phase) No results found for: "CRP", "ESRSEDRATE", "LATICACIDVEN"       Note: Above Lab results reviewed.  Imaging  DG Knee Complete 4 Views Left CLINICAL DATA:  Chronic left knee pain.  EXAM: LEFT KNEE - COMPLETE 4+ VIEW  COMPARISON:  None Available.  FINDINGS: No evidence of fracture, dislocation, or joint effusion. Moderate narrowing of medial joint space is noted. Mild narrowing of lateral joint space is noted. Moderate patellar spurring is noted. Soft tissues are unremarkable.  IMPRESSION: Mild to moderate degenerative joint disease. No acute abnormality seen.  Electronically Signed   By: Marijo Conception M.D.   On: 09/27/2022 12:41  Assessment  The primary encounter diagnosis was Primary osteoarthritis of left knee. A diagnosis of Chronic pain syndrome was also pertinent to this visit.  Plan of Care   1. Primary osteoarthritis of left knee - KNEE INJECTION; Future  2. Chronic pain syndrome - KNEE INJECTION; Future  Also recommend patient try over-the-counter glucosamine/chondroitin complex  Orders:  Orders Placed This Encounter  Procedures   KNEE INJECTION    Indications: Knee arthralgia (pain) due to osteoarthritis (OA) Imaging: None (CPT-20610) Position: Sitting Equipment/Materials: Block  tray  1.5", 25-G (one per side)  Local anesthetic  Hyalgan (one per side)    Standing Status:   Future    Standing Expiration Date:   10/31/2023    Scheduling Instructions:     Procedure: Knee injection Hyalgan (Hyaluronan/Hyaluronic acid)     Treatment No.1     Level: Intra-articular     Laterality: LEFT     Sedation: Patient's choice.    Order Specific Question:   Where will this procedure be performed?    Answer:   [facility]   Follow-up plan:   Return in about 6 days (around 11/05/2022) for left knee hyalgan , in clinic [facility].     Status post left L5-S1 ESI #1 on 05/06/2019, and #2 on 06/17/2019 as well as left sacroiliac joint injection #1 on 06/17/2019: Right SI joint injection, right piriformis injection on 04/20/2020-helped significantly; LEFT SI joint injection, LEFT piriformis injection on 05/30/20 helped significantly, repeat as needed.  08/08/2020: Diagnostic bilateral lumbar facet medial branch nerve blocks at L3, L4, L5.  Bilateral sacroiliac joint injection       Recent Visits Date Type Provider Dept  09/17/22 Office Visit Gillis Santa, MD [facility]  08/20/22 Procedure visit Gillis Santa, MD [facility]  08/14/22 Office Visit Gillis Santa, MD [facility]  Showing recent visits within past 90 days and meeting all other requirements Today's Visits Date Type Provider Dept  10/30/22 Office Visit Gillis Santa, MD [facility]  Showing today's visits and meeting all other requirements Future Appointments No visits were found meeting these conditions. Showing future appointments within next 90  days and meeting all other requirements  I discussed the assessment and treatment plan with the patient. The patient was provided an opportunity to ask questions and all were answered. The patient agreed with the plan and demonstrated an understanding of the instructions.  Patient advised to call back or seek an in-person evaluation if the  symptoms or condition worsens.  Duration of encounter:69mnutes.  Note by: BGillis Santa MD Date: 10/30/2022; Time: 3:14 PM

## 2022-10-31 NOTE — Patient Instructions (Signed)
______________________________________________________________________  Preparing for your procedure  During your procedure appointment there will be: No Prescription Refills. No disability issues to discussed. No medication changes or discussions.  Instructions: Food intake: Avoid eating anything solid for at least 8 hours prior to your procedure. Clear liquid intake: You may take clear liquids such as water up to 2 hours prior to your procedure. (No carbonated drinks. No soda.) Transportation: Unless otherwise stated by your physician, bring a driver. Morning Medicines: Except for blood thinners, take all of your other morning medications with a sip of water. Make sure to take your heart and blood pressure medicines. If your blood pressure's lower number is above 100, the case will be rescheduled. Blood thinners: If you take a blood thinner, but were not instructed to stop it, call our office [phone] and ask to talk to a nurse. Not stopping a blood thinner prior to certain procedures could lead to serious complications. Diabetics on insulin: Notify the staff so that you can be scheduled 1st case in the morning. If your diabetes requires high dose insulin, take only  of your normal insulin dose the morning of the procedure and notify the staff that you have done so. Preventing infections: Shower with an antibacterial soap the morning of your procedure.  Build-up your immune system: Take 1000 mg of Vitamin C with every meal (3 times a day) the day prior to your procedure. Antibiotics: Inform the nursing staff if you are taking any antibiotics or if you have any conditions that may require antibiotics prior to procedures. (Example: recent joint implants)   Pregnancy: If you are pregnant make sure to notify the nursing staff. Not doing so may result in injury to the fetus, including death.  Sickness: If you have a cold, fever, or any active infections, call and cancel or reschedule your  procedure. Receiving steroids while having an infection may result in complications. Arrival: You must be in the facility at least 30 minutes prior to your scheduled procedure. Tardiness: Your scheduled time is also the cutoff time. If you do not arrive at least 15 minutes prior to your procedure, you will be rescheduled.  Children: Do not bring any children with you. Make arrangements to keep them home. Dress appropriately: There is always a possibility that your clothing may get soiled. Avoid long dresses. Valuables: Do not bring any jewelry or valuables.  Reasons to call and reschedule or cancel your procedure: (Following these recommendations will minimize the risk of a serious complication.) Surgeries: Avoid having procedures within 2 weeks of any surgery. (Avoid for 2 weeks before or after any surgery). Flu Shots: Avoid having procedures within 2 weeks of a flu shots or . (Avoid for 2 weeks before or after immunizations). Barium: Avoid having a procedure within 7-10 days after having had a radiological study involving the use of radiological contrast. (Myelograms, Barium swallow or enema study). Heart attacks: Avoid any elective procedures or surgeries for the initial 6 months after a "Myocardial Infarction" (Heart Attack). Blood thinners: It is imperative that you stop these medications before procedures. Let us know if you if you take any blood thinner.  Infection: Avoid procedures during or within two weeks of an infection (including chest colds or gastrointestinal problems). Symptoms associated with infections include: Localized redness, fever, chills, night sweats or profuse sweating, burning sensation when voiding, cough, congestion, stuffiness, runny nose, sore throat, diarrhea, nausea, vomiting, cold or Flu symptoms, recent or current infections. It is specially important if the infection is   over the area that we intend to treat. Heart and lung problems: Symptoms that may suggest an  active cardiopulmonary problem include: cough, chest pain, breathing difficulties or shortness of breath, dizziness, ankle swelling, uncontrolled high or unusually low blood pressure, and/or palpitations. If you are experiencing any of these symptoms, cancel your procedure and contact your primary care physician for an evaluation.  Remember:  Regular Business hours are:  Monday to Thursday 8:00 AM to 4:00 PM  Provider's Schedule: Milinda Pointer, MD:  Procedure days: Tuesday and Thursday 7:30 AM to 4:00 PM  Gillis Santa, MD:  Procedure days: Monday and Wednesday 7:30 AM to 4:00 PM  ______________________________________________________________________

## 2022-11-08 ENCOUNTER — Telehealth: Payer: Self-pay | Admitting: Student in an Organized Health Care Education/Training Program

## 2022-11-08 DIAGNOSIS — G5702 Lesion of sciatic nerve, left lower limb: Secondary | ICD-10-CM

## 2022-11-08 DIAGNOSIS — M533 Sacrococcygeal disorders, not elsewhere classified: Secondary | ICD-10-CM

## 2022-11-08 NOTE — Telephone Encounter (Signed)
1. Sacroiliac joint pain - SACROILIAC JOINT INJECTION; Future  2. Piriformis syndrome, left - TRIGGER POINT INJECTION; Future

## 2022-11-08 NOTE — Telephone Encounter (Signed)
PT has an procedure scheduled for Monday wants to see if he will able to get injection in his lower left SI joint.

## 2022-11-12 ENCOUNTER — Ambulatory Visit
Admission: RE | Admit: 2022-11-12 | Discharge: 2022-11-12 | Disposition: A | Discharge status: Home / Self Care | Payer: PPO | Source: Ambulatory Visit | Attending: Student in an Organized Health Care Education/Training Program | Admitting: Student in an Organized Health Care Education/Training Program

## 2022-11-12 ENCOUNTER — Other Ambulatory Visit: Payer: Self-pay

## 2022-11-12 ENCOUNTER — Encounter: Payer: Self-pay | Admitting: Student in an Organized Health Care Education/Training Program

## 2022-11-12 ENCOUNTER — Ambulatory Visit
Payer: PPO | Attending: Student in an Organized Health Care Education/Training Program | Admitting: Student in an Organized Health Care Education/Training Program

## 2022-11-12 VITALS — BP 210/104 | HR 58 | Temp 98.3°F | Resp 16 | Ht 72.0 in | Wt 225.0 lb

## 2022-11-12 DIAGNOSIS — G5702 Lesion of sciatic nerve, left lower limb: Secondary | ICD-10-CM | POA: Diagnosis not present

## 2022-11-12 DIAGNOSIS — M1712 Unilateral primary osteoarthritis, left knee: Secondary | ICD-10-CM | POA: Diagnosis present

## 2022-11-12 DIAGNOSIS — G894 Chronic pain syndrome: Secondary | ICD-10-CM | POA: Diagnosis not present

## 2022-11-12 DIAGNOSIS — M25552 Pain in left hip: Secondary | ICD-10-CM | POA: Diagnosis not present

## 2022-11-12 DIAGNOSIS — Z96643 Presence of artificial hip joint, bilateral: Secondary | ICD-10-CM | POA: Diagnosis not present

## 2022-11-12 DIAGNOSIS — M533 Sacrococcygeal disorders, not elsewhere classified: Secondary | ICD-10-CM | POA: Diagnosis not present

## 2022-11-12 MED ORDER — ROPIVACAINE HCL 2 MG/ML IJ SOLN
INTRAMUSCULAR | Status: AC
  Filled 2022-11-12: qty 20

## 2022-11-12 MED ORDER — SODIUM HYALURONATE (VISCOSUP) 20 MG/2ML IX SOSY
2.0000 mL | PREFILLED_SYRINGE | Freq: Once | INTRA_ARTICULAR | Status: AC
  Administered 2022-11-12: 20 mg via INTRA_ARTICULAR

## 2022-11-12 MED ORDER — LIDOCAINE HCL (PF) 2 % IJ SOLN
INTRAMUSCULAR | Status: AC
  Filled 2022-11-12: qty 10

## 2022-11-12 MED ORDER — ROPIVACAINE HCL 2 MG/ML IJ SOLN
9.0000 mL | Freq: Once | INTRAMUSCULAR | Status: AC
  Administered 2022-11-12: 9 mL via INTRA_ARTICULAR

## 2022-11-12 MED ORDER — METHYLPREDNISOLONE ACETATE 40 MG/ML IJ SUSP
INTRAMUSCULAR | Status: AC
  Filled 2022-11-12: qty 1

## 2022-11-12 MED ORDER — LIDOCAINE HCL (PF) 2 % IJ SOLN
5.0000 mL | Freq: Once | INTRAMUSCULAR | Status: AC
  Administered 2022-11-12: 5 mL
  Filled 2022-11-12: qty 5

## 2022-11-12 MED ORDER — IOHEXOL 180 MG/ML  SOLN
INTRAMUSCULAR | Status: AC
  Filled 2022-11-12: qty 20

## 2022-11-12 MED ORDER — IOHEXOL 180 MG/ML  SOLN
10.0000 mL | Freq: Once | INTRAMUSCULAR | Status: AC
  Administered 2022-11-12: 10 mL via INTRA_ARTICULAR

## 2022-11-12 MED ORDER — DEXAMETHASONE SODIUM PHOSPHATE 10 MG/ML IJ SOLN
10.0000 mg | Freq: Once | INTRAMUSCULAR | Status: AC
  Administered 2022-11-12: 10 mg

## 2022-11-12 MED ORDER — METHYLPREDNISOLONE ACETATE 40 MG/ML IJ SUSP
40.0000 mg | Freq: Once | INTRAMUSCULAR | Status: AC
  Administered 2022-11-12: 40 mg via INTRA_ARTICULAR

## 2022-11-12 NOTE — Patient Instructions (Signed)
Pain Management Discharge Instructions  General Discharge Instructions :  If you need to reach your doctor call: Monday-Friday 8:00 am - 4:00 pm at [phone] or toll free [phone].  After clinic hours [phone] to have operator reach doctor.  Bring all of your medication bottles to all your appointments in the pain clinic.  To cancel or reschedule your appointment with Pain Management please remember to call 24 hours in advance to avoid a fee.  Refer to the educational materials which you have been given on: General Risks, I had my Procedure. Discharge Instructions, Post Sedation.  Post Procedure Instructions:  The drugs you were given will stay in your system until tomorrow, so for the next 24 hours you should not drive, make any legal decisions or drink any alcoholic beverages.  You may eat anything you prefer, but it is better to start with liquids then soups and crackers, and gradually work up to solid foods.  Please notify your doctor immediately if you have any unusual bleeding, trouble breathing or pain that is not related to your normal pain.  Depending on the type of procedure that was done, some parts of your body may feel week and/or numb.  This usually clears up by tonight or the next day.  Walk with the use of an assistive device or accompanied by an adult for the 24 hours.  You may use ice on the affected area for the first 24 hours.  Put ice in a Ziploc bag and cover with a towel and place against area 15 minutes on 15 minutes off.  You may switch to heat after 24 hours.Sacroiliac (SI) Joint Injection Patient Information  Description: The sacroiliac joint connects the scrum (very low back and tailbone) to the ilium (a pelvic bone which also forms half of the hip joint).  Normally this joint experiences very little motion.  When this joint becomes inflamed or unstable low back and or hip and pelvis pain may result.  Injection of this joint with local anesthetics  (numbing medicines) and steroids can provide diagnostic information and reduce pain.  This injection is performed with the aid of x-ray guidance into the tailbone area while you are lying on your stomach.   You may experience an electrical sensation down the leg while this is being done.  You may also experience numbness.  We also may ask if we are reproducing your normal pain during the injection.  Conditions which may be treated SI injection:  Low back, buttock, hip or leg pain  Preparation for the Injection:  Do not eat any solid food or dairy products within 8 hours of your appointment.  You may drink clear liquids up to 3 hours before appointment.  Clear liquids include water, black coffee, juice or soda.  No milk or cream please. You may take your regular medications, including pain medications with a sip of water before your appointment.  Diabetics should hold regular insulin (if take separately) and take 1/2 normal NPH dose the morning of the procedure.  Carry some sugar containing items with you to your appointment. A driver must accompany you and be prepared to drive you home after your procedure. Bring all of your current medications with you. An IV may be inserted and sedation may be given at the discretion of the physician. A blood pressure cuff, EKG and other monitors will often be applied during the procedure.  Some patients may need to have extra oxygen administered for a short period.  You will   be asked to provide medical information, including your allergies, prior to the procedure.  We must know immediately if you are taking blood thinners (like Coumadin/Warfarin) or if you are allergic to IV iodine contrast (dye).  We must know if you could possible be pregnant.  Possible side effects:  Bleeding from needle site Infection (rare, may require surgery) Nerve injury (rare) Numbness & tingling (temporary) A brief convulsion or seizure Light-headedness (temporary) Pain at  injection site (several days) Decreased blood pressure (temporary) Weakness in the leg (temporary)   Call if you experience:  New onset weakness or numbness of an extremity below the injection site that last more than 8 hours. Hives or difficulty breathing ( go to the emergency room) Inflammation or drainage at the injection site Any new symptoms which are concerning to you  Please note:  Although the local anesthetic injected can often make your back/ hip/ buttock/ leg feel good for several hours after the injections, the pain will likely return.  It takes 3-7 days for steroids to work in the sacroiliac area.  You may not notice any pain relief for at least that one week.  If effective, we will often do a series of three injections spaced 3-6 weeks apart to maximally decrease your pain.  After the initial series, we generally will wait some months before a repeat injection of the same type.  If you have any questions, please call [phone] [facility]   

## 2022-11-12 NOTE — Progress Notes (Signed)
PROVIDER NOTE: Interpretation of information contained herein should be left to medically-trained personnel. Specific patient instructions are provided elsewhere under "Patient Instructions" section of medical record. This document was created in part using STT-dictation technology, any transcriptional errors that may result from this process are unintentional.  Patient: Ricardo Winters. Type: Established DOB: 05-Oct-1946 MRN: [id number] PCP: System, Provider Not In  Service: Procedure DOS: 11/12/2022 Setting: Ambulatory Location: Ambulatory outpatient facility Delivery: Face-to-face Provider: Gillis Santa, MD Specialty: Interventional Pain Management Specialty designation: 09 Location: Outpatient facility Ref. Prov.: No ref. provider found    Primary Reason for Visit: Interventional Pain Management Treatment. CC: Knee Pain (left) and Hip Pain (left)   Procedure:           Type: Hyalgan Intra-articular Knee Injection #1  Laterality: Left (-LT) Level/approach: Medial Anesthesia: Local anesthesia (1-2% Lidocaine) DOS: 11/12/2022  Performed by: Gillis Santa, MD  Purpose: Diagnostic/Therapeutic Indications: Knee arthralgia associated to osteoarthritis of the knee Left Knee OA   NAS-11 score:   Pre-procedure: 5 /10   Post-procedure: 5 /10     Pre-Procedure Preparation  Monitoring: As per clinic protocol.  Risk Assessment: Vitals:  GYI:[id number] body mass index is 30.52 kg/m as calculated from the following:   Height as of this encounter: 6' (1.829 m).   Weight as of this encounter: 225 lb (102.1 kg)., Rate:(!) 58 , BP:(!) 194/92, Resp:18, Temp:98.3 F (36.8 C), SpO2:100 %  Allergies: He has No Known Allergies.  Precautions: No additional precautions required  Blood-thinner(s): None at this time  Coagulopathies: Reviewed. None identified.   Active Infection(s): Reviewed. None identified. Mr. Stanforth is afebrile   Location setting: Exam room Position: Sitting w/ knee bent  90 degrees Safety Precautions: Patient was assessed for positional comfort and pressure points before starting the procedure. Prepping solution: DuraPrep (Iodine Povacrylex [0.7% available iodine] and Isopropyl Alcohol, 74% w/w) Prep Area: Entire knee region Approach: percutaneous, just above the tibial plateau, lateral to the infrapatellar tendon. Intended target: Intra-articular knee space Materials: Tray: Block Needle(s): Regular Qty: 1/side Length: 1.5-inch Gauge: 25G   Meds ordered this encounter  Medications   Sodium Hyaluronate (Viscosup) SOSY 20 mg    Do not substitute. Deliver to facility day before procedure.   lidocaine HCl (PF) (XYLOCAINE) 2 % injection 5 mL    No orders of the defined types were placed in this encounter.    Time-out:   I initiated and conducted the "Time-out" before starting the procedure, as per protocol. The patient was asked to participate by confirming the accuracy of the "Time Out" information. Verification of the correct person, site, and procedure were performed and confirmed by me, the nursing staff, and the patient. "Time-out" conducted as per Joint Commission's Universal Protocol (UP.01.01.01). Procedure checklist: Completed   H&P (Pre-op  Assessment)  Mr. Staup is a 77 y.o. (year old), male patient, seen today for interventional treatment. He  has a past surgical history that includes Shoulder acromioplasty; Colonoscopy with propofol (N/A, 12/12/2015); Total hip arthroplasty (Bilateral); Knee surgery; Cardiac catheterization (2015); and Cataract extraction w/PHACO (Right, 11/30/2020). Mr. Enberg has a current medication list which includes the following prescription(s): vitamin c, aspirin ec, black pepper-turmeric, vitamin d3, collagen, ezetimibe, ferrous gluconate, grape seed, losartan, nitroglycerin, omega-3, quercetin, sildenafil, cyanocobalamin, and zinc sulfate, and the following Facility-Administered Medications: lidocaine hcl (pf) and sodium  hyaluronate (viscosup). His primarily concern today is the Knee Pain (left) and Hip Pain (left)  He has No Known Allergies.   Last encounter: My last encounter  with him was on 11/08/2022. Pertinent problems: Mr. Dugue has Primary osteoarthritis of left hip; Status post total replacement of right hip; Primary osteoarthritis of left knee; Chronic left SI joint pain; Sacroiliac joint dysfunction of left side; Chronic pain syndrome; Piriformis syndrome, right; Derangement of right SI joint; and Piriformis syndrome, left on their pertinent problem list. Pain Assessment: Severity of   is reported as a 5 /10. Location: Hip Left/left. Onset: More than a month ago. Quality: Aching, Constant, Dull. Timing: Constant. Modifying factor(s): NSAIDS, CBD topical lotion. Vitals:  height is 6' (1.829 m) and weight is 225 lb (102.1 kg). His temporal temperature is 98.3 F (36.8 C). His blood pressure is 194/92 (abnormal) and his pulse is 58 (abnormal). His respiration is 18 and oxygen saturation is 100%.   Reason for encounter: "interventional pain management therapy due pain of at least four (4) weeks in duration, with failure to respond and/or inability to tolerate more conservative care.   Site Confirmation: Mr. Trulson was asked to confirm the procedure and laterality before marking the site.  Consent: Before the procedure and under the influence of no sedative(s), amnesic(s), or anxiolytics, the patient was informed of the treatment options, risks and possible complications. To fulfill our ethical and legal obligations, as recommended by the American Medical Association's Code of Ethics, I have informed the patient of my clinical impression; the nature and purpose of the treatment or procedure; the risks, benefits, and possible complications of the intervention; the alternatives, including doing nothing; the risk(s) and benefit(s) of the alternative treatment(s) or procedure(s); and the risk(s) and benefit(s) of doing  nothing. The patient was provided information about the general risks and possible complications associated with the procedure. These may include, but are not limited to: failure to achieve desired goals, infection, bleeding, organ or nerve damage, allergic reactions, paralysis, and death. In addition, the patient was informed of those risks and complications associated to Spine-related procedures, such as failure to decrease pain; infection (i.e.: Meningitis, epidural or intraspinal abscess); bleeding (i.e.: epidural hematoma, subarachnoid hemorrhage, or any other type of intraspinal or peri-dural bleeding); organ or nerve damage (i.e.: Any type of peripheral nerve, nerve root, or spinal cord injury) with subsequent damage to sensory, motor, and/or autonomic systems, resulting in permanent pain, numbness, and/or weakness of one or several areas of the body; allergic reactions; (i.e.: anaphylactic reaction); and/or death. Furthermore, the patient was informed of those risks and complications associated with the medications. These include, but are not limited to: allergic reactions (i.e.: anaphylactic or anaphylactoid reaction(s)); adrenal axis suppression; blood sugar elevation that in diabetics may result in ketoacidosis or comma; water retention that in patients with history of congestive heart failure may result in shortness of breath, pulmonary edema, and decompensation with resultant heart failure; weight gain; swelling or edema; medication-induced neural toxicity; particulate matter embolism and blood vessel occlusion with resultant organ, and/or nervous system infarction; and/or aseptic necrosis of one or more joints. Finally, the patient was informed that Medicine is not an exact science; therefore, there is also the possibility of unforeseen or unpredictable risks and/or possible complications that may result in a catastrophic outcome. The patient indicated having understood very clearly. We have given  the patient no guarantees and we have made no promises. Enough time was given to the patient to ask questions, all of which were answered to the patient's satisfaction. Mr. Degen has indicated that he wanted to continue with the procedure. Attestation: I, the ordering provider, attest that I have discussed  with the patient the benefits, risks, side-effects, alternatives, likelihood of achieving goals, and potential problems during recovery for the procedure that I have provided informed consent.  Date  Time: 11/12/2022  8:58 AM   Prophylactic antibiotics  Anti-infectives (From admission, onward)    None      Indication(s): None identified   Description of procedure   Start Time:   hrs  Local Anesthesia: Once the patient was positioned, prepped, and time-out was completed. The target area was identified located. The skin was marked with an approved surgical skin marker. Once marked, the skin (epidermis, dermis, and hypodermis), and deeper tissues (fat, connective tissue and muscle) were infiltrated with a small amount of a short-acting local anesthetic, loaded on a 10cc syringe with a 25G, 1.5-in  Needle. An appropriate amount of time was allowed for local anesthetics to take effect before proceeding to the next step. Local Anesthetic: Lidocaine 1-2% The unused portion of the local anesthetic was discarded in the proper designated containers. Safety Precautions: Aspiration looking for blood return was conducted prior to all injections. At no point did I inject any substances, as a needle was being advanced. Before injecting, the patient was told to immediately notify me if he was experiencing any new onset of "ringing in the ears, or metallic taste in the mouth". No attempts were made at seeking any paresthesias. Safe injection practices and needle disposal techniques used. Medications properly checked for expiration dates. SDV (single dose vial) medications used. After the completion of the  procedure, all disposable equipment used was discarded in the proper designated medical waste containers.  Technical description: Protocol guidelines were followed. After positioning, the target area was identified and prepped in the usual manner. Skin & deeper tissues infiltrated with local anesthetic. Appropriate amount of time allowed to pass for local anesthetics to take effect. Proper needle placement secured. Once satisfactory needle placement was confirmed, I proceeded to inject the desired solution in slow, incremental fashion, intermittently assessing for discomfort or any signs of abnormal or undesired spread of substance. Once completed, the needle was removed and disposed of, as per hospital protocols. The area was cleaned, making sure to leave some of the prepping solution back to take advantage of its long term bactericidal properties.  Aspiration:  Negative          Vitals:   11/12/22 0901  BP: (!) 194/92  Pulse: (!) 58  Resp: 18  Temp: 98.3 F (36.8 C)  TempSrc: Temporal  SpO2: 100%  Weight: 225 lb (102.1 kg)  Height: 6' (1.829 m)    End Time:   hrs   Post-op assessment  Post-procedure Vital Signs:  Pulse/HCG Rate: (!) 58  Temp: 98.3 F (36.8 C) Resp: 18 BP: (!) 194/92 SpO2: 100 %  EBL: None  Complications: No immediate post-treatment complications observed by team, or reported by patient.  Note: The patient tolerated the entire procedure well. A repeat set of vitals were taken after the procedure and the patient was kept under observation following institutional policy, for this type of procedure. Post-procedural neurological assessment was performed, showing return to baseline, prior to discharge. The patient was provided with post-procedure discharge instructions, including a section on how to identify potential problems. Should any problems arise concerning this procedure, the patient was given instructions to immediately contact us, at any time, without  hesitation. In any case, we plan to contact the patient by telephone for a follow-up status report regarding this interventional procedure.  Comments:  No additional relevant  information.   Plan of care   Medications administered: Ricardo Winters. "[name]" had no medications administered during this visit.  Follow-up plan:   No follow-ups on file.      Status post left L5-S1 ESI #1 on 05/06/2019, and #2 on 06/17/2019 as well as left sacroiliac joint injection #1 on 06/17/2019: Right SI joint injection, right piriformis injection on 04/20/2020-helped significantly; LEFT SI joint injection, LEFT piriformis injection on 05/30/20 helped significantly, repeat as needed.  08/08/2020: Diagnostic bilateral lumbar facet medial branch nerve blocks at L3, L4, L5.  Bilateral sacroiliac joint injection        Recent Visits Date Type Provider Dept  10/30/22 Office Visit Gillis Santa, MD [facility]  09/17/22 Office Visit Gillis Santa, MD [facility]  08/20/22 Procedure visit Gillis Santa, MD [facility]  08/14/22 Office Visit Gillis Santa, MD [facility]  Showing recent visits within past 90 days and meeting all other requirements Today's Visits Date Type Provider Dept  11/12/22 Procedure visit Gillis Santa, MD [facility]  Showing today's visits and meeting all other requirements Future Appointments No visits were found meeting these conditions. Showing future appointments within next 90 days and meeting all other requirements   Disposition: Discharge home  Discharge (Date  Time): 11/12/2022;   hrs.   Primary Care Physician: System, Provider Not In Location: [facility] Note by: Gillis Santa, MD Date: 11/12/2022; Time: 9:10 AM  DISCLAIMER: Medicine is not an exact science. It has no guarantees or warranties. The decision to proceed with this intervention was based on the information collected from the patient.  Conclusions were drawn from the patient's questionnaire, interview, and examination. Because information was provided in large part by the patient, it cannot be guaranteed that it has not been purposely or unconsciously manipulated or altered. Every effort has been made to obtain as much accurate, relevant, available data as possible. Always take into account that the treatment will also be dependent on availability of resources and existing treatment guidelines, considered by other Pain Management Specialists as being common knowledge and practice, at the time of the intervention. It is also important to point out that variation in procedural techniques and pharmacological choices are the acceptable norm. For Medico-Legal review purposes, the indications, contraindications, technique, and results of the these procedures should only be evaluated, judged and interpreted by a Board-Certified Interventional Pain Specialist with extensive familiarity and expertise in the same exact procedure and technique.

## 2022-11-12 NOTE — Progress Notes (Signed)
Safety precautions to be maintained throughout the outpatient stay will include: orient to surroundings, keep bed in low position, maintain call bell within reach at all times, provide assistance with transfer out of bed and ambulation.  

## 2022-11-12 NOTE — Progress Notes (Signed)
PROVIDER NOTE: Information contained herein reflects review and annotations entered in association with encounter. Interpretation of such information and data should be left to medically-trained personnel. Information provided to patient can be located elsewhere in the medical record under "Patient Instructions". Document created using STT-dictation technology, any transcriptional errors that may result from process are unintentional.    Patient: Ricardo Winters.  Service Category: Procedure  Provider: Gillis Santa, MD  DOB: 1946/08/22  DOS: 11/12/2022  Location: [facility]  MRN: [id number]  Setting: Ambulatory - outpatient  Referring Provider: No ref. provider found  Type: Established Patient  Specialty: Interventional Pain Management  PCP: System, Provider Not In   Primary Reason for Visit: Interventional Pain Management Treatment. 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 (year old), male patient, seen today for interventional treatment. He  has a past surgical history that includes Shoulder acromioplasty; Colonoscopy with propofol (N/A, 12/12/2015); Total hip arthroplasty (Bilateral); Knee surgery; Cardiac catheterization (2015); and Cataract extraction w/PHACO (Right, 11/30/2020). Ricardo Winters has a current medication list which includes the following prescription(s): vitamin c, aspirin ec, black pepper-turmeric, vitamin d3, collagen, ezetimibe, ferrous gluconate, grape seed,  losartan, nitroglycerin, omega-3, quercetin, sildenafil, cyanocobalamin, and zinc sulfate. His primarily concern today is the Knee Pain (left) and Hip Pain (left)  Initial Vital Signs:  Pulse/HCG Rate: (!) 58ECG Heart Rate: (!) 58 Temp: 98.3 F (36.8 C) Resp: 18 BP: (!) 194/92 SpO2: 100 %  BMI: Estimated body mass index is 30.52 kg/m as calculated from the following:   Height as of this encounter: 6' (1.829 m).   Weight as of this encounter: 225 lb (102.1 kg).  Risk Assessment: Allergies: Reviewed. He has No Known Allergies.  Allergy Precautions: None required Coagulopathies: Reviewed. None identified.  Blood-thinner therapy: None at this time Active Infection(s): Reviewed. None identified. Ricardo Winters is afebrile  Site Confirmation: Ricardo Winters was asked to confirm the procedure and laterality before marking the site Procedure checklist: Completed Consent: Before the procedure and under the influence of no sedative(s), amnesic(s), or anxiolytics, the patient was informed of the treatment options, risks and possible complications. To fulfill our ethical and legal obligations, as recommended by the American Medical Association's Code of Ethics, I have informed the patient of my clinical impression; the nature and purpose of the treatment or procedure; the risks, benefits, and possible complications of the intervention; the alternatives, including doing nothing; the risk(s) and benefit(s) of the alternative treatment(s) or procedure(s); and the risk(s) and benefit(s) of doing nothing. The patient was provided information about the general risks and possible complications associated with the procedure. These may include, but are not limited to: failure to achieve desired goals, infection, bleeding, organ or nerve damage, allergic reactions, paralysis, and death. In addition, the patient was informed of those risks and complications associated to the procedure, such as failure to decrease pain;  infection; bleeding; organ or nerve damage with subsequent damage to sensory, motor, and/or autonomic systems,  resulting in permanent pain, numbness, and/or weakness of one or several areas of the body; allergic reactions; (i.e.: anaphylactic reaction); and/or death. Furthermore, the patient was informed of those risks and complications associated with the medications. These include, but are not limited to: allergic reactions (i.e.: anaphylactic or anaphylactoid reaction(s)); adrenal axis suppression; blood sugar elevation that in diabetics may result in ketoacidosis or comma; water retention that in patients with history of congestive heart failure may result in shortness of breath, pulmonary edema, and decompensation with resultant heart failure; weight gain; swelling or edema; medication-induced neural toxicity; particulate matter embolism and blood vessel occlusion with resultant organ, and/or nervous system infarction; and/or aseptic necrosis of one or more joints. Finally, the patient was informed that Medicine is not an exact science; therefore, there is also the possibility of unforeseen or unpredictable risks and/or possible complications that may result in a catastrophic outcome. The patient indicated having understood very clearly. We have given the patient no guarantees and we have made no promises. Enough time was given to the patient to ask questions, all of which were answered to the patient's satisfaction. Ricardo Winters has indicated that he wanted to continue with the procedure. Attestation: I, the ordering provider, attest that I have discussed with the patient the benefits, risks, side-effects, alternatives, likelihood of achieving goals, and potential problems during recovery for the procedure that I have provided informed consent. Date  Time: 11/12/2022  8:58 AM  Pre-Procedure Preparation:  Monitoring: As per clinic protocol. Respiration, ETCO2, SpO2, BP, heart rate and rhythm monitor placed  and checked for adequate function Safety Precautions: Patient was assessed for positional comfort and pressure points before starting the procedure. Time-out: I initiated and conducted the "Time-out" before starting the procedure, as per protocol. The patient was asked to participate by confirming the accuracy of the "Time Out" information. Verification of the correct person, site, and procedure were performed and confirmed by me, the nursing staff, and the patient. "Time-out" conducted as per Joint Commission's Universal Protocol (UP.01.01.01). Time: 0935  Description of Procedure:          Target Area: Inferior, posterior, aspect of the sacroiliac fissure Approach: Posterior, paraspinal, ipsilateral approach. Area Prepped: Entire Lower Lumbosacral Region DuraPrep (Iodine Povacrylex [0.7% available iodine] and Isopropyl Alcohol, 74% w/w) Safety Precautions: Aspiration looking for blood return was conducted prior to all injections. At no point did we inject any substances, as a needle was being advanced. No attempts were made at seeking any paresthesias. Safe injection practices and needle disposal techniques used. Medications properly checked for expiration dates. SDV (single dose vial) medications used. Description of the Procedure: Protocol guidelines were followed. The patient was placed in position over the procedure table. The target area was identified and the area prepped in the usual manner. Skin & deeper tissues infiltrated with local anesthetic. Appropriate amount of time allowed to pass for local anesthetics to take effect. The procedure needle was advanced under fluoroscopic guidance into the sacroiliac joint until a firm endpoint was obtained. Proper needle placement secured. Negative aspiration confirmed. Solution injected in intermittent fashion, asking for systemic symptoms every 0.5cc of injectate. The needles were then removed and the area cleansed, making sure to leave some of the  prepping solution back to take advantage of its long term bactericidal properties. Vitals:   11/12/22 0901 11/12/22 0931 11/12/22 0938 11/12/22 0941  BP: (!) 194/92 (!) 193/103 (!) 200/108 (!) 210/104  Pulse: (!) 58     Resp: '18 16 18 '$ 16  Temp: 98.3 F (36.8 C)     TempSrc: Temporal     SpO2: 100% 99% 97% 97%  Weight: 225 lb (102.1 kg)     Height: 6' (1.829 m)       Start Time: 0935 hrs. End Time: 0940 hrs. Materials:  Needle(s) Type: Spinal Needle Gauge: 25G Length: 3.5-in Medication(s): Please see orders for medications and dosing details. 4 cc solution made of 3 cc of 0.2% ropivacaine, 1 cc of methylprednisolone, 40 mg/cc.  4 cc injected into the LEFT SI joint.   LEFT piriformis injection also performed.  From the inferior border of the sacroiliac joint, needle was advanced 1 cm medial, 1 cm inferior and 1 cm deep to the inferior pole of the SI joint.  Contrast was injected which showed it spreading into the piriformis muscle bed.  5 cc solution made of 4 cc of 0.2% ropivacaine, 1 cc of Decadron 10 mg/cc injected into LEFT piriformis  Imaging Guidance (Non-Spinal):          Type of Imaging Technique: Fluoroscopy Guidance (Non-Spinal) Indication(s): Assistance in needle guidance and placement for procedures requiring needle placement in or near specific anatomical locations not easily accessible without such assistance. Exposure Time: Please see nurses notes. Contrast: Before injecting any contrast, we confirmed that the patient did not have an allergy to iodine, shellfish, or radiological contrast. Once satisfactory needle placement was completed at the desired level, radiological contrast was injected. Contrast injected under live fluoroscopy. No contrast complications. See chart for type and volume of contrast used. Fluoroscopic Guidance: I was personally present during the use of fluoroscopy. "Tunnel Vision Technique" used to obtain the best possible view of the target area.  Parallax error corrected before commencing the procedure. "Direction-depth-direction" technique used to introduce the needle under continuous pulsed fluoroscopy. Once target was reached, antero-posterior, oblique, and lateral fluoroscopic projection used confirm needle placement in all planes. Images permanently stored in EMR. Interpretation: I personally interpreted the imaging intraoperatively. Adequate needle placement confirmed in multiple planes. Appropriate spread of contrast into desired area was observed. No evidence of afferent or efferent intravascular uptake. Permanent images saved into the patient's record.  Antibiotic Prophylaxis:   Anti-infectives (From admission, onward)    None      Indication(s): None identified  Post-operative Assessment:  Post-procedure Vital Signs:  Pulse/HCG Rate: (!) 58(!) 58 Temp: 98.3 F (36.8 C) Resp: 16 BP: (!) 210/104 SpO2: 97 %  EBL: None  Complications: No immediate post-treatment complications observed by team, or reported by patient.  Note: The patient tolerated the entire procedure well. A repeat set of vitals were taken after the procedure and the patient was kept under observation following institutional policy, for this type of procedure. Post-procedural neurological assessment was performed, showing return to baseline, prior to discharge. The patient was provided with post-procedure discharge instructions, including a section on how to identify potential problems. Should any problems arise concerning this procedure, the patient was given instructions to immediately contact us, at any time, without hesitation. In any case, we plan to contact the patient by telephone for a follow-up status report regarding this interventional procedure.  Comments:  No additional relevant information.  Plan of Care  Orders:  Orders Placed This Encounter  Procedures   DG PAIN CLINIC C-ARM 1-60 MIN NO REPORT    Intraoperative interpretation by  procedural physician at [facility].    Standing Status:   Standing    Number of Occurrences:   1    Order  Specific Question:   Reason for exam:    Answer:   Assistance in needle guidance and placement for procedures requiring needle placement in or near specific anatomical locations not easily accessible without such assistance.   Medications ordered for procedure: Meds ordered this encounter  Medications   Sodium Hyaluronate (Viscosup) SOSY 20 mg    Do not substitute. Deliver to facility day before procedure.   lidocaine HCl (PF) (XYLOCAINE) 2 % injection 5 mL   iohexol (OMNIPAQUE) 180 MG/ML injection 10 mL    Must be Myelogram-compatible. If not available, you may substitute with a water-soluble, non-ionic, hypoallergenic, myelogram-compatible radiological contrast medium.   dexamethasone (DECADRON) injection 10 mg   methylPREDNISolone acetate (DEPO-MEDROL) injection 40 mg   ropivacaine (PF) 2 mg/mL (0.2%) (NAROPIN) injection 9 mL   Medications administered: We administered Sodium Hyaluronate (Viscosup), lidocaine HCl (PF), iohexol, dexamethasone, methylPREDNISolone acetate, and ropivacaine (PF) 2 mg/mL (0.2%).  See the medical record for exact dosing, route, and time of administration.  Follow-up plan:   Return in about 4 weeks (around 12/10/2022) for Post Procedure Evaluation, virtual.      Status post left L5-S1 ESI #1 on 05/06/2019, and #2 on 06/17/2019 as well as left sacroiliac joint injection #1 on 06/17/2019: Right SI joint injection, right piriformis injection on 04/20/2020-helped significantly; LEFT SI joint injection, LEFT piriformis injection on 05/30/20, 11/12/22    Recent Visits Date Type Provider Dept  10/30/22 Office Visit Gillis Santa, MD [facility]  09/17/22 Office Visit Gillis Santa, MD [facility]  08/20/22 Procedure visit Gillis Santa, MD [facility]  08/14/22 Office Visit Gillis Santa, MD [facility]  Showing  recent visits within past 90 days and meeting all other requirements Today's Visits Date Type Provider Dept  11/12/22 Procedure visit Gillis Santa, MD [facility]  Showing today's visits and meeting all other requirements Future Appointments Date Type Provider Dept  12/24/22 Appointment Gillis Santa, MD [facility]  Showing future appointments within next 90 days and meeting all other requirements  Disposition: Discharge home  Discharge (Date  Time): 11/12/2022; 0950 hrs.   Primary Care Physician: System, Provider Not In Location: [facility] Note by: Gillis Santa, MD Date: 11/12/2022; Time: 9:57 AM  Disclaimer:  Medicine is not an exact science. The only guarantee in medicine is that nothing is guaranteed. It is important to note that the decision to proceed with this intervention was based on the information collected from the patient. The Data and conclusions were drawn from the patient's questionnaire, the interview, and the physical examination. Because the information was provided in large part by the patient, it cannot be guaranteed that it has not been purposely or unconsciously manipulated. Every effort has been made to obtain as much relevant data as possible for this evaluation. It is important to note that the conclusions that lead to this procedure are derived in large part from the available data. Always take into account that the treatment will also be dependent on availability of resources and existing treatment guidelines, considered by other Pain Management Practitioners as being common knowledge and practice, at the time of the intervention. For Medico-Legal purposes, it is also important to point out that variation in procedural techniques and pharmacological choices are the acceptable norm. The indications, contraindications, technique, and results of the above procedure should only be interpreted and judged by a  Board-Certified Interventional Pain Specialist with extensive familiarity and expertise in the same exact procedure and technique.

## 2022-11-13 ENCOUNTER — Telehealth: Payer: Self-pay

## 2022-11-13 NOTE — Telephone Encounter (Signed)
Post procedure follow up..  Patient states he is doing good 

## 2022-11-13 NOTE — Telephone Encounter (Signed)
Patient would like to know if he still needs to be wearing a brace on the left knee continually and when he can go back to the gym and play golf?

## 2022-11-13 NOTE — Telephone Encounter (Signed)
Patient notified

## 2022-11-21 ENCOUNTER — Emergency Department: Payer: PPO | Admitting: Anesthesiology

## 2022-11-21 ENCOUNTER — Inpatient Hospital Stay
Admission: EM | Admit: 2022-11-21 | Discharge: 2022-12-04 | DRG: 327 | Disposition: A | Discharge status: Home Health | Payer: PPO | Attending: Surgery | Admitting: Surgery

## 2022-11-21 ENCOUNTER — Encounter
Admission: EM | Disposition: A | Discharge status: Home Health | Payer: Self-pay | Source: Home / Self Care | Attending: Surgery

## 2022-11-21 ENCOUNTER — Other Ambulatory Visit: Payer: Self-pay

## 2022-11-21 ENCOUNTER — Emergency Department: Payer: PPO

## 2022-11-21 DIAGNOSIS — Z79899 Other long term (current) drug therapy: Secondary | ICD-10-CM | POA: Diagnosis not present

## 2022-11-21 DIAGNOSIS — D72829 Elevated white blood cell count, unspecified: Secondary | ICD-10-CM | POA: Diagnosis present

## 2022-11-21 DIAGNOSIS — Z8249 Family history of ischemic heart disease and other diseases of the circulatory system: Secondary | ICD-10-CM

## 2022-11-21 DIAGNOSIS — Z96643 Presence of artificial hip joint, bilateral: Secondary | ICD-10-CM | POA: Diagnosis present

## 2022-11-21 DIAGNOSIS — I252 Old myocardial infarction: Secondary | ICD-10-CM

## 2022-11-21 DIAGNOSIS — I2510 Atherosclerotic heart disease of native coronary artery without angina pectoris: Secondary | ICD-10-CM | POA: Diagnosis present

## 2022-11-21 DIAGNOSIS — Z8616 Personal history of COVID-19: Secondary | ICD-10-CM | POA: Diagnosis not present

## 2022-11-21 DIAGNOSIS — K265 Chronic or unspecified duodenal ulcer with perforation: Secondary | ICD-10-CM | POA: Diagnosis present

## 2022-11-21 DIAGNOSIS — Z86718 Personal history of other venous thrombosis and embolism: Secondary | ICD-10-CM

## 2022-11-21 DIAGNOSIS — I160 Hypertensive urgency: Secondary | ICD-10-CM | POA: Diagnosis present

## 2022-11-21 DIAGNOSIS — I10 Essential (primary) hypertension: Secondary | ICD-10-CM | POA: Diagnosis present

## 2022-11-21 DIAGNOSIS — I5022 Chronic systolic (congestive) heart failure: Secondary | ICD-10-CM | POA: Diagnosis present

## 2022-11-21 DIAGNOSIS — I110 Hypertensive heart disease with heart failure: Secondary | ICD-10-CM | POA: Diagnosis present

## 2022-11-21 DIAGNOSIS — F419 Anxiety disorder, unspecified: Secondary | ICD-10-CM | POA: Diagnosis present

## 2022-11-21 DIAGNOSIS — E782 Mixed hyperlipidemia: Secondary | ICD-10-CM | POA: Diagnosis present

## 2022-11-21 DIAGNOSIS — E669 Obesity, unspecified: Secondary | ICD-10-CM | POA: Diagnosis present

## 2022-11-21 DIAGNOSIS — Z85828 Personal history of other malignant neoplasm of skin: Secondary | ICD-10-CM | POA: Diagnosis not present

## 2022-11-21 DIAGNOSIS — Z7982 Long term (current) use of aspirin: Secondary | ICD-10-CM | POA: Diagnosis not present

## 2022-11-21 DIAGNOSIS — Z6833 Body mass index (BMI) 33.0-33.9, adult: Secondary | ICD-10-CM

## 2022-11-21 HISTORY — PX: LAPAROTOMY: SHX154

## 2022-11-21 LAB — CBC WITH DIFFERENTIAL/PLATELET
Abs Immature Granulocytes: 0.11 10*3/uL — ABNORMAL HIGH (ref 0.00–0.07)
Basophils Absolute: 0 10*3/uL (ref 0.0–0.1)
Basophils Relative: 0 %
Eosinophils Absolute: 0 10*3/uL (ref 0.0–0.5)
Eosinophils Relative: 0 %
HCT: 44.3 % (ref 39.0–52.0)
Hemoglobin: 15.5 g/dL (ref 13.0–17.0)
Immature Granulocytes: 1 %
Lymphocytes Relative: 10 %
Lymphs Abs: 1.8 10*3/uL (ref 0.7–4.0)
MCH: 33 pg (ref 26.0–34.0)
MCHC: 35 g/dL (ref 30.0–36.0)
MCV: 94.5 fL (ref 80.0–100.0)
Monocytes Absolute: 1.5 10*3/uL — ABNORMAL HIGH (ref 0.1–1.0)
Monocytes Relative: 8 %
Neutro Abs: 14.4 10*3/uL — ABNORMAL HIGH (ref 1.7–7.7)
Neutrophils Relative %: 81 %
Platelets: 171 10*3/uL (ref 150–400)
RBC: 4.69 MIL/uL (ref 4.22–5.81)
RDW: 12.4 % (ref 11.5–15.5)
WBC: 17.8 10*3/uL — ABNORMAL HIGH (ref 4.0–10.5)
nRBC: 0 % (ref 0.0–0.2)

## 2022-11-21 LAB — COMPREHENSIVE METABOLIC PANEL
ALT: 24 U/L (ref 0–44)
AST: 21 U/L (ref 15–41)
Albumin: 4 g/dL (ref 3.5–5.0)
Alkaline Phosphatase: 71 U/L (ref 38–126)
Anion gap: 10 (ref 5–15)
BUN: 27 mg/dL — ABNORMAL HIGH (ref 8–23)
CO2: 25 mmol/L (ref 22–32)
Calcium: 9.3 mg/dL (ref 8.9–10.3)
Chloride: 102 mmol/L (ref 98–111)
Creatinine, Ser: 1.17 mg/dL (ref 0.61–1.24)
GFR, Estimated: >60 mL/min (ref >60)
Glucose, Bld: 130 mg/dL — ABNORMAL HIGH (ref 70–99)
Potassium: 4 mmol/L (ref 3.5–5.1)
Sodium: 137 mmol/L (ref 135–145)
Total Bilirubin: 1.9 mg/dL — ABNORMAL HIGH (ref 0.3–1.2)
Total Protein: 6.7 g/dL (ref 6.5–8.1)

## 2022-11-21 LAB — URINALYSIS, ROUTINE W REFLEX MICROSCOPIC
Bilirubin Urine: NEGATIVE
Glucose, UA: NEGATIVE mg/dL
Hgb urine dipstick: NEGATIVE
Ketones, ur: NEGATIVE mg/dL
Leukocytes,Ua: NEGATIVE
Nitrite: NEGATIVE
Protein, ur: NEGATIVE mg/dL
Specific Gravity, Urine: 1.027 (ref 1.005–1.030)
pH: 5 (ref 5.0–8.0)

## 2022-11-21 LAB — LIPASE, BLOOD: Lipase: 42 U/L (ref 11–51)

## 2022-11-21 LAB — ABO/RH: ABO/RH(D): A POS

## 2022-11-21 SURGERY — LAPAROTOMY, EXPLORATORY
Anesthesia: General

## 2022-11-21 MED ORDER — SODIUM CHLORIDE 0.9 % IV SOLN: INTRAVENOUS | Status: DC

## 2022-11-21 MED ORDER — EPHEDRINE 5 MG/ML INJ
INTRAVENOUS | Status: AC
  Filled 2022-11-21: qty 5

## 2022-11-21 MED ORDER — HYDROMORPHONE HCL 1 MG/ML IJ SOLN
INTRAMUSCULAR | Status: AC
  Filled 2022-11-21: qty 1

## 2022-11-21 MED ORDER — OXYMETAZOLINE HCL 0.05 % NA SOLN
NASAL | Status: AC
  Filled 2022-11-21: qty 30

## 2022-11-21 MED ORDER — BUPIVACAINE LIPOSOME 1.3 % IJ SUSP
INTRAMUSCULAR | Status: AC
  Filled 2022-11-21: qty 20

## 2022-11-21 MED ORDER — ACETAMINOPHEN 10 MG/ML IV SOLN: 1000.0000 mg | Freq: Once | INTRAVENOUS | Status: DC | PRN

## 2022-11-21 MED ORDER — PROPOFOL 10 MG/ML IV BOLUS
INTRAVENOUS | Status: DC | PRN
  Administered 2022-11-21: 150 mg via INTRAVENOUS

## 2022-11-21 MED ORDER — SODIUM CHLORIDE 0.9% IV SOLUTION: Freq: Once | INTRAVENOUS | Status: DC

## 2022-11-21 MED ORDER — PROMETHAZINE HCL 25 MG/ML IJ SOLN: 6.2500 mg | INTRAMUSCULAR | Status: DC | PRN

## 2022-11-21 MED ORDER — SEPRAFILM FOR OPTIME
ORAL_FILM | TOPICAL | Status: DC | PRN
  Administered 2022-11-21: 2 via TOPICAL

## 2022-11-21 MED ORDER — MORPHINE SULFATE (PF) 2 MG/ML IV SOLN
2.0000 mg | INTRAVENOUS | Status: DC | PRN
  Administered 2022-11-22 (×2): 4 mg via INTRAVENOUS
  Administered 2022-11-22: 2 mg via INTRAVENOUS
  Administered 2022-11-23 (×3): 4 mg via INTRAVENOUS
  Filled 2022-11-21 (×3): qty 2
  Filled 2022-11-21: qty 1
  Filled 2022-11-21 (×2): qty 2

## 2022-11-21 MED ORDER — SODIUM CHLORIDE 0.9 % IV BOLUS: 1000.0000 mL | Freq: Once | INTRAVENOUS | Status: DC

## 2022-11-21 MED ORDER — MICROFIBRILLAR COLL HEMOSTAT EX PADS
MEDICATED_PAD | CUTANEOUS | Status: DC | PRN
  Administered 2022-11-21 (×2): 1 via TOPICAL

## 2022-11-21 MED ORDER — FENTANYL CITRATE (PF) 100 MCG/2ML IJ SOLN
INTRAMUSCULAR | Status: DC | PRN
  Administered 2022-11-21 (×2): 50 ug via INTRAVENOUS

## 2022-11-21 MED ORDER — PIPERACILLIN-TAZOBACTAM 3.375 G IVPB 30 MIN
3.3750 g | Freq: Once | INTRAVENOUS | Status: DC
  Administered 2022-11-21: 3.375 g via INTRAVENOUS

## 2022-11-21 MED ORDER — LACTATED RINGERS IV SOLN: INTRAVENOUS | Status: DC | PRN

## 2022-11-21 MED ORDER — LIDOCAINE HCL (CARDIAC) PF 100 MG/5ML IV SOSY
PREFILLED_SYRINGE | INTRAVENOUS | Status: DC | PRN
  Administered 2022-11-21: 80 mg via INTRAVENOUS

## 2022-11-21 MED ORDER — HYDROMORPHONE HCL 1 MG/ML IJ SOLN
INTRAMUSCULAR | Status: DC | PRN
  Administered 2022-11-21 (×2): .5 mg via INTRAVENOUS

## 2022-11-21 MED ORDER — PIPERACILLIN-TAZOBACTAM 3.375 G IVPB
INTRAVENOUS | Status: AC
  Filled 2022-11-21: qty 50

## 2022-11-21 MED ORDER — ACETAMINOPHEN 10 MG/ML IV SOLN
INTRAVENOUS | Status: AC
  Filled 2022-11-21: qty 100

## 2022-11-21 MED ORDER — BUPIVACAINE HCL (PF) 0.25 % IJ SOLN
INTRAMUSCULAR | Status: AC
  Filled 2022-11-21: qty 30

## 2022-11-21 MED ORDER — PANTOPRAZOLE SODIUM 40 MG IV SOLR: 40.0000 mg | Freq: Once | INTRAVENOUS | Status: DC

## 2022-11-21 MED ORDER — OXYMETAZOLINE HCL 0.05 % NA SOLN
NASAL | Status: DC | PRN
  Administered 2022-11-21: 3 via NASAL

## 2022-11-21 MED ORDER — FENTANYL CITRATE (PF) 100 MCG/2ML IJ SOLN
INTRAMUSCULAR | Status: AC
  Filled 2022-11-21: qty 2

## 2022-11-21 MED ORDER — PIPERACILLIN-TAZOBACTAM 3.375 G IVPB
3.3750 g | Freq: Three times a day (TID) | INTRAVENOUS | Status: AC
  Administered 2022-11-21 – 2022-11-28 (×20): 3.375 g via INTRAVENOUS
  Filled 2022-11-21 (×20): qty 50

## 2022-11-21 MED ORDER — FENTANYL CITRATE (PF) 100 MCG/2ML IJ SOLN
25.0000 ug | INTRAMUSCULAR | Status: DC | PRN
  Administered 2022-11-21: 50 ug via INTRAVENOUS

## 2022-11-21 MED ORDER — OXYCODONE HCL 5 MG PO TABS: 5.0000 mg | ORAL_TABLET | Freq: Once | ORAL | Status: DC | PRN

## 2022-11-21 MED ORDER — DEXAMETHASONE SODIUM PHOSPHATE 10 MG/ML IJ SOLN
INTRAMUSCULAR | Status: DC | PRN
  Administered 2022-11-21: 10 mg via INTRAVENOUS

## 2022-11-21 MED ORDER — SODIUM CHLORIDE 0.9 % IV SOLN
INTRAVENOUS | Status: DC | PRN
  Administered 2022-11-21: 70 mL

## 2022-11-21 MED ORDER — DROPERIDOL 2.5 MG/ML IJ SOLN: 0.6250 mg | Freq: Once | INTRAMUSCULAR | Status: DC | PRN

## 2022-11-21 MED ORDER — DEXMEDETOMIDINE HCL IN NACL 80 MCG/20ML IV SOLN
INTRAVENOUS | Status: AC
  Filled 2022-11-21: qty 20

## 2022-11-21 MED ORDER — SODIUM CHLORIDE (PF) 0.9 % IJ SOLN
INTRAMUSCULAR | Status: AC
  Filled 2022-11-21: qty 50

## 2022-11-21 MED ORDER — METOPROLOL TARTRATE 5 MG/5ML IV SOLN: 5.0000 mg | Freq: Four times a day (QID) | INTRAVENOUS | Status: DC | PRN

## 2022-11-21 MED ORDER — EPHEDRINE SULFATE (PRESSORS) 50 MG/ML IJ SOLN
INTRAMUSCULAR | Status: DC | PRN
  Administered 2022-11-21: 5 mg via INTRAVENOUS
  Administered 2022-11-21 (×4): 10 mg via INTRAVENOUS

## 2022-11-21 MED ORDER — LABETALOL HCL 5 MG/ML IV SOLN
INTRAVENOUS | Status: AC
  Filled 2022-11-21: qty 4

## 2022-11-21 MED ORDER — 0.9 % SODIUM CHLORIDE (POUR BTL) OPTIME
TOPICAL | Status: DC | PRN
  Administered 2022-11-21 (×2): 1000 mL

## 2022-11-21 MED ORDER — ROCURONIUM BROMIDE 10 MG/ML (PF) SYRINGE
PREFILLED_SYRINGE | INTRAVENOUS | Status: AC
  Filled 2022-11-21: qty 10

## 2022-11-21 MED ORDER — BUPIVACAINE-EPINEPHRINE (PF) 0.5% -1:200000 IJ SOLN
INTRAMUSCULAR | Status: AC
  Filled 2022-11-21: qty 30

## 2022-11-21 MED ORDER — ROCURONIUM BROMIDE 100 MG/10ML IV SOLN
INTRAVENOUS | Status: DC | PRN
  Administered 2022-11-21 (×3): 20 mg via INTRAVENOUS
  Administered 2022-11-21: 40 mg via INTRAVENOUS
  Administered 2022-11-21: 60 mg via INTRAVENOUS

## 2022-11-21 MED ORDER — PROPOFOL 10 MG/ML IV BOLUS
INTRAVENOUS | Status: AC
  Filled 2022-11-21: qty 20

## 2022-11-21 MED ORDER — PANTOPRAZOLE SODIUM 40 MG IV SOLR
40.0000 mg | Freq: Two times a day (BID) | INTRAVENOUS | Status: DC
  Administered 2022-11-21 – 2022-12-04 (×26): 40 mg via INTRAVENOUS
  Filled 2022-11-21 (×26): qty 10

## 2022-11-21 MED ORDER — LABETALOL HCL 5 MG/ML IV SOLN
INTRAVENOUS | Status: DC | PRN
  Administered 2022-11-21: 10 mg via INTRAVENOUS

## 2022-11-21 MED ORDER — OXYCODONE HCL 5 MG/5ML PO SOLN: 5.0000 mg | Freq: Once | ORAL | Status: DC | PRN

## 2022-11-21 MED ORDER — EPINEPHRINE PF 1 MG/ML IJ SOLN
INTRAMUSCULAR | Status: AC
  Filled 2022-11-21: qty 1

## 2022-11-21 MED ORDER — BUPIVACAINE-EPINEPHRINE (PF) 0.25% -1:200000 IJ SOLN
INTRAMUSCULAR | Status: DC | PRN
  Administered 2022-11-21: 30 mL

## 2022-11-21 MED ORDER — SUGAMMADEX SODIUM 200 MG/2ML IV SOLN
INTRAVENOUS | Status: DC | PRN
  Administered 2022-11-21: 200 mg via INTRAVENOUS

## 2022-11-21 MED ORDER — ACETAMINOPHEN 10 MG/ML IV SOLN
1000.0000 mg | Freq: Four times a day (QID) | INTRAVENOUS | Status: AC
  Administered 2022-11-21 – 2022-11-22 (×4): 1000 mg via INTRAVENOUS
  Filled 2022-11-21 (×5): qty 100

## 2022-11-21 MED ORDER — METHYLENE BLUE 1 % INJ SOLN
INTRAVENOUS | Status: AC
  Filled 2022-11-21: qty 10

## 2022-11-21 MED ORDER — IOHEXOL 300 MG/ML  SOLN
100.0000 mL | Freq: Once | INTRAMUSCULAR | Status: AC | PRN
  Administered 2022-11-21: 100 mL via INTRAVENOUS

## 2022-11-21 MED ORDER — DEXMEDETOMIDINE HCL IN NACL 200 MCG/50ML IV SOLN
INTRAVENOUS | Status: DC | PRN
  Administered 2022-11-21: 4 ug via INTRAVENOUS
  Administered 2022-11-21: 8 ug via INTRAVENOUS

## 2022-11-21 SURGICAL SUPPLY — 62 items
APPLIER CLIP 11 MED OPEN (CLIP)
APPLIER CLIP 13 LRG OPEN (CLIP)
BARRIER ADH SEPRAFILM 3INX5IN (MISCELLANEOUS) IMPLANT
BLADE CLIPPER SURG (BLADE) ×1 IMPLANT
BULB RESERV EVAC DRAIN JP 100C (MISCELLANEOUS) IMPLANT
CATH ROBINSON RED A/P 18FR (CATHETERS) IMPLANT
CHLORAPREP W/TINT 26 (MISCELLANEOUS) ×1 IMPLANT
CLIP APPLIE 11 MED OPEN (CLIP) IMPLANT
CLIP APPLIE 13 LRG OPEN (CLIP) IMPLANT
COVER BACK TABLE REUSABLE LG (DRAPES) IMPLANT
DRAIN CHANNEL JP 19F (MISCELLANEOUS) IMPLANT
DRAPE LAPAROTOMY 100X77 ABD (DRAPES) ×1 IMPLANT
DRSG OPSITE POSTOP 4X10 (GAUZE/BANDAGES/DRESSINGS) IMPLANT
DRSG OPSITE POSTOP 4X12 (GAUZE/BANDAGES/DRESSINGS) IMPLANT
DRSG TEGADERM 2-3/8X2-3/4 SM (GAUZE/BANDAGES/DRESSINGS) IMPLANT
DRSG TEGADERM 4X4.75 (GAUZE/BANDAGES/DRESSINGS) IMPLANT
ELECT BLADE 6.5 EXT (BLADE) ×1 IMPLANT
ELECT REM PT RETURN 9FT ADLT (ELECTROSURGICAL) ×1
ELECTRODE REM PT RTRN 9FT ADLT (ELECTROSURGICAL) ×1 IMPLANT
GAUZE 4X4 16PLY ~~LOC~~+RFID DBL (SPONGE) IMPLANT
GLOVE BIO SURGEON STRL SZ 6.5 (GLOVE) IMPLANT
GLOVE BIO SURGEON STRL SZ7 (GLOVE) ×2 IMPLANT
GLOVE BIOGEL PI IND STRL 6.5 (GLOVE) IMPLANT
GLOVE SURG SYN 6.5 ES PF (GLOVE) ×1 IMPLANT
GLOVE SURG SYN 6.5 PF PI (GLOVE) IMPLANT
GLOVE SURG SYN 7.0 (GLOVE) ×1 IMPLANT
GLOVE SURG SYN 7.0 PF PI (GLOVE) IMPLANT
GLOVE SURG SYN 7.5  E (GLOVE) ×1
GLOVE SURG SYN 7.5 E (GLOVE) ×1 IMPLANT
GLOVE SURG SYN 7.5 PF PI (GLOVE) IMPLANT
GOWN STRL REUS W/ TWL LRG LVL3 (GOWN DISPOSABLE) ×2 IMPLANT
GOWN STRL REUS W/TWL LRG LVL3 (GOWN DISPOSABLE) ×2
HEMOSTAT SURGICEL 2X14 (HEMOSTASIS) IMPLANT
LIGASURE IMPACT 36 18CM CVD LR (INSTRUMENTS) IMPLANT
MANIFOLD NEPTUNE II (INSTRUMENTS) ×1 IMPLANT
NEEDLE HYPO 22GX1.5 SAFETY (NEEDLE) ×2 IMPLANT
NS IRRIG 1000ML POUR BTL (IV SOLUTION) IMPLANT
PACK BASIN MAJOR ARMC (MISCELLANEOUS) ×1 IMPLANT
PLUG CATH AND CAP STER (CATHETERS) IMPLANT
RELOAD PROXIMATE 75MM BLUE (ENDOMECHANICALS) IMPLANT
RELOAD STAPLE 75 3.8 BLU REG (ENDOMECHANICALS) IMPLANT
SPONGE T-LAP 18X18 ~~LOC~~+RFID (SPONGE) ×1 IMPLANT
SPONGE T-LAP 18X36 ~~LOC~~+RFID STR (SPONGE) IMPLANT
STAPLER PROXIMATE 75MM BLUE (STAPLE) IMPLANT
STAPLER SKIN PROX 35W (STAPLE) ×1 IMPLANT
SUT ETHILON 3-0 FS-10 30 BLK (SUTURE) ×3
SUT PDS AB 0 CT1 27 (SUTURE) ×3 IMPLANT
SUT SILK 2 0 (SUTURE) ×1
SUT SILK 2 0 SH CR/8 (SUTURE) ×1 IMPLANT
SUT SILK 2 0SH CR/8 30 (SUTURE) ×1 IMPLANT
SUT SILK 2-0 18XBRD TIE 12 (SUTURE) ×1 IMPLANT
SUT VIC AB 0 CT1 36 (SUTURE) ×2 IMPLANT
SUT VIC AB 2-0 SH 27 (SUTURE)
SUT VIC AB 2-0 SH 27XBRD (SUTURE) IMPLANT
SUT VIC AB 3-0 SH 27 (SUTURE) ×1
SUT VIC AB 3-0 SH 27X BRD (SUTURE) ×1 IMPLANT
SUTURE EHLN 3-0 FS-10 30 BLK (SUTURE) IMPLANT
SYR 20ML LL LF (SYRINGE) ×2 IMPLANT
SYR 3ML LL SCALE MARK (SYRINGE) ×1 IMPLANT
TRAP FLUID SMOKE EVACUATOR (MISCELLANEOUS) ×1 IMPLANT
TRAY FOLEY MTR SLVR 16FR STAT (SET/KITS/TRAYS/PACK) ×1 IMPLANT
WATER STERILE IRR 500ML POUR (IV SOLUTION) ×1 IMPLANT

## 2022-11-21 NOTE — ED Notes (Signed)
Pt to surgery

## 2022-11-21 NOTE — ED Triage Notes (Signed)
Pt arrives via POV. Pt reports right lower quadrant pain since yesterday. Pt denies any associated symptoms.

## 2022-11-21 NOTE — Anesthesia Preprocedure Evaluation (Addendum)
Anesthesia Evaluation  Patient identified by MRN, date of birth, ID band Patient awake    Reviewed: Allergy & Precautions, H&P , NPO status , Patient's Chart, lab work & pertinent test results  Airway Mallampati: II  TM Distance: >3 FB Neck ROM: full    Dental no notable dental hx.    Pulmonary neg pulmonary ROS   Pulmonary exam normal        Cardiovascular hypertension, + CAD, + Past MI (1995) and + Cardiac Stents  Normal cardiovascular exam     Neuro/Psych negative neurological ROS  negative psych ROS   GI/Hepatic negative GI ROS, Neg liver ROS,,,  Endo/Other  negative endocrine ROS    Renal/GU      Musculoskeletal   Abdominal   Peds  Hematology negative hematology ROS (+)   Anesthesia Other Findings Past Medical History: No date: Anginal pain (H[facility]) No date: Cancer ([place])     Comment:  skin - on the nose about 10-15 years ago  No date: Coronary artery disease No date: COVID-19     Comment:  Dec 2020 No date: Edema No date: Hypertension No date: Myocardial infarction ([place]) No date: Obesity No 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  Past Surgical History: 2015: CARDIAC CATHETERIZATION     Comment:  stent 11/30/2020: CATARACT EXTRACTION W/PHACO; Right     Comment:  Procedure: CATARACT EXTRACTION PHACO AND INTRAOCULAR               LENS PLACEMENT (IOC)  RIGHT 7.70 01:03.4 12.1%;  Surgeon:              [name], MD;  Location: [facility];              Service: Ophthalmology;  Laterality: Right;  requests               early 12/12/2015: COLONOSCOPY WITH PROPOFOL; N/A     Comment:  Procedure: COLONOSCOPY WITH PROPOFOL;  Surgeon: [name], MD;  Location[facility] END[facility]COPY;  Service:               Endoscopy;  Laterality: N/A; No date: KNEE SURGERY No date: SHOULDER ACROMIOPLASTY No date: TOTAL HIP ARTHROPLASTY; Bilateral     Comment:  Right 2017, left 2020  BMI     Body Mass Index: 31.19 kg/m      Reproductive/Obstetrics negative OB ROS                             Anesthesia Physical Anesthesia Plan  ASA: 2 and emergent  Anesthesia Plan: General ETT and General   Post-op Pain Management:    Induction:   PONV Risk Score and Plan: 2 and Ondansetron, Dexamethasone and Midazolam  Airway Management Planned: Oral ETT  Additional Equipment:   Intra-op Plan:   Post-operative Plan: Extubation in OR  Informed Consent: I have reviewed the patients History and Physical, chart, labs and discussed the procedure including the risks, benefits and alternatives for the proposed anesthesia with the patient or authorized representative who has indicated his/her understanding and acceptance.     Dental Advisory Given  Plan Discussed with: CRNA and Surgeon  Anesthesia Plan Comments:         Anesthesia Quick Evaluation

## 2022-11-21 NOTE — ED Notes (Signed)
Pt dressed out for surgery and all belongings placed in bags.

## 2022-11-21 NOTE — ED Notes (Signed)
Informed RN bed assigned 

## 2022-11-21 NOTE — ED Provider Triage Note (Signed)
Emergency Medicine Provider Triage Evaluation Note  Ricardo Winters. , a 77 y.o. male  was evaluated in triage.  Pt complains of RLQ pain that began yesterday. No N/V/D, no fever, no dysuria. Stil has appendix.  Review of Systems  Positive: Rlq pain Negative: N/v/d, fever  Physical Exam  There were no vitals taken for this visit. Gen:   Awake, no distress   Resp:  Normal effort  MSK:   Moves extremities without difficulty  Other:    Medical Decision Making  Medically screening exam initiated at 11:51 AM.  Appropriate orders placed.  Ricardo Winters. was informed that the remainder of the evaluation will be completed by another provider, this initial triage assessment does not replace that evaluation, and the importance of remaining in the ED until their evaluation is complete.     Marquette Old, PA-C 11/21/22 1153

## 2022-11-21 NOTE — Plan of Care (Signed)

## 2022-11-21 NOTE — Op Note (Addendum)
PROCEDURES: Laparotomy reinforcement of perforated duodenal ulcer Feeding jejunostomy Placement blake drain x 2 # 19  Pre-operative Diagnosis: Perforated duodenal ulcer  Post-operative Diagnosis: Same  Surgeon: Domino   Assistants: Dr Olean Ree   Anesthesia: General endotracheal anesthesia  ASA Class: 3   Surgeon: Caroleen Hamman , MD FACS  Anesthesia: Gen. with endotracheal tube  Findings: Perforated duodenal ulcer chronic Purulent fluid ( pus) retroperitoneum  Severe inflammatory response on Right upper quadrant Colotomy due to severe inflammatory response promptly identified and repaired.  Estimated Blood Loss: 100cc         Drains: 19 FR x 2              Condition: stable  Procedure Details  The patient was seen again in the Holding Room. The benefits, complications, treatment options, and expected outcomes were discussed with the patient. The risks of bleeding, infection, recurrence of symptoms, failure to resolve symptoms,  bowel injury, any of which could require further surgery were reviewed with the patient.   The patient was taken to Operating Room, identified as Ricardo Winters. and the procedure verified.  A Time Out was held and the above information confirmed.  Prior to the induction of general anesthesia, antibiotic prophylaxis was administered. VTE prophylaxis was in place. General endotracheal anesthesia was then administered and tolerated well. After the induction, the abdomen was prepped with Chloraprep and draped in the sterile fashion. The patient was positioned in the supine position.  Serous midline laparotomy was performed with a 10 blade knife.  Electrocautery was used to dissect through subcutaneous tissue and the fascia was elevated between 2 Kocher clamps and the abdominal cavity was entered under direct visualization.  No evidence of injuries.  We were able to lift the greater omentum and create a plane between the colon and the stomach.   There was severe inflammatory response with basically plastering and fusion of all the planes within the right upper quadrant and lesser sac.  Order to obtain any count embolization needed to mobilize the hepatic flexure.  A Cattell-Braasch maneuver was performed using electrocautery.  Given the severe inflammatory response there was a small colotomy that was promptly identified and suture with multiple 2-0 silk sutures.  Needed to mobilize the right colon to gain mobilization of the duodenum.  Please note that there was significant inflammatory response.  Meticulous dissection was performed with electrocautery.  Was finally able to identify of the first portion of the duodenum and the second portion of the duodenum.  We proceeded to perform a Kocher maneuver.  Identified the site of perforation very procedure descending duodenum.  It was contained, we rechecked the position of the NG tube and ask anesthesia to give methylene blue.  We failed to identify any blue dye leaking of the lumen of the GI tract..  Given that this was a true contained perforation we decided only to reinforce the perforation with some omentum and placed 2 large 19 Blake drains around the duodenum and C-loop.  I was cognizant of not to enlarge the second portion of the duodenum because those injuries are extremely difficult to heal and repair.. Given also the location of the injury I felt prudent to perform a jejunostomy tube.  This was performed in a Witzel fashion using an 18French Gore catheter.  We also talked to the abdominal wall with multiple 2-0 silk sutures.  Abdominal cavity was irrigated with 2 L of warm saline.  There was no evidence of any  other injuries.  Will also run the small bowel from the ligament of Treitz all the way to to the terminal ileum and there was no injuries.  We also looked at the lesser sac and the fundus without any other injuries.  Liposomal Marcaine was used to perform T AP blocks of both sides of the  abdominal wall under direct visualization.  Seprafilm was used to prevent adhesions.  The fascia was closed with 2 running 0 PDS this using the small bite technique. Wound was closed with staples in the standard fashion  We changed gloves and place a new tray to close the   abdomen with a 2-0 PDS suture in a running fashion and the skin was closed with 4-0 Monocryl. Liposomal Marcaine  was injected on all incision sites under direct visualization. Dermabond was used to coat all the skin incisions. Needle and laparotomy count were correct and there were no immediate occasions Please Note that Dr. Hampton Abbot was essential and required due to the complexity of the case, need for exposure, experienced surgeon required to facilitate the completion of this complex surgery and to provide feedback and surgical opinion regarding surgical judgement.  Caroleen Hamman, MD, FACS

## 2022-11-21 NOTE — Transfer of Care (Signed)
Immediate Anesthesia Transfer of Care Note  Patient: Ricardo Winters.  Procedure(s) Performed: EXPLORATORY LAPAROTOMY  Patient Location: PACU  Anesthesia Type:General  Level of Consciousness: drowsy  Airway & Oxygen Therapy: Patient Spontanous Breathing and Patient connected to face mask oxygen  Post-op Assessment: Report given to RN  Post vital signs: stable  Last Vitals:  Vitals Value Taken Time  BP 133/89 11/21/22 2002  Temp    Pulse 63 11/21/22 2005  Resp 16 11/21/22 2005  SpO2 100 % 11/21/22 2005  Vitals shown include unvalidated device data.  Last Pain:  Vitals:   11/21/22 1537  TempSrc: Temporal  PainSc: 4          Complications: No notable events documented.

## 2022-11-21 NOTE — ED Provider Notes (Signed)
[facility] Provider Note    None    (approximate)   History   Abdominal Pain   HPI  Ricardo Dobek. is a 77 y.o. male who presents today for evaluation of right-sided abdominal pain that began yesterday.  Patient reports that the pain is throughout the entire right side of his abdomen, and he describes it as an aching pain.  He reports that his pain began shortly after eating dinner last night.  He denies nausea or vomiting.  He has not had any diarrhea.  No fevers or chills.  He thought that he had indigestion and took Tums without significant improvement of his symptoms.   Patient Active Problem List   Diagnosis Date Noted   Lumbar spondylosis 07/06/2020   Piriformis syndrome, left 05/26/2020   Elevated PSA 05/12/2020   Piriformis syndrome, right 04/13/2020   Derangement of right SI joint 04/13/2020   History of 2019 novel coronavirus disease (COVID-19) 11/04/2019   Chronic pain syndrome 06/03/2019   Lumbar radiculopathy 06/03/2019   Medicare annual wellness visit, initial 03/04/2019   Primary osteoarthritis of left knee 06/02/2018   Chronic left SI joint pain 06/02/2018   Sacroiliac joint dysfunction of left side 06/02/2018   Syncope 03/09/2018   Elevated troponin 03/09/2018   CAD (coronary artery disease) 03/09/2018   HTN (hypertension) 03/09/2018   B12 deficiency 11/22/2017   Hyperlipidemia, mixed 11/08/2017   Chronic left hip pain 07/08/2017   Tubular adenoma 11/16/2016   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      Excl. in [place]?     Most recent vital signs: Vitals:   11/21/22 1152  BP: 139/85  Pulse: 89  Resp: 17  Temp: 98.3 F (36.8 C)  SpO2: 100%    Physical Exam Vitals and nursing note reviewed.  Constitutional:      General: Awake and alert. No acute distress.    Appearance: Normal appearance. The patient is normal weight.  HENT:     Head: Normocephalic and atraumatic.     Mouth: Mucous membranes are moist.  Eyes:     General: PERRL. Normal EOMs        Right eye: No discharge.        Left eye: No discharge.     Conjunctiva/sclera: Conjunctivae normal.  Cardiovascular:     Rate and Rhythm: Normal rate and regular rhythm.     Pulses: Normal pulses.  Pulmonary:     Effort: Pulmonary effort is normal. No respiratory distress.     Breath sounds: Normal breath sounds.  Abdominal:     Abdomen is soft. There is mild right-sided abdominal tenderness. No rebound  or guarding. No distention. Musculoskeletal:        General: No swelling. Normal range of motion.     Cervical back: Normal range of motion and neck supple.  Skin:    General: Skin is warm and dry.     Capillary Refill: Capillary refill takes less than 2 seconds.     Findings: No rash.  Neurological:     Mental Status: The patient is awake and alert.      ED Results / Procedures / Treatments   Labs (all labs ordered are listed, but only abnormal results are displayed) Labs Reviewed  CBC WITH DIFFERENTIAL/PLATELET - Abnormal; Notable for the following components:      Result Value   WBC 17.8 (*)    Neutro Abs 14.4 (*)    Monocytes Absolute 1.5 (*)    Abs Immature Granulocytes 0.11 (*)    All other components within normal limits  COMPREHENSIVE METABOLIC PANEL - Abnormal; Notable for the following components:   Glucose, Bld 130 (*)    BUN 27 (*)    Total Bilirubin 1.9 (*)    All other components within normal limits   URINALYSIS, ROUTINE W REFLEX MICROSCOPIC - Abnormal; Notable for the following components:   Color, Urine YELLOW (*)    APPearance CLOUDY (*)    All other components within normal limits  LIPASE, BLOOD  TYPE AND SCREEN  ABO/RH     EKG     RADIOLOGY I independently reviewed and interpreted imaging and agree with radiologists findings.     PROCEDURES:  Critical Care performed:   .Critical Care E&M  Performed by: Marquette Old, PA-C Critical care provider statement:    Critical care time (minutes):  40   Critical care time was exclusive of:  Separately billable procedures and treating other patients   Critical care was necessary to treat or prevent imminent or life-threatening deterioration of the following conditions:  Sepsis and circulatory failure   Critical care was time spent personally by me on the following activities:  Blood draw for specimens, discussions with consultants, development of treatment plan with patient or surrogate, evaluation of patient's response to treatment, examination of patient, obtaining history from patient or surrogate, re-evaluation of patient's condition, review of old charts, ordering and review of laboratory studies, ordering and performing treatments and interventions, ordering and review of radiographic studies and pulse oximetry   I assumed direction of critical care for this patient from another provider in my specialty: no     Care discussed with: admitting provider   After initial E/M assessment, critical care services were subsequently performed that were exclusive of separately billable procedures or treatment.      MEDICATIONS ORDERED IN ED: Medications  piperacillin-tazobactam (ZOSYN) IVPB 3.375 g (has no administration in time range)  pantoprazole (PROTONIX) injection 40 mg (has no administration in time range)  sodium chloride 0.9 % bolus 1,000 mL (has no administration in time range)  iohexol (OMNIPAQUE) 300 MG/ML solution  100 mL (100 mLs Intravenous Contrast Given 11/21/22 1333)     IMPRESSION / MDM / ASSESSMENT AND PLAN / ED COURSE  I reviewed the triage vital signs and the nursing notes.   Differential diagnosis includes, but is not limited to, diverticulitis, appendicitis, gallbladder disease, peptic ulcer disease, duodenal ulcer disease.  I reviewed the patient's chart.  He was seen in the emergency department most recently in August 2023 for chest pain.  Patient is awake and alert, hemodynamically stable and afebrile.  Labs  obtained in triage are revealing for a leukocytosis to 17.  CT scan obtained.  I was called by the radiologist who relayed that the patient has retroperitoneal free air that appears to be coming from a small duodenal perforation.  Patient was immediately moved to a room, and placed on the monitor, started on Zosyn, and I paged Dr. Dahlia Byes who is on-call for general surgery.  Dr. Dahlia Byes recommends two large bore IV, Zosyn, PPI, 2 L of normal saline, and NPO. Dr. Dahlia Byes reports that he will admit primarily.  Patient declines analgesia.  All findings and recommendations were discussed with the patient who agrees with assessment and plan.  Patient's presentation is most consistent with acute presentation with potential threat to life or bodily function.   Clinical Course as of 11/21/22 1438  Wed Nov 21, 2022  1406 Perforated viscous per rads with +free air, Dr. Dahlia Byes paged [JP]    Clinical Course User Index [JP] Philis Doke, Clarnce Flock, PA-C     FINAL CLINICAL IMPRESSION(S) / ED DIAGNOSES   Final diagnoses:  Perforated duodenal ulcer ([place])     Rx / DC Orders   ED Discharge Orders     None        Note:  This document was prepared using Dragon voice recognition software and may include unintentional dictation errors.   Marquette Old, PA-C 11/21/22 1439    Nance Pear, MD 11/21/22 1450

## 2022-11-21 NOTE — H&P (Signed)
[facility] SURGICAL HISTORY & PHYSICAL (cpt [phone])  HISTORY OF PRESENT ILLNESS (HPI):  77 y.o. male presented to [facility] ED today for abdominal pain. Patient reports the acute onset of sharp upper and right sided abdominal pain last night around 9 PM. This has been constant in nature and he has gotten no relief from this. He has not tried to take anything for the pain. No associated fever, chills, CP, SOB, nausea, emesis, urinary changes, nor bowel changes. He notes a history of similar pain in the past but was not diagnosed with any previous ulcers. Has never had EGD. He denied any history of NSAID use, BC powder use, alcohol abuse, or H pylori infection. No previous intra-abdominal surgeries. Work up in the ED revealed a leukocytosis to 17.8K but labs were otherwise reassuring. CT Abdomen/Pelvis was concerning for possible perforated duodenal ulcer.   General surgery is consulted by emergency medicine provider Sheran Luz, PA--C for evaluation and management of perforated duodenal ulcer.   PAST MEDICAL HISTORY (PMH):  Past Medical History:  Diagnosis Date   Anginal pain ([place])    Cancer ([place])    skin - on the nose about 10-15 years ago    Coronary artery disease    COVID-17 Oct 2019   Edema    Hypertension    Myocardial infarction ([place])    Obesity    Varicose veins of both lower extremities     Reviewed. Otherwise negative.   PAST SURGICAL HISTORY [facility]):  Past Surgical History:  Procedure Laterality Date   CARDIAC CATHETERIZATION  2015   stent   CATARACT EXTRACTION W/PHACO Right 11/30/2020   Procedure: CATARACT EXTRACTION PHACO AND INTRAOCULAR LENS PLACEMENT (IOC)  RIGHT 7.70 01:03.4 12.1%;  Surgeon: [name], MD;  Location: [place];  Service: Ophthalmology;  Laterality: Right;  requests early   COLONOSCOPY WITH PROPOFOL N/A 12/12/2015   Procedure: COLONOSCOPY WITH PROPOFOL;  Surgeon: [name], MD;  Location: [facility] ENDOSCOPY;  Service:  Endoscopy;  Laterality: N/A;   KNEE SURGERY     SHOULDER ACROMIOPLASTY     TOTAL HIP ARTHROPLASTY Bilateral    Right 2017, left 2020    Reviewed. Otherwise negative.   MEDICATIONS:  Prior to Admission medications   Medication Sig Start Date End Date Taking? Authorizing Provider  Ascorbic Acid (VITAMIN C) 500 MG CAPS Take by mouth daily.    [provider]  aspirin 81 MG tablet Take 1 tablet (81 mg total) by mouth daily. 07/11/20   Minna Merritts, MD  Black Pepper-Turmeric 3-500 MG CAPS Take by mouth daily.    [provider]  Cholecalciferol (VITAMIN D3) 1.25 MG (50000 UT) TABS Take 100 mcg by mouth daily.    [provider]  COLLAGEN PO Take 1 Scoop by mouth daily.    [provider]  ezetimibe (ZETIA) 10 MG tablet Take 1 tablet (10 mg total) by mouth daily. Patient not taking: Reported on 08/20/2022 01/18/22   Minna Merritts, MD  Ferrous Gluconate (IRON 27 PO) Take by mouth daily. Patient not taking: Reported on 08/20/2022    [provider]  GRAPE SEED CR PO Take by mouth daily.    [provider]  losartan (COZAAR) 100 MG tablet Take 1 tablet (100 mg total) by mouth daily. 01/18/22   Minna Merritts, MD  nitroGLYCERIN (NITROSTAT) 0.4 MG SL tablet Place 0.4 mg under the tongue every 5 (five) minutes as needed for chest pain. Reported on 12/12/2015  [provider]  Omega-3 1000 MG CAPS Take 1,000 mg by mouth daily.    [provider]  QUERCETIN PO Take 500 mg by mouth daily.    [provider]  sildenafil (REVATIO) 20 MG tablet Take 20 mg by mouth daily as needed (ED).     [provider]  vitamin B-12 (CYANOCOBALAMIN) 500 MCG tablet Take 1,000 mcg by mouth daily.    [provider]  zinc sulfate 220 (50 Zn) MG capsule Take 50 mg by mouth daily.    [provider]     ALLERGIES:  No Known Allergies   SOCIAL HISTORY:  Social History   Socioeconomic History   Marital  status: Divorced    Spouse name: Not on file   Number of children: Not on file   Years of education: Not on file   Highest education level: Not on file  Occupational History   Not on file  Tobacco Use   Smoking status: Never   Smokeless tobacco: Never  Vaping Use   Vaping Use: Never used  Substance and Sexual Activity   Alcohol use: Yes    Alcohol/week: 4.0 standard drinks of alcohol    Types: 4 Glasses of wine per week    Comment: 2-3 per weekly   Drug use: No   Sexual activity: Not on file  Other Topics Concern   Not on file  Social History Narrative   Not on file   Social Determinants of Health   Financial Resource Strain: Not on file  Food Insecurity: Not on file  Transportation Needs: Not on file  Physical Activity: Not on file  Stress: Not on file  Social Connections: Not on file  Intimate Partner Violence: Not on file     FAMILY HISTORY:  Family History  Problem Relation Age of Onset   Heart disease Father    Obesity Brother    Obesity Brother     Otherwise negative.   REVIEW OF SYSTEMS:  Review of Systems  Constitutional:  Negative for chills and fever.  HENT:  Negative for congestion and sore throat.   Respiratory:  Negative for cough and shortness of breath.   Cardiovascular:  Negative for chest pain and palpitations.  Gastrointestinal:  Positive for abdominal pain. Negative for constipation, diarrhea, nausea and vomiting.  Genitourinary:  Negative for dysuria and urgency.  All other systems reviewed and are negative.   VITAL SIGNS:  Temp:  [98.3 F (36.8 C)] 98.3 F (36.8 C) (01/24 1152) Pulse Rate:  [89] 89 (01/24 1152) Resp:  [17] 17 (01/24 1152) BP: (139)/(85) 139/85 (01/24 1152) SpO2:  [100 %] 100 % (01/24 1152) Weight:  [104.3 kg] 104.3 kg (01/24 1152)     Height: 6' (182.9 cm) Weight: 104.3 kg BMI (Calculated): 31.19   PHYSICAL EXAM:  Physical Exam Vitals and nursing note reviewed. Exam conducted with a chaperone present.   Constitutional:      General: He is not in acute distress.    Appearance: He is well-developed and normal weight. He is not ill-appearing.  HENT:     Head: Normocephalic and atraumatic.  Eyes:     General: No scleral icterus.    Extraocular Movements: Extraocular movements intact.  Cardiovascular:     Rate and Rhythm: Normal rate and regular rhythm.     Heart sounds: Normal heart sounds. No murmur heard. Pulmonary:     Effort: Pulmonary effort is normal. No respiratory distress.     Breath sounds: Normal breath  sounds.  Abdominal:     General: Abdomen is flat. There is no distension.     Palpations: Abdomen is soft.     Tenderness: There is abdominal tenderness in the epigastric area. There is no guarding or rebound.     Comments: Abdomen is soft, he is tender in upper abdomen, non-distended, no rebound/guarding  Genitourinary:    Comments: Deferred Skin:    General: Skin is warm and dry.     Coloration: Skin is not jaundiced or pale.  Neurological:     General: No focal deficit present.     Mental Status: He is alert and oriented to person, place, and time.  Psychiatric:        Mood and Affect: Mood normal.        Behavior: Behavior normal.     INTAKE/OUTPUT:  This shift: No intake/output data recorded.  Last 2 shifts: '@IOLAST2SHIFTS'$ @  Labs:     Latest Ref Rng & Units 11/21/2022   11:56 AM 06/06/2022    4:19 PM 02/21/2020    9:38 AM  CBC  WBC 4.0 - 10.5 K/uL 17.8  8.0  6.6   Hemoglobin 13.0 - 17.0 g/dL 15.5  14.5  15.5   Hematocrit 39.0 - 52.0 % 44.3  42.3  44.5   Platelets 150 - 400 K/uL 171  175  168       Latest Ref Rng & Units 11/21/2022   11:56 AM 06/06/2022    4:19 PM 02/21/2020    9:38 AM  CMP  Glucose 70 - 99 mg/dL 130  104  108   BUN 8 - 23 mg/dL '27  24  20   '$ Creatinine 0.61 - 1.24 mg/dL 1.17  1.26  1.09   Sodium 135 - 145 mmol/L 137  140  143   Potassium 3.5 - 5.1 mmol/L 4.0  3.9  4.0   Chloride 98 - 111 mmol/L 102  109  108   CO2 22 - 32 mmol/L '25   25  29   '$ Calcium 8.9 - 10.3 mg/dL 9.3  9.1  9.3   Total Protein 6.5 - 8.1 g/dL 6.7     Total Bilirubin 0.3 - 1.2 mg/dL 1.9     Alkaline Phos 38 - 126 U/L 71     AST 15 - 41 U/L 21     ALT 0 - 44 U/L 24        Imaging studies:   CT Abdomen/Pelvis (11/21/2022) personally reviewed concerning for likely perforated duodenal ulcer, pneumoperitoneum noted, and radiologist report reviewed below:  IMPRESSION: 1. Free intraperitoneal air in the upper midabdomen centered around the second portion of the duodenum/mesenteric root with surrounding inflammatory fat stranding and small volume free fluid. A suspected site of perforation is seen in the second portion of the duodenum. Recommend surgical consult. 2. Cholelithiasis without evidence of acute cholecystitis. 3. Diverticulosis without evidence of acute diverticulitis.   Assessment/Plan: (ICD-10's: K81.5) 77 y.o. male with abdominal pain found to have likely perforated duodenal ulcer.    - Admit to general surgery - Plan for exploratory laparotomy this afternoon with Dr Dahlia Byes pending OR/anesthesia availability - All risks, benefits, and alternatives to above procedure(s) were discussed with the patient, all of his questions were answered to his expressed satisfaction, patient expresses he wishes to proceed, and informed consent was obtained.    - NPO; He understands he will be NPO for 3-5 days post-op  - He will need NGT  - IV Abx (Zosyn)  -  IV PPI; 40 mg Pantoprazole BID  - Continue IVF support  - Monitor abdominal examination  - Pain control prn; AVOID NSAIDs  - Antiemetics prn  - Morning labs; CBC, CMP, Electrolytes - Mobilize as tolerated   - DVT prophylaxis; hold for OR  All of the above findings and recommendations were discussed with the patient, and all of his questions were answered to his expressed satisfaction.  -- Edison Simon, PA-C [facility] 11/21/2022, 2:17 PM M-F: 7am - 4pm

## 2022-11-21 NOTE — ED Notes (Signed)
Pt confirmed that he has only had a small sip of water today when he took his BP meds this morning.

## 2022-11-22 ENCOUNTER — Encounter: Payer: Self-pay | Admitting: Surgery

## 2022-11-22 LAB — BPAM RBC
Blood Product Expiration Date: 202402152359
Blood Product Expiration Date: 202402152359
Blood Product Expiration Date: 202402172359
Blood Product Expiration Date: 202402172359
ISSUE DATE / TIME: 202401241727
ISSUE DATE / TIME: 202401241727
Unit Type and Rh: 6200
Unit Type and Rh: 6200
Unit Type and Rh: 6200
Unit Type and Rh: 6200

## 2022-11-22 LAB — COMPREHENSIVE METABOLIC PANEL
ALT: 40 U/L (ref 0–44)
AST: 38 U/L (ref 15–41)
Albumin: 3.3 g/dL — ABNORMAL LOW (ref 3.5–5.0)
Alkaline Phosphatase: 59 U/L (ref 38–126)
Anion gap: 8 (ref 5–15)
BUN: 25 mg/dL — ABNORMAL HIGH (ref 8–23)
CO2: 24 mmol/L (ref 22–32)
Calcium: 8.4 mg/dL — ABNORMAL LOW (ref 8.9–10.3)
Chloride: 105 mmol/L (ref 98–111)
Creatinine, Ser: 1.32 mg/dL — ABNORMAL HIGH (ref 0.61–1.24)
GFR, Estimated: 56 mL/min — ABNORMAL LOW (ref >60)
Glucose, Bld: 163 mg/dL — ABNORMAL HIGH (ref 70–99)
Potassium: 4 mmol/L (ref 3.5–5.1)
Sodium: 137 mmol/L (ref 135–145)
Total Bilirubin: 2.8 mg/dL — ABNORMAL HIGH (ref 0.3–1.2)
Total Protein: 6.1 g/dL — ABNORMAL LOW (ref 6.5–8.1)

## 2022-11-22 LAB — TYPE AND SCREEN
ABO/RH(D): A POS
Antibody Screen: NEGATIVE
Unit division: 0
Unit division: 0
Unit division: 0
Unit division: 0

## 2022-11-22 LAB — MAGNESIUM: Magnesium: 2 mg/dL (ref 1.7–2.4)

## 2022-11-22 LAB — CBC
HCT: 42.6 % (ref 39.0–52.0)
Hemoglobin: 14.8 g/dL (ref 13.0–17.0)
MCH: 32.7 pg (ref 26.0–34.0)
MCHC: 34.7 g/dL (ref 30.0–36.0)
MCV: 94 fL (ref 80.0–100.0)
Platelets: 151 10*3/uL (ref 150–400)
RBC: 4.53 MIL/uL (ref 4.22–5.81)
RDW: 12.5 % (ref 11.5–15.5)
WBC: 15.3 10*3/uL — ABNORMAL HIGH (ref 4.0–10.5)
nRBC: 0 % (ref 0.0–0.2)

## 2022-11-22 LAB — PREPARE RBC (CROSSMATCH)

## 2022-11-22 LAB — PHOSPHORUS: Phosphorus: 3.1 mg/dL (ref 2.5–4.6)

## 2022-11-22 MED ORDER — METHOCARBAMOL 1000 MG/10ML IJ SOLN
500.0000 mg | Freq: Three times a day (TID) | INTRAVENOUS | Status: DC | PRN
  Administered 2022-11-22: 500 mg via INTRAVENOUS
  Filled 2022-11-22: qty 500

## 2022-11-22 MED ORDER — FLUCONAZOLE IN SODIUM CHLORIDE 400-0.9 MG/200ML-% IV SOLN
800.0000 mg | INTRAVENOUS | Status: DC
  Administered 2022-11-22: 400 mg via INTRAVENOUS
  Administered 2022-11-23 – 2022-12-03 (×11): 800 mg via INTRAVENOUS
  Filled 2022-11-22 (×14): qty 400

## 2022-11-22 MED ORDER — OSMOLITE 1.5 CAL PO LIQD
1000.0000 mL | ORAL | Status: DC
  Administered 2022-11-22 – 2022-11-26 (×6): 1000 mL

## 2022-11-22 MED ORDER — OCTREOTIDE ACETATE 100 MCG/ML IJ SOLN
100.0000 ug | Freq: Three times a day (TID) | INTRAMUSCULAR | Status: DC
  Administered 2022-11-22 – 2022-11-28 (×19): 100 ug via SUBCUTANEOUS
  Filled 2022-11-22 (×20): qty 1

## 2022-11-22 MED ORDER — PROSOURCE TF20 ENFIT COMPATIBL EN LIQD
60.0000 mL | Freq: Every day | ENTERAL | Status: DC
  Administered 2022-11-25 – 2022-11-26 (×2): 60 mL
  Filled 2022-11-22 (×5): qty 60

## 2022-11-22 MED ORDER — FREE WATER
30.0000 mL | Status: DC
  Administered 2022-11-22 – 2022-12-04 (×69): 30 mL

## 2022-11-22 MED ORDER — OXYCODONE HCL 5 MG/5ML PO SOLN
5.0000 mg | Freq: Four times a day (QID) | ORAL | Status: DC | PRN
  Administered 2022-11-22: 10 mg
  Filled 2022-11-22: qty 10

## 2022-11-22 NOTE — Progress Notes (Signed)
Pharmacy Antibiotic Note  Ricardo Winters. is a 77 y.o. male w/ PMH of CAD s/p MI, HTN, B12 def, HLD, osteoarthritis admitted on 11/21/2022 with a perforated duodenal ulcer.  Pharmacy has been consulted for fluconazole dosing.  Plan: start fluconazole 800 mg IV every 24 hours --follow renal function for needed dose adjustments  Height: 6' (182.9 cm) Weight: 104.3 kg (230 lb) IBW/kg (Calculated) : 77.6  Temp (24hrs), Avg:98.4 F (36.9 C), Min:97.5 F (36.4 C), Max:99.1 F (37.3 C)  Recent Labs  Lab 11/21/22 1156 11/22/22 0454  WBC 17.8* 15.3*  CREATININE 1.17 1.32*    Estimated Creatinine Clearance: 60.4 mL/min (A) (by C-G formula based on SCr of 1.32 mg/dL (H)).    No Known Allergies  Antimicrobials this admission: 01/24 Zosyn >>  01/25 fluconazole >>   Microbiology results: none   Thank you for allowing pharmacy to be a part of this patient's care.  Dallie Piles 11/22/2022 12:04 PM

## 2022-11-22 NOTE — Progress Notes (Signed)
[place] Day(s): 1.   Post op day(s): 1 Day Post-Op.   Interval History:  Patient seen and examined No acute events or new complaints overnight.  Patient reports he is feeling better at the moment but has intermittent flare up of 10/10 pain No nausea, emesis, fever Leukocytosis improved; 15.3K Hgb stable to 14.8 Renal function just slightly above baseline; sCr - 1.32; UO - 1360 ccs No electrolyte derangements  Does seem to have baseline hyperbilirubinemia but slightly worse this AM to 2.8 NGT in place; output 200 ccs Drains x2 in place  - RUQ: 25 ccs; serosanguinous  - LUQ: 40 ccs; this does appear more bilious She is NPO Does have jejunostomy; plan to initiate TF Continues on Zosyn   Vital signs in last 24 hours: [min-max] current  Temp:  [97.5 F (36.4 C)-99.1 F (37.3 C)] 98.7 F (37.1 C) (01/25 0537) Pulse Rate:  [65-89] 65 (01/24 2045) Resp:  [13-18] 18 (01/25 0537) BP: (128-142)/(74-89) 140/74 (01/25 0537) SpO2:  [94 %-100 %] 99 % (01/25 0537) Weight:  [104.3 kg] 104.3 kg (01/24 1152)     Height: 6' (182.9 cm) Weight: 104.3 kg BMI (Calculated): 31.19   Intake/Output last 2 shifts:  01/24 0701 - 01/25 0700 In: 2962.5 [I.V.:2762.5; IV Piggyback:200] Out: 9924 [Urine:1360; Emesis/NG output:200; Drains:65; Blood:25]   Physical Exam:  Constitutional: alert, cooperative and no distress  HEENT: NGT in place Respiratory: breathing non-labored at rest  Cardiovascular: regular rate and sinus rhythm  Gastrointestinal: Soft, incisional soreness, non-distended, no rebound/guarding. Feeding jejunostomy in left abdomen. RUQ drain; output serosanguinous. LUQ drain; output bilious  Integumentary: Laparotomy is CDI with staples, no erythema   Labs:     Latest Ref Rng & Units 11/22/2022    4:54 AM 11/21/2022   11:56 AM 06/06/2022    4:19 PM  CBC  WBC 4.0 - 10.5 K/uL 15.3  17.8  8.0   Hemoglobin 13.0 - 17.0 g/dL 14.8  15.5   14.5   Hematocrit 39.0 - 52.0 % 42.6  44.3  42.3   Platelets 150 - 400 K/uL 151  171  175       Latest Ref Rng & Units 11/22/2022    4:54 AM 11/21/2022   11:56 AM 06/06/2022    4:19 PM  CMP  Glucose 70 - 99 mg/dL 163  130  104   BUN 8 - 23 mg/dL '25  27  24   '$ Creatinine 0.61 - 1.24 mg/dL 1.32  1.17  1.26   Sodium 135 - 145 mmol/L 137  137  140   Potassium 3.5 - 5.1 mmol/L 4.0  4.0  3.9   Chloride 98 - 111 mmol/L 105  102  109   CO2 22 - 32 mmol/L '24  25  25   '$ Calcium 8.9 - 10.3 mg/dL 8.4  9.3  9.1   Total Protein 6.5 - 8.1 g/dL 6.1  6.7    Total Bilirubin 0.3 - 1.2 mg/dL 2.8  1.9    Alkaline Phos 38 - 126 U/L 59  71    AST 15 - 41 U/L 38  21    ALT 0 - 44 U/L 40  24      Imaging studies: No new pertinent imaging studies   Assessment/Plan: 77 y.o. male 1 Day Post-Op s/p exploratory laparotomy, placement of feeding jejunostomy, and reinforcement of contained perforated duodenal ulcer with purulent fluid in retroperitoneum.   - He will need to be NPO with  NGT decompression until next week prior to reassessing repair/reinforcement and considering any PO intake.   - Okay to initiate TF today via jejunostomy; will discuss with RD; NO PILLS via jejunostomy   - Continue IV Abx (Zosyn)  - Continue IV PPI BID  - Continue IVF resuscitation; monitor renal function  - Continue foley catheter today; may DC this tomorrow (01/26) pending clinical condition  - Continue surgical drains x2; monitor and record output   - Monitor abdominal examination; on-going bowel function - Pain control prn; IV Tylenol, Morphine, Robaxin. We are limited secondary to need to be NPO for extended time.   - Antiemetics prn    - Monitor leukocytosis; improving   - Okay to mobilize when feasible  All of the above findings and recommendations were discussed with the patient, and the medical team, and all of patient's questions were answered to his expressed satisfaction.  -- Edison Simon, PA-C [facility] 11/22/2022, 7:14 AM M-F: 7am - 4pm

## 2022-11-22 NOTE — Progress Notes (Signed)
Initial Nutrition Assessment  DOCUMENTATION CODES:   Obesity unspecified  INTERVENTION:   Osmolite 1.5'@70ml'$ /hr- Initiate at 40m/hr and advance by 112mhr q 8 hours until goal rate is reached.   ProSource TF 20- Give 6067maily via tube, each supplement provides 80kcal and 20g of protein.   Free water flushes 42m24m hours to maintain tube patency   Regimen provides 2600kcal/day, 125g/day protein and 1460ml85m of free water.   Pt at high refeed risk; recommend monitor potassium, magnesium and phosphorus labs daily until stable  Daily weights   NUTRITION DIAGNOSIS:   Inadequate oral intake related to altered GI function as evidenced by NPO status.  GOAL:   Patient will meet greater than or equal to 90% of their needs  MONITOR:   Diet advancement, Labs, Weight trends, TF tolerance, I & O's, Skin  REASON FOR ASSESSMENT:   Consult Enteral/tube feeding initiation and management  ASSESSMENT:   76 y/57male with h/o CAD, HTN, DVT, HLD and MI who is admitted with perforated duodenal ulcer now s/p exploratory laparotomy 1/24 (with placement of feeding jejunostomy, and reinforcement of contained perforated duodenal ulcer).  Met with pt in room today. Pt reports that he is feeling ok. Pt reports good appetite and oral intake at baseline. Pt denies any recent weight loss or weight changes. Pt with NGT in place to LIS with 300ml 59mut. Pt denies any nausea. Pt with J-tube in place; plan is to initiate tube feeds today. Will plan to start at trickle rate of 20ml/h56md advance as tolerated. Pt is at refeed risk. RD discussed with pt the importance of adequate nutrition needed to support post op healing and to preserve lean muscle. Pt is willing to drink Ensure with diet advancement.    Medications reviewed and include: octreotide, protonix, NaCl '@125ml'$ /hr, zosyn   Labs reviewed: K 4.0 wnl, BUN 25(H), creat 1.32(H), P 3.1 wnl, Mg 2.0 wnl Wbc- 15.3(H)  Drains: - RUQ: 25 ccs;  serosanguinous - LUQ: 40 ccs; this does appear more bilious  NUTRITION - FOCUSED PHYSICAL EXAM:  Flowsheet Row Most Recent Value  Orbital Region No depletion  Upper Arm Region Moderate depletion  Thoracic and Lumbar Region No depletion  Buccal Region No depletion  Temple Region Mild depletion  Clavicle Bone Region Moderate depletion  Clavicle and Acromion Bone Region Moderate depletion  Scapular Bone Region No depletion  Dorsal Hand No depletion  Patellar Region No depletion  Anterior Thigh Region No depletion  Posterior Calf Region No depletion  Edema (RD Assessment) None  Hair Reviewed  Eyes Reviewed  Mouth Reviewed  Skin Reviewed  Nails Reviewed   Diet Order:   Diet Order             Diet NPO time specified  Diet effective now                  EDUCATION NEEDS:   Education needs have been addressed  Skin:  Skin Assessment: Reviewed RN Assessment (incision abdomen)  Last BM:  1/22  Height:   Ht Readings from Last 1 Encounters:  11/21/22 6' (1.829 m)    Weight:   Wt Readings from Last 1 Encounters:  11/21/22 104.3 kg    Ideal Body Weight:  80.9 kg  BMI:  Body mass index is 31.19 kg/m.  Estimated Nutritional Needs:   Kcal:  2400-2700kcal/day  Protein:  120-135g/day  Fluid:  2.1-2.4L/day  Ricardo Winters CKoleen Winters, LDN Please refer to [facility] and/or RD on-call/weekend/after hours  pager

## 2022-11-22 NOTE — Anesthesia Postprocedure Evaluation (Signed)
Anesthesia Post Note  Patient: Ricardo Winters.  Procedure(s) Performed: EXPLORATORY LAPAROTOMY  Patient location during evaluation: PACU Anesthesia Type: General Level of consciousness: awake and alert Pain management: pain level controlled Vital Signs Assessment: post-procedure vital signs reviewed and stable Respiratory status: spontaneous breathing, nonlabored ventilation and respiratory function stable Cardiovascular status: blood pressure returned to baseline and stable Postop Assessment: no apparent nausea or vomiting Anesthetic complications: no   No notable events documented.   Last Vitals:  Vitals:   11/21/22 2045 11/22/22 0537  BP: 128/75 (!) 140/74  Pulse: 65   Resp: 14 18  Temp: (!) 36.4 C 37.1 C  SpO2: 94% 99%    Last Pain:  Vitals:   11/22/22 0537  TempSrc: Oral  PainSc: [place]

## 2022-11-22 NOTE — Care Management Important Message (Signed)
Important Message  Patient Details  Name: Ricardo Winters. MRN: [id number] Date of Birth: July 28, 1946   Medicare Important Message Given:  N/A - LOS <3 / Initial given by admissions     Dannette Barbara 11/22/2022, 3:13 PM

## 2022-11-22 NOTE — Plan of Care (Signed)
  Problem: Education: Goal: Knowledge of General Education information will improve Description: Including pain rating scale, medication(s)/side effects and non-pharmacologic comfort measures 11/22/2022 0641 by Mordecai Rasmussen, RN Outcome: Progressing 11/21/2022 2239 by Mordecai Rasmussen, RN Outcome: Progressing   Problem: Health Behavior/Discharge Planning: Goal: Ability to manage health-related needs will improve 11/22/2022 0641 by Mordecai Rasmussen, RN Outcome: Progressing 11/21/2022 2239 by Mordecai Rasmussen, RN Outcome: Progressing   Problem: Clinical Measurements: Goal: Ability to maintain clinical measurements within normal limits will improve 11/22/2022 0641 by Mordecai Rasmussen, RN Outcome: Progressing 11/21/2022 2239 by Mordecai Rasmussen, RN Outcome: Progressing Goal: Will remain free from infection 11/22/2022 0641 by Mordecai Rasmussen, RN Outcome: Progressing 11/21/2022 2239 by Mordecai Rasmussen, RN Outcome: Progressing Goal: Diagnostic test results will improve Outcome: Progressing Goal: Respiratory complications will improve Outcome: Progressing Goal: Cardiovascular complication will be avoided Outcome: Progressing   Problem: Activity: Goal: Risk for activity intolerance will decrease Outcome: Progressing   Problem: Nutrition: Goal: Adequate nutrition will be maintained Outcome: Progressing   Problem: Coping: Goal: Level of anxiety will decrease Outcome: Progressing   Problem: Elimination: Goal: Will not experience complications related to bowel motility Outcome: Progressing Goal: Will not experience complications related to urinary retention Outcome: Progressing   Problem: Pain Managment: Goal: General experience of comfort will improve Outcome: Progressing   Problem: Safety: Goal: Ability to remain free from injury will improve Outcome: Progressing   Problem: Skin Integrity: Goal: Risk for impaired skin integrity will decrease Outcome:  Progressing

## 2022-11-23 LAB — PHOSPHORUS: Phosphorus: 2.6 mg/dL (ref 2.5–4.6)

## 2022-11-23 LAB — COMPREHENSIVE METABOLIC PANEL
ALT: 30 U/L (ref 0–44)
AST: 24 U/L (ref 15–41)
Albumin: 2.9 g/dL — ABNORMAL LOW (ref 3.5–5.0)
Alkaline Phosphatase: 53 U/L (ref 38–126)
Anion gap: 5 (ref 5–15)
BUN: 23 mg/dL (ref 8–23)
CO2: 26 mmol/L (ref 22–32)
Calcium: 7.9 mg/dL — ABNORMAL LOW (ref 8.9–10.3)
Chloride: 107 mmol/L (ref 98–111)
Creatinine, Ser: 1.17 mg/dL (ref 0.61–1.24)
GFR, Estimated: >60 mL/min (ref >60)
Glucose, Bld: 179 mg/dL — ABNORMAL HIGH (ref 70–99)
Potassium: 4.1 mmol/L (ref 3.5–5.1)
Sodium: 138 mmol/L (ref 135–145)
Total Bilirubin: 2.1 mg/dL — ABNORMAL HIGH (ref 0.3–1.2)
Total Protein: 5.6 g/dL — ABNORMAL LOW (ref 6.5–8.1)

## 2022-11-23 LAB — CBC
HCT: 40.8 % (ref 39.0–52.0)
Hemoglobin: 14.1 g/dL (ref 13.0–17.0)
MCH: 33.1 pg (ref 26.0–34.0)
MCHC: 34.6 g/dL (ref 30.0–36.0)
MCV: 95.8 fL (ref 80.0–100.0)
Platelets: 139 10*3/uL — ABNORMAL LOW (ref 150–400)
RBC: 4.26 MIL/uL (ref 4.22–5.81)
RDW: 12.7 % (ref 11.5–15.5)
WBC: 13.6 10*3/uL — ABNORMAL HIGH (ref 4.0–10.5)
nRBC: 0 % (ref 0.0–0.2)

## 2022-11-23 LAB — MAGNESIUM: Magnesium: 2.3 mg/dL (ref 1.7–2.4)

## 2022-11-23 MED ORDER — HYDRALAZINE HCL 20 MG/ML IJ SOLN
5.0000 mg | Freq: Four times a day (QID) | INTRAMUSCULAR | Status: DC | PRN
  Administered 2022-11-24 – 2022-11-25 (×5): 5 mg via INTRAVENOUS
  Filled 2022-11-23 (×4): qty 1

## 2022-11-23 MED ORDER — MENTHOL 3 MG MT LOZG
1.0000 | LOZENGE | OROMUCOSAL | Status: DC | PRN
  Administered 2022-11-26: 3 mg via ORAL
  Filled 2022-11-23: qty 9

## 2022-11-23 MED ORDER — FENTANYL 50 MCG/HR TD PT72
1.0000 | MEDICATED_PATCH | TRANSDERMAL | Status: DC
  Administered 2022-11-23: 1 via TRANSDERMAL
  Filled 2022-11-23: qty 1

## 2022-11-23 MED ORDER — CHLORHEXIDINE GLUCONATE CLOTH 2 % EX PADS: 6.0000 | MEDICATED_PAD | Freq: Every day | CUTANEOUS | Status: DC

## 2022-11-23 MED ORDER — LIDOCAINE 5 % EX PTCH
2.0000 | MEDICATED_PATCH | CUTANEOUS | Status: DC
  Administered 2022-11-23 – 2022-11-25 (×3): 2 via TRANSDERMAL
  Filled 2022-11-23 (×8): qty 2

## 2022-11-23 MED ORDER — PHENOL 1.4 % MT LIQD
1.0000 | OROMUCOSAL | Status: DC | PRN
  Administered 2022-11-26: 1 via OROMUCOSAL
  Filled 2022-11-23 (×2): qty 177

## 2022-11-23 MED ORDER — ACETAMINOPHEN 10 MG/ML IV SOLN
1000.0000 mg | Freq: Four times a day (QID) | INTRAVENOUS | Status: AC
  Administered 2022-11-23 – 2022-11-24 (×4): 1000 mg via INTRAVENOUS
  Filled 2022-11-23 (×4): qty 100

## 2022-11-23 NOTE — TOC CM/SW Note (Signed)
  Transition of Care [facility]) Screening Note   Patient Details  Name: Ricardo Winters. Date of Birth: 03-22-1946   Transition [facility]) CM/SW Contact:    Candie Chroman, LCSW Phone Number: 11/23/2022, 1:49 PM    Transition of Care Department [facility]) has reviewed patient and no TOC needs have been identified at this time. We will continue to monitor patient advancement through interdisciplinary progression rounds. If new patient transition needs arise, please place a TOC consult.

## 2022-11-23 NOTE — Evaluation (Signed)
Physical Therapy Evaluation Patient Details Name: Ricardo Winters. MRN: [id number] DOB: Mar 01, 1946 Today's Date: 11/23/2022  History of Present Illness  Pt is a 77 y/o M admitted on 11/21/22 after presenting with RLQ pain since the day prior. CT Abdomen/Pelvis was concerning for possible perforated duodenal ulcer & general sx was consulted. Pt underwent exploratory laparotomy, placement of feeding jejunostomy, & reinforcement of contained perforated duodenal ulcer with purulent fluid in retroperitoneum on 11/21/22. PMH: lumbar spondylosis, chronic pain syndrome, lumbar radiculopathy, chronic SI joint pain, syncope, HTN, CAD, HLD, MI  Clinical Impression  Pt seen for PT evaluation with pt agreeable to tx. Pt reports prior to admission she was independent without AD, driving, and working part time. Pt educated on importance of early mobility & purpose of PT at this time. Pt is able to complete bed mobility with cuing for log rolling with use of hospital bed features. Pt completes STS with CGA & step pivot bed>recliner with RW. Pt does endorse slight lightheadedness upon standing but reports feeling better in sitting. Encouraged pt to sit in recliner as long as possible. Will continue to follow pt acutely to progress gait with LRAD, strengthening, balance, & activity tolerance.   Recommendations for follow up therapy are one component of a multi-disciplinary discharge planning process, led by the attending physician.  Recommendations may be updated based on patient status, additional functional criteria and insurance authorization.  Follow Up Recommendations Home health PT      Assistance Recommended at Discharge Intermittent Supervision/Assistance  Patient can return home with the following  A little help with walking and/or transfers;A little help with bathing/dressing/bathroom;Assistance with cooking/housework;Assist for transportation;Help with stairs or ramp for entrance    Equipment  Recommendations None recommended by PT (pt reports he already has a RW)  Recommendations for Other Services       Functional Status Assessment Patient has had a recent decline in their functional status and demonstrates the ability to make significant improvements in function in a reasonable and predictable amount of time.     Precautions / Restrictions Precautions Precautions: Fall Precaution Comments: log roll for abdominal comfort Restrictions Weight Bearing Restrictions: No      Mobility  Bed Mobility Overal bed mobility: Needs Assistance Bed Mobility: Rolling, Sidelying to Sit Rolling: Min assist (HOB slightly elevated, use of bed rails) Sidelying to sit: Min guard, HOB elevated       General bed mobility comments: cuing/education re: log rolling technique    Transfers Overall transfer level: Needs assistance Equipment used: Rolling walker (2 wheels) Transfers: Sit to/from Stand, Bed to chair/wheelchair/BSC Sit to Stand: Min guard   Step pivot transfers: Min guard       General transfer comment: Cuing re: hand placement for STS transfer    Ambulation/Gait                  Stairs            Wheelchair Mobility    Modified Rankin (Stroke Patients Only)       Balance Overall balance assessment: Needs assistance Sitting-balance support: Feet supported Sitting balance-Leahy Scale: Fair Sitting balance - Comments: supervision static sitting EOB   Standing balance support: Bilateral upper extremity supported, During functional activity, Reliant on assistive device for balance Standing balance-Leahy Scale: Fair                               Pertinent Vitals/Pain Pain Assessment Pain  Assessment: Faces Faces Pain Scale: Hurts even more Pain Location: abdomen Pain Descriptors / Indicators: Discomfort, Grimacing, Guarding Pain Intervention(s): Monitored during session, Repositioned    Home Living Family/patient expects to be  discharged to:: Private residence Living Arrangements: Spouse/significant other (girlfriend) Available Help at Discharge: Family Type of Home: House Home Access: Stairs to enter Entrance Stairs-Rails: None Entrance Stairs-Number of Steps: 2   Home Layout: Two level;Able to live on main level with bedroom/bathroom Home Equipment: Rolling Walker (2 wheels)      Prior Function Prior Level of Function : Independent/Modified Independent;Working/employed;Driving             Mobility Comments: Independent without AD, driving, working part time (semi retired)       Journalist, newspaper        Extremity/Trunk Assessment   Upper Extremity Assessment Upper Extremity Assessment: Overall WFL for tasks assessed    Lower Extremity Assessment Lower Extremity Assessment: Generalized weakness    Cervical / Trunk Assessment Cervical / Trunk Assessment:  (multiple abdominal drains)  Communication   Communication: No difficulties  Cognition Arousal/Alertness: Awake/alert Behavior During Therapy: WFL for tasks assessed/performed Overall Cognitive Status: Within Functional Limits for tasks assessed                                          General Comments      Exercises     Assessment/Plan    PT Assessment Patient needs continued PT services  PT Problem List Pain;Decreased mobility;Decreased knowledge of precautions;Decreased balance;Decreased safety awareness;Decreased activity tolerance;Decreased knowledge of use of DME;Decreased strength;Decreased coordination;Decreased range of motion;Decreased skin integrity       PT Treatment Interventions Therapeutic exercise;DME instruction;Gait training;Balance training;Stair training;Neuromuscular re-education;Functional mobility training;Therapeutic activities;Patient/family education    PT Goals (Current goals can be found in the Care Plan section)  Acute Rehab PT Goals Patient Stated Goal: decreased pain PT Goal  Formulation: With patient Time For Goal Achievement: 12/07/22 Potential to Achieve Goals: Good    Frequency Min 2X/week     Co-evaluation               AM-PAC PT "6 Clicks" Mobility  Outcome Measure Help needed turning from your back to your side while in a flat bed without using bedrails?: A Little Help needed moving from lying on your back to sitting on the side of a flat bed without using bedrails?: A Little Help needed moving to and from a bed to a chair (including a wheelchair)?: A Little Help needed standing up from a chair using your arms (e.g., wheelchair or bedside chair)?: A Little Help needed to walk in hospital room?: A Little Help needed climbing 3-5 steps with a railing? : A Little 6 Click Score: 18    End of Session   Activity Tolerance: Patient tolerated treatment well;Patient limited by pain Patient left: in chair;with call bell/phone within reach;with nursing/sitter in room Nurse Communication: Mobility status PT Visit Diagnosis: Muscle weakness (generalized) (M62.81);Difficulty in walking, not elsewhere classified (R26.2);Pain Pain - part of body:  (abdomen)    Time: 9024-0973 PT Time Calculation (min) (ACUTE ONLY): 15 min   Charges:   PT Evaluation $PT Eval Moderate Complexity: [place], PT, DPT 11/23/22, 2:20 PM   Waunita Schooner 11/23/2022, 2:16 PM

## 2022-11-23 NOTE — Progress Notes (Signed)
Pharmacy Antibiotic Note  Ricardo Winters. is a 77 y.o. male w/ PMH of CAD s/p MI, HTN, B12 def, HLD, osteoarthritis admitted on 11/21/2022 with a perforated duodenal ulcer.  Pharmacy has been consulted for fluconazole dosing.  -also on Zosyn -strict NPO  Plan:  - continue fluconazole 800 mg IV every 24 hours  --follow renal function for needed dose adjustments  Height: 6' (182.9 cm) Weight: 104.3 kg (230 lb) IBW/kg (Calculated) : 77.6  Temp (24hrs), Avg:98.3 F (36.8 C), Min:98 F (36.7 C), Max:98.6 F (37 C)  Recent Labs  Lab 11/21/22 1156 11/22/22 0454 11/23/22 0209  WBC 17.8* 15.3* 13.6*  CREATININE 1.17 1.32* 1.17     Estimated Creatinine Clearance: 68.1 mL/min (by C-G formula based on SCr of 1.17 mg/dL).    No Known Allergies  Antimicrobials this admission: 01/24 Zosyn >>  01/25 fluconazole >>   Microbiology results: none   Thank you for allowing pharmacy to be a part of this patient's care.  Shernita Rabinovich A 11/23/2022 11:47 AM

## 2022-11-23 NOTE — Progress Notes (Signed)
Attmempted to get Ricardo Winters out of bed to chair per order.  Mr. Henkels sat on edge of bed and unable to stand due to abdominal pain.  Did not attempt to stand due to safety. Repositioned back in bed and notified Z. Olean Ree PA of need for PT/OT to assess/treat patient.

## 2022-11-23 NOTE — Progress Notes (Signed)
[place] Hospital Day(s): 2.   Post op day(s): 2 Days Post-Op.   Interval History:  Patient seen and examined No acute events or new complaints overnight.  Patient reports he is doing okay; biggest complaint is sore throat and congestion from NGT Abdominal soreness; worse when trying to move around  No nausea, emesis, fever Leukocytosis improved; 13.6K Hgb stable to 14.1 Renal function normalized; sCr - 1.17; UO - 950 ccs No electrolyte derangements  Does seem to have baseline hyperbilirubinemia; improved today; 2.1 NGT in place; output 200 ccs Drains x2 in place  - RUQ: 55 ccs; serosanguinous  - LUQ: 135 ccs; this is clearing; less bilious appearance this morning She is NPO Does have jejunostomy; on TF at 40 ml/hr Continues on Zosyn   Vital signs in last 24 hours: [min-max] current  Temp:  [98 F (36.7 C)-98.6 F (37 C)] 98.5 F (36.9 C) (01/26 0351) Pulse Rate:  [66-72] 67 (01/26 0351) Resp:  [16-18] 18 (01/26 0351) BP: (127-167)/(77-90) 151/90 (01/26 0351) SpO2:  [93 %-99 %] 97 % (01/26 0351)     Height: 6' (182.9 cm) Weight: 104.3 kg BMI (Calculated): 31.19   Intake/Output last 2 shifts:  01/25 0701 - 01/26 0700 In: 10  Out: 1140 [Urine:950; Drains:190]   Physical Exam:  Constitutional: alert, cooperative and no distress  HEENT: NGT in place Respiratory: breathing non-labored at rest  Cardiovascular: regular rate and sinus rhythm  Gastrointestinal: Soft, incisional soreness, non-distended, no rebound/guarding. Feeding jejunostomy in left abdomen. RUQ drain; output serosanguinous. LUQ drain; output bilious but clearing some   Integumentary: Laparotomy is CDI with staples, no erythema   Labs:     Latest Ref Rng & Units 11/23/2022    2:09 AM 11/22/2022    4:54 AM 11/21/2022   11:56 AM  CBC  WBC 4.0 - 10.5 K/uL 13.6  15.3  17.8   Hemoglobin 13.0 - 17.0 g/dL 14.1  14.8  15.5   Hematocrit 39.0 - 52.0 % 40.8  42.6  44.3    Platelets 150 - 400 K/uL 139  151  171       Latest Ref Rng & Units 11/23/2022    2:09 AM 11/22/2022    4:54 AM 11/21/2022   11:56 AM  CMP  Glucose 70 - 99 mg/dL 179  163  130   BUN 8 - 23 mg/dL '23  25  27   '$ Creatinine 0.61 - 1.24 mg/dL 1.17  1.32  1.17   Sodium 135 - 145 mmol/L 138  137  137   Potassium 3.5 - 5.1 mmol/L 4.1  4.0  4.0   Chloride 98 - 111 mmol/L 107  105  102   CO2 22 - 32 mmol/L '26  24  25   '$ Calcium 8.9 - 10.3 mg/dL 7.9  8.4  9.3   Total Protein 6.5 - 8.1 g/dL 5.6  6.1  6.7   Total Bilirubin 0.3 - 1.2 mg/dL 2.1  2.8  1.9   Alkaline Phos 38 - 126 U/L 53  59  71   AST 15 - 41 U/L 24  38  21   ALT 0 - 44 U/L 30  40  24     Imaging studies: No new pertinent imaging studies   Assessment/Plan: 77 y.o. male 2 Days Post-Op s/p exploratory laparotomy, placement of feeding jejunostomy, and reinforcement of contained perforated duodenal ulcer with purulent fluid in retroperitoneum.   - He will need to be NPO with  NGT decompression until next week prior to reassessing repair/reinforcement and considering any PO intake.   - Okay to continue TF via jejunostomy; NO PILLS via jejunostomy   - Continue IV Abx (Zosyn)  - Continue IV PPI BID  - Continue IVF resuscitation; monitor renal function  - Discontinue foley catheter today  - Continue surgical drains x2; monitor and record output   - Monitor abdominal examination; on-going bowel function - Pain control prn; IV Tylenol, Morphine, Robaxin. We are limited secondary to need to be NPO for extended time.   - Antiemetics prn    - Monitor leukocytosis; improving  - Okay to mobilize when feasible  All of the above findings and recommendations were discussed with the patient, and the medical team, and all of patient's questions were answered to his expressed satisfaction.  -- Edison Simon, PA-C [facility] 11/23/2022, 7:13 AM M-F: 7am - 4pm

## 2022-11-23 NOTE — Progress Notes (Signed)
Foley removed per order.  Educated patient regarding need to void within 6 hours. Provided patient with urinal.  Discussed with patient PA Olean Ree order for OOB to chair, educated that mobility is important for healing. Made plan with NT Caryl Pina to get out of bed later this AM.

## 2022-11-24 LAB — BASIC METABOLIC PANEL
Anion gap: 5 (ref 5–15)
BUN: 17 mg/dL (ref 8–23)
CO2: 29 mmol/L (ref 22–32)
Calcium: 7.9 mg/dL — ABNORMAL LOW (ref 8.9–10.3)
Chloride: 105 mmol/L (ref 98–111)
Creatinine, Ser: 1.02 mg/dL (ref 0.61–1.24)
GFR, Estimated: >60 mL/min (ref >60)
Glucose, Bld: 116 mg/dL — ABNORMAL HIGH (ref 70–99)
Potassium: 3.6 mmol/L (ref 3.5–5.1)
Sodium: 139 mmol/L (ref 135–145)

## 2022-11-24 LAB — CBC
HCT: 40.3 % (ref 39.0–52.0)
Hemoglobin: 13.7 g/dL (ref 13.0–17.0)
MCH: 32.9 pg (ref 26.0–34.0)
MCHC: 34 g/dL (ref 30.0–36.0)
MCV: 96.6 fL (ref 80.0–100.0)
Platelets: 140 10*3/uL — ABNORMAL LOW (ref 150–400)
RBC: 4.17 MIL/uL — ABNORMAL LOW (ref 4.22–5.81)
RDW: 12.7 % (ref 11.5–15.5)
WBC: 11.4 10*3/uL — ABNORMAL HIGH (ref 4.0–10.5)
nRBC: 0 % (ref 0.0–0.2)

## 2022-11-24 LAB — GLUCOSE, CAPILLARY: Glucose-Capillary: 144 mg/dL — ABNORMAL HIGH (ref 70–99)

## 2022-11-24 LAB — MAGNESIUM: Magnesium: 2.2 mg/dL (ref 1.7–2.4)

## 2022-11-24 LAB — PHOSPHORUS: Phosphorus: 2.1 mg/dL — ABNORMAL LOW (ref 2.5–4.6)

## 2022-11-24 MED ORDER — MORPHINE SULFATE (PF) 2 MG/ML IV SOLN: 1.0000 mg | INTRAVENOUS | Status: DC | PRN

## 2022-11-24 MED ORDER — ENOXAPARIN SODIUM 60 MG/0.6ML IJ SOSY
0.5000 mg/kg | PREFILLED_SYRINGE | INTRAMUSCULAR | Status: DC
  Administered 2022-11-24 – 2022-12-03 (×10): 55 mg via SUBCUTANEOUS
  Filled 2022-11-24 (×10): qty 0.6

## 2022-11-24 NOTE — Progress Notes (Signed)
   11/24/22 0832  Oxygen Therapy  O2 Device Nasal Cannula  O2 Flow Rate (L/min) 2 L/min   Placed on 2L [place] at this time due to patient request. He states he is having some anxiety and that the oxygen may help with his feelings of anxiety.

## 2022-11-24 NOTE — Progress Notes (Signed)
M  11/24/22 0044  Mobility  Activity Ambulated with assistance in room;Stood at bedside;Dangled on edge of bed  Level of Assistance Contact guard assist, steadying assist  Assistive Device Front wheel walker  Distance Ambulated (ft) 5 ft  Activity Response Tolerated well  Mobility Referral Yes  $Mobility charge 1 Mobility   PT semi-supine in bed on RA upon arrival. PT completes bed mobility indep. PT able to maintain sitting balance EOB for 1~2 minutes. PT STS and ambulates from bed to recliner CGA. Pt left in recliner with needs in reach.   Gretchen Short  Mobility Specialist  11/24/22 9:25 AM

## 2022-11-24 NOTE — Progress Notes (Signed)
Subjective:  CC: Ricardo Winters. is a 77 y.o. male  Hospital stay day 3, 3 Days Post-Op s/p exploratory laparotomy, placement of feeding jejunostomy, and reinforcement of contained perforated duodenal ulcer with purulent fluid in retroperitoneum.   HPI: No acute issues reported overnight.  ROS:  General: Denies weight loss, weight gain, fatigue, fevers, chills, and night sweats. Heart: Denies chest pain, palpitations, racing heart, irregular heartbeat, leg pain or swelling, and decreased activity tolerance. Respiratory: Denies breathing difficulty, shortness of breath, wheezing, cough, and sputum. GI: Denies change in appetite, heartburn, nausea, vomiting, constipation, diarrhea, and blood in stool. GU: Denies difficulty urinating, pain with urinating, urgency, frequency, blood in urine.   Objective:   Temp:  [98 F (36.7 C)-98.6 F (37 C)] 98 F (36.7 C) (01/27 0839) Pulse Rate:  [62-71] 64 (01/27 1114) Resp:  [16-18] 18 (01/27 1114) BP: (135-193)/(80-96) 183/90 (01/27 1114) SpO2:  [90 %-99 %] 99 % (01/27 1114) Weight:  [110.7 kg] 110.7 kg (01/27 0600)     Height: 6' (182.9 cm) Weight: 110.7 kg BMI (Calculated): 33.09   Intake/Output this shift:   Intake/Output Summary (Last 24 hours) at 11/24/2022 1321 Last data filed at 11/24/2022 1253 Gross per 24 hour  Intake 3470.88 ml  Output 3030 ml  Net 440.88 ml    Constitutional :  alert, cooperative, appears stated age, and no distress  Respiratory:  clear to auscultation bilaterally  Cardiovascular:  regular rate and rhythm  Gastrointestinal: Soft, no guarding, tenderness to palpation along incision sites as expected.  NG with bilious output.  JP drains with serous output.  Jejunostomy tube in place.  Tube feeds at goal rate. .   Skin: Cool and moist.  Staple line dressing clean dry and intact  Psychiatric: Normal affect, non-agitated, not confused       LABS:     Latest Ref Rng & Units 11/24/2022    5:28 AM 11/23/2022     2:09 AM 11/22/2022    4:54 AM  CMP  Glucose 70 - 99 mg/dL 116  179  163   BUN 8 - 23 mg/dL '17  23  25   '$ Creatinine 0.61 - 1.24 mg/dL 1.02  1.17  1.32   Sodium 135 - 145 mmol/L 139  138  137   Potassium 3.5 - 5.1 mmol/L 3.6  4.1  4.0   Chloride 98 - 111 mmol/L 105  107  105   CO2 22 - 32 mmol/L '29  26  24   '$ Calcium 8.9 - 10.3 mg/dL 7.9  7.9  8.4   Total Protein 6.5 - 8.1 g/dL  5.6  6.1   Total Bilirubin 0.3 - 1.2 mg/dL  2.1  2.8   Alkaline Phos 38 - 126 U/L  53  59   AST 15 - 41 U/L  24  38   ALT 0 - 44 U/L  30  40       Latest Ref Rng & Units 11/24/2022    5:28 AM 11/23/2022    2:09 AM 11/22/2022    4:54 AM  CBC  WBC 4.0 - 10.5 K/uL 11.4  13.6  15.3   Hemoglobin 13.0 - 17.0 g/dL 13.7  14.1  14.8   Hematocrit 39.0 - 52.0 % 40.3  40.8  42.6   Platelets 150 - 400 K/uL 140  139  151     RADS: N/a Assessment:   s/p exploratory laparotomy, placement of feeding jejunostomy, and reinforcement of contained perforated duodenal ulcer with purulent  fluid in retroperitoneum.   Stable for now.  Will start weaning pain medications by adjusting dosage per patient request.  Continue current management with tube feeds.  Hydralazine as needed for hypertension.  labs/images/medications/previous chart entries reviewed personally and relevant changes/updates noted above.

## 2022-11-24 NOTE — Progress Notes (Signed)
OT Cancellation Note  Patient Details Name: Ricardo Winters. MRN: [id number] DOB: 18-Feb-1946   Cancelled Treatment:    Reason Eval/Treat Not Completed: Other (comment) Upon arrival pt in bed - report his been up 2 x this am already. Had bilateral THR in past. His significant other is going to take of first week that pt is going home. Can stay on one level and has walk in shower.  Can transfer himself and do LB dressing with no issues. He drove prior to to this and work still part time,  Rosalyn Gess OTR/L,CLT 11/24/2022, 11:52 AM

## 2022-11-24 NOTE — Progress Notes (Signed)
Mobility Specialist - Progress Note   11/24/22 1048  Mobility  Activity Ambulated with assistance in room  Level of Assistance Standby assist, set-up cues, supervision of patient - no hands on  Assistive Device Front wheel walker  Distance Ambulated (ft) 5 ft  Activity Response Tolerated well  Mobility Referral Yes  $Mobility charge 1 Mobility   Pt sitting in recliner on 2L upon arrival. Pt STS and ambulates from recliner to bed SBA with no LOB noted. Pt left in bed with needs in reach.   Gretchen Short  Mobility Specialist  11/24/22 10:50 AM

## 2022-11-25 DIAGNOSIS — I160 Hypertensive urgency: Secondary | ICD-10-CM | POA: Diagnosis present

## 2022-11-25 DIAGNOSIS — I10 Essential (primary) hypertension: Secondary | ICD-10-CM

## 2022-11-25 DIAGNOSIS — I5022 Chronic systolic (congestive) heart failure: Secondary | ICD-10-CM | POA: Diagnosis not present

## 2022-11-25 DIAGNOSIS — K265 Chronic or unspecified duodenal ulcer with perforation: Secondary | ICD-10-CM | POA: Diagnosis not present

## 2022-11-25 LAB — MAGNESIUM: Magnesium: 2 mg/dL (ref 1.7–2.4)

## 2022-11-25 LAB — BASIC METABOLIC PANEL
Anion gap: 3 — ABNORMAL LOW (ref 5–15)
BUN: 14 mg/dL (ref 8–23)
CO2: 30 mmol/L (ref 22–32)
Calcium: 8.2 mg/dL — ABNORMAL LOW (ref 8.9–10.3)
Chloride: 99 mmol/L (ref 98–111)
Creatinine, Ser: 0.8 mg/dL (ref 0.61–1.24)
GFR, Estimated: >60 mL/min (ref >60)
Glucose, Bld: 149 mg/dL — ABNORMAL HIGH (ref 70–99)
Potassium: 3.5 mmol/L (ref 3.5–5.1)
Sodium: 132 mmol/L — ABNORMAL LOW (ref 135–145)

## 2022-11-25 LAB — CBC
HCT: 43.3 % (ref 39.0–52.0)
Hemoglobin: 14.9 g/dL (ref 13.0–17.0)
MCH: 32.6 pg (ref 26.0–34.0)
MCHC: 34.4 g/dL (ref 30.0–36.0)
MCV: 94.7 fL (ref 80.0–100.0)
Platelets: 159 10*3/uL (ref 150–400)
RBC: 4.57 MIL/uL (ref 4.22–5.81)
RDW: 12.6 % (ref 11.5–15.5)
WBC: 10.8 10*3/uL — ABNORMAL HIGH (ref 4.0–10.5)
nRBC: 0 % (ref 0.0–0.2)

## 2022-11-25 LAB — GLUCOSE, CAPILLARY: Glucose-Capillary: 172 mg/dL — ABNORMAL HIGH (ref 70–99)

## 2022-11-25 LAB — PHOSPHORUS: Phosphorus: 3 mg/dL (ref 2.5–4.6)

## 2022-11-25 LAB — BRAIN NATRIURETIC PEPTIDE: B Natriuretic Peptide: 189.2 pg/mL — ABNORMAL HIGH (ref 0.0–100.0)

## 2022-11-25 LAB — MRSA NEXT GEN BY PCR, NASAL: MRSA by PCR Next Gen: NOT DETECTED

## 2022-11-25 MED ORDER — HYDRALAZINE HCL 20 MG/ML IJ SOLN: 10.0000 mg | Freq: Once | INTRAMUSCULAR | Status: DC

## 2022-11-25 MED ORDER — SODIUM CHLORIDE 0.9 % IV SOLN: INTRAVENOUS | Status: DC | PRN

## 2022-11-25 MED ORDER — NICARDIPINE HCL IN NACL 20-0.86 MG/200ML-% IV SOLN
3.0000 mg/h | INTRAVENOUS | Status: DC
  Administered 2022-11-25: 3 mg/h via INTRAVENOUS
  Filled 2022-11-25 (×3): qty 200

## 2022-11-25 MED ORDER — CHLORHEXIDINE GLUCONATE CLOTH 2 % EX PADS
6.0000 | MEDICATED_PAD | Freq: Every day | CUTANEOUS | Status: DC
  Administered 2022-11-26: 6 via TOPICAL

## 2022-11-25 MED ORDER — ONDANSETRON HCL 4 MG/2ML IJ SOLN: 4.0000 mg | Freq: Three times a day (TID) | INTRAMUSCULAR | Status: DC | PRN

## 2022-11-25 MED ORDER — HYDRALAZINE HCL 20 MG/ML IJ SOLN
10.0000 mg | Freq: Four times a day (QID) | INTRAMUSCULAR | Status: DC | PRN
  Administered 2022-11-25: 10 mg via INTRAVENOUS
  Filled 2022-11-25: qty 1

## 2022-11-25 MED ORDER — DIAZEPAM 5 MG/ML IJ SOLN
2.5000 mg | Freq: Three times a day (TID) | INTRAMUSCULAR | Status: DC | PRN
  Administered 2022-11-25 – 2022-11-27 (×4): 2.5 mg via INTRAVENOUS
  Filled 2022-11-25 (×4): qty 2

## 2022-11-25 NOTE — Progress Notes (Signed)
PT Cancellation Note  Patient Details Name: Ricardo Winters. MRN: [id number] DOB: 20-Sep-1946   Cancelled Treatment:    Reason Eval/Treat Not Completed: Medical issues which prohibited therapy   Offered session.  Declined stating "I have issues today"  he did ask for and was ok'ed by RN for some ice chips.  Will return at a later time/date.   Chesley Noon 11/25/2022, 3:42 PM

## 2022-11-25 NOTE — Progress Notes (Signed)
   11/25/22 1707  Assess: MEWS Score  Temp 98 F (36.7 C)  BP (!) 222/103  MAP (mmHg) 138  Pulse Rate 64  Resp 18  SpO2 95 %  O2 Device Nasal Cannula  O2 Flow Rate (L/min) 2 L/min  Assess: MEWS Score  MEWS Temp 0  MEWS Systolic 2  MEWS Pulse 0  MEWS RR 0  MEWS LOC 0  MEWS Score 2  MEWS Score Color Yellow  Assess: if the MEWS score is Yellow or Red  Were vital signs taken at a resting state? Yes  Focused Assessment No change from prior assessment  Does the patient meet 2 or more of the SIRS criteria? No  MEWS guidelines implemented *See Row Information* Yes  Treat  MEWS Interventions Administered prn meds/treatments  Take Vital Signs  Increase Vital Sign Frequency  Yellow: Q 2hr X 2 then Q 4hr X 2, if remains yellow, continue Q 4hrs  Escalate  MEWS: Escalate Yellow: discuss with charge nurse/RN and consider discussing with provider and RRT  Notify: Charge Nurse/RN  Name of Charge Nurse/RN Notified Olivia RN  Date Charge Nurse/RN Notified 11/25/22  Time Charge Nurse/RN Notified 1715  Provider Notification  Provider Name/Title Dr. Lysle Pearl -General Surgery  Date Provider Notified 11/25/22  Time Provider Notified 1730  Method of Notification Page  Notification Reason Change in status (Elevated BP)  Provider response Evaluate remotely;See new orders  Date of Provider Response 11/25/22  Time of Provider Response 1741  Document  Patient Outcome Transferred/level of care increased (Transfer orders for Stepdown written)  Assess: SIRS CRITERIA  SIRS Temperature  0  SIRS Pulse 0  SIRS Respirations  0  SIRS WBC 0  SIRS Score Sum  0

## 2022-11-25 NOTE — Progress Notes (Signed)
Pharmacy has been contacted for Cardene gtt. Relayed to oncoming shift.

## 2022-11-25 NOTE — Progress Notes (Signed)
Subjective:  CC: Ricardo Winters. is a 77 y.o. male  Hospital stay day 4, 4 Days Post-Op s/p exploratory laparotomy, placement of feeding jejunostomy, and reinforcement of contained perforated duodenal ulcer with purulent fluid in retroperitoneum.   HPI: No acute issues reported overnight.  ROS:  General: Denies weight loss, weight gain, fatigue, fevers, chills, and night sweats. Heart: Denies chest pain, palpitations, racing heart, irregular heartbeat, leg pain or swelling, and decreased activity tolerance. Respiratory: Denies breathing difficulty, shortness of breath, wheezing, cough, and sputum. GI: Denies change in appetite, heartburn, nausea, vomiting, constipation, diarrhea, and blood in stool. GU: Denies difficulty urinating, pain with urinating, urgency, frequency, blood in urine.   Objective:   Temp:  [98 F (36.7 C)-98.6 F (37 C)] 98 F (36.7 C) (01/28 0902) Pulse Rate:  [66-77] 66 (01/28 0902) Resp:  [16-18] 18 (01/28 0902) BP: (163-199)/(85-99) 174/85 (01/28 0902) SpO2:  [93 %-96 %] 93 % (01/28 0902)     Height: 6' (182.9 cm) Weight: 110.7 kg BMI (Calculated): 33.09   Intake/Output this shift:   Intake/Output Summary (Last 24 hours) at 11/25/2022 1524 Last data filed at 11/25/2022 1435 Gross per 24 hour  Intake 2177.92 ml  Output 2725 ml  Net -547.08 ml    Constitutional :  alert, cooperative, appears stated age, and no distress  Respiratory:  clear to auscultation bilaterally  Cardiovascular:  regular rate and rhythm  Gastrointestinal: Soft, no guarding, tenderness to palpation along incision sites as expected.  NG with bilious output.  JP drains with serous output.  Jejunostomy tube in place.  Tube feeds at goal rate. .   Skin: Cool and moist.  Staple line dressing clean dry and intact  Psychiatric: Normal affect, non-agitated, not confused       LABS:     Latest Ref Rng & Units 11/25/2022    9:25 AM 11/24/2022    5:28 AM 11/23/2022    2:09 AM  CMP   Glucose 70 - 99 mg/dL 149  116  179   BUN 8 - 23 mg/dL '14  17  23   '$ Creatinine 0.61 - 1.24 mg/dL 0.80  1.02  1.17   Sodium 135 - 145 mmol/L 132  139  138   Potassium 3.5 - 5.1 mmol/L 3.5  3.6  4.1   Chloride 98 - 111 mmol/L 99  105  107   CO2 22 - 32 mmol/L '30  29  26   '$ Calcium 8.9 - 10.3 mg/dL 8.2  7.9  7.9   Total Protein 6.5 - 8.1 g/dL   5.6   Total Bilirubin 0.3 - 1.2 mg/dL   2.1   Alkaline Phos 38 - 126 U/L   53   AST 15 - 41 U/L   24   ALT 0 - 44 U/L   30       Latest Ref Rng & Units 11/25/2022    9:25 AM 11/24/2022    5:28 AM 11/23/2022    2:09 AM  CBC  WBC 4.0 - 10.5 K/uL 10.8  11.4  13.6   Hemoglobin 13.0 - 17.0 g/dL 14.9  13.7  14.1   Hematocrit 39.0 - 52.0 % 43.3  40.3  40.8   Platelets 150 - 400 K/uL 159  140  139     RADS: N/a Assessment:   s/p exploratory laparotomy, placement of feeding jejunostomy, and reinforcement of contained perforated duodenal ulcer with purulent fluid in retroperitoneum.   Stable for now.  Dressing changed at bedside.  Continue current management with tube feeds.    labs/images/medications/previous chart entries reviewed personally and relevant changes/updates noted above.

## 2022-11-25 NOTE — Plan of Care (Signed)
Patient AOX4, VSS throughout shift.  All meds given on time as ordered.  Pt denied pain and SOB.  Diminished lungs, IS encouraged.  Pt voided in urinal.  All drains minimal output.  POC maintained, will continue to monitor.  Problem: Education: Goal: Knowledge of General Education information will improve Description: Including pain rating scale, medication(s)/side effects and non-pharmacologic comfort measures Outcome: Progressing   Problem: Health Behavior/Discharge Planning: Goal: Ability to manage health-related needs will improve Outcome: Progressing   Problem: Clinical Measurements: Goal: Ability to maintain clinical measurements within normal limits will improve Outcome: Progressing Goal: Will remain free from infection Outcome: Progressing Goal: Diagnostic test results will improve Outcome: Progressing Goal: Respiratory complications will improve Outcome: Progressing Goal: Cardiovascular complication will be avoided Outcome: Progressing   Problem: Activity: Goal: Risk for activity intolerance will decrease Outcome: Progressing   Problem: Nutrition: Goal: Adequate nutrition will be maintained Outcome: Progressing   Problem: Coping: Goal: Level of anxiety will decrease Outcome: Progressing   Problem: Elimination: Goal: Will not experience complications related to bowel motility Outcome: Progressing Goal: Will not experience complications related to urinary retention Outcome: Progressing   Problem: Pain Managment: Goal: General experience of comfort will improve Outcome: Progressing   Problem: Safety: Goal: Ability to remain free from injury will improve Outcome: Progressing   Problem: Skin Integrity: Goal: Risk for impaired skin integrity will decrease Outcome: Progressing

## 2022-11-25 NOTE — TOC Progression Note (Signed)
Transition of Care (TOC) - Progression Note    Patient Details  Name: Ricardo Winters. MRN: [id number] Date of Birth: 1946-05-20  Transition of Care [facility]) CM/SW Contact  Zigmund Daniel Dorian Pod, RN Phone Number: [phone] 11/25/2022, 3:21 PM  Clinical Narrative:    HHPT recommended as RN spoke with pt concerning [place] agencies and offered information on [email or web address] for choices. Pt requested to look at the website on choices prior to a decision on an agency. Will follow up Monday for choice before initiating the request for HHPT. No other needs presented at this time.  TOC will again follow up tomorrow on agency of choice for HHPT.        Expected Discharge Plan and Services                                               Social Determinants of Health (SDOH) Interventions SDOH Screenings   Food Insecurity: No Food Insecurity (11/21/2022)  Housing: Low Risk  (11/21/2022)  Transportation Needs: No Transportation Needs (11/21/2022)  Utilities: Not At Risk (11/21/2022)  Depression (PHQ2-9): Low Risk  (11/12/2022)  Tobacco Use: Low Risk  (11/22/2022)    Readmission Risk Interventions     No data to display

## 2022-11-25 NOTE — Consult Note (Signed)
Medical Consultation   Ricardo Winters.  HQI:[id number]  DOB: 06/16/1946  DOA: 11/21/2022  PCP: System, Provider Not In   Outpatient Specialists:    Requesting physician: Dr. Lysle Pearl of surgery  Reason for consultation: -Bp 222/103, hypertensive urgency   History of Present Illness: Ricardo Winters. is an 76 y.o. male with PMH of HTN, CAD, sCHF with EF 45%, who is admitted due to perforated duodenal ulcer on 1/24. Pt is 4 Days Post-Op s/p exploratory laparotomy. We are asked consult due to hypertensive urgency.  Patient normally takes Cozaar 100 mg daily at home.  Due to surgery, patient cannot take any oral pills.  Patient has been treated with as needed IV hydralazine after surgery.  Today patient was noted to have elevated blood pressure, up to 222/103.  Patient is asymptomatic.  Patient denies chest pain, cough, shortness breath, chest pain, palpitation.  Patient has mild abdominal pain in surgical site.  He has nausea, no vomiting or diarrhea.  No fever or chills.  No symptoms of UTI.  Date reviewed, lab, image and vitals: WBC 10.8, GFR> 60, potassium 3.5.  Blood pressure 222/103, heart rate 66, RR 18, oxygen saturation 95% on room air, temperature normal.   EKG: I reviewed EKG independently.  Sinus rhythm, QTc 425, LAE, early R wave progression, seems to have pacer marker in V2?  Review of Systems:   General: no fevers, chills, no changes in body weight, no changes in appetite Skin: no rash HEENT: no blurry vision, hearing changes or sore throat Pulm: no dyspnea, coughing, wheezing CV: no chest pain, palpitations, shortness of breath Abd: has nausea, no vomiting, has abdominal pain, no diarrhea/constipation GU: no dysuria, hematuria, polyuria Ext: no arthralgias, myalgias Neuro: no weakness, numbness, or tingling    Past Medical History: Past Medical History:  Diagnosis Date   Anginal pain ([place])    Cancer ([place])    skin - on the nose about  10-15 years ago    Coronary artery disease    COVID-17 Oct 2019   Edema    Hypertension    Myocardial infarction ([place])    Obesity    Varicose veins of both lower extremities     Past Surgical History: Past Surgical History:  Procedure Laterality Date   CARDIAC CATHETERIZATION  2015   stent   CATARACT EXTRACTION W/PHACO Right 11/30/2020   Procedure: CATARACT EXTRACTION PHACO AND INTRAOCULAR LENS PLACEMENT (IOC)  RIGHT 7.70 01:03.4 12.1%;  Surgeon: [name], MD;  Location: [facility];  Service: Ophthalmology;  Laterality: Right;  requests early   COLONOSCOPY WITH PROPOFOL N/A 12/12/2015   Procedure: COLONOSCOPY WITH PROPOFOL;  Surgeon: [name], MD;  Location: [facility] ENDOSCOPY;  Service: Endoscopy;  Laterality: N/A;   KNEE SURGERY     LAPAROTOMY N/A 11/21/2022   Procedure: EXPLORATORY LAPAROTOMY;  Surgeon: Jules Husbands, MD;  Location: [facility] ORS;  Service: General;  Laterality: N/A;   SHOULDER ACROMIOPLASTY     TOTAL HIP ARTHROPLASTY Bilateral    Right 2017, left 2020     Allergies:  No Known Allergies   Social History:  reports that he has never smoked. He has never used smokeless tobacco. He reports current alcohol use of about 4.0 standard drinks of alcohol per week. He reports that he does not use drugs.  Family History: Family History  Problem Relation Age of Onset   Heart  disease Father    Obesity Brother    Obesity Brother      Physical Exam: Vitals:   11/25/22 0440 11/25/22 0902 11/25/22 1707 11/25/22 1810  BP: (!) 163/90 (!) 174/85 (!) 222/103 (!) 197/98  Pulse: 75 66 64 74  Resp: '16 18 18 16  '$ Temp: 98.1 F (36.7 C) 98 F (36.7 C) 98 F (36.7 C) 99.3 F (37.4 C)  TempSrc:    Axillary  SpO2: 94% 93% 95% 93%  Weight:      Height:         General: Not in acute distress HEENT:       Eyes: PERRL, EOMI, no scleral icterus.       ENT: No discharge from the ears and nose, no pharynx injection, no tonsillar enlargement.         Neck: No JVD, no bruit, no mass felt. Heme: No neck lymph node enlargement. Cardiac: S1/S2, RRR, No murmurs, No gallops or rubs. Respiratory: No rales, wheezing, rhonchi or rubs. GI: Soft, nondistended, no rebound pain, no organomegaly, BS present. S/p of surgery, with a large clean surgical incision GU: No hematuria Ext: No pitting leg edema bilaterally. 1+DP/PT pulse bilaterally. Musculoskeletal: No joint deformities, No joint redness or warmth, no limitation of ROM in spin. Skin: No rashes.  Neuro: Alert, oriented X3, cranial nerves II-XII grossly intact, moves all extremities normally.  Psych: Patient is not psychotic, no suicidal or hemocidal ideation.    Data reviewed:  I have personally reviewed following labs and imaging studies Labs:  CBC: Recent Labs  Lab 11/21/22 1156 11/22/22 0454 11/23/22 0209 11/24/22 0528 11/25/22 0925  WBC 17.8* 15.3* 13.6* 11.4* 10.8*  NEUTROABS 14.4*  --   --   --   --   HGB 15.5 14.8 14.1 13.7 14.9  HCT 44.3 42.6 40.8 40.3 43.3  MCV 94.5 94.0 95.8 96.6 94.7  PLT 171 151 139* 140* 202    Basic Metabolic Panel: Recent Labs  Lab 11/21/22 1156 11/22/22 0454 11/23/22 0209 11/24/22 0528 11/25/22 0527 11/25/22 0925  NA 137 137 138 139  --  132*  K 4.0 4.0 4.1 3.6  --  3.5  CL 102 105 107 105  --  99  CO2 '25 24 26 29  '$ --  30  GLUCOSE 130* 163* 179* 116*  --  149*  BUN 27* 25* 23 17  --  14  CREATININE 1.17 1.32* 1.17 1.02  --  0.80  CALCIUM 9.3 8.4* 7.9* 7.9*  --  8.2*  MG  --  2.0 2.3 2.2 2.0  --   PHOS  --  3.1 2.6 2.1* 3.0  --    GFR Estimated Creatinine Clearance: 102.5 mL/min (by C-G formula based on SCr of 0.8 mg/dL). Liver Function Tests: Recent Labs  Lab 11/21/22 1156 11/22/22 0454 11/23/22 0209  AST 21 38 24  ALT 24 40 30  ALKPHOS 71 59 53  BILITOT 1.9* 2.8* 2.1*  PROT 6.7 6.1* 5.6*  ALBUMIN 4.0 3.3* 2.9*   Recent Labs  Lab 11/21/22 1156  LIPASE 42   No results for input(s): "AMMONIA" in the last 168  hours. Coagulation profile No results for input(s): "INR", "PROTIME" in the last 168 hours.  Cardiac Enzymes: No results for input(s): "CKTOTAL", "CKMB", "CKMBINDEX", "TROPONINI" in the last 168 hours. BNP: Invalid input(s): "POCBNP" CBG: Recent Labs  Lab 11/24/22 0838  GLUCAP 144*   D-Dimer No results for input(s): "DDIMER" in the last 72 hours. Hgb A1c No results  for input(s): "HGBA1C" in the last 72 hours. Lipid Profile No results for input(s): "CHOL", "HDL", "LDLCALC", "TRIG", "CHOLHDL", "LDLDIRECT" in the last 72 hours. Thyroid function studies No results for input(s): "TSH", "T4TOTAL", "T3FREE", "THYROIDAB" in the last 72 hours.  Invalid input(s): "FREET3" Anemia work up No results for input(s): "VITAMINB12", "FOLATE", "FERRITIN", "TIBC", "IRON", "RETICCTPCT" in the last 72 hours. Urinalysis    Component Value Date/Time   COLORURINE YELLOW (A) 11/21/2022 1156   APPEARANCEUR CLOUDY (A) 11/21/2022 1156   LABSPEC 1.027 11/21/2022 1156   PHURINE 5.0 11/21/2022 [place] 11/21/2022 1156   HGBUR NEGATIVE 11/21/2022 [place] 11/21/2022 [place] 11/21/2022 [place] 11/21/2022 1156   NITRITE NEGATIVE 11/21/2022 [place] 11/21/2022 1156     Microbiology No results found for this or any previous visit (from the past 240 hour(s)).     Inpatient Medications:   Scheduled Meds:  enoxaparin (LOVENOX) injection  0.5 mg/kg Subcutaneous Q24H   feeding supplement (PROSource TF20)  60 mL Per Tube Daily   fentaNYL  1 patch Transdermal Q72H   free water  30 mL Per Tube Q4H   lidocaine  2 patch Transdermal Q24H   octreotide  100 mcg Subcutaneous Q8H   pantoprazole (PROTONIX) IV  40 mg Intravenous Q12H   Continuous Infusions:  sodium chloride Stopped (11/25/22 1453)   feeding supplement (OSMOLITE 1.5 CAL) 70 mL/hr at 11/25/22 1641   fluconazole (DIFLUCAN) IV 800 mg (11/25/22 1453)    methocarbamol (ROBAXIN) IV Stopped (11/22/22 1239)   niCARDipine     piperacillin-tazobactam (ZOSYN)  IV 3.375 g (11/25/22 1452)     Radiological Exams on Admission: No results found.  Impression/Recommendations Principal Problem:   Perforated duodenal ulcer ([place]) Active Problems:   Hypertensive urgency   HTN (hypertension)   Chronic systolic CHF (congestive heart failure) (HCC)   CAD (coronary artery disease)    Assessment and Plan:  Perforated duodenal ulcer [facility]): Patient had a successful surgery. Now pt is 4 Days Post-Op s/p exploratory laparotomy. NG with bilious output.  JP drains with serous output.  Jejunostomy tube in place.  No fever.  No leukocytosis.  -pt is on tube feeding with Osmolite -Antibiotics: Zosyn and Diflucan -On Lovenox 55 mg daily for prophylaxis -IV fluid: 50 cc/h of normal saline -IV Protonix -Octreotide 100 mcg 3 times daily -Pain control: As needed morphine, oxycodone -As needed Zofran  Hypertensive urgency and history of hypertension: Current pressure 222/103.  Patient is asymptomatic.  Patient's Cozaar is on hold -Start Cardene drip, with target goal for blood pressure 160-180 -Transfer patient to stepdown  Chronic systolic CHF (congestive heart failure) ([place]): 2D echo on 04/08/2020 showed EF of 45%.  Patient does not have leg edema JVD.  CHF seem to be compensated. -Watch volume status closely -Check BNP  CAD (coronary artery disease): No chest pain -Blood pressure control -Aspirin is on hold   Patient's girlfriend is at the bedside  Thank you for this consultation.  Our [facility] hospitalist team will follow the patient with you.  Time Spent: 35 min     Ivor Costa M.D. [facility] 11/25/2022, 6:35 PM

## 2022-11-25 NOTE — Progress Notes (Signed)
   11/25/22 1840  Hand-off documentation  Hand-off Given Given to Transfer Unit/facility (2C to ICU/Stepdown)  Report given to (Full Name) Claiborne Billings RN  Hand-off Received Received from shift RN/LPN  Report received from (Full Name) Karlene Einstein RN   Patient transported via bed to ICU/CCU for higher level of care.  Report given to [employer]. Patient educated about transfer and family at bedside aware.  SBAR Handoff completed.

## 2022-11-25 NOTE — Progress Notes (Signed)
Patient removed fentanyl patch on his own.  He stated that he believed it was the cause of his anxiety.

## 2022-11-26 DIAGNOSIS — K265 Chronic or unspecified duodenal ulcer with perforation: Secondary | ICD-10-CM | POA: Diagnosis not present

## 2022-11-26 LAB — CBC
HCT: 42.4 % (ref 39.0–52.0)
Hemoglobin: 15 g/dL (ref 13.0–17.0)
MCH: 32.8 pg (ref 26.0–34.0)
MCHC: 35.4 g/dL (ref 30.0–36.0)
MCV: 92.8 fL (ref 80.0–100.0)
Platelets: 169 10*3/uL (ref 150–400)
RBC: 4.57 MIL/uL (ref 4.22–5.81)
RDW: 12.5 % (ref 11.5–15.5)
WBC: 9.7 10*3/uL (ref 4.0–10.5)
nRBC: 0 % (ref 0.0–0.2)

## 2022-11-26 LAB — BASIC METABOLIC PANEL
Anion gap: 11 (ref 5–15)
BUN: 16 mg/dL (ref 8–23)
CO2: 25 mmol/L (ref 22–32)
Calcium: 8.7 mg/dL — ABNORMAL LOW (ref 8.9–10.3)
Chloride: 101 mmol/L (ref 98–111)
Creatinine, Ser: 0.85 mg/dL (ref 0.61–1.24)
GFR, Estimated: >60 mL/min (ref >60)
Glucose, Bld: 171 mg/dL — ABNORMAL HIGH (ref 70–99)
Potassium: 3.5 mmol/L (ref 3.5–5.1)
Sodium: 137 mmol/L (ref 135–145)

## 2022-11-26 MED ORDER — CHLORHEXIDINE GLUCONATE CLOTH 2 % EX PADS: 6.0000 | MEDICATED_PAD | Freq: Every day | CUTANEOUS | Status: DC

## 2022-11-26 MED ORDER — CLONIDINE HCL 0.1 MG/24HR TD PTWK
0.1000 mg | MEDICATED_PATCH | TRANSDERMAL | Status: DC
  Administered 2022-11-26: 0.1 mg via TRANSDERMAL
  Filled 2022-11-26: qty 1

## 2022-11-26 MED ORDER — HYDRALAZINE HCL 20 MG/ML IJ SOLN
10.0000 mg | Freq: Four times a day (QID) | INTRAMUSCULAR | Status: DC | PRN
  Administered 2022-11-26 – 2022-11-27 (×2): 10 mg via INTRAVENOUS
  Filled 2022-11-26 (×3): qty 1

## 2022-11-26 MED ORDER — CHLORHEXIDINE GLUCONATE CLOTH 2 % EX PADS
6.0000 | MEDICATED_PAD | Freq: Every day | CUTANEOUS | Status: DC
  Administered 2022-11-27 – 2022-12-03 (×6): 6 via TOPICAL

## 2022-11-26 NOTE — TOC Progression Note (Signed)
Transition of Care (TOC) - Progression Note    Patient Details  Name: Ricardo Winters. MRN: [id number] Date of Birth: [birth year]  Transition of Care [facility]) CM/SW Contact  Shelbie Hutching, RN Phone Number: 11/26/2022, 10:32 AM  Clinical Narrative:    Patient in stepdown for blood pressure management with nicardipine infusion.  NG tube to LIWS.  J tube with tube feeds.   Met with patient at the bedside to discuss home health recommendation, patient reports that he has not had time to review the list, "yesterday was a bad day".   TOC will follow up tomorrow.    Expected Discharge Plan: [place] Barriers to Discharge: Continued Medical Work up  Expected Discharge Plan and Services   Discharge Planning Services: CM Consult Post Acute Care Choice: [place] arrangements for the past 2 months: Single Family Home                                       Social Determinants of Health (SDOH) Interventions SDOH Screenings   Food Insecurity: No Food Insecurity (11/21/2022)  Housing: Low Risk  (11/21/2022)  Transportation Needs: No Transportation Needs (11/21/2022)  Utilities: Not At Risk (11/21/2022)  Depression (PHQ2-9): Low Risk  (11/12/2022)  Tobacco Use: Low Risk  (11/22/2022)    Readmission Risk Interventions     No data to display

## 2022-11-26 NOTE — Progress Notes (Signed)
[place] Hospital Day(s): 5.   Post op day(s): 5 Days Post-Op.   Interval History:  Patient seen and examined Transferred to ICU secondary to HTN; on Nicardipine IV Patient doing well; anxious to progress Incisional soreness with movement otherwise doing well No fever, chills, nausea, emesis Some anxiety given the situation  CBC is grossly normal Renal function normal; sCr - 0.85; UO - 2550 ccs No electrolyte derangements  NGT in place; output 100 ccs Drains x2 in place  - RUQ: 55 ccs; serous  - LUQ: 5 ccs; serous She is NPO Does have jejunostomy; on TF at 40 ml/hr Continues on Zosyn   Vital signs in last 24 hours: [min-max] current  Temp:  [98 F (36.7 C)-99.3 F (37.4 C)] 98.2 F (36.8 C) (01/29 0100) Pulse Rate:  [64-137] 64 (01/29 0700) Resp:  [13-21] 17 (01/29 0700) BP: (114-222)/(61-129) 138/71 (01/29 0700) SpO2:  [90 %-99 %] 93 % (01/29 0700) Weight:  [107.2 kg] 107.2 kg (01/29 0500)     Height: 6' (182.9 cm) Weight: 107.2 kg BMI (Calculated): 32.05   Intake/Output last 2 shifts:  01/28 0701 - 01/29 0700 In: 4306 [I.V.:1554.1; NG/GT:2069.7; IV Piggyback:682.2] Out: 2710 [Urine:2550; Emesis/NG output:100; Drains:60]   Physical Exam:  Constitutional: alert, cooperative and no distress  HEENT: NGT in place Respiratory: breathing non-labored at rest  Cardiovascular: regular rate and sinus rhythm  Gastrointestinal: Soft, incisional soreness, non-distended, no rebound/guarding. Feeding jejunostomy in left abdomen. RUQ drain and LUQ drain present; both now serous  Integumentary: Laparotomy is CDI with staples, no erythema   Labs:     Latest Ref Rng & Units 11/26/2022    3:13 AM 11/25/2022    9:25 AM 11/24/2022    5:28 AM  CBC  WBC 4.0 - 10.5 K/uL 9.7  10.8  11.4   Hemoglobin 13.0 - 17.0 g/dL 15.0  14.9  13.7   Hematocrit 39.0 - 52.0 % 42.4  43.3  40.3   Platelets 150 - 400 K/uL 169  159  140       Latest Ref Rng &  Units 11/26/2022    3:13 AM 11/25/2022    9:25 AM 11/24/2022    5:28 AM  CMP  Glucose 70 - 99 mg/dL 171  149  116   BUN 8 - 23 mg/dL '16  14  17   '$ Creatinine 0.61 - 1.24 mg/dL 0.85  0.80  1.02   Sodium 135 - 145 mmol/L 137  132  139   Potassium 3.5 - 5.1 mmol/L 3.5  3.5  3.6   Chloride 98 - 111 mmol/L 101  99  105   CO2 22 - 32 mmol/L '25  30  29   '$ Calcium 8.9 - 10.3 mg/dL 8.7  8.2  7.9     Imaging studies: No new pertinent imaging studies   Assessment/Plan: 77 y.o. male 5 Days Post-Op s/p exploratory laparotomy, placement of feeding jejunostomy, and reinforcement of contained perforated duodenal ulcer with purulent fluid in retroperitoneum.   - Will plan for UGI on Wednesday (01/31) to reassess repair and ensure no leaks prior to considering NGT removal and initiation of diet.    - Appreciate medicine assistance with HTN - Continue NPO with NGT decompression for now  - Okay to continue TF via jejunostomy; NO PILLS via jejunostomy   - Continue IV Abx (Zosyn)  - Continue IV PPI BID  - Continue IVF resuscitation; monitor renal function  - Continue surgical drains x2; monitor  and record output   - Monitor abdominal examination; on-going bowel function - Pain control prn; IV Tylenol, Morphine, Robaxin. We are limited secondary to need to be NPO for extended time.   - Antiemetics prn    - Okay to mobilize when feasible  All of the above findings and recommendations were discussed with the patient, and the medical team, and all of patient's questions were answered to his expressed satisfaction.  -- Edison Simon, PA-C [facility] 11/26/2022, 7:31 AM M-F: 7am - 4pm

## 2022-11-26 NOTE — Progress Notes (Signed)
PROGRESS NOTE    Ricardo Winters.  GGY:[id number] DOB: 09-05-1946 DOA: 11/21/2022 PCP: System, Provider Not In    Brief Narrative:  Ricardo Winters. is an 77 y.o. male with PMH of HTN, CAD, sCHF with EF 45%, who is admitted due to perforated duodenal ulcer on 1/24. Pt is 4 Days Post-Op s/p exploratory laparotomy. We are asked consult due to hypertensive urgency.   Patient normally takes Cozaar 100 mg daily at home.  Due to surgery, patient cannot take any oral pills.  Patient has been treated with as needed IV hydralazine after surgery.  Today patient was noted to have elevated blood pressure, up to 222/103.  Patient is asymptomatic.  Patient denies chest pain, cough, shortness breath, chest pain, palpitation.  Patient has mild abdominal pain in surgical site.  He has nausea, no vomiting or diarrhea.  No fever or chills.  No symptoms of UTI.  Transfer to stepdown and initiation of nicardipine infusion.  Seen by myself on 1/29.  Patient asymptomatic.  Blood pressure control improved on drip.  Discussed with general surgery.  Plan to wean off gtt.  Will initiate clonidine transdermal patch.  As needed IV hydralazine with parameters.  From a medicine standpoint if able to wean from Cardene gtt. patient does not need stepdown unit.  Assessment & Plan:   Principal Problem:   Perforated duodenal ulcer ([place]) Active Problems:   Hypertensive urgency   HTN (hypertension)   Chronic systolic CHF (congestive heart failure) (HCC)   CAD (coronary artery disease)  Hypertensive urgency and history of hypertension: Current pressure much improved after initiation of Cardene gtt.  Home Cozaar on hold given strict n.p.o. status.  Patient asymptomatic.  Plan: Clonidine transdermal 0.1 mg every 72 hours Hydralazine 10 mg IV every 6 hours as needed SBP greater than 165 Wean off Cardene gtt. If BP stable off Cardene patient does not require stepdown status from medicine standpoint  Perforated duodenal  ulcer [facility]): Patient had a successful surgery. Now pt is 4 Days Post-Op s/p exploratory laparotomy. NG with bilious output.  JP drains with serous output.  Jejunostomy tube in place.  No fever.  No leukocytosis. Plan: -pt is on tube feeding with Osmolite -Antibiotics: Zosyn and Diflucan -On Lovenox 55 mg daily for prophylaxis -IV fluid: 50 cc/h of normal saline -IV Protonix -Octreotide 100 mcg 3 times daily -Pain control: As needed morphine, oxycodone -As needed Zofran -Management as per general surgery    Chronic systolic CHF (congestive heart failure) ([place]): 2D echo on 04/08/2020 showed EF of 45%.  Patient does not have leg edema JVD.  CHF seem to be compensated. -Watch volume status closely    CAD (coronary artery disease): No chest pain -Blood pressure control -Aspirin is on hold   DVT prophylaxis: SQ Lovenox Code Status: Full Family Communication: None today Disposition Plan: Status is: Inpatient Remains inpatient appropriate because: Disposition per general surgery   Level of care: Stepdown  Consultants:  Hospitalist  Procedures:  Per general surgery  Antimicrobials: Zosyn Diflucan   Subjective: Seen and examined.  Sitting up in bed.  No visible distress.  No complaints of pain.  Objective: Vitals:   11/26/22 1100 11/26/22 1130 11/26/22 1208 11/26/22 1300  BP: (!) 157/90 (!) 146/85 (!) 158/90 (!) 169/88  Pulse: 83 63 70 65  Resp: '14 14 14 12  '$ Temp:   99 F (37.2 C)   TempSrc:   Oral   SpO2: 95% 95% 95% 96%  Weight:  Height:        Intake/Output Summary (Last 24 hours) at 11/26/2022 1337 Last data filed at 11/26/2022 1101 Gross per 24 hour  Intake 3959.27 ml  Output 2610 ml  Net 1349.27 ml   Filed Weights   11/21/22 1152 11/24/22 0600 11/26/22 0500  Weight: 104.3 kg 110.7 kg 107.2 kg    Examination:  General exam: Appears calm and comfortable  Respiratory system: Clear to auscultation. Respiratory effort normal. Cardiovascular system:  S1-S2, RRR, no murmurs, no pedal edema Gastrointestinal system: Midline surgical incision.  Wound CDI Central nervous system: Alert and oriented. No focal neurological deficits. Extremities: Symmetric 5 x 5 power. Skin: No rashes, lesions or ulcers Psychiatry: Judgement and insight appear normal. Mood & affect appropriate.     Data Reviewed: I have personally reviewed following labs and imaging studies  CBC: Recent Labs  Lab 11/21/22 1156 11/22/22 0454 11/23/22 0209 11/24/22 0528 11/25/22 0925 11/26/22 0313  WBC 17.8* 15.3* 13.6* 11.4* 10.8* 9.7  NEUTROABS 14.4*  --   --   --   --   --   HGB 15.5 14.8 14.1 13.7 14.9 15.0  HCT 44.3 42.6 40.8 40.3 43.3 42.4  MCV 94.5 94.0 95.8 96.6 94.7 92.8  PLT 171 151 139* 140* 159 921   Basic Metabolic Panel: Recent Labs  Lab 11/22/22 0454 11/23/22 0209 11/24/22 0528 11/25/22 0527 11/25/22 0925 11/26/22 0313  NA 137 138 139  --  132* 137  K 4.0 4.1 3.6  --  3.5 3.5  CL 105 107 105  --  99 101  CO2 '24 26 29  '$ --  30 25  GLUCOSE 163* 179* 116*  --  149* 171*  BUN 25* 23 17  --  14 16  CREATININE 1.32* 1.17 1.02  --  0.80 0.85  CALCIUM 8.4* 7.9* 7.9*  --  8.2* 8.7*  MG 2.0 2.3 2.2 2.0  --   --   PHOS 3.1 2.6 2.1* 3.0  --   --    GFR: Estimated Creatinine Clearance: 95 mL/min (by C-G formula based on SCr of 0.85 mg/dL). Liver Function Tests: Recent Labs  Lab 11/21/22 1156 11/22/22 0454 11/23/22 0209  AST 21 38 24  ALT 24 40 30  ALKPHOS 71 59 53  BILITOT 1.9* 2.8* 2.1*  PROT 6.7 6.1* 5.6*  ALBUMIN 4.0 3.3* 2.9*   Recent Labs  Lab 11/21/22 1156  LIPASE 42   No results for input(s): "AMMONIA" in the last 168 hours. Coagulation Profile: No results for input(s): "INR", "PROTIME" in the last 168 hours. Cardiac Enzymes: No results for input(s): "CKTOTAL", "CKMB", "CKMBINDEX", "TROPONINI" in the last 168 hours. BNP (last 3 results) No results for input(s): "PROBNP" in the last 8760 hours. HbA1C: No results for input(s):  "HGBA1C" in the last 72 hours. CBG: Recent Labs  Lab 11/24/22 0838 11/25/22 1846  GLUCAP 144* 172*   Lipid Profile: No results for input(s): "CHOL", "HDL", "LDLCALC", "TRIG", "CHOLHDL", "LDLDIRECT" in the last 72 hours. Thyroid Function Tests: No results for input(s): "TSH", "T4TOTAL", "FREET4", "T3FREE", "THYROIDAB" in the last 72 hours. Anemia Panel: No results for input(s): "VITAMINB12", "FOLATE", "FERRITIN", "TIBC", "IRON", "RETICCTPCT" in the last 72 hours. Sepsis Labs: No results for input(s): "PROCALCITON", "LATICACIDVEN" in the last 168 hours.  Recent Results (from the past 240 hour(s))  MRSA Next Gen by PCR, Nasal     Status: None   Collection Time: 11/25/22  6:46 PM   Specimen: Nasal Mucosa; Nasal Swab  Result Value Ref  Range Status   MRSA by PCR Next Gen NOT DETECTED NOT DETECTED Final    Comment: (NOTE) The GeneXpert MRSA Assay (FDA approved for NASAL specimens only), is one component of a comprehensive MRSA colonization surveillance program. It is not intended to diagnose MRSA infection nor to guide or monitor treatment for MRSA infections. Test performance is not FDA approved in patients less than 108 years old. Performed at [facility], [address], [place],  [address]          Radiology Studies: No results found.      Scheduled Meds:  cloNIDine  0.1 mg Transdermal Weekly   enoxaparin (LOVENOX) injection  0.5 mg/kg Subcutaneous Q24H   feeding supplement (PROSource TF20)  60 mL Per Tube Daily   fentaNYL  1 patch Transdermal Q72H   free water  30 mL Per Tube Q4H   lidocaine  2 patch Transdermal Q24H   octreotide  100 mcg Subcutaneous Q8H   pantoprazole (PROTONIX) IV  40 mg Intravenous Q12H   Continuous Infusions:  sodium chloride 10 mL/hr at 11/26/22 1200   feeding supplement (OSMOLITE 1.5 CAL) 70 mL/hr at 11/26/22 1200   fluconazole (DIFLUCAN) IV Stopped (11/25/22 2005)   methocarbamol (ROBAXIN) IV Stopped (11/22/22 1239)    niCARDipine Stopped (11/26/22 1022)   piperacillin-tazobactam (ZOSYN)  IV Stopped (11/26/22 0929)     LOS: 5 days        Sidney Ace, MD [facility]   If 7PM-7AM, please contact night-coverage  11/26/2022, 1:37 PM

## 2022-11-26 NOTE — Plan of Care (Signed)
Patient remains in SDU. JP x 2. TF via J-tube per order. NGT to stomach to LIWS. No supplemental O2 requirement. No active infusions.   Problem: Education: Goal: Knowledge of General Education information will improve Description: Including pain rating scale, medication(s)/side effects and non-pharmacologic comfort measures Outcome: Progressing   Problem: Health Behavior/Discharge Planning: Goal: Ability to manage health-related needs will improve Outcome: Progressing   Problem: Clinical Measurements: Goal: Ability to maintain clinical measurements within normal limits will improve Outcome: Progressing Goal: Will remain free from infection Outcome: Progressing Goal: Diagnostic test results will improve Outcome: Progressing Goal: Respiratory complications will improve Outcome: Progressing Goal: Cardiovascular complication will be avoided Outcome: Progressing   Problem: Activity: Goal: Risk for activity intolerance will decrease Outcome: Progressing   Problem: Nutrition: Goal: Adequate nutrition will be maintained Outcome: Progressing   Problem: Coping: Goal: Level of anxiety will decrease Outcome: Progressing   Problem: Elimination: Goal: Will not experience complications related to bowel motility Outcome: Progressing Goal: Will not experience complications related to urinary retention Outcome: Progressing   Problem: Pain Managment: Goal: General experience of comfort will improve Outcome: Progressing   Problem: Safety: Goal: Ability to remain free from injury will improve Outcome: Progressing   Problem: Skin Integrity: Goal: Risk for impaired skin integrity will decrease Outcome: Progressing

## 2022-11-26 NOTE — Progress Notes (Signed)
Nutrition Follow Up Note   DOCUMENTATION CODES:   Obesity unspecified  INTERVENTION:   Continue Osmolite 1.5'@70ml'$ /hr  ProSource TF 20- Give 7m daily via tube, each supplement provides 80kcal and 20g of protein.   Free water flushes 345mq4 hours to maintain tube patency   Regimen provides 2600kcal/day, 125g/day protein and 146034may of free water.   Daily weights   NUTRITION DIAGNOSIS:   Inadequate oral intake related to altered GI function as evidenced by NPO status.  GOAL:   Patient will meet greater than or equal to 90% of their needs -met   MONITOR:   Diet advancement, Labs, Weight trends, TF tolerance, I & O's, Skin  ASSESSMENT:   [age] male with h/o CAD, HTN, DVT, HLD and MI who is admitted with perforated duodenal ulcer now s/p exploratory laparotomy 1/24 (with placement of feeding jejunostomy, and reinforcement of contained perforated duodenal ulcer).  Pt transferred to the ICU for HTN requiring nicardipine. Pt tolerating tube feeds well at goal rate via J-tube. NGT remains in place with ~350m29m the canister. Pt denies any abdominal pain other than incisional soreness. No BM noted since 1/24. Plan is for UGI on 1/31. Pt remains NPO. Per chart, pt is up ~7lbs from admission and ~11lbs from his UBW. Pt +3.5L on his I & Os. IVF discontinued today.   Medications reviewed and include: lovenox, octreotide, protonix, diflucan, zosyn   Labs reviewed: K 3.5 wnl P 3.0 wnl, Mg 2.0 wnl- 1/28 BNP- 189.2(H)- 1/28  Drains: - RUQ: 55 c60; serosanguinous - LUQ: 5 ccs; this does appear more bilious  Diet Order:   Diet Order             Diet NPO time specified  Diet effective now                  EDUCATION NEEDS:   Education needs have been addressed  Skin:  Skin Assessment: Reviewed RN Assessment (incision abdomen)  Last BM:  1/24  Height:   Ht Readings from Last 1 Encounters:  11/21/22 6' (1.829 m)    Weight:   Wt Readings from Last 1  Encounters:  11/26/22 107.2 kg    Ideal Body Weight:  80.9 kg  BMI:  Body mass index is 32.05 kg/m.  Estimated Nutritional Needs:   Kcal:  2400-2700kcal/day  Protein:  120-135g/day  Fluid:  2.1-2.4L/day  CaseKoleen Distance RD, LDN Please refer to [facility] and/or RD on-call/weekend/after hours pager

## 2022-11-27 DIAGNOSIS — K265 Chronic or unspecified duodenal ulcer with perforation: Secondary | ICD-10-CM | POA: Diagnosis not present

## 2022-11-27 LAB — GLUCOSE, CAPILLARY
Glucose-Capillary: 115 mg/dL — ABNORMAL HIGH (ref 70–99)
Glucose-Capillary: 131 mg/dL — ABNORMAL HIGH (ref 70–99)
Glucose-Capillary: 132 mg/dL — ABNORMAL HIGH (ref 70–99)
Glucose-Capillary: 141 mg/dL — ABNORMAL HIGH (ref 70–99)
Glucose-Capillary: 179 mg/dL — ABNORMAL HIGH (ref 70–99)

## 2022-11-27 LAB — BASIC METABOLIC PANEL
Anion gap: 5 (ref 5–15)
BUN: 19 mg/dL (ref 8–23)
CO2: 24 mmol/L (ref 22–32)
Calcium: 8.1 mg/dL — ABNORMAL LOW (ref 8.9–10.3)
Chloride: 106 mmol/L (ref 98–111)
Creatinine, Ser: 0.93 mg/dL (ref 0.61–1.24)
GFR, Estimated: >60 mL/min (ref >60)
Glucose, Bld: 148 mg/dL — ABNORMAL HIGH (ref 70–99)
Potassium: 3.4 mmol/L — ABNORMAL LOW (ref 3.5–5.1)
Sodium: 135 mmol/L (ref 135–145)

## 2022-11-27 LAB — MAGNESIUM: Magnesium: 2.3 mg/dL (ref 1.7–2.4)

## 2022-11-27 MED ORDER — ORAL CARE MOUTH RINSE
15.0000 mL | OROMUCOSAL | Status: DC
  Administered 2022-11-27 – 2022-12-03 (×26): 15 mL via OROMUCOSAL

## 2022-11-27 MED ORDER — INSULIN ASPART 100 UNIT/ML IJ SOLN
0.0000 [IU] | INTRAMUSCULAR | Status: DC
  Administered 2022-11-27 – 2022-11-28 (×2): 1 [IU] via SUBCUTANEOUS
  Administered 2022-11-28: 2 [IU] via SUBCUTANEOUS
  Administered 2022-11-28 (×2): 1 [IU] via SUBCUTANEOUS
  Administered 2022-11-30: 2 [IU] via SUBCUTANEOUS
  Administered 2022-11-30: 1 [IU] via SUBCUTANEOUS
  Administered 2022-12-01 – 2022-12-03 (×2): 2 [IU] via SUBCUTANEOUS
  Filled 2022-11-27 (×8): qty 1

## 2022-11-27 MED ORDER — POTASSIUM CHLORIDE 10 MEQ/100ML IV SOLN
10.0000 meq | INTRAVENOUS | Status: AC
  Administered 2022-11-27 (×2): 10 meq via INTRAVENOUS
  Filled 2022-11-27 (×2): qty 100

## 2022-11-27 MED ORDER — OSMOLITE 1.5 CAL PO LIQD
1000.0000 mL | ORAL | Status: DC
  Administered 2022-11-28: 1000 mL

## 2022-11-27 MED ORDER — ORAL CARE MOUTH RINSE: 15.0000 mL | OROMUCOSAL | Status: DC | PRN

## 2022-11-27 MED ORDER — DIAZEPAM 5 MG/ML IJ SOLN
5.0000 mg | Freq: Three times a day (TID) | INTRAMUSCULAR | Status: DC | PRN
  Administered 2022-11-27: 5 mg via INTRAVENOUS
  Administered 2022-11-27: 2.5 mg via INTRAVENOUS
  Administered 2022-11-28 – 2022-12-03 (×7): 5 mg via INTRAVENOUS
  Filled 2022-11-27 (×9): qty 2

## 2022-11-27 NOTE — Progress Notes (Signed)
Nutrition Brief Follow Up Note   Spoke with RN who reports that ProSource is unable to be given r/t issues with the end of the tube. RD will discontinue.   INTERVENTION:   Increase Osmolite 1.5'@75ml'$ /hr  Discontinue ProSource TF 20  Free water flushes 36m q4 hours to maintain tube patency   Regimen provides 2700kcal/day, 113g/day protein and 15542mday of free water.   Daily weights   Estimated Nutritional Needs:   Kcal:  2400-2700kcal/day  Protein:  120-135g/day  Fluid:  2.1-2.4L/day  CaKoleen DistanceS, RD, LDN Please [facility] to [facility] and/or RD on-call/weekend/after hours pager

## 2022-11-27 NOTE — Progress Notes (Signed)
Physical Therapy Treatment Patient Details Name: Ricardo Winters. MRN: [id number] DOB: April 29, 1946 Today's Date: 11/27/2022   History of Present Illness Pt is a 77 y/o M admitted on 11/21/22 after presenting with RLQ pain since the day prior. CT Abdomen/Pelvis was concerning for possible perforated duodenal ulcer & general sx was consulted. Pt underwent exploratory laparotomy, placement of feeding jejunostomy, & reinforcement of contained perforated duodenal ulcer with purulent fluid in retroperitoneum on 11/21/22. PMH: lumbar spondylosis, chronic pain syndrome, lumbar radiculopathy, chronic SI joint pain, syncope, HTN, CAD, HLD, MI.    PT Comments    Pt initially declined to work with PT services secondary to abdominal pain with movement with pt stating he may soon have decreased lines/leads/tubes/drains as early as tomorrow and wanting to hold off mobility until then.  Pt education provided on physiological benefits of activity with pt agreeing to supine therex.  Pt ended up putting forth very good effort with below therex with manual resistance added when appropriate and as tolerated.  Pt reported no adverse symptoms during the session with SpO2 and HR WNL throughout on room air.  Pt will benefit from HHPT upon discharge to safely address deficits listed in patient problem list for decreased caregiver assistance and eventual return to PLOF.    Recommendations for follow up therapy are one component of a multi-disciplinary discharge planning process, led by the attending physician.  Recommendations may be updated based on patient status, additional functional criteria and insurance authorization.  Follow Up Recommendations  Home health PT     Assistance Recommended at Discharge Intermittent Supervision/Assistance  Patient can return home with the following A little help with walking and/or transfers;A little help with bathing/dressing/bathroom;Assistance with cooking/housework;Assist for  transportation;Help with stairs or ramp for entrance   Equipment Recommendations  None recommended by PT    Recommendations for Other Services       Precautions / Restrictions Precautions Precautions: Fall Precaution Comments: log roll for abdominal comfort Restrictions Weight Bearing Restrictions: No Other Position/Activity Restrictions: Multiple lines/leads/tubes and drains     Mobility  Bed Mobility               General bed mobility comments: Pt requested supine therex only this session    Transfers                        Ambulation/Gait                   Stairs             Wheelchair Mobility    Modified Rankin (Stroke Patients Only)       Balance                                            Cognition Arousal/Alertness: Awake/alert Behavior During Therapy: WFL for tasks assessed/performed Overall Cognitive Status: Within Functional Limits for tasks assessed                                          Exercises Total Joint Exercises Ankle Circles/Pumps: Strengthening, Both, 10 reps (with manual resistance) Quad Sets: Strengthening, Both, 10 reps Gluteal Sets: Strengthening, Both, 10 reps Towel Squeeze: Strengthening, Both, 10 reps Short Arc Quad: Strengthening, Both, 10 reps (with  manual resistance) Heel Slides: Strengthening, Both, 10 reps Hip ABduction/ADduction: Strengthening, Both, 10 reps (Isometric with manual resistance) Straight Leg Raises: Strengthening, Both, 5 reps Other Exercises Other Exercises: BLE leg press x 10 with manual resistance    General Comments        Pertinent Vitals/Pain Pain Assessment Pain Assessment: No/denies pain    Home Living                          Prior Function            PT Goals (current goals can now be found in the care plan section) Progress towards PT goals: PT to reassess next treatment    Frequency    Min  2X/week      PT Plan Current plan remains appropriate    Co-evaluation              AM-PAC PT "6 Clicks" Mobility   Outcome Measure  Help needed turning from your back to your side while in a flat bed without using bedrails?: A Little Help needed moving from lying on your back to sitting on the side of a flat bed without using bedrails?: A Little Help needed moving to and from a bed to a chair (including a wheelchair)?: A Little Help needed standing up from a chair using your arms (e.g., wheelchair or bedside chair)?: A Little Help needed to walk in hospital room?: A Little Help needed climbing 3-5 steps with a railing? : A Little 6 Click Score: 18    End of Session   Activity Tolerance: Patient tolerated treatment well Patient left: in bed;with call bell/phone within reach;with bed alarm set Nurse Communication: Mobility status PT Visit Diagnosis: Muscle weakness (generalized) (M62.81);Difficulty in walking, not elsewhere classified (R26.2);Pain     Time: 1540-1606 PT Time Calculation (min) (ACUTE ONLY): 26 min  Charges:  $Therapeutic Exercise: 23-37 mins                    D. Scott Nikalas Bramel PT, DPT 11/27/22, 4:18 PM

## 2022-11-27 NOTE — Progress Notes (Signed)
PROGRESS NOTE    Ricardo Winters.  KWI:[id number] DOB: Aug 29, 1946 DOA: 11/21/2022 PCP: System, Provider Not In    Brief Narrative:  Ricardo Winters. is an 77 y.o. male with PMH of HTN, CAD, sCHF with EF 45%, who is admitted due to perforated duodenal ulcer on 1/24. Pt is 4 Days Post-Op s/p exploratory laparotomy. We are asked consult due to hypertensive urgency.   Patient normally takes Cozaar 100 mg daily at home.  Due to surgery, patient cannot take any oral pills.  Patient has been treated with as needed IV hydralazine after surgery.  Today patient was noted to have elevated blood pressure, up to 222/103.  Patient is asymptomatic.  Patient denies chest pain, cough, shortness breath, chest pain, palpitation.  Patient has mild abdominal pain in surgical site.  He has nausea, no vomiting or diarrhea.  No fever or chills.  No symptoms of UTI.  Transfer to stepdown and initiation of nicardipine infusion.  Seen by myself on 1/29.  Patient asymptomatic.  Blood pressure control improved on drip.  Discussed with general surgery.  Plan to wean off gtt.  Will initiate clonidine transdermal patch.  As needed IV hydralazine with parameters.  From a medicine standpoint if able to wean from Cardene gtt. patient does not need stepdown unit.  1/30: Improved BP control.  Off Cardene gtt.  Assessment & Plan:   Principal Problem:   Perforated duodenal ulcer ([place]) Active Problems:   Hypertensive urgency   HTN (hypertension)   Chronic systolic CHF (congestive heart failure) (HCC)   CAD (coronary artery disease)  Hypertensive urgency and history of hypertension: Current pressure much improved after initiation of Cardene gtt.  Home Cozaar on hold given strict n.p.o. status.  Patient asymptomatic.  Cardene gtt. weaned off Plan: Continue clonidine transdermal 0.1 mg every 72 hours Hydralazine 10 every 6 hours as needed SBP> 165 Okay for transfer to floor from medicine standpoint  Perforated  duodenal ulcer [facility]): Patient had a successful surgery. Now pt is 4 Days Post-Op s/p exploratory laparotomy. NG with bilious output.  JP drains with serous output.  Jejunostomy tube in place.  No fever.  No leukocytosis. Plan: -pt is on tube feeding with Osmolite -Antibiotics: Zosyn and Diflucan -On Lovenox 55 mg daily for prophylaxis -IV fluid: 50 cc/h of normal saline -IV Protonix -Octreotide 100 mcg 3 times daily -Pain control: As needed morphine, oxycodone -As needed Zofran -Management as per general surgery    Chronic systolic CHF (congestive heart failure) ([place]): 2D echo on 04/08/2020 showed EF of 45%.  Patient does not have leg edema JVD.  CHF seem to be compensated. -Watch volume status closely    CAD (coronary artery disease): No chest pain -Blood pressure control -Aspirin is on hold  Anxiety Related to continued hospitalization, NGT, throat irritation.   Valium as needed for symptoms of anxiety  Thank you for the consult.  [facility] hospitalist service to continue following this patient with you.  DVT prophylaxis: SQ Lovenox Code Status: Full Family Communication: None today Disposition Plan: Status is: Inpatient Remains inpatient appropriate because: Disposition per general surgery   Level of care: Med-Surg  Consultants:  Hospitalist  Procedures:  Per general surgery  Antimicrobials: Zosyn Diflucan   Subjective: Seen and examined.  Endorses more anxiety today  Objective: Vitals:   11/27/22 0800 11/27/22 0854 11/27/22 0917 11/27/22 1000  BP:  (!) 140/94 (!) 146/89 (!) 150/71  Pulse:   60 (!) 57  Resp:  19 19 13  Temp: 97.8 F (36.6 C)     TempSrc: Oral     SpO2: 94%  95% 96%  Weight:      Height:        Intake/Output Summary (Last 24 hours) at 11/27/2022 1119 Last data filed at 11/27/2022 1100 Gross per 24 hour  Intake 2923.5 ml  Output 1315 ml  Net 1608.5 ml   Filed Weights   11/24/22 0600 11/26/22 0500 11/27/22 0500  Weight: 110.7 kg 107.2  kg 105.7 kg    Examination:  General exam: NAD.  Mildly anxious Respiratory system: Clear to auscultation. Respiratory effort normal. Cardiovascular system: S1-S2, RRR, no murmurs, no pedal edema Gastrointestinal system: Midline surgical incision.  Wound CDI Central nervous system: Alert and oriented. No focal neurological deficits. Extremities: Symmetric 5 x 5 power. Skin: No rashes, lesions or ulcers Psychiatry: Judgement and insight appear normal. Mood & affect appropriate.     Data Reviewed: I have personally reviewed following labs and imaging studies  CBC: Recent Labs  Lab 11/21/22 1156 11/22/22 0454 11/23/22 0209 11/24/22 0528 11/25/22 0925 11/26/22 0313  WBC 17.8* 15.3* 13.6* 11.4* 10.8* 9.7  NEUTROABS 14.4*  --   --   --   --   --   HGB 15.5 14.8 14.1 13.7 14.9 15.0  HCT 44.3 42.6 40.8 40.3 43.3 42.4  MCV 94.5 94.0 95.8 96.6 94.7 92.8  PLT 171 151 139* 140* 159 737   Basic Metabolic Panel: Recent Labs  Lab 11/22/22 0454 11/23/22 0209 11/24/22 0528 11/25/22 0527 11/25/22 0925 11/26/22 0313 11/27/22 0744  NA 137 138 139  --  132* 137 135  K 4.0 4.1 3.6  --  3.5 3.5 3.4*  CL 105 107 105  --  99 101 106  CO2 '24 26 29  '$ --  '30 25 24  '$ GLUCOSE 163* 179* 116*  --  149* 171* 148*  BUN 25* 23 17  --  '14 16 19  '$ CREATININE 1.32* 1.17 1.02  --  0.80 0.85 0.93  CALCIUM 8.4* 7.9* 7.9*  --  8.2* 8.7* 8.1*  MG 2.0 2.3 2.2 2.0  --   --  2.3  PHOS 3.1 2.6 2.1* 3.0  --   --   --    GFR: Estimated Creatinine Clearance: 86.2 mL/min (by C-G formula based on SCr of 0.93 mg/dL). Liver Function Tests: Recent Labs  Lab 11/21/22 1156 11/22/22 0454 11/23/22 0209  AST 21 38 24  ALT 24 40 30  ALKPHOS 71 59 53  BILITOT 1.9* 2.8* 2.1*  PROT 6.7 6.1* 5.6*  ALBUMIN 4.0 3.3* 2.9*   Recent Labs  Lab 11/21/22 1156  LIPASE 42   No results for input(s): "AMMONIA" in the last 168 hours. Coagulation Profile: No results for input(s): "INR", "PROTIME" in the last 168  hours. Cardiac Enzymes: No results for input(s): "CKTOTAL", "CKMB", "CKMBINDEX", "TROPONINI" in the last 168 hours. BNP (last 3 results) No results for input(s): "PROBNP" in the last 8760 hours. HbA1C: No results for input(s): "HGBA1C" in the last 72 hours. CBG: Recent Labs  Lab 11/24/22 0838 11/25/22 1846  GLUCAP 144* 172*   Lipid Profile: No results for input(s): "CHOL", "HDL", "LDLCALC", "TRIG", "CHOLHDL", "LDLDIRECT" in the last 72 hours. Thyroid Function Tests: No results for input(s): "TSH", "T4TOTAL", "FREET4", "T3FREE", "THYROIDAB" in the last 72 hours. Anemia Panel: No results for input(s): "VITAMINB12", "FOLATE", "FERRITIN", "TIBC", "IRON", "RETICCTPCT" in the last 72 hours. Sepsis Labs: No results for input(s): "PROCALCITON", "LATICACIDVEN" in the last 168 hours.  Recent Results (from the past 240 hour(s))  MRSA Next Gen by PCR, Nasal     Status: None   Collection Time: 11/25/22  6:46 PM   Specimen: Nasal Mucosa; Nasal Swab  Result Value Ref Range Status   MRSA by PCR Next Gen NOT DETECTED NOT DETECTED Final    Comment: (NOTE) The GeneXpert MRSA Assay (FDA approved for NASAL specimens only), is one component of a comprehensive MRSA colonization surveillance program. It is not intended to diagnose MRSA infection nor to guide or monitor treatment for MRSA infections. Test performance is not FDA approved in patients less than 86 years old. Performed at [facility], [address], [place], [place] [address]          Radiology Studies: No results found.      Scheduled Meds:  Chlorhexidine Gluconate Cloth  6 each Topical Daily   cloNIDine  0.1 mg Transdermal Weekly   enoxaparin (LOVENOX) injection  0.5 mg/kg Subcutaneous Q24H   feeding supplement (PROSource TF20)  60 mL Per Tube Daily   fentaNYL  1 patch Transdermal Q72H   free water  30 mL Per Tube Q4H   lidocaine  2 patch Transdermal Q24H   octreotide  100 mcg Subcutaneous Q8H   mouth  rinse  15 mL Mouth Rinse 4 times per day   pantoprazole (PROTONIX) IV  40 mg Intravenous Q12H   Continuous Infusions:  sodium chloride 10 mL/hr at 11/27/22 1100   feeding supplement (OSMOLITE 1.5 CAL) 70 mL/hr at 11/27/22 1100   fluconazole (DIFLUCAN) IV Stopped (11/26/22 2240)   methocarbamol (ROBAXIN) IV Stopped (11/22/22 1239)   niCARDipine Stopped (11/26/22 1022)   piperacillin-tazobactam (ZOSYN)  IV Stopped (11/27/22 0907)     LOS: 6 days        Sidney Ace, MD [facility]   If 7PM-7AM, please contact night-coverage  11/27/2022, 11:19 AM

## 2022-11-27 NOTE — Progress Notes (Addendum)
[place] Hospital Day(s): 6.   Post op day(s): 6 Days Post-Op.   Interval History:  Patient seen and examined No issues overnight Blood pressure better; still hypertensive; most recent 155/78 Patient still anxious; wants UGI sooner Abdominal soreness; worse with movement No fever, chills, nausea, emesis Labs are pending this AM UO - 1300 ccs NGT in place; output 450 ccs Drains x2 in place  - RUQ: 65 ccs; serous  - LUQ: 0 ccs recorded She is NPO Does have jejunostomy; on TF at 40 ml/hr Continues on Zosyn  Plan for UGI tomorrow (01/31)  Vital signs in last 24 hours: [min-max] current  Temp:  [97.9 F (36.6 C)-99.2 F (37.3 C)] 98.5 F (36.9 C) (01/30 0400) Pulse Rate:  [57-83] 61 (01/30 0700) Resp:  [12-25] 14 (01/30 0700) BP: (126-197)/(60-106) 155/78 (01/30 0700) SpO2:  [93 %-98 %] 94 % (01/30 0700) Weight:  [105.7 kg] 105.7 kg (01/30 0500)     Height: 6' (182.9 cm) Weight: 105.7 kg BMI (Calculated): 31.6   Intake/Output last 2 shifts:  01/29 0701 - 01/30 0700 In: 3320.6 [I.V.:395.8; NG/GT:2048.8; IV Piggyback:875.9] Out: 1815 [Urine:1300; Emesis/NG output:450; Drains:65]   Physical Exam:  Constitutional: alert, cooperative and no distress  HEENT: NGT in place Respiratory: breathing non-labored at rest  Cardiovascular: regular rate and sinus rhythm  Gastrointestinal: Soft, incisional soreness, non-distended, no rebound/guarding. Feeding jejunostomy in left abdomen. RUQ drain and LUQ drain present; both now serous  Integumentary: Laparotomy is CDI with staples, no erythema   Labs:     Latest Ref Rng & Units 11/26/2022    3:13 AM 11/25/2022    9:25 AM 11/24/2022    5:28 AM  CBC  WBC 4.0 - 10.5 K/uL 9.7  10.8  11.4   Hemoglobin 13.0 - 17.0 g/dL 15.0  14.9  13.7   Hematocrit 39.0 - 52.0 % 42.4  43.3  40.3   Platelets 150 - 400 K/uL 169  159  140       Latest Ref Rng & Units 11/26/2022    3:13 AM 11/25/2022    9:25 AM  11/24/2022    5:28 AM  CMP  Glucose 70 - 99 mg/dL 171  149  116   BUN 8 - 23 mg/dL '16  14  17   '$ Creatinine 0.61 - 1.24 mg/dL 0.85  0.80  1.02   Sodium 135 - 145 mmol/L 137  132  139   Potassium 3.5 - 5.1 mmol/L 3.5  3.5  3.6   Chloride 98 - 111 mmol/L 101  99  105   CO2 22 - 32 mmol/L '25  30  29   '$ Calcium 8.9 - 10.3 mg/dL 8.7  8.2  7.9     Imaging studies: No new pertinent imaging studies   Assessment/Plan: 77 y.o. male 6 Days Post-Op s/p exploratory laparotomy, placement of feeding jejunostomy, and reinforcement of contained perforated duodenal ulcer with purulent fluid in retroperitoneum.   - Will plan for UGI on Wednesday (01/31) to reassess repair and ensure no leaks prior to considering NGT removal and initiation of diet.    - Anxiolytic prn; does have Valium IV currently; consider adding or switching to Ativan Liquid via jejunostomy if needed.   - Appreciate medicine assistance with HTN - Continue NPO with NGT decompression for now  - Okay to continue TF via jejunostomy; NO PILLS via jejunostomy   - Continue IV Abx (Zosyn)  - Continue IV PPI BID  - Continue IVF  resuscitation; monitor renal function  - Continue surgical drains x2; monitor and record output   - Monitor abdominal examination; on-going bowel function - Pain control prn; IV Tylenol, Morphine, Robaxin. We are limited secondary to need to be NPO for extended time.   - Antiemetics prn    - Okay to mobilize when feasible  - No longer needing IV antihypertensive, okay to transfer back to floor   All of the above findings and recommendations were discussed with the patient, and the medical team, and all of patient's questions were answered to his expressed satisfaction.  -- Edison Simon, PA-C [facility] 11/27/2022, 7:14 AM M-F: 7am - 4pm

## 2022-11-28 ENCOUNTER — Inpatient Hospital Stay: Payer: PPO

## 2022-11-28 DIAGNOSIS — K265 Chronic or unspecified duodenal ulcer with perforation: Secondary | ICD-10-CM | POA: Diagnosis not present

## 2022-11-28 LAB — BASIC METABOLIC PANEL
Anion gap: 6 (ref 5–15)
BUN: 19 mg/dL (ref 8–23)
CO2: 27 mmol/L (ref 22–32)
Calcium: 8.5 mg/dL — ABNORMAL LOW (ref 8.9–10.3)
Chloride: 105 mmol/L (ref 98–111)
Creatinine, Ser: 1.02 mg/dL (ref 0.61–1.24)
GFR, Estimated: >60 mL/min (ref >60)
Glucose, Bld: 124 mg/dL — ABNORMAL HIGH (ref 70–99)
Potassium: 4 mmol/L (ref 3.5–5.1)
Sodium: 138 mmol/L (ref 135–145)

## 2022-11-28 LAB — CBC
HCT: 42 % (ref 39.0–52.0)
Hemoglobin: 14.5 g/dL (ref 13.0–17.0)
MCH: 32.8 pg (ref 26.0–34.0)
MCHC: 34.5 g/dL (ref 30.0–36.0)
MCV: 95 fL (ref 80.0–100.0)
Platelets: 211 10*3/uL (ref 150–400)
RBC: 4.42 MIL/uL (ref 4.22–5.81)
RDW: 12.7 % (ref 11.5–15.5)
WBC: 8 10*3/uL (ref 4.0–10.5)
nRBC: 0 % (ref 0.0–0.2)

## 2022-11-28 LAB — GLUCOSE, CAPILLARY
Glucose-Capillary: 135 mg/dL — ABNORMAL HIGH (ref 70–99)
Glucose-Capillary: 142 mg/dL — ABNORMAL HIGH (ref 70–99)
Glucose-Capillary: 151 mg/dL — ABNORMAL HIGH (ref 70–99)
Glucose-Capillary: 91 mg/dL (ref 70–99)
Glucose-Capillary: 121 mg/dL — ABNORMAL HIGH (ref 70–99)
Glucose-Capillary: 133 mg/dL — ABNORMAL HIGH (ref 70–99)

## 2022-11-28 LAB — MAGNESIUM: Magnesium: 2.3 mg/dL (ref 1.7–2.4)

## 2022-11-28 MED ORDER — METHOCARBAMOL 500 MG PO TABS
500.0000 mg | ORAL_TABLET | Freq: Three times a day (TID) | ORAL | Status: DC
  Administered 2022-11-29 – 2022-12-01 (×6): 500 mg via ORAL
  Filled 2022-11-28 (×10): qty 1

## 2022-11-28 MED ORDER — ACETAMINOPHEN 325 MG PO TABS: 650.0000 mg | ORAL_TABLET | Freq: Four times a day (QID) | ORAL | Status: DC | PRN

## 2022-11-28 MED ORDER — OXYCODONE HCL 5 MG PO TABS: 5.0000 mg | ORAL_TABLET | ORAL | Status: DC | PRN

## 2022-11-28 MED ORDER — MORPHINE SULFATE (PF) 2 MG/ML IV SOLN: 2.0000 mg | INTRAVENOUS | Status: DC | PRN

## 2022-11-28 MED ORDER — LOSARTAN POTASSIUM 50 MG PO TABS
100.0000 mg | ORAL_TABLET | Freq: Every day | ORAL | Status: DC
  Administered 2022-11-29 – 2022-12-04 (×6): 100 mg via ORAL
  Filled 2022-11-28 (×7): qty 2

## 2022-11-28 MED ORDER — IOHEXOL 300 MG/ML  SOLN
150.0000 mL | Freq: Once | INTRAMUSCULAR | Status: AC | PRN
  Administered 2022-11-28: 125 mL via ORAL

## 2022-11-28 MED ORDER — LOPERAMIDE HCL 2 MG PO CAPS
2.0000 mg | ORAL_CAPSULE | Freq: Two times a day (BID) | ORAL | Status: DC | PRN
  Administered 2022-11-28: 2 mg via ORAL
  Filled 2022-11-28: qty 1

## 2022-11-28 NOTE — Progress Notes (Signed)
[place] Hospital Day(s): 7.   Post op day(s): 7 Days Post-Op.   Interval History:  Patient seen and examined No issues overnight Blood pressure better; still hypertensive; most recent 177/90 Patient doing better this morning; had pain around LUQ drain last night No fever, chills, nausea, emesis Labs are very reassuring this AM UO - 840 ccs NGT in place; output 90 ccs Drains x2 in place  - RUQ: 20 ccs; serous  - LUQ: 10 ccs; this is thicker serous fluid She is NPO Does have jejunostomy; on TF (75 ml/hr) Continues on Zosyn  Plan for UGI today (01/31)  Vital signs in last 24 hours: [min-max] current  Temp:  [97.7 F (36.5 C)-98.7 F (37.1 C)] 98.5 F (36.9 C) (01/31 0416) Pulse Rate:  [57-71] 64 (01/31 0416) Resp:  [13-21] 20 (01/31 0416) BP: (140-177)/(71-102) 177/90 (01/31 0416) SpO2:  [94 %-97 %] 96 % (01/31 0416) Weight:  [105.7 kg] 105.7 kg (01/31 0500)     Height: 6' (182.9 cm) Weight: 105.7 kg BMI (Calculated): 31.6   Intake/Output last 2 shifts:  01/30 0701 - 01/31 0700 In: 2468.4 [I.V.:470; NG/GT:1397.7; IV Piggyback:600.8] Out: 960 [Urine:840; Emesis/NG output:90; Drains:30]   Physical Exam:  Constitutional: alert, cooperative and no distress  HEENT: NGT in place Respiratory: breathing non-labored at rest  Cardiovascular: regular rate and sinus rhythm  Gastrointestinal: Soft, incisional soreness, non-distended, no rebound/guarding. Feeding jejunostomy in left abdomen. RUQ drain and LUQ drain present; both now serous  Integumentary: Laparotomy is CDI with staples, no erythema   Labs:     Latest Ref Rng & Units 11/28/2022    5:35 AM 11/26/2022    3:13 AM 11/25/2022    9:25 AM  CBC  WBC 4.0 - 10.5 K/uL 8.0  9.7  10.8   Hemoglobin 13.0 - 17.0 g/dL 14.5  15.0  14.9   Hematocrit 39.0 - 52.0 % 42.0  42.4  43.3   Platelets 150 - 400 K/uL 211  169  159       Latest Ref Rng & Units 11/28/2022    5:35 AM 11/27/2022     7:44 AM 11/26/2022    3:13 AM  CMP  Glucose 70 - 99 mg/dL 124  148  171   BUN 8 - 23 mg/dL '19  19  16   '$ Creatinine 0.61 - 1.24 mg/dL 1.02  0.93  0.85   Sodium 135 - 145 mmol/L 138  135  137   Potassium 3.5 - 5.1 mmol/L 4.0  3.4  3.5   Chloride 98 - 111 mmol/L 105  106  101   CO2 22 - 32 mmol/L '27  24  25   '$ Calcium 8.9 - 10.3 mg/dL 8.5  8.1  8.7     Imaging studies: No new pertinent imaging studies   Assessment/Plan: 77 y.o. male 7 Days Post-Op s/p exploratory laparotomy, placement of feeding jejunostomy, and reinforcement of contained perforated duodenal ulcer with purulent fluid in retroperitoneum.   - Will plan for UGI on this morning to reassess repair and ensure no leaks prior to considering NGT removal and initiation of diet.    - Anxiolytic prn; does have Valium IV currently; consider adding or switching to Ativan Liquid via jejunostomy if needed.   - Appreciate medicine assistance with HTN - Continue NPO with NGT decompression for now; pending UGI today   - Okay to continue TF via jejunostomy; NO PILLS via jejunostomy   - Continue IV Abx (Zosyn)  -  Continue IV PPI BID  - Continue IVF resuscitation; monitor renal function  - Continue surgical drains x2; monitor and record output   - Monitor abdominal examination; on-going bowel function - Pain control prn; IV Tylenol, Morphine, Robaxin. We are limited secondary to need to be NPO for extended time.   - Antiemetics prn    - Okay to mobilize; therapies on board; recommendations are HHPT  All of the above findings and recommendations were discussed with the patient, and the medical team, and all of patient's questions were answered to his expressed satisfaction.  -- Ricardo Simon, PA-C [facility] 11/28/2022, 7:38 AM M-F: 7am - 4pm

## 2022-11-28 NOTE — Plan of Care (Signed)

## 2022-11-28 NOTE — Care Management Important Message (Signed)
Important Message  Patient Details  Name: Ricardo Winters. MRN: [id number] Date of Birth: 21-Jun-1946   Medicare Important Message Given:  Yes     Dannette Barbara 11/28/2022, 12:23 PM

## 2022-11-28 NOTE — Progress Notes (Signed)
Brief Progress Note Reviewed UGI; no evidence of contrast extravasation to suggest leak. There is narrowing of second portion of the duodenum likely secondary to post-operative changes/edema.  Report reviewed below and did discuss on telephone with radiology.   IMPRESSION: No clear evidence of postoperative leak. Possible edematous changes causing mid duodenal narrowing.   Plan:  -- Remove NGT -- Okay to start CLD cautiously -- Continue tube feeds for now; wean as we can advance his diet -- Will restart home antihypertensive -- Okay for PO medications as well  -- Edison Simon, PA-C [facility] 11/28/2022, 3:22 PM M-F: 7am - 4pm

## 2022-11-28 NOTE — Progress Notes (Signed)
Inpatient Consult PROGRESS NOTE   Ricardo Winters.  FXT:[id number] DOB: 20-Jun-1946 DOA: 11/21/2022 PCP: System, Provider Not In   Brief Narrative:  Ricardo Winters. is an 77 y.o. male with PMH of HTN, CAD, sCHF with EF 45%, who is admitted due to perforated duodenal ulcer on 1/24. Pt is 4 Days Post-Op s/p exploratory laparotomy. We are asked consult due to hypertensive urgency.   Patient normally takes Cozaar 100 mg daily at home.  Due to surgery, patient cannot take any oral pills.  Patient has been treated with as needed IV hydralazine after surgery.  Today patient was noted to have elevated blood pressure, up to 222/103.  Patient is asymptomatic.  Patient denies chest pain, cough, shortness breath, chest pain, palpitation.  Patient has mild abdominal pain in surgical site.  He has nausea, no vomiting or diarrhea.  No fever or chills.  No symptoms of UTI.  Transfer to stepdown and initiation of nicardipine infusion.  Patient asymptomatic.  Blood pressure control improved on drip.  Discussed with general surgery.  Now off gtt.  BP improved with clonidine transdermal patch.  As needed IV hydralazine with parameters.  Transferred to floor and BP doing well off Cardene.  Assessment & Plan:   Principal Problem:   Perforated duodenal ulcer ([place]) Active Problems:   Hypertensive urgency   HTN (hypertension)   Chronic systolic CHF (congestive heart failure) (HCC)   CAD (coronary artery disease)  Hypertensive urgency and history of hypertension: Current pressure much improved now off Cardene gtt.  Home Cozaar on hold given strict n.p.o. status.  Patient asymptomatic.   Plan: Continue clonidine transdermal 0.1 mg every 72 hours Hydralazine 10 every 6 hours as needed SBP> 165  Perforated duodenal ulcer [facility]): Patient had a successful surgery. Now pt is 4 Days Post-Op s/p exploratory laparotomy. NG with bilious output.  JP drains with serous output.  Jejunostomy tube in place.  No fever.   No leukocytosis. Plan: -pt is on tube feeding with Osmolite -Antibiotics: Zosyn and Diflucan -IV fluid: 50 cc/h of normal saline -IV Protonix -Octreotide 100 mcg 3 times daily -Pain control: As needed morphine, oxycodone -As needed Zofran -Management as per general surgery   Chronic systolic CHF (congestive heart failure) ([place]): 2D echo on 04/08/2020 showed EF of 45%.  Patient does not have leg edema JVD.  CHF seem to be compensated, no evidence of exacerbation. -Watch volume status closely   CAD (coronary artery disease): No chest pain -Blood pressure control -Aspirin is on hold  Anxiety Related to continued hospitalization, NGT, throat irritation.   Valium as needed for symptoms of anxiety  Thank you for the consult.  [facility] hospitalist service to continue following this patient with you.  DVT prophylaxis: SQ Lovenox Code Status: Full Family Communication: None present today Disposition Plan: Status is: Inpatient, per primary team.  Level of care: Med-Surg  Consultants:  Hospitalist  Procedures:  Per general surgery  Antimicrobials: Zosyn Diflucan  Subjective: Seen this AM, on room air, NGT in place. Has no complaints and is awaiting UGI series.  Objective: Vitals:   11/27/22 2150 11/28/22 0416 11/28/22 0500 11/28/22 0807  BP: (!) 166/83 (!) 177/90  (!) 158/92  Pulse: 64 64  65  Resp: '20 20  17  '$ Temp: 97.7 F (36.5 C) 98.5 F (36.9 C)  97.6 F (36.4 C)  TempSrc:    Oral  SpO2: 94% 96%  93%  Weight:   105.7 kg   Height:  Intake/Output Summary (Last 24 hours) at 11/28/2022 1011 Last data filed at 11/28/2022 0558 Gross per 24 hour  Intake 2468.43 ml  Output 960 ml  Net 1508.43 ml    Filed Weights   11/26/22 0500 11/27/22 0500 11/28/22 0500  Weight: 107.2 kg 105.7 kg 105.7 kg    Examination: General:  Alert, oriented, calm, in no acute distress, does not seem anxious today, NGT in place Eyes: EOMI, clear conjuctivae, white sclerea Neck:  supple, no masses, trachea mildline  Cardiovascular: RRR, no murmurs or rubs, no peripheral edema  Respiratory: clear to auscultation bilaterally, no wheezes, no crackles  Abdomen: soft, minimally tender, midline incision Skin: dry, no rashes  Musculoskeletal: no joint effusions, normal range of motion  Psychiatric: appropriate affect, normal speech  Neurologic: extraocular muscles intact, clear speech, moving all extremities with intact sensorium  Data Reviewed: I have personally reviewed following labs and imaging studies  CBC: Recent Labs  Lab 11/21/22 1156 11/22/22 0454 11/23/22 0209 11/24/22 0528 11/25/22 0925 11/26/22 0313 11/28/22 0535  WBC 17.8*   < > 13.6* 11.4* 10.8* 9.7 8.0  NEUTROABS 14.4*  --   --   --   --   --   --   HGB 15.5   < > 14.1 13.7 14.9 15.0 14.5  HCT 44.3   < > 40.8 40.3 43.3 42.4 42.0  MCV 94.5   < > 95.8 96.6 94.7 92.8 95.0  PLT 171   < > 139* 140* 159 169 211   < > = values in this interval not displayed.    Basic Metabolic Panel: Recent Labs  Lab 11/22/22 0454 11/23/22 0209 11/24/22 0528 11/25/22 0527 11/25/22 0925 11/26/22 0313 11/27/22 0744 11/28/22 0535  NA 137 138 139  --  132* 137 135 138  K 4.0 4.1 3.6  --  3.5 3.5 3.4* 4.0  CL 105 107 105  --  99 101 106 105  CO2 '24 26 29  '$ --  '30 25 24 27  '$ GLUCOSE 163* 179* 116*  --  149* 171* 148* 124*  BUN 25* 23 17  --  '14 16 19 19  '$ CREATININE 1.32* 1.17 1.02  --  0.80 0.85 0.93 1.02  CALCIUM 8.4* 7.9* 7.9*  --  8.2* 8.7* 8.1* 8.5*  MG 2.0 2.3 2.2 2.0  --   --  2.3 2.3  PHOS 3.1 2.6 2.1* 3.0  --   --   --   --     GFR: Estimated Creatinine Clearance: 78.6 mL/min (by C-G formula based on SCr of 1.02 mg/dL). Liver Function Tests: Recent Labs  Lab 11/21/22 1156 11/22/22 0454 11/23/22 0209  AST 21 38 24  ALT 24 40 30  ALKPHOS 71 59 53  BILITOT 1.9* 2.8* 2.1*  PROT 6.7 6.1* 5.6*  ALBUMIN 4.0 3.3* 2.9*    Recent Labs  Lab 11/21/22 1156  LIPASE 42    No results for input(s):  "AMMONIA" in the last 168 hours. Coagulation Profile: No results for input(s): "INR", "PROTIME" in the last 168 hours. Cardiac Enzymes: No results for input(s): "CKTOTAL", "CKMB", "CKMBINDEX", "TROPONINI" in the last 168 hours. BNP (last 3 results) No results for input(s): "PROBNP" in the last 8760 hours. HbA1C: No results for input(s): "HGBA1C" in the last 72 hours. CBG: Recent Labs  Lab 11/27/22 1956 11/27/22 2203 11/27/22 2336 11/28/22 0414 11/28/22 0810  GLUCAP 179* 132* 131* 135* 142*    Lipid Profile: No results for input(s): "CHOL", "HDL", "LDLCALC", "TRIG", "CHOLHDL", "LDLDIRECT" in  the last 72 hours. Thyroid Function Tests: No results for input(s): "TSH", "T4TOTAL", "FREET4", "T3FREE", "THYROIDAB" in the last 72 hours. Anemia Panel: No results for input(s): "VITAMINB12", "FOLATE", "FERRITIN", "TIBC", "IRON", "RETICCTPCT" in the last 72 hours. Sepsis Labs: No results for input(s): "PROCALCITON", "LATICACIDVEN" in the last 168 hours.  Recent Results (from the past 240 hour(s))  MRSA Next Gen by PCR, Nasal     Status: None   Collection Time: 11/25/22  6:46 PM   Specimen: Nasal Mucosa; Nasal Swab  Result Value Ref Range Status   MRSA by PCR Next Gen NOT DETECTED NOT DETECTED Final    Comment: (NOTE) The GeneXpert MRSA Assay (FDA approved for NASAL specimens only), is one component of a comprehensive MRSA colonization surveillance program. It is not intended to diagnose MRSA infection nor to guide or monitor treatment for MRSA infections. Test performance is not FDA approved in patients less than 99 years old. Performed at [facility], [address] [place], [place] [address]     Radiology Studies: No results found.  Scheduled Meds:  Chlorhexidine Gluconate Cloth  6 each Topical Daily   cloNIDine  0.1 mg Transdermal Weekly   enoxaparin (LOVENOX) injection  0.5 mg/kg Subcutaneous Q24H   fentaNYL  1 patch Transdermal Q72H   free water  30 mL Per  Tube Q4H   insulin aspart  0-9 Units Subcutaneous Q4H   lidocaine  2 patch Transdermal Q24H   octreotide  100 mcg Subcutaneous Q8H   mouth rinse  15 mL Mouth Rinse 4 times per day   pantoprazole (PROTONIX) IV  40 mg Intravenous Q12H   Continuous Infusions:  sodium chloride 10 mL/hr at 11/28/22 0539   feeding supplement (OSMOLITE 1.5 CAL) 70 mL/hr at 11/28/22 0558   fluconazole (DIFLUCAN) IV Stopped (11/27/22 1903)   methocarbamol (ROBAXIN) IV Stopped (11/22/22 1239)   niCARDipine Stopped (11/26/22 1022)   piperacillin-tazobactam (ZOSYN)  IV 3.375 g (11/28/22 0537)    LOS: 7 days   Delonte Musich Neva Seat, MD [facility]  If 7PM-7AM, please contact night-coverage  11/28/2022, 10:11 AM

## 2022-11-29 ENCOUNTER — Ambulatory Visit: Payer: PPO | Admitting: Dermatology

## 2022-11-29 DIAGNOSIS — K265 Chronic or unspecified duodenal ulcer with perforation: Secondary | ICD-10-CM | POA: Diagnosis not present

## 2022-11-29 LAB — GLUCOSE, CAPILLARY
Glucose-Capillary: 110 mg/dL — ABNORMAL HIGH (ref 70–99)
Glucose-Capillary: 111 mg/dL — ABNORMAL HIGH (ref 70–99)
Glucose-Capillary: 87 mg/dL (ref 70–99)
Glucose-Capillary: 91 mg/dL (ref 70–99)
Glucose-Capillary: 93 mg/dL (ref 70–99)
Glucose-Capillary: 96 mg/dL (ref 70–99)

## 2022-11-29 LAB — BASIC METABOLIC PANEL
Anion gap: 7 (ref 5–15)
BUN: 22 mg/dL (ref 8–23)
CO2: 26 mmol/L (ref 22–32)
Calcium: 8.3 mg/dL — ABNORMAL LOW (ref 8.9–10.3)
Chloride: 105 mmol/L (ref 98–111)
Creatinine, Ser: 1.04 mg/dL (ref 0.61–1.24)
GFR, Estimated: >60 mL/min (ref >60)
Glucose, Bld: 113 mg/dL — ABNORMAL HIGH (ref 70–99)
Potassium: 4.2 mmol/L (ref 3.5–5.1)
Sodium: 138 mmol/L (ref 135–145)

## 2022-11-29 LAB — CBC
HCT: 40.1 % (ref 39.0–52.0)
Hemoglobin: 13.7 g/dL (ref 13.0–17.0)
MCH: 32.7 pg (ref 26.0–34.0)
MCHC: 34.2 g/dL (ref 30.0–36.0)
MCV: 95.7 fL (ref 80.0–100.0)
Platelets: 240 10*3/uL (ref 150–400)
RBC: 4.19 MIL/uL — ABNORMAL LOW (ref 4.22–5.81)
RDW: 12.7 % (ref 11.5–15.5)
WBC: 8.6 10*3/uL (ref 4.0–10.5)
nRBC: 0 % (ref 0.0–0.2)

## 2022-11-29 MED ORDER — OSMOLITE 1.5 CAL PO LIQD
1000.0000 mL | ORAL | Status: DC
  Administered 2022-11-29 – 2022-11-30 (×2): 1000 mL

## 2022-11-29 MED ORDER — PIPERACILLIN-TAZOBACTAM 3.375 G IVPB
3.3750 g | Freq: Three times a day (TID) | INTRAVENOUS | Status: DC
  Administered 2022-11-29 – 2022-12-04 (×15): 3.375 g via INTRAVENOUS
  Filled 2022-11-29 (×16): qty 50

## 2022-11-29 NOTE — Progress Notes (Signed)
Physical Therapy Treatment Patient Details Name: Ricardo Winters. MRN: [id number] DOB: Jun 01, 1946 Today's Date: 11/29/2022   History of Present Illness Pt is a 77 y/o M admitted on 11/21/22 after presenting with RLQ pain since the day prior. CT Abdomen/Pelvis was concerning for possible perforated duodenal ulcer & general sx was consulted. Pt underwent exploratory laparotomy, placement of feeding jejunostomy, & reinforcement of contained perforated duodenal ulcer with purulent fluid in retroperitoneum on 11/21/22. PMH: lumbar spondylosis, chronic pain syndrome, lumbar radiculopathy, chronic SI joint pain, syncope, HTN, CAD, HLD, MI.    PT Comments    Pt was long sitting in bed upon arrival. He is A an O x 4 and agreeable to session. Overall demonstrates improving strength, balance, and safety. He tolerated ambulation with RW x 100 ft prior to ambulating ~ 100 ft more without AD. Author recommends he continue to use AD until his mobility returns to PLOF. Pt will continue to benefit from skilled PT to maximize his independence while assisting him to PLOF.     Recommendations for follow up therapy are one component of a multi-disciplinary discharge planning process, led by the attending physician.  Recommendations may be updated based on patient status, additional functional criteria and insurance authorization.  Follow Up Recommendations  Home health PT     Assistance Recommended at Discharge Intermittent Supervision/Assistance  Patient can return home with the following A little help with walking and/or transfers;A little help with bathing/dressing/bathroom;Assistance with cooking/housework;Assist for transportation;Help with stairs or ramp for entrance   Equipment Recommendations  None recommended by PT       Precautions / Restrictions Precautions Precautions: Fall Precaution Comments: log roll for abdominal comfort Restrictions Weight Bearing Restrictions: No     Mobility  Bed  Mobility Overal bed mobility: Needs Assistance Bed Mobility: Rolling, Sidelying to Sit Rolling: Supervision Sidelying to sit: Supervision  General bed mobility comments: increased time to perform however no physical assistance required    Transfers Overall transfer level: Needs assistance Equipment used: Rolling walker (2 wheels) Transfers: Sit to/from Stand Sit to Stand: Min guard, Supervision  General transfer comment: CGA on first tranfer however progressed to supervision only next 2 x STS    Ambulation/Gait Ambulation/Gait assistance: Min guard, Supervision Gait Distance (Feet): 200 Feet Assistive device: None, Rolling walker (2 wheels) Gait Pattern/deviations: Step-through pattern Gait velocity: decreased     General Gait Details: Pt ambulated 1 x 100 ft with RW and then 1 x 100 ft without AD. Recommend use of RW until blanace and strength returns to PLOF.    Balance Overall balance assessment: Needs assistance Sitting-balance support: Feet supported Sitting balance-Leahy Scale: Good     Standing balance support: Bilateral upper extremity supported, During functional activity, Reliant on assistive device for balance Standing balance-Leahy Scale: Fair Standing balance comment: good with AD/UE support, fair without UE support      Cognition Arousal/Alertness: Awake/alert Behavior During Therapy: WFL for tasks assessed/performed Overall Cognitive Status: Within Functional Limits for tasks assessed          General Comments General comments (skin integrity, edema, etc.): reviewed previously issued HEP exercises and encouraged pt to continue to perform      Pertinent Vitals/Pain Pain Assessment Pain Assessment: 0-10 Pain Score: 3  Pain Location: abdomen Pain Descriptors / Indicators: Discomfort Pain Intervention(s): Limited activity within patient's tolerance, Monitored during session, Premedicated before session, Repositioned     PT Goals (current goals can  now be found in the care plan section) Acute Rehab  PT Goals Patient Stated Goal: get better so I can play golf again Progress towards PT goals: Progressing toward goals    Frequency    Min 2X/week      PT Plan Current plan remains appropriate       AM-PAC PT "6 Clicks" Mobility   Outcome Measure  Help needed turning from your back to your side while in a flat bed without using bedrails?: A Little Help needed moving from lying on your back to sitting on the side of a flat bed without using bedrails?: A Little Help needed moving to and from a bed to a chair (including a wheelchair)?: A Little Help needed standing up from a chair using your arms (e.g., wheelchair or bedside chair)?: A Little Help needed to walk in hospital room?: A Little Help needed climbing 3-5 steps with a railing? : A Little 6 Click Score: 18    End of Session   Activity Tolerance: Patient tolerated treatment well Patient left: in bed;with call bell/phone within reach;with bed alarm set Nurse Communication: Mobility status PT Visit Diagnosis: Muscle weakness (generalized) (M62.81);Difficulty in walking, not elsewhere classified (R26.2);Pain     Time: 1530-1546 PT Time Calculation (min) (ACUTE ONLY): 16 min  Charges:  $Gait Training: 8-22 mins                    Julaine Fusi PTA 11/29/22, 5:00 PM

## 2022-11-29 NOTE — Progress Notes (Addendum)
[place] Hospital Day(s): 8.   Post op day(s): 8 Days Post-Op.   Interval History:  Patient seen and examined No issues overnight BP better after restarting losartan; 139/94 Patient reports he is overall feeling much better after removal of NGT Does have expected incisional soreness; question of some bloating/fullness with CLD but he thinks it is from TF No fever, chills, nausea, emesis Labs are very reassuring this AM UO - 300 ccs Drains x2 in place  - RUQ: 20 ccs; serous  - LUQ: 10 ccs; this does appear more seropurulent  She is NPO Does have jejunostomy; on TF (75 ml/hr) - having significant diarrhea  Continues on Zosyn; Fluconazole  UGI (01/31) without leak; now on CLD  Vital signs in last 24 hours: [min-max] current  Temp:  [97.6 F (36.4 C)-98.9 F (37.2 C)] 98.4 F (36.9 C) (02/01 0400) Pulse Rate:  [62-66] 62 (02/01 0400) Resp:  [16-18] 18 (02/01 0400) BP: (139-158)/(86-94) 139/94 (02/01 0400) SpO2:  [93 %-98 %] 98 % (02/01 0400) Weight:  [104.9 kg] 104.9 kg (02/01 0343)     Height: 6' (182.9 cm) Weight: 104.9 kg BMI (Calculated): 31.37   Intake/Output last 2 shifts:  01/31 0701 - 02/01 0700 In: 2641.9 [P.O.:480; I.V.:105.3; NG/GT:1904; IV Piggyback:152.6] Out: 430 [Urine:300; Emesis/NG output:100; Drains:30]   Physical Exam:  Constitutional: alert, cooperative and no distress  Respiratory: breathing non-labored at rest  Cardiovascular: regular rate and sinus rhythm  Gastrointestinal: Soft, incisional soreness, non-distended, no rebound/guarding. Feeding jejunostomy in left abdomen. RUQ drain; serosanguinous. LUQ drain; seropurulent  Integumentary: Laparotomy is CDI with staples, no erythema   Labs:     Latest Ref Rng & Units 11/29/2022    6:26 AM 11/28/2022    5:35 AM 11/26/2022    3:13 AM  CBC  WBC 4.0 - 10.5 K/uL 8.6  8.0  9.7   Hemoglobin 13.0 - 17.0 g/dL 13.7  14.5  15.0   Hematocrit 39.0 - 52.0 % 40.1  42.0   42.4   Platelets 150 - 400 K/uL 240  211  169       Latest Ref Rng & Units 11/29/2022    6:26 AM 11/28/2022    5:35 AM 11/27/2022    7:44 AM  CMP  Glucose 70 - 99 mg/dL 113  124  148   BUN 8 - 23 mg/dL '22  19  19   '$ Creatinine 0.61 - 1.24 mg/dL 1.04  1.02  0.93   Sodium 135 - 145 mmol/L 138  138  135   Potassium 3.5 - 5.1 mmol/L 4.2  4.0  3.4   Chloride 98 - 111 mmol/L 105  105  106   CO2 22 - 32 mmol/L '26  27  24   '$ Calcium 8.9 - 10.3 mg/dL 8.3  8.5  8.1     Imaging studies: No new pertinent imaging studies   Assessment/Plan: 77 y.o. male 8 Days Post-Op s/p exploratory laparotomy, placement of feeding jejunostomy, and reinforcement of contained perforated duodenal ulcer with purulent fluid in retroperitoneum.   - Continue CLD for now; if fullness/bloating improve can consider FLD. He does likely have significant post-operative edema to the duodenum which may be contributing to his symptoms   - Will wean TF to 35 ml/hr today to help with diarrhea and hopefully his bloating as well; NO PILLS via jejunostomy   - Imodium for diarrhea added last night   - Appreciate medicine assistance with HTN; improving  - Continue  IV Abx (Zosyn); IV Antifungal  - Continue IV PPI BID  - Continue IVF resuscitation; monitor renal function  - Continue surgical drains x2; monitor and record output    - LUQ drain now more seropurulent. Discussed potentially repeating CT to evaluate for abscess however not sure this will change clinical course at this time. If this is a developing abscess suspect it is quite retro-peritoneal which will not allow it to be approached percutaneously, also current drain likely in adequate position given output.    - RUQ serosanguinous; output has been <20 ml daily; we may be able to remove this in a few days.   - Monitor abdominal examination; on-going bowel function - Pain control prn; transitioned to PO - Antiemetics prn  - Okay to mobilize; therapies on board; recommendations  are HHPT   - Discharge Planning: Thankfully able to get NGT out after reassuring UGI (01/31). Will be cautious and slow with diet advancement. Suspect he may be able to go home towards the end of the weekend vs early next week pending progress. He understands we will take or time.   All of the above findings and recommendations were discussed with the patient, and the medical team, and all of patient's questions were answered to his expressed satisfaction.  -- Edison Simon, PA-C [facility] 11/29/2022, 7:35 AM M-F: 7am - 4pm

## 2022-11-29 NOTE — Progress Notes (Signed)
Inpatient Consult PROGRESS NOTE   Ricardo Winters.  ALP:[id number] DOB: 03/26/1946 DOA: 11/21/2022 PCP: System, Provider Not In   Brief Narrative:  Ricardo Winters. is an 77 y.o. male with PMH of HTN, CAD, sCHF with EF 45%, who is admitted due to perforated duodenal ulcer on 1/24. Pt is 4 Days Post-Op s/p exploratory laparotomy. We are asked consult due to hypertensive urgency.   Patient normally takes Cozaar 100 mg daily at home.  Due to surgery, patient cannot take any oral pills.  Patient has been treated with as needed IV hydralazine after surgery.  Today patient was noted to have elevated blood pressure, up to 222/103.  Patient is asymptomatic.  Patient denies chest pain, cough, shortness breath, chest pain, palpitation.  Patient has mild abdominal pain in surgical site.  He has nausea, no vomiting or diarrhea.  No fever or chills.  No symptoms of UTI.  Transfer to stepdown and initiation of nicardipine infusion.  Patient asymptomatic.  Blood pressure control improved on drip.  Discussed with general surgery.  Now off gtt.  BP improved with clonidine transdermal patch.  As needed IV hydralazine with parameters.  Transferred to floor and BP doing well off Cardene.  Had upper GI series without serious complication on 3/53, NG tube was removed and patient has been tolerating clear liquid diet.  Assessment & Plan:   Principal Problem:   Perforated duodenal ulcer ([place]) Active Problems:   Hypertensive urgency   HTN (hypertension)   Chronic systolic CHF (congestive heart failure) (HCC)   CAD (coronary artery disease)  Hypertensive urgency and history of hypertension: Current pressure much improved now on his home Cozaar dose, clonidine patch has been discontinued.  He hydralazine is available for uncontrolled hypertension.  Perforated duodenal ulcer [facility]): Patient had a successful surgery.  Status post exploratory laparotomy.  NG tube removed. Jejunostomy tube in place.  No fever.  No  leukocytosis. Continue antibiotics, antifungal and diet advancement per primary surgical team.    Chronic systolic CHF (congestive heart failure) ([place]): 2D echo on 04/08/2020 showed EF of 45%.  Patient does not have leg edema JVD.  CHF seem to be compensated, no evidence of exacerbation. -Watch volume status closely   CAD (coronary artery disease): No chest pain -Blood pressure control -Aspirin is on hold  Anxiety Related to continued hospitalization, NGT, throat irritation.   Valium as needed for symptoms of anxiety  Thank you for the consult.  [facility] hospitalist service to continue following this patient with you.  DVT prophylaxis: SQ Lovenox Code Status: Full Family Communication: None present today Disposition Plan: Status is: Inpatient, per primary team.  Level of care: Med-Surg  Consultants:  Hospitalist  Procedures:  Per general surgery  Antimicrobials: Zosyn Diflucan  Subjective: Resting this morning, looks comfortable, NG tube has been removed.  He is hoping that diet can be advanced further later today.  Objective: Vitals:   11/28/22 2000 11/29/22 0343 11/29/22 0400 11/29/22 0853  BP: (!) 149/90  (!) 139/94 138/78  Pulse: 66  62 65  Resp: 16  18   Temp: 98.8 F (37.1 C)  98.4 F (36.9 C) 98.2 F (36.8 C)  TempSrc: Oral  Oral Oral  SpO2: 97%  98% 97%  Weight:  104.9 kg    Height:        Intake/Output Summary (Last 24 hours) at 11/29/2022 0940 Last data filed at 11/29/2022 0700 Gross per 24 hour  Intake 2641.89 ml  Output 427 ml  Net  2214.89 ml    Filed Weights   11/27/22 0500 11/28/22 0500 11/29/22 0343  Weight: 105.7 kg 105.7 kg 104.9 kg    Examination: General:  Alert, oriented, calm, in no acute distress resting comfortably on room air Eyes: EOMI, clear conjuctivae, white sclerea Neck: supple, no masses, trachea mildline  Cardiovascular: RRR, no murmurs or rubs, no peripheral edema  Respiratory: clear to auscultation bilaterally, no wheezes,  no crackles  Abdomen: soft, nontender, lightly distended, midline incision Skin: dry, no rashes  Musculoskeletal: no joint effusions, normal range of motion  Psychiatric: appropriate affect, normal speech  Neurologic: extraocular muscles intact, clear speech, moving all extremities with intact sensorium  Data Reviewed: I have personally reviewed following labs and imaging studies  CBC: Recent Labs  Lab 11/24/22 0528 11/25/22 0925 11/26/22 0313 11/28/22 0535 11/29/22 0626  WBC 11.4* 10.8* 9.7 8.0 8.6  HGB 13.7 14.9 15.0 14.5 13.7  HCT 40.3 43.3 42.4 42.0 40.1  MCV 96.6 94.7 92.8 95.0 95.7  PLT 140* 159 169 211 403    Basic Metabolic Panel: Recent Labs  Lab 11/23/22 0209 11/24/22 0528 11/25/22 0527 11/25/22 0925 11/26/22 0313 11/27/22 0744 11/28/22 0535 11/29/22 0626  NA 138 139  --  132* 137 135 138 138  K 4.1 3.6  --  3.5 3.5 3.4* 4.0 4.2  CL 107 105  --  99 101 106 105 105  CO2 26 29  --  '30 25 24 27 26  '$ GLUCOSE 179* 116*  --  149* 171* 148* 124* 113*  BUN 23 17  --  '14 16 19 19 22  '$ CREATININE 1.17 1.02  --  0.80 0.85 0.93 1.02 1.04  CALCIUM 7.9* 7.9*  --  8.2* 8.7* 8.1* 8.5* 8.3*  MG 2.3 2.2 2.0  --   --  2.3 2.3  --   PHOS 2.6 2.1* 3.0  --   --   --   --   --     GFR: Estimated Creatinine Clearance: 76.8 mL/min (by C-G formula based on SCr of 1.04 mg/dL). Liver Function Tests: Recent Labs  Lab 11/23/22 0209  AST 24  ALT 30  ALKPHOS 53  BILITOT 2.1*  PROT 5.6*  ALBUMIN 2.9*    No results for input(s): "LIPASE", "AMYLASE" in the last 168 hours.  No results for input(s): "AMMONIA" in the last 168 hours. Coagulation Profile: No results for input(s): "INR", "PROTIME" in the last 168 hours. Cardiac Enzymes: No results for input(s): "CKTOTAL", "CKMB", "CKMBINDEX", "TROPONINI" in the last 168 hours. BNP (last 3 results) No results for input(s): "PROBNP" in the last 8760 hours. HbA1C: No results for input(s): "HGBA1C" in the last 72  hours. CBG: Recent Labs  Lab 11/28/22 1613 11/28/22 2049 11/28/22 2324 11/29/22 0339 11/29/22 0829  GLUCAP 91 121* 133* 96 110*    Lipid Profile: No results for input(s): "CHOL", "HDL", "LDLCALC", "TRIG", "CHOLHDL", "LDLDIRECT" in the last 72 hours. Thyroid Function Tests: No results for input(s): "TSH", "T4TOTAL", "FREET4", "T3FREE", "THYROIDAB" in the last 72 hours. Anemia Panel: No results for input(s): "VITAMINB12", "FOLATE", "FERRITIN", "TIBC", "IRON", "RETICCTPCT" in the last 72 hours. Sepsis Labs: No results for input(s): "PROCALCITON", "LATICACIDVEN" in the last 168 hours.  Recent Results (from the past 240 hour(s))  MRSA Next Gen by PCR, Nasal     Status: None   Collection Time: 11/25/22  6:46 PM   Specimen: Nasal Mucosa; Nasal Swab  Result Value Ref Range Status   MRSA by PCR Next Gen NOT DETECTED NOT  DETECTED Final    Comment: (NOTE) The GeneXpert MRSA Assay (FDA approved for NASAL specimens only), is one component of a comprehensive MRSA colonization surveillance program. It is not intended to diagnose MRSA infection nor to guide or monitor treatment for MRSA infections. Test performance is not FDA approved in patients less than 67 years old. Performed at [facility], [address] [place], [place] [address]     Radiology Studies: DG UGI W SINGLE CM (SOL OR THIN BA)  Result Date: 11/28/2022 CLINICAL DATA:  Patient postoperative day 3 status post perforated duodenal ulcer repair EXAM: DG UGI W SINGLE CM TECHNIQUE: Single contrast examination was then performed using thin water-soluble contrast. This exam was performed by Reatha Armour, PA-C , and was supervised and interpreted by Dr. Damita Dunnings. A limited, problem focused exam was conducted due to limitations in patient mobility. FLUOROSCOPY: Radiation Exposure Index (as provided by the fluoroscopic device): 52.80 mGy Kerma COMPARISON:  None Available. FINDINGS: Esophagus:  Normal appearance.   Nasogastric tube in place Esophageal motility:  Within normal limits. Gastroesophageal reflux:  None visualized. Ingested 56m barium tablet: Not given Stomach: Normal appearance. No hiatal hernia. Gastric emptying: Normal. Duodenum: Normal appearance. Narrowed appearance in mid duodenum, possibly coordinated to edematous changes following perforation repair. Other:  None. IMPRESSION: No clear evidence of postoperative leak. Possible edematous changes causing mid duodenal narrowing. Electronically Signed   By: JCorrie MckusickD.O.   On: 11/28/2022 15:17    Scheduled Meds:  Chlorhexidine Gluconate Cloth  6 each Topical Daily   enoxaparin (LOVENOX) injection  0.5 mg/kg Subcutaneous Q24H   free water  30 mL Per Tube Q4H   insulin aspart  0-9 Units Subcutaneous Q4H   lidocaine  2 patch Transdermal Q24H   losartan  100 mg Oral Daily   methocarbamol  500 mg Oral TID   mouth rinse  15 mL Mouth Rinse 4 times per day   pantoprazole (PROTONIX) IV  40 mg Intravenous Q12H   Continuous Infusions:  sodium chloride 10 mL/hr at 11/28/22 0539   feeding supplement (OSMOLITE 1.5 CAL) 1,000 mL (11/29/22 0916)   fluconazole (DIFLUCAN) IV Stopped (11/28/22 1909)    LOS: 8 days   Erek Kowal MNeva Seat MD [facility]  If 7PM-7AM, please contact night-coverage  11/29/2022, 9:40 AM

## 2022-11-30 DIAGNOSIS — K265 Chronic or unspecified duodenal ulcer with perforation: Secondary | ICD-10-CM | POA: Diagnosis not present

## 2022-11-30 LAB — BASIC METABOLIC PANEL
Anion gap: 6 (ref 5–15)
BUN: 21 mg/dL (ref 8–23)
CO2: 26 mmol/L (ref 22–32)
Calcium: 8.3 mg/dL — ABNORMAL LOW (ref 8.9–10.3)
Chloride: 106 mmol/L (ref 98–111)
Creatinine, Ser: 1.15 mg/dL (ref 0.61–1.24)
GFR, Estimated: >60 mL/min (ref >60)
Glucose, Bld: 104 mg/dL — ABNORMAL HIGH (ref 70–99)
Potassium: 4.2 mmol/L (ref 3.5–5.1)
Sodium: 138 mmol/L (ref 135–145)

## 2022-11-30 LAB — CBC
HCT: 37.5 % — ABNORMAL LOW (ref 39.0–52.0)
Hemoglobin: 12.5 g/dL — ABNORMAL LOW (ref 13.0–17.0)
MCH: 32.3 pg (ref 26.0–34.0)
MCHC: 33.3 g/dL (ref 30.0–36.0)
MCV: 96.9 fL (ref 80.0–100.0)
Platelets: 258 10*3/uL (ref 150–400)
RBC: 3.87 MIL/uL — ABNORMAL LOW (ref 4.22–5.81)
RDW: 12.5 % (ref 11.5–15.5)
WBC: 8.5 10*3/uL (ref 4.0–10.5)
nRBC: 0 % (ref 0.0–0.2)

## 2022-11-30 LAB — GLUCOSE, CAPILLARY
Glucose-Capillary: 141 mg/dL — ABNORMAL HIGH (ref 70–99)
Glucose-Capillary: 92 mg/dL (ref 70–99)
Glucose-Capillary: 119 mg/dL — ABNORMAL HIGH (ref 70–99)
Glucose-Capillary: 117 mg/dL — ABNORMAL HIGH (ref 70–99)
Glucose-Capillary: 152 mg/dL — ABNORMAL HIGH (ref 70–99)

## 2022-11-30 MED ORDER — IOHEXOL 9 MG/ML PO SOLN: 500.0000 mL | ORAL | Status: AC

## 2022-11-30 NOTE — Discharge Instructions (Signed)

## 2022-11-30 NOTE — Progress Notes (Signed)
Inpatient Consult PROGRESS NOTE   Ricardo Winters.  YHC:[id number] DOB: 05/09/1946 DOA: 11/21/2022 PCP: System, Provider Not In   Brief Narrative:  Ricardo Winters. is an 77 y.o. male with PMH of HTN, CAD, sCHF with EF 45%, who is admitted due to perforated duodenal ulcer on 1/24. Pt is 4 Days Post-Op s/p exploratory laparotomy. We are asked consult due to hypertensive urgency.   Patient normally takes Cozaar 100 mg daily at home.  Due to surgery, patient cannot take any oral pills.  Patient has been treated with as needed IV hydralazine after surgery.  Today patient was noted to have elevated blood pressure, up to 222/103.  Patient is asymptomatic.  Patient denies chest pain, cough, shortness breath, chest pain, palpitation.  Patient has mild abdominal pain in surgical site.  He has nausea, no vomiting or diarrhea.  No fever or chills.  No symptoms of UTI.  Transfer to stepdown and initiation of nicardipine infusion.  Patient asymptomatic.  Blood pressure control improved on drip.  Discussed with general surgery.  Now off gtt.  BP improved with clonidine transdermal patch.  As needed IV hydralazine with parameters.  Transferred to floor and BP doing well off Cardene.  Had upper GI series without serious complication on 5/17, NG tube was removed and patient has been tolerating full liquid diet since 11/29/22.  Assessment & Plan:   Principal Problem:   Perforated duodenal ulcer ([place]) Active Problems:   Hypertensive urgency   HTN (hypertension)   Chronic systolic CHF (congestive heart failure) (HCC)   CAD (coronary artery disease)  Hypertensive urgency and history of hypertension: Current pressure much improved now on his home Cozaar dose, clonidine patch has been discontinued.  He has hydralazine available for uncontrolled hypertension.  Perforated duodenal ulcer [facility]): Patient had a successful surgery.  Status post exploratory laparotomy.  NG tube removed. Jejunostomy tube in place.   No fever.  No leukocytosis. Continue antibiotics, antifungal and diet advancement per primary surgical team.    Chronic systolic CHF (congestive heart failure) ([place]): 2D echo on 04/08/2020 showed EF of 45%.  Patient does not have leg edema JVD.  CHF seem to be compensated, no evidence of exacerbation. -Watch volume status closely   CAD (coronary artery disease): No chest pain -Blood pressure control -Aspirin is on hold  Anxiety Related to continued hospitalization, NGT, throat irritation.   Valium as needed for symptoms of anxiety  Thank you for the consult.  [facility] hospitalist service to continue following this patient with you.  DVT prophylaxis: SQ Lovenox Code Status: Full Family Communication: None present today Disposition Plan: Status is: Inpatient, per primary team.  Level of care: Med-Surg  Consultants:  Hospitalist  Procedures:  Per general surgery  Antimicrobials: Zosyn Diflucan  Subjective: Resting this morning, looks comfortable, tolerating full liq diet.  Objective: Vitals:   11/29/22 2012 11/30/22 0357 11/30/22 0401 11/30/22 0820  BP: (!) 155/78 (!) 142/85  (!) 155/82  Pulse: 70 76  67  Resp: (!) '22 17  16  '$ Temp: 97.8 F (36.6 C) 98.9 F (37.2 C)  98.1 F (36.7 C)  TempSrc: Oral Oral  Oral  SpO2: 99% 95%  97%  Weight:   104.5 kg   Height:        Intake/Output Summary (Last 24 hours) at 11/30/2022 0910 Last data filed at 11/30/2022 0600 Gross per 24 hour  Intake 1608.99 ml  Output 1435 ml  Net 173.99 ml    [employer]  11/28/22 0500 11/29/22 0343 11/30/22 0401  Weight: 105.7 kg 104.9 kg 104.5 kg    Examination: General:  Alert, oriented, calm, in no acute distress  Eyes: EOMI, clear conjuctivae, white sclerea Neck: supple, no masses, trachea mildline  Cardiovascular: RRR, no murmurs or rubs, no peripheral edema  Respiratory: clear to auscultation bilaterally, no wheezes, no crackles  Abdomen: soft, nontender, nondistended, normal bowel  tones heard  Skin: dry, no rashes  Musculoskeletal: no joint effusions, normal range of motion  Psychiatric: appropriate affect, normal speech  Neurologic: extraocular muscles intact, clear speech, moving all extremities with intact sensorium   Data Reviewed: I have personally reviewed following labs and imaging studies  CBC: Recent Labs  Lab 11/25/22 0925 11/26/22 0313 11/28/22 0535 11/29/22 0626 11/30/22 0216  WBC 10.8* 9.7 8.0 8.6 8.5  HGB 14.9 15.0 14.5 13.7 12.5*  HCT 43.3 42.4 42.0 40.1 37.5*  MCV 94.7 92.8 95.0 95.7 96.9  PLT 159 169 211 240 443    Basic Metabolic Panel: Recent Labs  Lab 11/24/22 0528 11/25/22 0527 11/25/22 0925 11/26/22 0313 11/27/22 0744 11/28/22 0535 11/29/22 0626 11/30/22 0216  NA 139  --    < > 137 135 138 138 138  K 3.6  --    < > 3.5 3.4* 4.0 4.2 4.2  CL 105  --    < > 101 106 105 105 106  CO2 29  --    < > '25 24 27 26 26  '$ GLUCOSE 116*  --    < > 171* 148* 124* 113* 104*  BUN 17  --    < > '16 19 19 22 21  '$ CREATININE 1.02  --    < > 0.85 0.93 1.02 1.04 1.15  CALCIUM 7.9*  --    < > 8.7* 8.1* 8.5* 8.3* 8.3*  MG 2.2 2.0  --   --  2.3 2.3  --   --   PHOS 2.1* 3.0  --   --   --   --   --   --    < > = values in this interval not displayed.    GFR: Estimated Creatinine Clearance: 69.4 mL/min (by C-G formula based on SCr of 1.15 mg/dL). Liver Function Tests: No results for input(s): "AST", "ALT", "ALKPHOS", "BILITOT", "PROT", "ALBUMIN" in the last 168 hours.  No results for input(s): "LIPASE", "AMYLASE" in the last 168 hours.  No results for input(s): "AMMONIA" in the last 168 hours. Coagulation Profile: No results for input(s): "INR", "PROTIME" in the last 168 hours. Cardiac Enzymes: No results for input(s): "CKTOTAL", "CKMB", "CKMBINDEX", "TROPONINI" in the last 168 hours. BNP (last 3 results) No results for input(s): "PROBNP" in the last 8760 hours. HbA1C: No results for input(s): "HGBA1C" in the last 72 hours. CBG: Recent  Labs  Lab 11/29/22 1528 11/29/22 2009 11/29/22 2334 11/30/22 0359 11/30/22 0818  GLUCAP 93 111* 91 141* 92    Lipid Profile: No results for input(s): "CHOL", "HDL", "LDLCALC", "TRIG", "CHOLHDL", "LDLDIRECT" in the last 72 hours. Thyroid Function Tests: No results for input(s): "TSH", "T4TOTAL", "FREET4", "T3FREE", "THYROIDAB" in the last 72 hours. Anemia Panel: No results for input(s): "VITAMINB12", "FOLATE", "FERRITIN", "TIBC", "IRON", "RETICCTPCT" in the last 72 hours. Sepsis Labs: No results for input(s): "PROCALCITON", "LATICACIDVEN" in the last 168 hours.  Recent Results (from the past 240 hour(s))  MRSA Next Gen by PCR, Nasal     Status: None   Collection Time: 11/25/22  6:46 PM   Specimen: Nasal Mucosa;  Nasal Swab  Result Value Ref Range Status   MRSA by PCR Next Gen NOT DETECTED NOT DETECTED Final    Comment: (NOTE) The GeneXpert MRSA Assay (FDA approved for NASAL specimens only), is one component of a comprehensive MRSA colonization surveillance program. It is not intended to diagnose MRSA infection nor to guide or monitor treatment for MRSA infections. Test performance is not FDA approved in patients less than 12 years old. Performed at [facility], [address] [place], [place] [address]     Radiology Studies: DG UGI W SINGLE CM (SOL OR THIN BA)  Result Date: 11/28/2022 CLINICAL DATA:  Patient postoperative day 3 status post perforated duodenal ulcer repair EXAM: DG UGI W SINGLE CM TECHNIQUE: Single contrast examination was then performed using thin water-soluble contrast. This exam was performed by Reatha Armour, PA-C , and was supervised and interpreted by Dr. Damita Dunnings. A limited, problem focused exam was conducted due to limitations in patient mobility. FLUOROSCOPY: Radiation Exposure Index (as provided by the fluoroscopic device): 52.80 mGy Kerma COMPARISON:  None Available. FINDINGS: Esophagus:  Normal appearance.  Nasogastric tube in place  Esophageal motility:  Within normal limits. Gastroesophageal reflux:  None visualized. Ingested 63m barium tablet: Not given Stomach: Normal appearance. No hiatal hernia. Gastric emptying: Normal. Duodenum: Normal appearance. Narrowed appearance in mid duodenum, possibly coordinated to edematous changes following perforation repair. Other:  None. IMPRESSION: No clear evidence of postoperative leak. Possible edematous changes causing mid duodenal narrowing. Electronically Signed   By: JCorrie MckusickD.O.   On: 11/28/2022 15:17    Scheduled Meds:  Chlorhexidine Gluconate Cloth  6 each Topical Daily   enoxaparin (LOVENOX) injection  0.5 mg/kg Subcutaneous Q24H   free water  30 mL Per Tube Q4H   insulin aspart  0-9 Units Subcutaneous Q4H   iohexol  500 mL Oral Q1H   lidocaine  2 patch Transdermal Q24H   losartan  100 mg Oral Daily   methocarbamol  500 mg Oral TID   mouth rinse  15 mL Mouth Rinse 4 times per day   pantoprazole (PROTONIX) IV  40 mg Intravenous Q12H   Continuous Infusions:  sodium chloride 10 mL/hr at 11/29/22 1513   feeding supplement (OSMOLITE 1.5 CAL) 1,000 mL (11/29/22 0916)   fluconazole (DIFLUCAN) IV 800 mg (11/29/22 1514)   piperacillin-tazobactam (ZOSYN)  IV 3.375 g (11/30/22 0628)    LOS: 9 days   Caia Lofaro MNeva Seat MD [facility]  If 7PM-7AM, please contact night-coverage  11/30/2022, 9:10 AM

## 2022-11-30 NOTE — Progress Notes (Signed)
PT Cancellation Note  Patient Details Name: Ricardo Winters. MRN: [id number] DOB: [birth year]   Cancelled Treatment:     PT attempted several times throughout the day. First attempt, pt was getting hair washed by GF. Currently pt dealing with diarrhea requested PT return a different day.     Willette Pa 11/30/2022, 3:34 PM

## 2022-11-30 NOTE — TOC Progression Note (Signed)
Transition of Care (TOC) - Progression Note    Patient Details  Name: Ricardo Winters. MRN: [id number] Date of Birth: 1946-09-25  Transition of Care [facility]) CM/SW [place], LCSW Phone Number: 11/30/2022, 11:05 AM  Clinical Narrative:   Patient is still unsure about home health. It will depend on his session with PT this afternoon. PCP is Lenise Arena, MD at [facility] in [place]. His girlfriend will transport him home at discharge.  Expected Discharge Plan: [place] Barriers to Discharge: Continued Medical Work up  Expected Discharge Plan and Services   Discharge Planning Services: CM Consult Post Acute Care Choice: [place] arrangements for the past 2 months: Single Family Home                                       Social Determinants of Health (SDOH) Interventions SDOH Screenings   Food Insecurity: No Food Insecurity (11/21/2022)  Housing: Low Risk  (11/21/2022)  Transportation Needs: No Transportation Needs (11/21/2022)  Utilities: Not At Risk (11/21/2022)  Depression (PHQ2-9): Low Risk  (11/12/2022)  Tobacco Use: Low Risk  (11/22/2022)    Readmission Risk Interventions     No data to display

## 2022-11-30 NOTE — Progress Notes (Signed)
Nutrition Follow-up  DOCUMENTATION CODES:   Obesity unspecified  INTERVENTION:   -Continue with full liquid diet -TF via j-tube:  Osmolite 1.5 @ 35 ml/hr   30 ml free water flush every 4 hours   Tube feeding regimen provides 1260 kcal (53% of needs, 53 grams of protein, and 640 ml of H2O. Total free water: 820 ml daily  -Magic cup TID with meals, each supplement provides 290 kcal and 9 grams of protein   -Provided "Low Fiber Nutrition Therapy" handout from [facility] Nutrition Care Manual; attached to AVS/ discharge instructions  NUTRITION DIAGNOSIS:   Inadequate oral intake related to altered GI function as evidenced by NPO status.  Progressing; advanced to full liquids on 11/29/21  GOAL:   Patient will meet greater than or equal to 90% of their needs  Progressing   MONITOR:   Diet advancement, Labs, Weight trends, TF tolerance, I & O's, Skin  REASON FOR ASSESSMENT:   Consult Enteral/tube feeding initiation and management  ASSESSMENT:   77 y/o male with h/o CAD, HTN, DVT, HLD and MI who is admitted with perforated duodenal ulcer now s/p exploratory laparotomy 1/24 (with placement of feeding jejunostomy, and reinforcement of contained perforated duodenal ulcer).  1/31- s/p UGI- no leak, NGT removed, advanced to clear liquids 2/1- advanced to full liquid diet, TF rate reduced to 35 ml/hr per general surgery  Reviewed I/O's: +174 ml x 24 hours and +8.9 L since admission  UOP: 1.4 L x 24 hours  Drain output: 35 ml x 24 hours   Per general surgery notes, plan imaging tomorrow to evaluate possibility of advancing diet. Plan to d/c TF once able to advance to soft diet.   Spoke with pt at bedside, who was pleasant and in good spirits today. Pt reports feeling well, but diarrhea has remained the same over the past 2 days. Pt denies any abdominal pain, nausea, and vomiting. He is tolerating the full liquids well, but complains about lack of options on diet, joking with this  RD about adding bacon grease to his grits in the morning. Pt eager to have diet advanced and verbalized understanding of necessity of testing tomorrow prior to diet advancement. RD discussed with pt what to expect on soft diet.   Per pt, possible d/c over the weekend.  Wt has been stable since admission.   Labs reviewed: CBGS: 92-141 (inpatient orders for glycemic control are 0-9 units insulin aspart every 4 hours).     Diet Order:   Diet Order             Diet full liquid Fluid consistency: Thin  Diet effective now                   EDUCATION NEEDS:   Education needs have been addressed  Skin:  Skin Assessment: Skin Integrity Issues: Skin Integrity Issues:: Incisions Incisions: closed abdomen  Last BM:  11/29/22 (type 6)  Height:   Ht Readings from Last 1 Encounters:  11/21/22 6' (1.829 m)    Weight:   Wt Readings from Last 1 Encounters:  11/30/22 104.5 kg    Ideal Body Weight:  80.9 kg  BMI:  Body mass index is 31.25 kg/m.  Estimated Nutritional Needs:   Kcal:  2400-2700kcal/day  Protein:  120-135g/day  Fluid:  2.1-2.4L/day    Loistine Chance, RD, LDN, [place] Registered Dietitian II Certified Diabetes Care and Education Specialist Please refer to [facility] for RD and/or RD on-call/weekend/after hours pager

## 2022-11-30 NOTE — Care Management Important Message (Signed)
Important Message  Patient Details  Name: Ricardo Winters. MRN: [id number] Date of Birth: 03-17-1946   Medicare Important Message Given:  Yes     Dannette Barbara 11/30/2022, 11:34 AM

## 2022-11-30 NOTE — Progress Notes (Addendum)
[facility] Day(s): 9.   Post op day(s): 9 Days Post-Op.   Interval History:  Patient seen and examined No issues overnight Patient reports he is doing better No fever, chills, nausea, emesis Labs remain very reassuring this AM UO - 1400 ccs Drains x2 in place  - RUQ: 20 ccs; serous  - LUQ: 15 ccs; seropurulent  Does have jejunostomy; on TF; down to 35 ml/hr - diarrhea improving Continues on Zosyn; Fluconazole  On FLD; tolerating better  Vital signs in last 24 hours: [min-max] current  Temp:  [97.8 F (36.6 C)-98.9 F (37.2 C)] 98.9 F (37.2 C) (02/02 0357) Pulse Rate:  [65-76] 76 (02/02 0357) Resp:  [17-22] 17 (02/02 0357) BP: (138-155)/(78-85) 142/85 (02/02 0357) SpO2:  [95 %-99 %] 95 % (02/02 0357) FiO2 (%):  [21 %] 21 % (02/01 0853) Weight:  [104.5 kg] 104.5 kg (02/02 0401)     Height: 6' (182.9 cm) Weight: 104.5 kg BMI (Calculated): 31.24   Intake/Output last 2 shifts:  02/01 0701 - 02/02 0700 In: 1609 [NG/GT:1134.4; IV Piggyback:474.6] Out: 8242 [Urine:1400; Drains:35]   Physical Exam:  Constitutional: alert, cooperative and no distress  Respiratory: breathing non-labored at rest  Cardiovascular: regular rate and sinus rhythm  Gastrointestinal: Soft, incisional soreness, non-distended, no rebound/guarding. Feeding jejunostomy in left abdomen. RUQ drain; serosanguinous. LUQ drain; seropurulent  Integumentary: Laparotomy is CDI with staples, no erythema   Labs:     Latest Ref Rng & Units 11/30/2022    2:16 AM 11/29/2022    6:26 AM 11/28/2022    5:35 AM  CBC  WBC 4.0 - 10.5 K/uL 8.5  8.6  8.0   Hemoglobin 13.0 - 17.0 g/dL 12.5  13.7  14.5   Hematocrit 39.0 - 52.0 % 37.5  40.1  42.0   Platelets 150 - 400 K/uL 258  240  211       Latest Ref Rng & Units 11/30/2022    2:16 AM 11/29/2022    6:26 AM 11/28/2022    5:35 AM  CMP  Glucose 70 - 99 mg/dL 104  113  124   BUN 8 - 23 mg/dL '21  22  19   '$ Creatinine 0.61 - 1.24  mg/dL 1.15  1.04  1.02   Sodium 135 - 145 mmol/L 138  138  138   Potassium 3.5 - 5.1 mmol/L 4.2  4.2  4.0   Chloride 98 - 111 mmol/L 106  105  105   CO2 22 - 32 mmol/L '26  26  27   '$ Calcium 8.9 - 10.3 mg/dL 8.3  8.3  8.5     Imaging studies: No new pertinent imaging studies   Assessment/Plan: 77 y.o. male 9 Days Post-Op s/p exploratory laparotomy, placement of feeding jejunostomy, and reinforcement of contained perforated duodenal ulcer with purulent fluid in retroperitoneum.   - Will plan for CT Abdomen/Pelvis tomorrow (02/03) to re-evaluate question of duodenal narrowing as well as change in LUQ drain output.   - Continue FLD for now; Pending imaging tomorrow may be able to advance to soft diet  - Continue TF to 35 ml/hr today; NO PILLS via jejunostomy   - If CT is reassuring, we can begin to wean from these completely tomorrow (02/03) -vs- over the weekend   - Continue Imodium for diarrhea BID; titrate as needed   - Appreciate medicine assistance with HTN; improving  - Continue IV Abx (Zosyn); IV Antifungal  - Continue IV PPI BID  -  Continue IVF resuscitation; monitor renal function  - Continue surgical drains x2; monitor and record output    - LUQ drain now more seropurulent.    - RUQ serosanguinous; output has been <20 ml daily; we may be able to remove this in a few days.   - Monitor abdominal examination; on-going bowel function - Pain control prn; transitioned to PO - Antiemetics prn  - Okay to mobilize; therapies on board; recommendations are HHPT   - Discharge Planning: Doing well, plan for CT Abdomen/Pelvis tomorrow (02/03). If reassuring and continues to do well; likely DC home Sunday (02/04) vs early next week.   All of the above findings and recommendations were discussed with the patient, and the medical team, and all of patient's questions were answered to his expressed satisfaction.  -- Edison Simon, PA-C  [facility] 11/30/2022, 7:31 AM M-F: 7am  - 4pm

## 2022-12-01 ENCOUNTER — Inpatient Hospital Stay: Payer: PPO

## 2022-12-01 DIAGNOSIS — K265 Chronic or unspecified duodenal ulcer with perforation: Secondary | ICD-10-CM | POA: Diagnosis not present

## 2022-12-01 LAB — GLUCOSE, CAPILLARY
Glucose-Capillary: 104 mg/dL — ABNORMAL HIGH (ref 70–99)
Glucose-Capillary: 110 mg/dL — ABNORMAL HIGH (ref 70–99)
Glucose-Capillary: 112 mg/dL — ABNORMAL HIGH (ref 70–99)
Glucose-Capillary: 118 mg/dL — ABNORMAL HIGH (ref 70–99)
Glucose-Capillary: 153 mg/dL — ABNORMAL HIGH (ref 70–99)
Glucose-Capillary: 90 mg/dL (ref 70–99)

## 2022-12-01 LAB — CBC
HCT: 39.5 % (ref 39.0–52.0)
Hemoglobin: 13.5 g/dL (ref 13.0–17.0)
MCH: 32.3 pg (ref 26.0–34.0)
MCHC: 34.2 g/dL (ref 30.0–36.0)
MCV: 94.5 fL (ref 80.0–100.0)
Platelets: 289 10*3/uL (ref 150–400)
RBC: 4.18 MIL/uL — ABNORMAL LOW (ref 4.22–5.81)
RDW: 12.4 % (ref 11.5–15.5)
WBC: 12 10*3/uL — ABNORMAL HIGH (ref 4.0–10.5)
nRBC: 0 % (ref 0.0–0.2)

## 2022-12-01 LAB — BASIC METABOLIC PANEL
Anion gap: 7 (ref 5–15)
BUN: 22 mg/dL (ref 8–23)
CO2: 24 mmol/L (ref 22–32)
Calcium: 8.3 mg/dL — ABNORMAL LOW (ref 8.9–10.3)
Chloride: 106 mmol/L (ref 98–111)
Creatinine, Ser: 1.05 mg/dL (ref 0.61–1.24)
GFR, Estimated: >60 mL/min (ref >60)
Glucose, Bld: 113 mg/dL — ABNORMAL HIGH (ref 70–99)
Potassium: 4 mmol/L (ref 3.5–5.1)
Sodium: 137 mmol/L (ref 135–145)

## 2022-12-01 MED ORDER — IOHEXOL 300 MG/ML  SOLN
100.0000 mL | Freq: Once | INTRAMUSCULAR | Status: AC | PRN
  Administered 2022-12-01: 100 mL via INTRAVENOUS

## 2022-12-01 MED ORDER — OSMOLITE 1.5 CAL PO LIQD
1000.0000 mL | ORAL | Status: DC
  Administered 2022-12-01 – 2022-12-03 (×2): 1000 mL

## 2022-12-01 NOTE — Progress Notes (Signed)
Brief Nutrition Support Note  RD received page on on-call/weekend pager from Dr. Hampton Abbot. Per MD, pt had repeat CT abdomen/pelvis completed today demonstrating a small collection of contrast/air medial to the 2nd portion of the duodenum along with extraluminal gas. Per MD, this could be a contained perforation/leak vs the diverticulum itself. MD has made pt NPO and would like tube feeds to be advanced back to goal rate to meet 100% of pt's estimated needs.  RD will adjust tube feeding orders back to previous goal regimen: - Osmolite 1.5 @ 75 ml/hr (1800 ml/day)  Tube feeding regimen provides 2700 kcal, 113 grams of protein, and 1372 ml of H2O.   RD will continue to follow pt during admission.   Gustavus Bryant, MS, RD, LDN Inpatient Clinical Dietitian Please see AMiON for contact information.

## 2022-12-01 NOTE — Progress Notes (Signed)
12/01/2022  Subjective: Patient reports some discomfort in the RUQ and his WBC is elevated again to 12.0.  Left sided drain remains with purulent fluid, no bilious output on either drain.  CT scan of abdomen/pelvis just done and discussed with Dr. Tery Sanfilippo.  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  There's a small collection of contrast/air medial to the 2nd portion of the duodenum, but also some extraluminal gas.  Vital signs: Temp:  [98.1 F (36.7 C)-98.5 F (36.9 C)] 98.2 F (36.8 C) (02/03 0820) Pulse Rate:  [67-83] 67 (02/03 0820) Resp:  [16-20] 18 (02/03 0820) BP: (153-161)/(78-88) 161/88 (02/03 0820) SpO2:  [96 %-99 %] 99 % (02/03 0820)   Intake/Output: 02/02 0701 - 02/03 0700 In: 945.3 [P.O.:240; NG/GT:705.3] Out: 1830 [Urine:1800; Drains:30] Last BM Date : 12/01/22  Physical Exam: Constitutional:  No acute distress Abdomen:  soft, non-distended, appropriately sore to palpation.  No worsening pain or peritonitis.  Drains stable, with left side purulent and right side serosanguinous.  Midline incision clean, dry, intact.  Labs:  Recent Labs    11/30/22 0216 12/01/22 0542  WBC 8.5 12.0*  HGB 12.5* 13.5  HCT 37.5* 39.5  PLT 258 289   Recent Labs    11/30/22 0216 12/01/22 0542  NA 138 137  K 4.2 4.0  CL 106 106  CO2 26 24  GLUCOSE 104* 113*  BUN 21 22  CREATININE 1.15 1.05  CALCIUM 8.3* 8.3*   No results for input(s): "LABPROT", "INR" in the last 72 hours.  Imaging: No results found.  Assessment/Plan: This is a 77 y.o. male with perforated duodenal ulcer s/p exlap and J tube placement.  --discussed CT scan findings with Dr. Tery Sanfilippo.  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  This could be a contained perforation/leak vs the diverticulum itself.  No abscess noted at least, and the contrast seems to be contained.  It also seems to be  more dilute compared to the rest of the oral contrast he had, so could be residual from his UGI. --For now, given his elevated WBC and uncertainty over current process, will make him NPO again.  Will have to increase his TF rate again for better nutrition.   --Continue IV Zosyn and fluconazole. --repeat labs tomorrow.   Melvyn Neth, [facility]

## 2022-12-01 NOTE — Progress Notes (Signed)
Inpatient Consult PROGRESS NOTE  Ricardo Winters.  DUK:[id number] DOB: [birth year] DOA: 11/21/2022 PCP: System, Provider Not In   Brief Narrative:  Ricardo Winters. is an 77 y.o. male with PMH of HTN, CAD, sCHF with EF 45%, who is admitted due to perforated duodenal ulcer on 1/24. Pt is 4 Days Post-Op s/p exploratory laparotomy. We are asked consult due to hypertensive urgency.   Patient normally takes Cozaar 100 mg daily at home.  Due to surgery, patient cannot take any oral pills.  Patient has been treated with as needed IV hydralazine after surgery.  Today patient was noted to have elevated blood pressure, up to 222/103.  Patient is asymptomatic.  Patient denies chest pain, cough, shortness breath, chest pain, palpitation.  Patient has mild abdominal pain in surgical site.  He has nausea, no vomiting or diarrhea.  No fever or chills.  No symptoms of UTI.  Transfer to stepdown and initiation of nicardipine infusion.  Patient asymptomatic.  Blood pressure control improved on drip.  Discussed with general surgery.  Now off gtt.  BP improved with clonidine transdermal patch.  As needed IV hydralazine with parameters.  Transferred to floor and BP doing well off Cardene.  Had upper GI series without serious complication on 3/76, NG tube was removed and patient has been tolerating full liquid diet since 11/29/22.  We have resumed his home meds including losartan.  Blood pressure overall is decently well-controlled.  Going for CT scan this morning.  Assessment & Plan:   Principal Problem:   Perforated duodenal ulcer ([place]) Active Problems:   Hypertensive urgency   HTN (hypertension)   Chronic systolic CHF (congestive heart failure) (HCC)   CAD (coronary artery disease)  Hypertensive urgency and history of hypertension: Current pressure much improved now on his home Cozaar dose, clonidine patch has been discontinued.  He has hydralazine available for uncontrolled hypertension.  BP elevated this  morning, but has not received his losartan yet since went down for CT scan.  Perforated duodenal ulcer [facility]): Patient had a successful surgery.  Status post exploratory laparotomy.  NG tube removed. Jejunostomy tube in place.  No fever.  No leukocytosis. Continue antibiotics, antifungal and diet advancement per primary surgical team.    Chronic systolic CHF (congestive heart failure) ([place]): 2D echo on 04/08/2020 showed EF of 45%.  Patient does not have leg edema JVD.  CHF seem to be compensated, no evidence of exacerbation. -Watch volume status closely   CAD (coronary artery disease): No chest pain -Blood pressure control -Aspirin is on hold  Anxiety Related to continued hospitalization, NGT, throat irritation.   Valium as needed for symptoms of anxiety  Thank you for the consult.  [facility] hospitalist service to continue following this patient with you.  DVT prophylaxis: SQ Lovenox Code Status: Full Family Communication: None present today Disposition Plan: Status is: Inpatient, per primary team.  Level of care: Med-Surg  Consultants:  Hospitalist  Procedures:  Per general surgery  Antimicrobials: Zosyn Diflucan  Subjective: Resting this morning, looks comfortable, tolerating full liq diet.  Objective: Vitals:   11/30/22 1700 11/30/22 1947 12/01/22 0420 12/01/22 0820  BP: (!) 154/78 (!) 157/78 (!) 153/78 (!) 161/88  Pulse: 70 83 69 67  Resp: '16 20 18 18  '$ Temp: 98.1 F (36.7 C) 98.5 F (36.9 C) 98.5 F (36.9 C) 98.2 F (36.8 C)  TempSrc: Oral Oral Oral Oral  SpO2: 98% 97% 96% 99%  Weight:      Height:  Intake/Output Summary (Last 24 hours) at 12/01/2022 1102 Last data filed at 12/01/2022 0531 Gross per 24 hour  Intake 705.25 ml  Output 1830 ml  Net -1124.75 ml    Filed Weights   11/28/22 0500 11/29/22 0343 11/30/22 0401  Weight: 105.7 kg 104.9 kg 104.5 kg    Examination: General:  Alert, oriented, calm, in no acute distress sitting up in bed this room,  comfortable on room air Eyes: EOMI, clear conjuctivae, white sclerea Neck: supple, no masses, trachea mildline  Cardiovascular: RRR, no murmurs or rubs, no peripheral edema  Respiratory: clear to auscultation bilaterally, no wheezes, no crackles  Abdomen: soft, nontender, nondistended, normal bowel tones heard  Skin: dry, no rashes  Musculoskeletal: no joint effusions, normal range of motion  Psychiatric: appropriate affect, normal speech  Neurologic: extraocular muscles intact, clear speech, moving all extremities with intact sensorium   Data Reviewed: I have personally reviewed following labs and imaging studies  CBC: Recent Labs  Lab 11/26/22 0313 11/28/22 0535 11/29/22 0626 11/30/22 0216 12/01/22 0542  WBC 9.7 8.0 8.6 8.5 12.0*  HGB 15.0 14.5 13.7 12.5* 13.5  HCT 42.4 42.0 40.1 37.5* 39.5  MCV 92.8 95.0 95.7 96.9 94.5  PLT 169 211 240 258 616    Basic Metabolic Panel: Recent Labs  Lab 11/25/22 0527 11/25/22 0925 11/27/22 0744 11/28/22 0535 11/29/22 0626 11/30/22 0216 12/01/22 0542  NA  --    < > 135 138 138 138 137  K  --    < > 3.4* 4.0 4.2 4.2 4.0  CL  --    < > 106 105 105 106 106  CO2  --    < > '24 27 26 26 24  '$ GLUCOSE  --    < > 148* 124* 113* 104* 113*  BUN  --    < > '19 19 22 21 22  '$ CREATININE  --    < > 0.93 1.02 1.04 1.15 1.05  CALCIUM  --    < > 8.1* 8.5* 8.3* 8.3* 8.3*  MG 2.0  --  2.3 2.3  --   --   --   PHOS 3.0  --   --   --   --   --   --    < > = values in this interval not displayed.    GFR: Estimated Creatinine Clearance: 76 mL/min (by C-G formula based on SCr of 1.05 mg/dL). Liver Function Tests: No results for input(s): "AST", "ALT", "ALKPHOS", "BILITOT", "PROT", "ALBUMIN" in the last 168 hours.  No results for input(s): "LIPASE", "AMYLASE" in the last 168 hours.  No results for input(s): "AMMONIA" in the last 168 hours. Coagulation Profile: No results for input(s): "INR", "PROTIME" in the last 168 hours. Cardiac Enzymes: No  results for input(s): "CKTOTAL", "CKMB", "CKMBINDEX", "TROPONINI" in the last 168 hours. BNP (last 3 results) No results for input(s): "PROBNP" in the last 8760 hours. HbA1C: No results for input(s): "HGBA1C" in the last 72 hours. CBG: Recent Labs  Lab 11/30/22 1659 11/30/22 1949 12/01/22 0056 12/01/22 0422 12/01/22 0823  GLUCAP 117* 152* 118* 112* 90    Lipid Profile: No results for input(s): "CHOL", "HDL", "LDLCALC", "TRIG", "CHOLHDL", "LDLDIRECT" in the last 72 hours. Thyroid Function Tests: No results for input(s): "TSH", "T4TOTAL", "FREET4", "T3FREE", "THYROIDAB" in the last 72 hours. Anemia Panel: No results for input(s): "VITAMINB12", "FOLATE", "FERRITIN", "TIBC", "IRON", "RETICCTPCT" in the last 72 hours. Sepsis Labs: No results for input(s): "PROCALCITON", "LATICACIDVEN" in the last 168 hours.  Recent Results (from the past 240 hour(s))  MRSA Next Gen by PCR, Nasal     Status: None   Collection Time: 11/25/22  6:46 PM   Specimen: Nasal Mucosa; Nasal Swab  Result Value Ref Range Status   MRSA by PCR Next Gen NOT DETECTED NOT DETECTED Final    Comment: (NOTE) The GeneXpert MRSA Assay (FDA approved for NASAL specimens only), is one component of a comprehensive MRSA colonization surveillance program. It is not intended to diagnose MRSA infection nor to guide or monitor treatment for MRSA infections. Test performance is not FDA approved in patients less than 88 years old. Performed at [facility], [address], [place], [place] [address]     Radiology Studies: No results found.  Scheduled Meds:  Chlorhexidine Gluconate Cloth  6 each Topical Daily   enoxaparin (LOVENOX) injection  0.5 mg/kg Subcutaneous Q24H   free water  30 mL Per Tube Q4H   insulin aspart  0-9 Units Subcutaneous Q4H   lidocaine  2 patch Transdermal Q24H   losartan  100 mg Oral Daily   methocarbamol  500 mg Oral TID   mouth rinse  15 mL Mouth Rinse 4 times per day   pantoprazole  (PROTONIX) IV  40 mg Intravenous Q12H   Continuous Infusions:  sodium chloride 10 mL/hr at 11/29/22 1513   feeding supplement (OSMOLITE 1.5 CAL) 1,000 mL (11/30/22 2154)   fluconazole (DIFLUCAN) IV 800 mg (11/30/22 1341)   piperacillin-tazobactam (ZOSYN)  IV 3.375 g (12/01/22 0531)    LOS: 10 days   Catalea Labrecque Neva Seat, MD [facility]  If 7PM-7AM, please contact night-coverage  12/01/2022, 11:02 AM

## 2022-12-02 DIAGNOSIS — K265 Chronic or unspecified duodenal ulcer with perforation: Secondary | ICD-10-CM | POA: Diagnosis not present

## 2022-12-02 LAB — BASIC METABOLIC PANEL
Anion gap: 5 (ref 5–15)
BUN: 20 mg/dL (ref 8–23)
CO2: 26 mmol/L (ref 22–32)
Calcium: 8.3 mg/dL — ABNORMAL LOW (ref 8.9–10.3)
Chloride: 108 mmol/L (ref 98–111)
Creatinine, Ser: 1.01 mg/dL (ref 0.61–1.24)
GFR, Estimated: >60 mL/min (ref >60)
Glucose, Bld: 122 mg/dL — ABNORMAL HIGH (ref 70–99)
Potassium: 3.9 mmol/L (ref 3.5–5.1)
Sodium: 139 mmol/L (ref 135–145)

## 2022-12-02 LAB — GLUCOSE, CAPILLARY
Glucose-Capillary: 121 mg/dL — ABNORMAL HIGH (ref 70–99)
Glucose-Capillary: 108 mg/dL — ABNORMAL HIGH (ref 70–99)
Glucose-Capillary: 100 mg/dL — ABNORMAL HIGH (ref 70–99)
Glucose-Capillary: 113 mg/dL — ABNORMAL HIGH (ref 70–99)
Glucose-Capillary: 108 mg/dL — ABNORMAL HIGH (ref 70–99)
Glucose-Capillary: 118 mg/dL — ABNORMAL HIGH (ref 70–99)

## 2022-12-02 LAB — CBC
HCT: 36.6 % — ABNORMAL LOW (ref 39.0–52.0)
Hemoglobin: 12.6 g/dL — ABNORMAL LOW (ref 13.0–17.0)
MCH: 32.6 pg (ref 26.0–34.0)
MCHC: 34.4 g/dL (ref 30.0–36.0)
MCV: 94.6 fL (ref 80.0–100.0)
Platelets: 309 10*3/uL (ref 150–400)
RBC: 3.87 MIL/uL — ABNORMAL LOW (ref 4.22–5.81)
RDW: 12.3 % (ref 11.5–15.5)
WBC: 7 10*3/uL (ref 4.0–10.5)
nRBC: 0 % (ref 0.0–0.2)

## 2022-12-02 NOTE — TOC Progression Note (Signed)
Transition of Care (TOC) - Progression Note    Patient Details  Name: Ricardo Winters. MRN: [id number] Date of Birth: [birth year]  Transition of Care [facility]) CM/SW [place], LCSW Phone Number: 12/02/2022, 10:08 AM  Clinical Narrative:    Spoke to patient to follow up again about if he would like home health. Patient states he is going to work with PT this afternoon and will let staff know if he wants [facility] after that session, he is hoping to do well and not need HH.  TOC will continue to follow. Asked PT to notify CSW after session if patient is agreeable to [facility] being arranged.    Expected Discharge Plan: [place] Barriers to Discharge: Continued Medical Work up  Expected Discharge Plan and Services   Discharge Planning Services: CM Consult Post Acute Care Choice: [place] arrangements for the past 2 months: Single Family Home                                       Social Determinants of Health (SDOH) Interventions SDOH Screenings   Food Insecurity: No Food Insecurity (11/21/2022)  Housing: Low Risk  (11/21/2022)  Transportation Needs: No Transportation Needs (11/21/2022)  Utilities: Not At Risk (11/21/2022)  Depression (PHQ2-9): Low Risk  (11/12/2022)  Tobacco Use: Low Risk  (11/22/2022)    Readmission Risk Interventions     No data to display

## 2022-12-02 NOTE — Progress Notes (Signed)
PT Cancellation Note  Patient Details Name: Ricardo Winters. MRN: [id number] DOB: [birth year]   Cancelled Treatment:    Reason Eval/Treat Not Completed: Other (comment)  Declined this am requesting I return after lunch.  Pt stated he walked x 2 laps with nursing earlier and was able to bathe in the sink and wash his hair.  He stated he felt he did well and declined further gait at this time.  Discussed recommendation for HHPT at this time per [facility] request.  He stated he was not sure and asked to re-visit it tomorrow.  Will return tomorrow.    Chesley Noon 12/02/2022, 1:26 PM

## 2022-12-02 NOTE — Progress Notes (Signed)
Inpatient Consult PROGRESS NOTE  Ricardo Winters.  XBJ:[id number] DOB: [birth year] DOA: 11/21/2022 PCP: System, Provider Not In   Brief Narrative:  Ricardo Winters. is an 77 y.o. male with PMH of HTN, CAD, sCHF with EF 45%, who is admitted due to perforated duodenal ulcer on 1/24. Pt is 4 Days Post-Op s/p exploratory laparotomy. We are asked consult due to hypertensive urgency.   Patient normally takes Cozaar 100 mg daily at home.  Due to surgery, patient cannot take any oral pills.  Patient has been treated with as needed IV hydralazine after surgery.  Today patient was noted to have elevated blood pressure, up to 222/103.  Patient is asymptomatic.  Patient denies chest pain, cough, shortness breath, chest pain, palpitation.  Patient has mild abdominal pain in surgical site.  He has nausea, no vomiting or diarrhea.  No fever or chills.  No symptoms of UTI.  Transfer to stepdown and initiation of nicardipine infusion.  Patient asymptomatic.  Blood pressure control improved on drip.  Discussed with general surgery.  Now off gtt.  BP improved with clonidine transdermal patch.  As needed IV hydralazine with parameters.  Transferred to floor and BP doing well off Cardene.  Had upper GI series without serious complication on 3/08, NG tube was removed and patient has been tolerating full liquid diet since 11/29/22.  We have resumed his home meds including losartan.  Blood pressure overall is decently well-controlled.  Went for CT scan 2/3 which shows possibility of contained perforation, versus duodenal diverticulum.  No abscess.  Due to slight abdominal discomfort, CT scan findings and rising white blood cell count, the patient was made n.p.o. 2/3.  Assessment & Plan:   Principal Problem:   Perforated duodenal ulcer ([place]) Active Problems:   Hypertensive urgency   HTN (hypertension)   Chronic systolic CHF (congestive heart failure) (HCC)   CAD (coronary artery disease)  Hypertensive urgency and  history of hypertension: Current pressure much improved now on his home Cozaar dose, clonidine patch has been discontinued.  He has hydralazine available for uncontrolled hypertension.  Continue to monitor blood pressure closely.  Perforated duodenal ulcer [facility]): Patient had a successful surgery.  Status post exploratory laparotomy.  NG tube removed. Jejunostomy tube in place.  No fever.  Had some mild psychosis on 2/3 but has resolved today 2/4 Continue antibiotics, antifungal and diet management per surgery team.  Chronic systolic CHF (congestive heart failure) ([place]): 2D echo on 04/08/2020 showed EF of 45%.  Patient does not have leg edema JVD.  CHF seem to be compensated, no evidence of exacerbation. -Watch volume status closely   CAD (coronary artery disease): No chest pain -Blood pressure control -Aspirin is on hold  Anxiety Related to continued hospitalization, NGT, throat irritation.   Valium as needed for symptoms of anxiety  Thank you for the consult.  [facility] hospitalist service to continue following this patient with you.  DVT prophylaxis: SQ Lovenox Code Status: Full Family Communication: None present today Disposition Plan: Status is: Inpatient, per primary team.  Level of care: Med-Surg  Consultants:  Hospitalist  Procedures:  Per general surgery  Antimicrobials: Zosyn Diflucan  Subjective: Resting this morning, looks comfortable denies any discomfort this morning.  Objective: Vitals:   12/01/22 2040 12/02/22 0419 12/02/22 0500 12/02/22 0847  BP: (!) 158/88 (!) 144/76  (!) 156/88  Pulse: 72 65  68  Resp: '16 16  17  '$ Temp: (!) 97.4 F (36.3 C) 98.4 F (36.9 C)  98.3  F (36.8 C)  TempSrc:      SpO2: 96% 97%  95%  Weight:   103.1 kg   Height:        Intake/Output Summary (Last 24 hours) at 12/02/2022 0941 Last data filed at 12/02/2022 0700 Gross per 24 hour  Intake --  Output 520 ml  Net -520 ml    Filed Weights   11/29/22 0343 11/30/22 0401 12/02/22  0500  Weight: 104.9 kg 104.5 kg 103.1 kg    Physical exam:: General:  Alert, oriented, calm, in no acute distress  Eyes: EOMI, clear conjuctivae, white sclerea Neck: supple, no masses, trachea mildline  Cardiovascular: RRR, no murmurs or rubs, no peripheral edema  Respiratory: clear to auscultation bilaterally, no wheezes, no crackles  Abdomen: soft, nontender, nondistended, normal bowel tones heard  Skin: dry, no rashes  Musculoskeletal: no joint effusions, normal range of motion  Psychiatric: appropriate affect, normal speech  Neurologic: extraocular muscles intact, clear speech, moving all extremities with intact sensorium   Data Reviewed: I have personally reviewed following labs and imaging studies  CBC: Recent Labs  Lab 11/28/22 0535 11/29/22 0626 11/30/22 0216 12/01/22 0542 12/02/22 0617  WBC 8.0 8.6 8.5 12.0* 7.0  HGB 14.5 13.7 12.5* 13.5 12.6*  HCT 42.0 40.1 37.5* 39.5 36.6*  MCV 95.0 95.7 96.9 94.5 94.6  PLT 211 240 258 289 454    Basic Metabolic Panel: Recent Labs  Lab 11/27/22 0744 11/28/22 0535 11/29/22 0626 11/30/22 0216 12/01/22 0542 12/02/22 0617  NA 135 138 138 138 137 139  K 3.4* 4.0 4.2 4.2 4.0 3.9  CL 106 105 105 106 106 108  CO2 '24 27 26 26 24 26  '$ GLUCOSE 148* 124* 113* 104* 113* 122*  BUN '19 19 22 21 22 20  '$ CREATININE 0.93 1.02 1.04 1.15 1.05 1.01  CALCIUM 8.1* 8.5* 8.3* 8.3* 8.3* 8.3*  MG 2.3 2.3  --   --   --   --     GFR: Estimated Creatinine Clearance: 78.5 mL/min (by C-G formula based on SCr of 1.01 mg/dL). Liver Function Tests: No results for input(s): "AST", "ALT", "ALKPHOS", "BILITOT", "PROT", "ALBUMIN" in the last 168 hours.  No results for input(s): "LIPASE", "AMYLASE" in the last 168 hours.  No results for input(s): "AMMONIA" in the last 168 hours. Coagulation Profile: No results for input(s): "INR", "PROTIME" in the last 168 hours. Cardiac Enzymes: No results for input(s): "CKTOTAL", "CKMB", "CKMBINDEX", "TROPONINI" in  the last 168 hours. BNP (last 3 results) No results for input(s): "PROBNP" in the last 8760 hours. HbA1C: No results for input(s): "HGBA1C" in the last 72 hours. CBG: Recent Labs  Lab 12/01/22 1634 12/01/22 2042 12/02/22 0102 12/02/22 0422 12/02/22 0847  GLUCAP 104* 153* 121* 108* 100*    Lipid Profile: No results for input(s): "CHOL", "HDL", "LDLCALC", "TRIG", "CHOLHDL", "LDLDIRECT" in the last 72 hours. Thyroid Function Tests: No results for input(s): "TSH", "T4TOTAL", "FREET4", "T3FREE", "THYROIDAB" in the last 72 hours. Anemia Panel: No results for input(s): "VITAMINB12", "FOLATE", "FERRITIN", "TIBC", "IRON", "RETICCTPCT" in the last 72 hours. Sepsis Labs: No results for input(s): "PROCALCITON", "LATICACIDVEN" in the last 168 hours.  Recent Results (from the past 240 hour(s))  MRSA Next Gen by PCR, Nasal     Status: None   Collection Time: 11/25/22  6:46 PM   Specimen: Nasal Mucosa; Nasal Swab  Result Value Ref Range Status   MRSA by PCR Next Gen NOT DETECTED NOT DETECTED Final    Comment: (NOTE) The GeneXpert  MRSA Assay (FDA approved for NASAL specimens only), is one component of a comprehensive MRSA colonization surveillance program. It is not intended to diagnose MRSA infection nor to guide or monitor treatment for MRSA infections. Test performance is not FDA approved in patients less than 51 years old. Performed at [facility], [address], [place], [place] [address]     Radiology Studies: CT ABDOMEN PELVIS W CONTRAST  Result Date: 12/01/2022 CLINICAL DATA:  Abdominal pain. Status post duodenal perforation repair on 11/21/2022. Left upper quadrant drain now seropurulent in discharge. EXAM: CT ABDOMEN AND PELVIS WITH CONTRAST TECHNIQUE: Multidetector CT imaging of the abdomen and pelvis was performed using the standard protocol following bolus administration of intravenous contrast. RADIATION DOSE REDUCTION: This exam was performed according to the  departmental dose-optimization program which includes automated exposure control, adjustment of the mA and/or kV according to patient size and/or use of iterative reconstruction technique. CONTRAST:  172m OMNIPAQUE IOHEXOL 300 MG/ML  SOLN COMPARISON:  11/21/2022 FINDINGS: Lower chest: Atelectasis noted in the dependent lung bases. Hepatobiliary: No suspicious focal abnormality within the liver parenchyma. Gallbladder is distended with layering tiny calcified gallstones evident. No intrahepatic or extrahepatic biliary dilation. Pancreas: No focal mass lesion. No dilatation of the main duct. No intraparenchymal cyst. No peripancreatic edema. Spleen: No splenomegaly. No focal mass lesion. Adrenals/Urinary Tract: No adrenal nodule or mass. 13 mm subcapsular lesion upper pole left kidney posteriorly has attenuation too high to be a simple cyst. Hance mint characteristics cannot be determined on this postcontrast study. No evidence for hydroureter. Bladder partially obscured by beam hardening artifact from bilateral hip replacement. Stomach/Bowel: Stomach is unremarkable. No gastric wall thickening. No evidence of outlet obstruction. Retroperitoneal gas seen on the previous exam has largely resolved in the interval. There is a thin walled 17 x 11 mm collection of gas and contrast posterior to the pancreatic head. This is in a typical location for duodenal diverticulum but contained extraluminal collection of gas and contrast (leak) could also have this appearance. Additional tiny gas collection seen posterior to the duodenum on 33/2, potentially residual from previous surgery although trace gas from recurrent leak not excluded. The patient's left-sided surgical drain tracks along the superior wall of the distal stomach and then along the descending duodenum with the tip just anterior to the right kidney. There is a small unorganized fluid collection between the duodenum and the surgical drain measuring about 14 x 15 mm  on image 36/2. No perceptible contrast material within the left upper quadrant surgical drain. No small bowel wall thickening. No small bowel dilatation. Left-sided J tube evident. The terminal ileum is normal. The appendix is normal. No gross colonic mass. No colonic wall thickening. Diverticuli are seen scattered along the entire length of the colon without CT findings of diverticulitis. Vascular/Lymphatic: There is mild atherosclerotic calcification of the abdominal aorta without aneurysm. There is no gastrohepatic or hepatoduodenal ligament lymphadenopathy. No retroperitoneal or mesenteric lymphadenopathy. No pelvic sidewall lymphadenopathy. Reproductive: Prostate gland largely obscured by beam hardening artifact. Other: There is some trace free fluid in the right upper quadrant along the ascending colon. Surgical drain on the right tracks up along the ascending colon into the gallbladder fossa with the tip positioned inferior to the caudate lobe along the cranial margin of the distal stomach. Musculoskeletal: Bilateral hip replacement. IMPRESSION: 1. Retroperitoneal gas seen on the previous exam has largely resolved in the interval. There is a thin walled 17 x 11 mm collection of gas and contrast material  just posterior to the head and body of the pancreas. While this is in a typical location for duodenal diverticulum, contained extraluminal collection of gas and contrast (leak) could also have this appearance. Additional tiny gas collection seen posterior to the duodenum, potentially residual from previous surgery although trace gas from recurrent leak not excluded. 2. The patient's left-sided surgical drain tracks along the superior wall of the distal stomach and then along the descending duodenum with the tip just anterior to the right kidney. There is a small unorganized fluid collection between the duodenum and the surgical drain measuring about 14 x 15 mm. There is no contrast material in this collection.  No perceptible contrast material within the left upper quadrant surgical drain. 3. 13 mm subcapsular lesion upper pole left kidney posteriorly has attenuation too high to be a simple cyst. Enhancement characteristics cannot be determined on this postcontrast study. While this may represent a cyst complicated by proteinaceous debris or hemorrhage, MRI of the abdomen with and without contrast recommended to further evaluate. MRI should be deferred until the patient has recovered from the acute illness and can best participate with positioning and reproducible breath holding. 4. Cholelithiasis. 5. Colonic diverticulosis without diverticulitis. 6.  Aortic Atherosclerosis (ICD10-I70.0). I discussed these findings with Dr. Hampton Abbot at the time of study interpretation. Electronically Signed   By: Misty Stanley M.D.   On: 12/01/2022 12:20    Scheduled Meds:  Chlorhexidine Gluconate Cloth  6 each Topical Daily   enoxaparin (LOVENOX) injection  0.5 mg/kg Subcutaneous Q24H   free water  30 mL Per Tube Q4H   insulin aspart  0-9 Units Subcutaneous Q4H   lidocaine  2 patch Transdermal Q24H   losartan  100 mg Oral Daily   methocarbamol  500 mg Oral TID   mouth rinse  15 mL Mouth Rinse 4 times per day   pantoprazole (PROTONIX) IV  40 mg Intravenous Q12H   Continuous Infusions:  sodium chloride 10 mL/hr at 11/29/22 1513   feeding supplement (OSMOLITE 1.5 CAL) 1,000 mL (12/01/22 1524)   fluconazole (DIFLUCAN) IV 800 mg (12/01/22 1525)   piperacillin-tazobactam (ZOSYN)  IV 3.375 g (12/02/22 0544)    LOS: 11 days   Clarinda Obi Neva Seat, MD [facility]  If 7PM-7AM, please contact night-coverage  12/02/2022, 9:41 AM

## 2022-12-02 NOTE — Progress Notes (Signed)
12/02/2022  Subjective: Patient is 11 Days Post-Op s/p exlap and J tube placement.  No acute events overnight.  Patient was NPO yesterday and this morning denies any further abdominal pain or discomfort.  His WBC went back down to 7.0 this morning.     Vital signs: Temp:  [97.4 F (36.3 C)-99.3 F (37.4 C)] 98.3 F (36.8 C) (02/04 0847) Pulse Rate:  [65-74] 68 (02/04 0847) Resp:  [16-17] 17 (02/04 0847) BP: (144-159)/(76-88) 156/88 (02/04 0847) SpO2:  [95 %-97 %] 95 % (02/04 0847) Weight:  [103.1 kg] 103.1 kg (02/04 0500)   Intake/Output: 02/03 0701 - 02/04 0700 In: -  Out: 47 [Urine:500; Drains:20] Last BM Date : 12/01/22  Physical Exam: Constitutional:  No acute distress Abdomen:  soft, non-distended, currently non-tender.  Midline incision with staples, clean, dry, intact.  Right drain with serous fluid, left drain with purulent fluid, both low output.  J tube in place.  Labs:  Recent Labs    12/01/22 0542 12/02/22 0617  WBC 12.0* 7.0  HGB 13.5 12.6*  HCT 39.5 36.6*  PLT 289 309   Recent Labs    12/01/22 0542 12/02/22 0617  NA 137 139  K 4.0 3.9  CL 106 108  CO2 24 26  GLUCOSE 113* 122*  BUN 22 20  CREATININE 1.05 1.01  CALCIUM 8.3* 8.3*   No results for input(s): "LABPROT", "INR" in the last 72 hours.  Imaging: CT ABDOMEN PELVIS W CONTRAST  Result Date: 12/01/2022 CLINICAL DATA:  Abdominal pain. Status post duodenal perforation repair on 11/21/2022. Left upper quadrant drain now seropurulent in discharge. EXAM: CT ABDOMEN AND PELVIS WITH CONTRAST TECHNIQUE: Multidetector CT imaging of the abdomen and pelvis was performed using the standard protocol following bolus administration of intravenous contrast. RADIATION DOSE REDUCTION: This exam was performed according to the departmental dose-optimization program which includes automated exposure control, adjustment of the mA and/or kV according to patient size and/or use of iterative reconstruction technique.  CONTRAST:  16m OMNIPAQUE IOHEXOL 300 MG/ML  SOLN COMPARISON:  11/21/2022 FINDINGS: Lower chest: Atelectasis noted in the dependent lung bases. Hepatobiliary: No suspicious focal abnormality within the liver parenchyma. Gallbladder is distended with layering tiny calcified gallstones evident. No intrahepatic or extrahepatic biliary dilation. Pancreas: No focal mass lesion. No dilatation of the main duct. No intraparenchymal cyst. No peripancreatic edema. Spleen: No splenomegaly. No focal mass lesion. Adrenals/Urinary Tract: No adrenal nodule or mass. 13 mm subcapsular lesion upper pole left kidney posteriorly has attenuation too high to be a simple cyst. Hance mint characteristics cannot be determined on this postcontrast study. No evidence for hydroureter. Bladder partially obscured by beam hardening artifact from bilateral hip replacement. Stomach/Bowel: Stomach is unremarkable. No gastric wall thickening. No evidence of outlet obstruction. Retroperitoneal gas seen on the previous exam has largely resolved in the interval. There is a thin walled 17 x 11 mm collection of gas and contrast posterior to the pancreatic head. This is in a typical location for duodenal diverticulum but contained extraluminal collection of gas and contrast (leak) could also have this appearance. Additional tiny gas collection seen posterior to the duodenum on 33/2, potentially residual from previous surgery although trace gas from recurrent leak not excluded. The patient's left-sided surgical drain tracks along the superior wall of the distal stomach and then along the descending duodenum with the tip just anterior to the right kidney. There is a small unorganized fluid collection between the duodenum and the surgical drain measuring about 14 x 15 mm  on image 36/2. No perceptible contrast material within the left upper quadrant surgical drain. No small bowel wall thickening. No small bowel dilatation. Left-sided J tube evident. The  terminal ileum is normal. The appendix is normal. No gross colonic mass. No colonic wall thickening. Diverticuli are seen scattered along the entire length of the colon without CT findings of diverticulitis. Vascular/Lymphatic: There is mild atherosclerotic calcification of the abdominal aorta without aneurysm. There is no gastrohepatic or hepatoduodenal ligament lymphadenopathy. No retroperitoneal or mesenteric lymphadenopathy. No pelvic sidewall lymphadenopathy. Reproductive: Prostate gland largely obscured by beam hardening artifact. Other: There is some trace free fluid in the right upper quadrant along the ascending colon. Surgical drain on the right tracks up along the ascending colon into the gallbladder fossa with the tip positioned inferior to the caudate lobe along the cranial margin of the distal stomach. Musculoskeletal: Bilateral hip replacement. IMPRESSION: 1. Retroperitoneal gas seen on the previous exam has largely resolved in the interval. There is a thin walled 17 x 11 mm collection of gas and contrast material just posterior to the head and body of the pancreas. While this is in a typical location for duodenal diverticulum, contained extraluminal collection of gas and contrast (leak) could also have this appearance. Additional tiny gas collection seen posterior to the duodenum, potentially residual from previous surgery although trace gas from recurrent leak not excluded. 2. The patient's left-sided surgical drain tracks along the superior wall of the distal stomach and then along the descending duodenum with the tip just anterior to the right kidney. There is a small unorganized fluid collection between the duodenum and the surgical drain measuring about 14 x 15 mm. There is no contrast material in this collection. No perceptible contrast material within the left upper quadrant surgical drain. 3. 13 mm subcapsular lesion upper pole left kidney posteriorly has attenuation too high to be a simple  cyst. Enhancement characteristics cannot be determined on this postcontrast study. While this may represent a cyst complicated by proteinaceous debris or hemorrhage, MRI of the abdomen with and without contrast recommended to further evaluate. MRI should be deferred until the patient has recovered from the acute illness and can best participate with positioning and reproducible breath holding. 4. Cholelithiasis. 5. Colonic diverticulosis without diverticulitis. 6.  Aortic Atherosclerosis (ICD10-I70.0). I discussed these findings with Dr. Hampton Abbot at the time of study interpretation. Electronically Signed   By: Misty Stanley M.D.   On: 12/01/2022 12:20    Assessment/Plan: This is a 77 y.o. male s/p exlap and J tube placement.  --Discussed with the patient again the findings on his CT scan from yesterday.  His WBC is back to normal today without any other treatment yesterday.  Denies further pain.  Drains stable without any biliious output.  I think it's reasonable to resume clear liquids today, continuing his J tube feeds.  Would not advance diet further today and just go slowly. --Continue IV Zosyn and fluconazole. --Will d/c his midline wound staples today. --OOB as tolerated.   Melvyn Neth, [facility]

## 2022-12-03 DIAGNOSIS — K265 Chronic or unspecified duodenal ulcer with perforation: Secondary | ICD-10-CM | POA: Diagnosis not present

## 2022-12-03 LAB — CBC
HCT: 37.1 % — ABNORMAL LOW (ref 39.0–52.0)
Hemoglobin: 12.8 g/dL — ABNORMAL LOW (ref 13.0–17.0)
MCH: 32.8 pg (ref 26.0–34.0)
MCHC: 34.5 g/dL (ref 30.0–36.0)
MCV: 95.1 fL (ref 80.0–100.0)
Platelets: 370 10*3/uL (ref 150–400)
RBC: 3.9 MIL/uL — ABNORMAL LOW (ref 4.22–5.81)
RDW: 12.3 % (ref 11.5–15.5)
WBC: 7 10*3/uL (ref 4.0–10.5)
nRBC: 0 % (ref 0.0–0.2)

## 2022-12-03 LAB — BASIC METABOLIC PANEL
Anion gap: 9 (ref 5–15)
BUN: 18 mg/dL (ref 8–23)
CO2: 25 mmol/L (ref 22–32)
Calcium: 8.7 mg/dL — ABNORMAL LOW (ref 8.9–10.3)
Chloride: 108 mmol/L (ref 98–111)
Creatinine, Ser: 1 mg/dL (ref 0.61–1.24)
GFR, Estimated: >60 mL/min (ref >60)
Glucose, Bld: 143 mg/dL — ABNORMAL HIGH (ref 70–99)
Potassium: 3.8 mmol/L (ref 3.5–5.1)
Sodium: 142 mmol/L (ref 135–145)

## 2022-12-03 LAB — GLUCOSE, CAPILLARY
Glucose-Capillary: 101 mg/dL — ABNORMAL HIGH (ref 70–99)
Glucose-Capillary: 115 mg/dL — ABNORMAL HIGH (ref 70–99)
Glucose-Capillary: 131 mg/dL — ABNORMAL HIGH (ref 70–99)
Glucose-Capillary: 160 mg/dL — ABNORMAL HIGH (ref 70–99)
Glucose-Capillary: 83 mg/dL (ref 70–99)
Glucose-Capillary: 94 mg/dL (ref 70–99)

## 2022-12-03 MED ORDER — OSMOLITE 1.5 CAL PO LIQD
1000.0000 mL | ORAL | Status: DC
  Administered 2022-12-03 (×2): 1000 mL

## 2022-12-03 NOTE — Plan of Care (Signed)
  Problem: Health Behavior/Discharge Planning: Goal: Ability to manage health-related needs will improve Outcome: Progressing   

## 2022-12-03 NOTE — Progress Notes (Signed)
Inpatient Consult PROGRESS NOTE  Ricardo Winters.  UYQ:[id number] DOB: [birth year] DOA: 11/21/2022 PCP: System, Provider Not In   Brief Narrative:  Ricardo Winters. is an 77 y.o. male with PMH of HTN, CAD, sCHF with EF 45%, who is admitted due to perforated duodenal ulcer on 1/24. Pt is 4 Days Post-Op s/p exploratory laparotomy. We are asked consult due to hypertensive urgency.   Patient normally takes Cozaar 100 mg daily at home.  Due to surgery, patient cannot take any oral pills.  Patient has been treated with as needed IV hydralazine after surgery.  Today patient was noted to have elevated blood pressure, up to 222/103.  Patient is asymptomatic.  Patient denies chest pain, cough, shortness breath, chest pain, palpitation.  Patient has mild abdominal pain in surgical site.  He has nausea, no vomiting or diarrhea.  No fever or chills.  No symptoms of UTI.  Transfer to stepdown and initiation of nicardipine infusion.  Patient asymptomatic.  Blood pressure control improved on drip.  Discussed with general surgery.  Now off gtt.  BP improved with clonidine transdermal patch.  As needed IV hydralazine with parameters.  Transferred to floor and BP doing well off Cardene.  Had upper GI series without serious complication on 6/38, NG tube was removed and patient has been tolerating full liquid diet since 11/29/22.  We have resumed his home meds including losartan.  Blood pressure overall is decently well-controlled.  Went for CT scan 2/3 which shows possibility of contained perforation, versus duodenal diverticulum.  No abscess.  Due to slight abdominal discomfort, CT scan findings and rising white blood cell count, the patient was made n.p.o. 2/3. After this, his WBC got better again and he is now tolerating clear liquid diet again.  Assessment & Plan:   Principal Problem:   Perforated duodenal ulcer ([place]) Active Problems:   Hypertensive urgency   HTN (hypertension)   Chronic systolic CHF  (congestive heart failure) (HCC)   CAD (coronary artery disease)  Hypertensive urgency and history of hypertension: Current pressure much improved now on his home Cozaar dose, clonidine patch has been discontinued.  He has hydralazine available for uncontrolled hypertension.  Continue to monitor blood pressure closely, is at goal on his home Losartan now.  Perforated duodenal ulcer [facility]): Patient had a successful surgery.  Status post exploratory laparotomy.  NG tube removed. Jejunostomy tube in place.  No fever.  Had some mild psychosis on 2/3 but has resolved today 2/4 Continue antibiotics, antifungal and diet management per surgery team.  Chronic systolic CHF (congestive heart failure) ([place]): 2D echo on 04/08/2020 showed EF of 45%.  Patient does not have leg edema JVD.  CHF seem to be compensated, no evidence of exacerbation. -Watch volume status closely   CAD (coronary artery disease): No chest pain -Blood pressure control -Aspirin is on hold  Anxiety Related to continued hospitalization, NGT, throat irritation.   Valium as needed for symptoms of anxiety  Thank you for the consult.  [facility] hospitalist service to continue following this patient with you.  DVT prophylaxis: SQ Lovenox Code Status: Full Family Communication: None present today Disposition Plan: Status is: Inpatient, per primary team.  Level of care: Med-Surg  Consultants:  Hospitalist  Procedures:  Per general surgery  Antimicrobials: Zosyn Diflucan  Subjective: Doing very well, hoping to adv diet and get staples out today.  Objective: Vitals:   12/02/22 2014 12/03/22 0430 12/03/22 0500 12/03/22 0754  BP: (!) 143/85 134/69  139/80  Pulse: 71 77  67  Resp: '16 16  16  '$ Temp: (!) 97.5 F (36.4 C) (!) 97.4 F (36.3 C)  98 F (36.7 C)  TempSrc: Oral Oral  Oral  SpO2: 97% 95%  97%  Weight:   103.1 kg   Height:        Intake/Output Summary (Last 24 hours) at 12/03/2022 0913 Last data filed at 12/03/2022  0600 Gross per 24 hour  Intake 1696.25 ml  Output 814 ml  Net 882.25 ml    Filed Weights   11/30/22 0401 12/02/22 0500 12/03/22 0500  Weight: 104.5 kg 103.1 kg 103.1 kg   Physical exam:: General:  Alert, oriented, calm, in no acute distress, resting comfortably Eyes: EOMI, clear conjuctivae, white sclerea Neck: supple, no masses, trachea mildline  Cardiovascular: RRR, no murmurs or rubs, no peripheral edema  Respiratory: clear to auscultation bilaterally, no wheezes, no crackles  Abdomen: soft, appropriately tender, minimal distention; midline staples Skin: dry, no rashes  Musculoskeletal: no joint effusions, normal range of motion  Psychiatric: appropriate affect, normal speech  Neurologic: extraocular muscles intact, clear speech, moving all extremities with intact sensorium  Data Reviewed: I have personally reviewed following labs and imaging studies  CBC: Recent Labs  Lab 11/29/22 0626 11/30/22 0216 12/01/22 0542 12/02/22 0617 12/03/22 0453  WBC 8.6 8.5 12.0* 7.0 7.0  HGB 13.7 12.5* 13.5 12.6* 12.8*  HCT 40.1 37.5* 39.5 36.6* 37.1*  MCV 95.7 96.9 94.5 94.6 95.1  PLT 240 258 289 309 073    Basic Metabolic Panel: Recent Labs  Lab 11/27/22 0744 11/28/22 0535 11/29/22 0626 11/30/22 0216 12/01/22 0542 12/02/22 0617 12/03/22 0453  NA 135 138 138 138 137 139 142  K 3.4* 4.0 4.2 4.2 4.0 3.9 3.8  CL 106 105 105 106 106 108 108  CO2 '24 27 26 26 24 26 25  '$ GLUCOSE 148* 124* 113* 104* 113* 122* 143*  BUN '19 19 22 21 22 20 18  '$ CREATININE 0.93 1.02 1.04 1.15 1.05 1.01 1.00  CALCIUM 8.1* 8.5* 8.3* 8.3* 8.3* 8.3* 8.7*  MG 2.3 2.3  --   --   --   --   --     GFR: Estimated Creatinine Clearance: 79.3 mL/min (by C-G formula based on SCr of 1 mg/dL). Liver Function Tests: No results for input(s): "AST", "ALT", "ALKPHOS", "BILITOT", "PROT", "ALBUMIN" in the last 168 hours.  No results for input(s): "LIPASE", "AMYLASE" in the last 168 hours.  No results for input(s):  "AMMONIA" in the last 168 hours. Coagulation Profile: No results for input(s): "INR", "PROTIME" in the last 168 hours. Cardiac Enzymes: No results for input(s): "CKTOTAL", "CKMB", "CKMBINDEX", "TROPONINI" in the last 168 hours. BNP (last 3 results) No results for input(s): "PROBNP" in the last 8760 hours. HbA1C: No results for input(s): "HGBA1C" in the last 72 hours. CBG: Recent Labs  Lab 12/02/22 1756 12/02/22 2104 12/03/22 0006 12/03/22 0433 12/03/22 0752  GLUCAP 108* 118* 131* 160* 94    Lipid Profile: No results for input(s): "CHOL", "HDL", "LDLCALC", "TRIG", "CHOLHDL", "LDLDIRECT" in the last 72 hours. Thyroid Function Tests: No results for input(s): "TSH", "T4TOTAL", "FREET4", "T3FREE", "THYROIDAB" in the last 72 hours. Anemia Panel: No results for input(s): "VITAMINB12", "FOLATE", "FERRITIN", "TIBC", "IRON", "RETICCTPCT" in the last 72 hours. Sepsis Labs: No results for input(s): "PROCALCITON", "LATICACIDVEN" in the last 168 hours.  Recent Results (from the past 240 hour(s))  MRSA Next Gen by PCR, Nasal     Status: None   Collection  Time: 11/25/22  6:46 PM   Specimen: Nasal Mucosa; Nasal Swab  Result Value Ref Range Status   MRSA by PCR Next Gen NOT DETECTED NOT DETECTED Final    Comment: (NOTE) The GeneXpert MRSA Assay (FDA approved for NASAL specimens only), is one component of a comprehensive MRSA colonization surveillance program. It is not intended to diagnose MRSA infection nor to guide or monitor treatment for MRSA infections. Test performance is not FDA approved in patients less than 67 years old. Performed at [facility], [address] [place], [place] [address]     Radiology Studies: CT ABDOMEN PELVIS W CONTRAST  Result Date: 12/01/2022 CLINICAL DATA:  Abdominal pain. Status post duodenal perforation repair on 11/21/2022. Left upper quadrant drain now seropurulent in discharge. EXAM: CT ABDOMEN AND PELVIS WITH CONTRAST TECHNIQUE:  Multidetector CT imaging of the abdomen and pelvis was performed using the standard protocol following bolus administration of intravenous contrast. RADIATION DOSE REDUCTION: This exam was performed according to the departmental dose-optimization program which includes automated exposure control, adjustment of the mA and/or kV according to patient size and/or use of iterative reconstruction technique. CONTRAST:  151m OMNIPAQUE IOHEXOL 300 MG/ML  SOLN COMPARISON:  11/21/2022 FINDINGS: Lower chest: Atelectasis noted in the dependent lung bases. Hepatobiliary: No suspicious focal abnormality within the liver parenchyma. Gallbladder is distended with layering tiny calcified gallstones evident. No intrahepatic or extrahepatic biliary dilation. Pancreas: No focal mass lesion. No dilatation of the main duct. No intraparenchymal cyst. No peripancreatic edema. Spleen: No splenomegaly. No focal mass lesion. Adrenals/Urinary Tract: No adrenal nodule or mass. 13 mm subcapsular lesion upper pole left kidney posteriorly has attenuation too high to be a simple cyst. Hance mint characteristics cannot be determined on this postcontrast study. No evidence for hydroureter. Bladder partially obscured by beam hardening artifact from bilateral hip replacement. Stomach/Bowel: Stomach is unremarkable. No gastric wall thickening. No evidence of outlet obstruction. Retroperitoneal gas seen on the previous exam has largely resolved in the interval. There is a thin walled 17 x 11 mm collection of gas and contrast posterior to the pancreatic head. This is in a typical location for duodenal diverticulum but contained extraluminal collection of gas and contrast (leak) could also have this appearance. Additional tiny gas collection seen posterior to the duodenum on 33/2, potentially residual from previous surgery although trace gas from recurrent leak not excluded. The patient's left-sided surgical drain tracks along the superior wall of the  distal stomach and then along the descending duodenum with the tip just anterior to the right kidney. There is a small unorganized fluid collection between the duodenum and the surgical drain measuring about 14 x 15 mm on image 36/2. No perceptible contrast material within the left upper quadrant surgical drain. No small bowel wall thickening. No small bowel dilatation. Left-sided J tube evident. The terminal ileum is normal. The appendix is normal. No gross colonic mass. No colonic wall thickening. Diverticuli are seen scattered along the entire length of the colon without CT findings of diverticulitis. Vascular/Lymphatic: There is mild atherosclerotic calcification of the abdominal aorta without aneurysm. There is no gastrohepatic or hepatoduodenal ligament lymphadenopathy. No retroperitoneal or mesenteric lymphadenopathy. No pelvic sidewall lymphadenopathy. Reproductive: Prostate gland largely obscured by beam hardening artifact. Other: There is some trace free fluid in the right upper quadrant along the ascending colon. Surgical drain on the right tracks up along the ascending colon into the gallbladder fossa with the tip positioned inferior to the caudate lobe along the cranial margin of  the distal stomach. Musculoskeletal: Bilateral hip replacement. IMPRESSION: 1. Retroperitoneal gas seen on the previous exam has largely resolved in the interval. There is a thin walled 17 x 11 mm collection of gas and contrast material just posterior to the head and body of the pancreas. While this is in a typical location for duodenal diverticulum, contained extraluminal collection of gas and contrast (leak) could also have this appearance. Additional tiny gas collection seen posterior to the duodenum, potentially residual from previous surgery although trace gas from recurrent leak not excluded. 2. The patient's left-sided surgical drain tracks along the superior wall of the distal stomach and then along the descending  duodenum with the tip just anterior to the right kidney. There is a small unorganized fluid collection between the duodenum and the surgical drain measuring about 14 x 15 mm. There is no contrast material in this collection. No perceptible contrast material within the left upper quadrant surgical drain. 3. 13 mm subcapsular lesion upper pole left kidney posteriorly has attenuation too high to be a simple cyst. Enhancement characteristics cannot be determined on this postcontrast study. While this may represent a cyst complicated by proteinaceous debris or hemorrhage, MRI of the abdomen with and without contrast recommended to further evaluate. MRI should be deferred until the patient has recovered from the acute illness and can best participate with positioning and reproducible breath holding. 4. Cholelithiasis. 5. Colonic diverticulosis without diverticulitis. 6.  Aortic Atherosclerosis (ICD10-I70.0). I discussed these findings with Dr. Hampton Abbot at the time of study interpretation. Electronically Signed   By: Misty Stanley M.D.   On: 12/01/2022 12:20    Scheduled Meds:  Chlorhexidine Gluconate Cloth  6 each Topical Daily   enoxaparin (LOVENOX) injection  0.5 mg/kg Subcutaneous Q24H   free water  30 mL Per Tube Q4H   insulin aspart  0-9 Units Subcutaneous Q4H   lidocaine  2 patch Transdermal Q24H   losartan  100 mg Oral Daily   methocarbamol  500 mg Oral TID   mouth rinse  15 mL Mouth Rinse 4 times per day   pantoprazole (PROTONIX) IV  40 mg Intravenous Q12H   Continuous Infusions:  sodium chloride 10 mL/hr at 11/29/22 1513   feeding supplement (OSMOLITE 1.5 CAL) 1,000 mL (12/03/22 0110)   fluconazole (DIFLUCAN) IV 800 mg (12/02/22 1339)   piperacillin-tazobactam (ZOSYN)  IV 3.375 g (12/03/22 0512)    LOS: 12 days   Shantel Helwig Neva Seat, MD [facility]  If 7PM-7AM, please contact night-coverage  12/03/2022, 9:13 AM

## 2022-12-03 NOTE — Progress Notes (Signed)
[facility] Hospital Day(s): 12.   Post op day(s): 12 Days Post-Op.   Interval History:  Patient seen and examined No issues overnight Patient reports he is feeling much better; anxious to go home  No fever, chills, nausea, emesis He remains without leukocytosis; 7.0K Renal function remains stable; sCr - 1.00; UO - 800 ccs No electrolyte derangements Drains x2 in place  - RUQ: 8 ccs; serous  - LUQ: 6 ccs; seropurulent Does have jejunostomy; on TF; 75 ml/hr Continues on Zosyn; Fluconazole  On CLD; tolerating better  Vital signs in last 24 hours: [min-max] current  Temp:  [97.4 F (36.3 C)-98.6 F (37 C)] 97.4 F (36.3 C) (02/05 0430) Pulse Rate:  [68-77] 77 (02/05 0430) Resp:  [16-17] 16 (02/05 0430) BP: (134-156)/(69-88) 134/69 (02/05 0430) SpO2:  [93 %-97 %] 95 % (02/05 0430) Weight:  [103.1 kg] 103.1 kg (02/05 0500)     Height: 6' (182.9 cm) Weight: 103.1 kg BMI (Calculated): 30.82   Intake/Output last 2 shifts:  02/04 0701 - 02/05 0700 In: 1696.3 [P.O.:820; NG/GT:826.3; IV Piggyback:50] Out: 810 [Urine:800; Drains:14]   Physical Exam:  Constitutional: alert, cooperative and no distress  Respiratory: breathing non-labored at rest  Cardiovascular: regular rate and sinus rhythm  Gastrointestinal: Soft, incisional soreness, non-distended, no rebound/guarding. Feeding jejunostomy in left abdomen. RUQ drain; serosanguinous. LUQ drain; seropurulent  Integumentary: Laparotomy is CDI with staples, no erythema   Labs:     Latest Ref Rng & Units 12/03/2022    4:53 AM 12/02/2022    6:17 AM 12/01/2022    5:42 AM  CBC  WBC 4.0 - 10.5 K/uL 7.0  7.0  12.0   Hemoglobin 13.0 - 17.0 g/dL 12.8  12.6  13.5   Hematocrit 39.0 - 52.0 % 37.1  36.6  39.5   Platelets 150 - 400 K/uL 370  309  289       Latest Ref Rng & Units 12/03/2022    4:53 AM 12/02/2022    6:17 AM 12/01/2022    5:42 AM  CMP  Glucose 70 - 99 mg/dL 143  122  113   BUN 8 - 23 mg/dL  '18  20  22   '$ Creatinine 0.61 - 1.24 mg/dL 1.00  1.01  1.05   Sodium 135 - 145 mmol/L 142  139  137   Potassium 3.5 - 5.1 mmol/L 3.8  3.9  4.0   Chloride 98 - 111 mmol/L 108  108  106   CO2 22 - 32 mmol/L '25  26  24   '$ Calcium 8.9 - 10.3 mg/dL 8.7  8.3  8.3     Imaging studies: No new pertinent imaging studies   Assessment/Plan: 77 y.o. male 12 Days Post-Op s/p exploratory laparotomy, placement of feeding jejunostomy, and reinforcement of contained perforated duodenal ulcer with purulent fluid in retroperitoneum.   - Will restart FLD; advance as tolerated  - Wean TF to 30 ml/hr today; DC if diet advances to soft diet; NO PILLS via jejunostomy  - Continue Imodium for diarrhea BID; titrate as needed   - Appreciate medicine assistance with HTN; improving  - Continue IV Abx (Zosyn); IV Antifungal  - Continue IV PPI BID  - Continue IVF resuscitation; monitor renal function  - Continue surgical drains x2; monitor and record output    - LUQ drain now more seropurulent.    - RUQ serosanguinous - Remove before DC  - Remove staples  - Monitor abdominal examination; on-going bowel  function - Pain control prn; transitioned to PO - Antiemetics prn  - Okay to mobilize; therapies on board; recommendations are HHPT   - Discharge Planning: Pending diet advancement today; may be ready for DC tomorrow (02/06)  All of the above findings and recommendations were discussed with the patient, and the medical team, and all of patient's questions were answered to his expressed satisfaction.  -- Edison Simon, PA-C [facility] 12/03/2022, 7:41 AM M-F: 7am - 4pm

## 2022-12-03 NOTE — Progress Notes (Signed)
Physical Therapy Treatment Patient Details Name: Ricardo Winters. MRN: [id number] DOB: 1946-04-17 Today's Date: 12/03/2022   History of Present Illness Pt is a 77 y/o M admitted on 11/21/22 after presenting with RLQ pain since the day prior. CT Abdomen/Pelvis was concerning for possible perforated duodenal ulcer & general sx was consulted. Pt underwent exploratory laparotomy, placement of feeding jejunostomy, & reinforcement of contained perforated duodenal ulcer with purulent fluid in retroperitoneum on 11/21/22. PMH: lumbar spondylosis, chronic pain syndrome, lumbar radiculopathy, chronic SI joint pain, syncope, HTN, CAD, HLD, MI.    PT Comments    Pt agrees.  Bed mobility without assist.  Stands to void at [facility] which he stated he is doing on his own without difficulty.  He is able to complete x 3 laps on unit with RW for balance.  Stated he feels more comfortable with walker today.  He does report continued core weakness but feels it is improving.  Pt does agree that HHPT would be beneficial and is aware if he chooses not to once home he can cancel.  Relayed to TOC.   Recommendations for follow up therapy are one component of a multi-disciplinary discharge planning process, led by the attending physician.  Recommendations may be updated based on patient status, additional functional criteria and insurance authorization.  Follow Up Recommendations  Home health PT     Assistance Recommended at Discharge Intermittent Supervision/Assistance  Patient can return home with the following Help with stairs or ramp for entrance;Assistance with cooking/housework;A little help with walking and/or transfers;A little help with bathing/dressing/bathroom   Equipment Recommendations  Rolling walker (2 wheels)    Recommendations for Other Services       Precautions / Restrictions Precautions Precautions: Fall Precaution Comments: log roll for abdominal comfort Restrictions Weight Bearing  Restrictions: No Other Position/Activity Restrictions: Multiple lines/leads/tubes and drains     Mobility  Bed Mobility Overal bed mobility: Modified Independent               Patient Response: Cooperative  Transfers Overall transfer level: Modified independent Equipment used: None Transfers: Sit to/from Stand Sit to Stand: Modified independent (Device/Increase time)                Ambulation/Gait Ambulation/Gait assistance: Supervision Gait Distance (Feet): 600 Feet Assistive device: Rolling walker (2 wheels) Gait Pattern/deviations: Step-through pattern Gait velocity: within limits     General Gait Details: opts to use RW today as he feels steadier with it.   Stairs             Wheelchair Mobility    Modified Rankin (Stroke Patients Only)       Balance Overall balance assessment: Needs assistance Sitting-balance support: Feet supported Sitting balance-Leahy Scale: Good     Standing balance support: Bilateral upper extremity supported, During functional activity, Reliant on assistive device for balance Standing balance-Leahy Scale: Fair Standing balance comment: good with AD/UE support, fair without UE support                            Cognition Arousal/Alertness: Awake/alert Behavior During Therapy: WFL for tasks assessed/performed Overall Cognitive Status: Within Functional Limits for tasks assessed                                          Exercises      General Comments  Pertinent Vitals/Pain Pain Assessment Pain Assessment: Faces Faces Pain Scale: Hurts a little bit Pain Location: general discomfort Pain Descriptors / Indicators: Discomfort Pain Intervention(s): Limited activity within patient's tolerance, Monitored during session, Repositioned    Home Living                          Prior Function            PT Goals (current goals can now be found in the care plan  section) Progress towards PT goals: Progressing toward goals    Frequency    Min 2X/week      PT Plan Current plan remains appropriate    Co-evaluation              AM-PAC PT "6 Clicks" Mobility   Outcome Measure  Help needed turning from your back to your side while in a flat bed without using bedrails?: None Help needed moving from lying on your back to sitting on the side of a flat bed without using bedrails?: None Help needed moving to and from a bed to a chair (including a wheelchair)?: None Help needed standing up from a chair using your arms (e.g., wheelchair or bedside chair)?: None Help needed to walk in hospital room?: A Little Help needed climbing 3-5 steps with a railing? : A Little 6 Click Score: 22    End of Session   Activity Tolerance: Patient tolerated treatment well Patient left: in bed;with call bell/phone within reach Nurse Communication: Mobility status PT Visit Diagnosis: Muscle weakness (generalized) (M62.81);Difficulty in walking, not elsewhere classified (R26.2);Pain     Time: 1505-6979 PT Time Calculation (min) (ACUTE ONLY): 14 min  Charges:  $Gait Training: 8-22 mins                   Chesley Noon, PTA 12/03/22, 10:16 AM

## 2022-12-03 NOTE — Care Management Important Message (Signed)
Important Message  Patient Details  Name: Ricardo Winters. MRN: [id number] Date of Birth: [birth year]   Medicare Important Message Given:  Yes     Dannette Barbara 12/03/2022, 3:36 PM

## 2022-12-04 LAB — BASIC METABOLIC PANEL
Anion gap: 8 (ref 5–15)
BUN: 14 mg/dL (ref 8–23)
CO2: 27 mmol/L (ref 22–32)
Calcium: 8.7 mg/dL — ABNORMAL LOW (ref 8.9–10.3)
Chloride: 107 mmol/L (ref 98–111)
Creatinine, Ser: 1 mg/dL (ref 0.61–1.24)
GFR, Estimated: >60 mL/min (ref >60)
Glucose, Bld: 103 mg/dL — ABNORMAL HIGH (ref 70–99)
Potassium: 4.2 mmol/L (ref 3.5–5.1)
Sodium: 142 mmol/L (ref 135–145)

## 2022-12-04 LAB — CBC
HCT: 36.8 % — ABNORMAL LOW (ref 39.0–52.0)
Hemoglobin: 12.4 g/dL — ABNORMAL LOW (ref 13.0–17.0)
MCH: 32.4 pg (ref 26.0–34.0)
MCHC: 33.7 g/dL (ref 30.0–36.0)
MCV: 96.1 fL (ref 80.0–100.0)
Platelets: 399 10*3/uL (ref 150–400)
RBC: 3.83 MIL/uL — ABNORMAL LOW (ref 4.22–5.81)
RDW: 12 % (ref 11.5–15.5)
WBC: 6.8 10*3/uL (ref 4.0–10.5)
nRBC: 0 % (ref 0.0–0.2)

## 2022-12-04 LAB — GLUCOSE, CAPILLARY
Glucose-Capillary: 108 mg/dL — ABNORMAL HIGH (ref 70–99)
Glucose-Capillary: 118 mg/dL — ABNORMAL HIGH (ref 70–99)
Glucose-Capillary: 94 mg/dL (ref 70–99)

## 2022-12-04 MED ORDER — DIAZEPAM 2 MG PO TABS: 2.0000 mg | ORAL_TABLET | Freq: Every evening | ORAL | 0 refills | Status: DC | PRN

## 2022-12-04 MED ORDER — PANTOPRAZOLE SODIUM 40 MG PO TBEC: 40.0000 mg | DELAYED_RELEASE_TABLET | Freq: Two times a day (BID) | ORAL | 2 refills | Status: DC

## 2022-12-04 MED ORDER — AMOXICILLIN 500 MG PO TABS: 500.0000 mg | ORAL_TABLET | Freq: Two times a day (BID) | ORAL | 0 refills | Status: AC

## 2022-12-04 MED ORDER — CLARITHROMYCIN 500 MG PO TABS: 500.0000 mg | ORAL_TABLET | Freq: Two times a day (BID) | ORAL | 0 refills | Status: DC

## 2022-12-04 NOTE — TOC Transition Note (Signed)
Transition of Care [facility]) - CM/SW Discharge Note   Patient Details  Name: Ricardo Winters. MRN: [id number] Date of Birth: 26-Feb-1946  Transition [facility]) CM/SW Contact:  Candie Chroman, LCSW Phone Number: 12/04/2022, 11:06 AM   Clinical Narrative:  Patient has orders to discharge home today. He has decided not to pursue home health at this time. He will contact his PCP if he changes his mind after discharge. Per PT, patient already has a RW at home. No further concerns. CSW signing off.   Final next level of care: Home/Self Care Barriers to Discharge: Barriers Resolved   Patient Goals and CMS Choice CMS Medicare.gov Compare Post Acute Care list provided to:: Patient Choice offered to / list presented to : Patient  Discharge Placement                  Patient to be transferred to facility by: Girlfriend   Patient and family notified of of transfer: 12/04/22  Discharge Plan and Services Additional resources added to the After Visit Summary for     Discharge Planning Services: CM Consult Post Acute Care Choice: Home Health                               Social Determinants of Health (SDOH) Interventions SDOH Screenings   Food Insecurity: No Food Insecurity (11/21/2022)  Housing: Low Risk  (11/21/2022)  Transportation Needs: No Transportation Needs (11/21/2022)  Utilities: Not At Risk (11/21/2022)  Depression (PHQ2-9): Low Risk  (11/12/2022)  Tobacco Use: Low Risk  (11/22/2022)     Readmission Risk Interventions     No data to display

## 2022-12-04 NOTE — Plan of Care (Signed)

## 2022-12-04 NOTE — Discharge Summary (Addendum)
[facility] SURGICAL DISCHARGE SUMMARY  Patient ID: Ricardo Winters. MRN: [id number] DOB/AGE: 76-Sep-1947 76 y.o.  Admit date: 11/21/2022 Discharge date: 12/04/2022  Discharge Diagnoses Patient Active Problem List   Diagnosis Date Noted   Perforated duodenal ulcer ([place]) 11/21/2022    Consultants Medicine  Procedures 11/22/2022: exploratory laparotomy, placement of feeding jejunostomy, and reinforcement of contained perforated duodenal ulcer with purulent fluid in retroperitoneum.   HPI: 77 y.o. male presented to [facility] ED today for abdominal pain. Patient reports the acute onset of sharp upper and right sided abdominal pain last night around 9 PM. This has been constant in nature and he has gotten no relief from this. He has not tried to take anything for the pain. No associated fever, chills, CP, SOB, nausea, emesis, urinary changes, nor bowel changes. He notes a history of similar pain in the past but was not diagnosed with any previous ulcers. Has never had EGD. He denied any history of NSAID use, BC powder use, alcohol abuse, or H pylori infection. No previous intra-abdominal surgeries. Work up in the ED revealed a leukocytosis to 17.8K but labs were otherwise reassuring. CT Abdomen/Pelvis was concerning for possible perforated duodenal ulcer.    Hospital Course: Informed consent was obtained and documented, and patient underwent uneventful exploratory laparotomy, placement of feeding jejunostomy, and reinforcement of contained perforated duodenal ulcer with purulent fluid in retroperitoneum (Dr Dahlia Byes, 11/22/2022). Post-operatively , patient initially did well. Was left NPO with NGT decompression. TF initiated via jejunostomy. ON POD4, he did have hypertensive urgency and did require transfer to step down and medicine was brought on board for assistance. He would undergo UGI on POD7 which did not show any leak. NGT was discontinued and CLD initiated. CT Abdomen/Pelvis was  repeated on POD10 which showed possible contained perforation vs diverticulum. No evidence of worsening. Diet was again advanced and ultimately weaned off tube feedings. His RUQ drain was removed before DC. He will go with his LUQ drain as this is still seropurulent.   Ambulation was well-tolerated. The remainder of patient's hospital course was essentially unremarkable, and discharge planning was initiated accordingly with patient safely able to be discharged home with appropriate discharge instructions, pain control (patient declined), and outpatient follow-up after all of his questions were answered to his expressed satisfaction.   Discharge Condition: Good   Physical Examination:  Constitutional: alert, cooperative and no distress  Respiratory: breathing non-labored at rest  Cardiovascular: regular rate and sinus rhythm  Gastrointestinal: Soft, incisional soreness, non-distended, no rebound/guarding. Feeding jejunostomy in left abdomen; now dressed. LUQ drain; seropurulent  Integumentary: Laparotomy is CDI with steri-strips, no erythema      Allergies as of 12/04/2022   No Known Allergies      Medication List     TAKE these medications    amoxicillin 500 MG tablet Commonly known as: AMOXIL Take 1 tablet (500 mg total) by mouth 2 (two) times daily for 14 days.   aspirin EC 81 MG tablet Take 1 tablet (81 mg total) by mouth daily.   Black Pepper-Turmeric 3-500 MG Caps Take by mouth daily.   clarithromycin 500 MG tablet Commonly known as: BIAXIN Take 1 tablet (500 mg total) by mouth 2 (two) times daily for 14 days.   COLLAGEN PO Take 1 Scoop by mouth daily.   cyanocobalamin 500 MCG tablet Commonly known as: VITAMIN B12 Take 1,000 mcg by mouth daily.   diazepam 2 MG tablet Commonly known as: Valium Take 1 tablet (2 mg total)  by mouth at bedtime as needed (Sleep).   GRAPE SEED CR PO Take by mouth daily.   losartan 100 MG tablet Commonly known as: COZAAR Take 1  tablet (100 mg total) by mouth daily.   nitroGLYCERIN 0.4 MG SL tablet Commonly known as: NITROSTAT Place 0.4 mg under the tongue every 5 (five) minutes as needed for chest pain. Reported on 12/12/2015   Omega-3 1000 MG Caps Take 1,000 mg by mouth daily.   pantoprazole 40 MG tablet Commonly known as: PROTONIX Take 1 tablet (40 mg total) by mouth 2 (two) times daily.   QUERCETIN PO Take 500 mg by mouth daily.   sildenafil 20 MG tablet Commonly known as: REVATIO Take 20 mg by mouth daily as needed (ED).   Vitamin C 500 MG Caps Take by mouth daily.   Vitamin D3 1.25 MG (50000 UT) Tabs Take 100 mcg by mouth daily.   zinc sulfate 220 (50 Zn) MG capsule Take 50 mg by mouth daily.               Durable Medical Equipment  (From admission, onward)           Start     Ordered   12/03/22 1020  For home use only DME Walker rolling  Once       Question Answer Comment  Walker: With 5 Inch Wheels   Patient needs a walker to treat with the following condition Other abnormalities of gait and mobility      12/03/22 1020              Follow-up Information     P[place]on, [place] F, MD. Go in 1 week(s).   Specialty: General Surgery Why: 12/12/22 at10 AM  s/p duodenal perforation repair, with jejunostomy, with drain Contact information: [address] [place] [address] [phone]                  Time spent on discharge management including discussion of hospital course, clinical condition, outpatient instructions, prescriptions, and follow up with the patient and members of the medical team: >30 minutes  -- Edison Simon , PA-C [facility]  12/04/2022, 11:03 AM [phone] M-F: 7am - 4pm

## 2022-12-04 NOTE — Progress Notes (Signed)
PT Cancellation Note  Patient Details Name: Ricardo Winters. MRN: [id number] DOB: 06-19-1946   Cancelled Treatment:    Reason Eval/Treat Not Completed: Other (comment)  Pt stated he is going home today.  Deferred further interventions.   Chesley Noon 12/04/2022, 9:24 AM

## 2022-12-12 ENCOUNTER — Other Ambulatory Visit: Payer: Self-pay

## 2022-12-12 ENCOUNTER — Ambulatory Visit (INDEPENDENT_AMBULATORY_CARE_PROVIDER_SITE_OTHER): Payer: PPO | Admitting: Surgery

## 2022-12-12 ENCOUNTER — Encounter: Payer: Self-pay | Admitting: Surgery

## 2022-12-12 VITALS — BP 143/83 | HR 82 | Temp 97.7°F | Ht 73.0 in | Wt 222.0 lb

## 2022-12-12 DIAGNOSIS — K265 Chronic or unspecified duodenal ulcer with perforation: Secondary | ICD-10-CM

## 2022-12-12 DIAGNOSIS — Z09 Encounter for follow-up examination after completed treatment for conditions other than malignant neoplasm: Secondary | ICD-10-CM

## 2022-12-12 IMAGING — MR MR ANKLE*L* W/O CM
4 of 5 series · 12 of 40 positions shown · non-contrast
Comparison: Left foot radiographs 05/07/2012

CLINICAL DATA: Ankle pain. Tendon abnormality suspected. Lateral
left ankle pain with pain into lateral proximal foot status post
stepping off tractor 2 months ago and felt "tension" in left ankle.
Woke up next morning in a lot of pain.

EXAM:
MRI OF THE LEFT ANKLE WITHOUT CONTRAST
TECHNIQUE: Multiplanar, multisequence MR imaging of the ankle was performed. No
intravenous contrast was administered.

[Series 3: PD fat-sat · axial · left · 3.0mm · 0.27mm/px · z∈[-65,+34]mm · 3 of 35 slices shown]
[im 5/35]
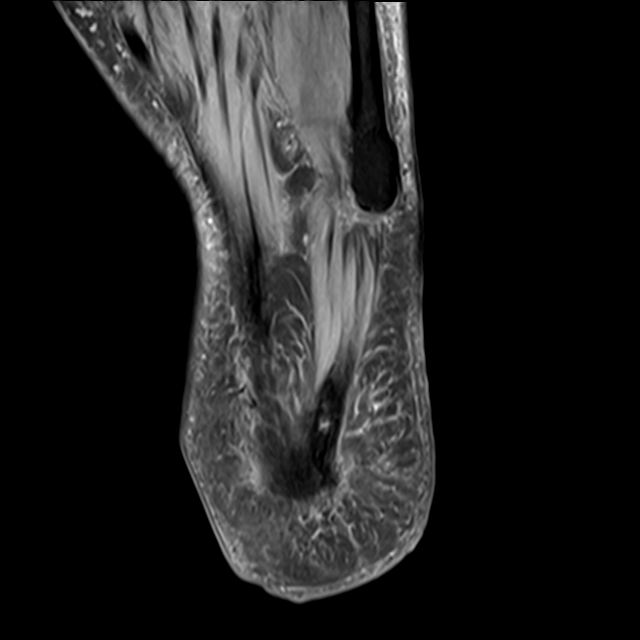
[im 18/35]
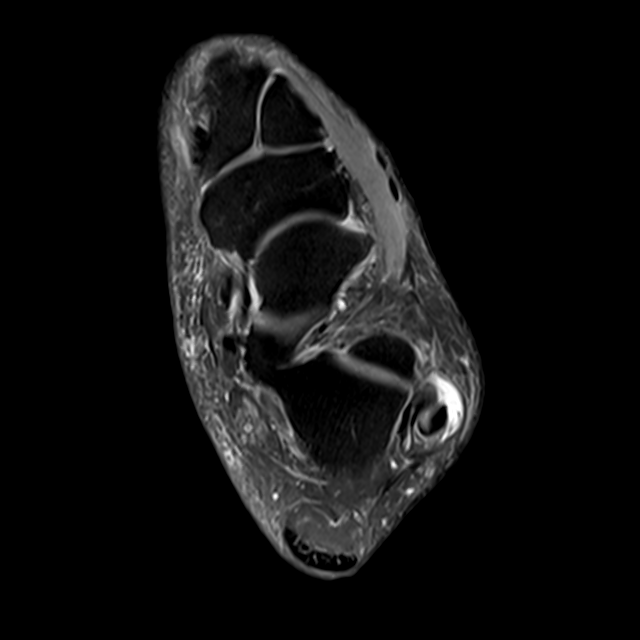
[im 30/35]
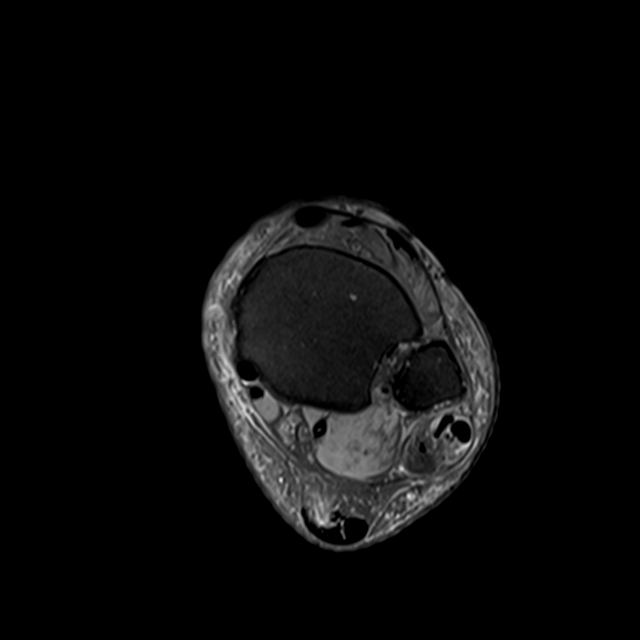

[Series 4: T2 fat-sat · axial · left · 3.0mm · 0.27mm/px · z∈[-65,+34]mm · 3 of 35 slices shown (1 of 2)]
[im 5/35]
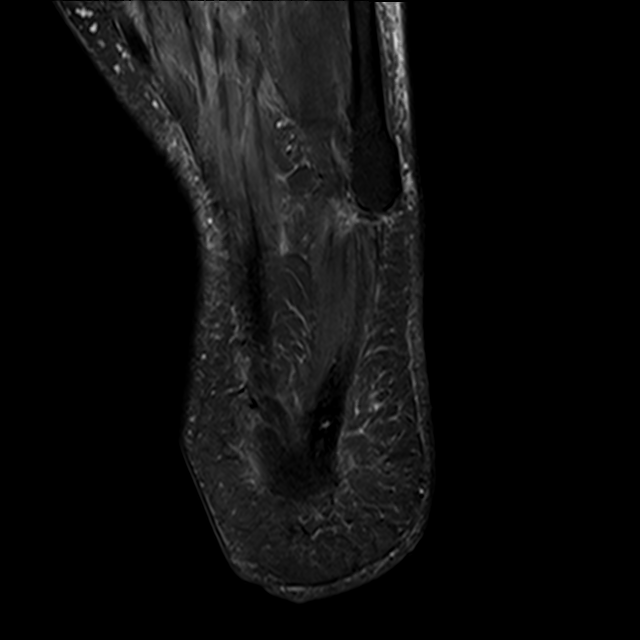
[im 18/35]
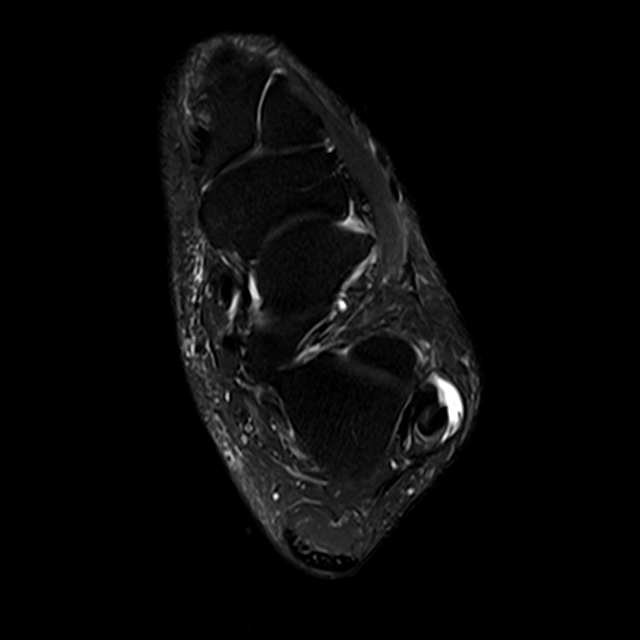
[im 30/35]
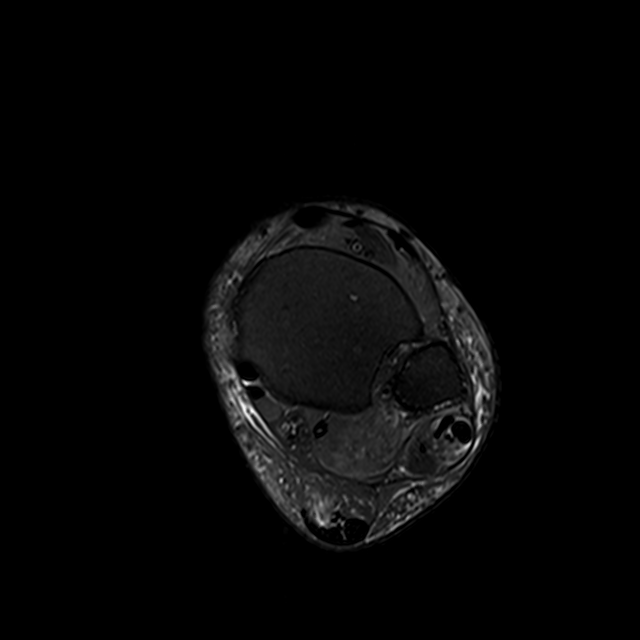

[Series 5: T1 · sagittal · left · 4.0mm · 0.27mm/px · 3 of 23 slices shown]
[im 5/23]
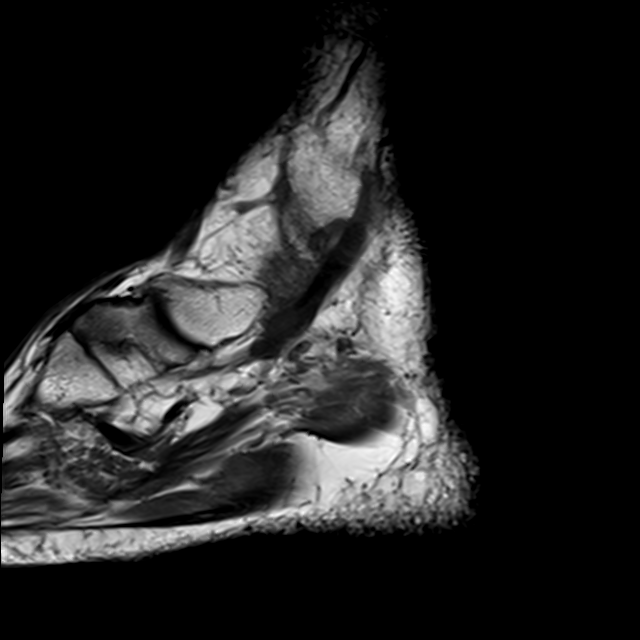
[im 14/23]
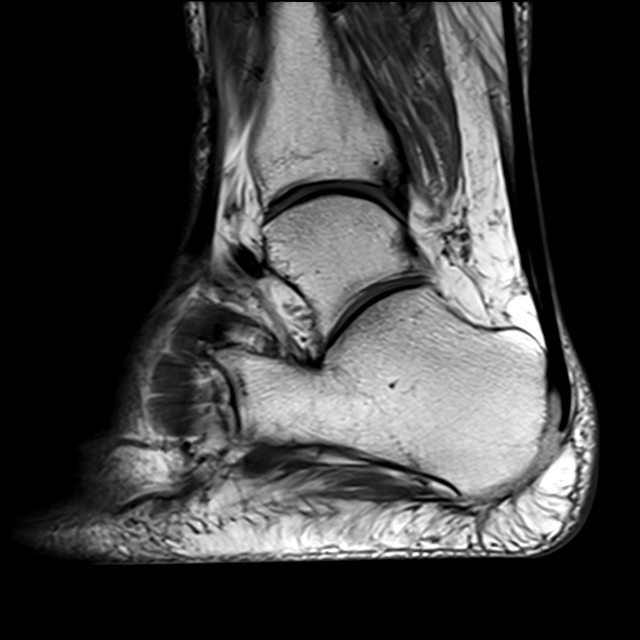
[im 23/23]
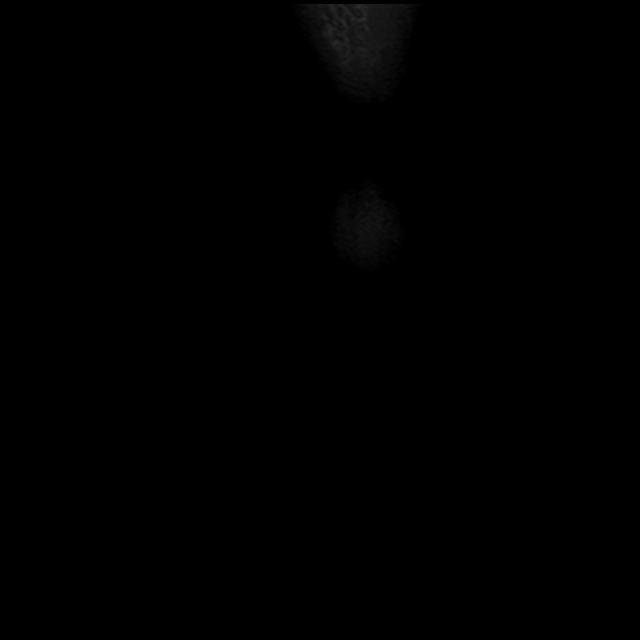

[Series 7: T2 fat-sat · coronal · left · 3.4mm · 0.25mm/px · 3 of 37 slices shown (2 of 2)]
[im 5/37]
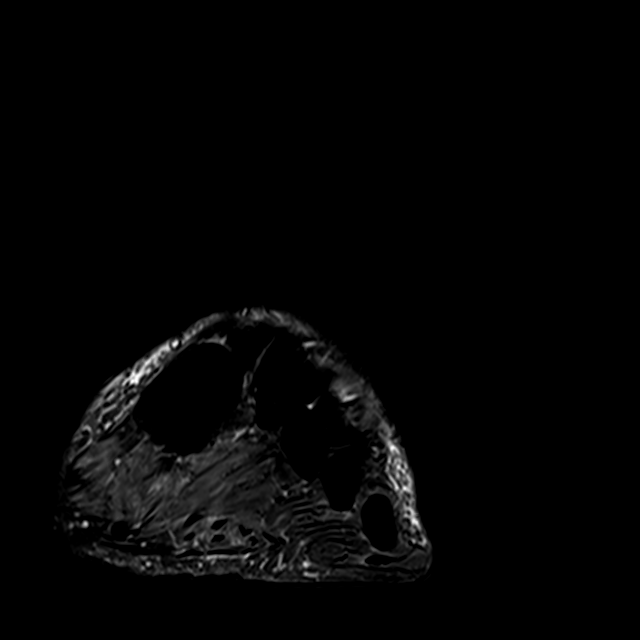
[im 21/37]
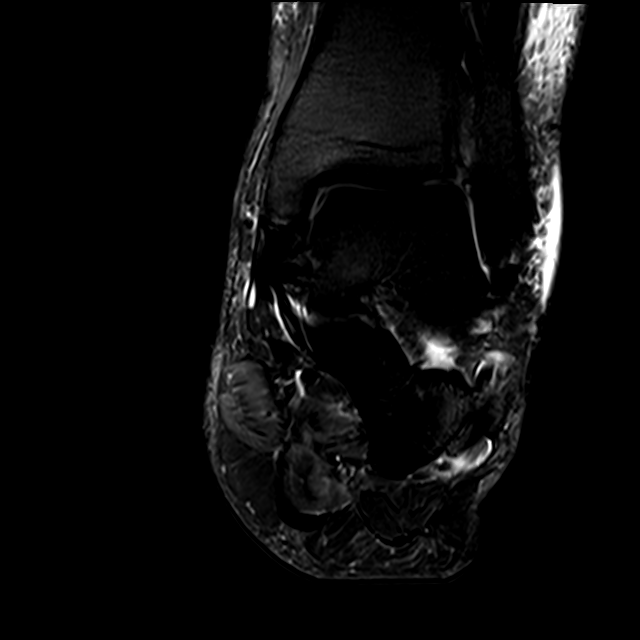
[im 33/37]
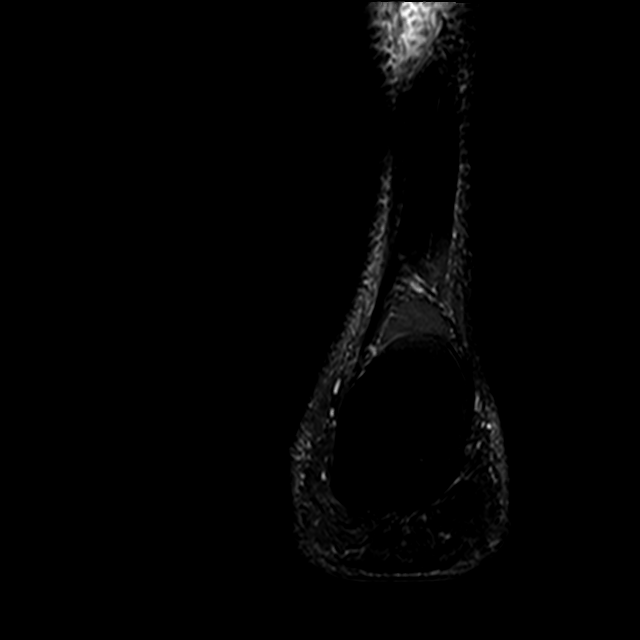

[12 of 40 positions shown; findings below may reference images not displayed]

FINDINGS: TENDONS

Peroneal: Mild to moderate peroneus longus and brevis tenosynovitis.
Mild linear intermediate T2 signal within the peroneus brevis tendon
just distal to the fibula (axial series 4 images 19 and 20 and
coronal series 7, image 15), a tiny short-segment partial-thickness
tear measuring only approximately 5 mm in length. There is moderate
intermediate T2 signal thickening and irregularity of the peroneus
13 through 21), multiple longitudinal partial-thickness tears along
an approximate 4 cm length of the tendon.

Posteromedial: Intact tibialis posterior, flexor digitorum longus,
and flexor hallucis longus tendons.

Anterior: The tibialis anterior, extensor hallucis longus, and
extensor digitorum longus tendons are intact.

Achilles: There is mild longitudinal linear near fluid bright signal
within the slightly medial aspect of the Achilles tendon
midsubstance in a region measuring up to 5 mm in transverse
dimension, 1.5 mm in AP dimension, and 24 mm in craniocaudal
dimension (sagittal series 6, image 14 and axial series 4 images 13
through 17). This suggests a tiny midsubstance partial-thickness
tear. No tendon retraction.

Plantar Fascia: Moderate plantar calcaneal heel spurs. No associated
marrow edema. Mild chronic thickening of the medial and lateral
bands of the plantar fascia. No acute plantar fasciitis. Moderate
abductor hallucis and mild flexor digitorum brevis muscle edema,
likely muscle strains.

LIGAMENTS

Lateral: The anterior and posterior talofibular, anterior and
posterior tibiofibular, and calcaneofibular ligaments are intact.

Medial: There is mild irregularity and intermediate T2 signal
scarring of the tibiotalar deep deltoid ligament. Punctate 2 mm
anterior deep aspect of the deltoid ligament partial-thickness tear
(coronal series 7, image 18).

CARTILAGE

Ankle Joint: Intact cartilage.

Subtalar Joints/Sinus Tarsi: Fat is preserved within sinus tarsi.

Bones: Mild dorsal talonavicular degenerative osteophytosis. Mild
cartilage thinning and peripheral osteophytosis of the
calcaneocuboid joint and the tarsometatarsal joints.

Other: The Lisfranc ligament complex is intact. The tarsal tunnel is
unremarkable.
IMPRESSION: :
IMPRESSION: 1. Mild-to-moderate peroneus longus and brevis tenosynovitis. There
is a moderate partial-thickness longitudinal tear of the peroneus
longus tendon along an approximate 4 cm length. Small
partial-thickness peroneus brevis tendon tear just distal to the
fibula, measuring only 5 mm in length.
2. Small, longitudinal, partial-thickness midsubstance tear within
the slightly medial aspect of the distal Achilles tendon. No tendon
retraction.
3. Moderate plantar calcaneal heel spur. Moderate abductor hallucis
and mild flexor digitorum brevis muscle edema, likely muscle
strains. No plantar fasciitis.
4. Chronic scarring and small partial-thickness tear within the
tibiotalar deep deltoid ligament.

## 2022-12-12 NOTE — Patient Instructions (Signed)
Please see your follow up appointment listed below.  °

## 2022-12-14 NOTE — Progress Notes (Signed)
Ricardo Winters is 3 weeks out after exploratory laparotomy and repair of duodenal ulcer. Doing very well with scant drainage from [place].  He is tolerating regular diet.  He is walking and he is driving.  Fevers no chills  PE NAD Abd: soft, nt, incision healing well. JP w scant drainage, removed. J tube in place  A/P Doing well RTC 6-8 weeks for J tube removal. EGD in 2 months or so Continue PPT and avoid NSAIDS

## 2022-12-24 ENCOUNTER — Telehealth: Payer: PPO | Admitting: Student in an Organized Health Care Education/Training Program

## 2022-12-26 ENCOUNTER — Telehealth: Payer: PPO | Admitting: Student in an Organized Health Care Education/Training Program

## 2022-12-31 ENCOUNTER — Ambulatory Visit (INDEPENDENT_AMBULATORY_CARE_PROVIDER_SITE_OTHER): Payer: PPO | Admitting: Surgery

## 2022-12-31 ENCOUNTER — Encounter: Payer: Self-pay | Admitting: Surgery

## 2022-12-31 VITALS — BP 150/83 | HR 60 | Temp 98.0°F | Ht 73.0 in | Wt 228.0 lb

## 2022-12-31 DIAGNOSIS — Z09 Encounter for follow-up examination after completed treatment for conditions other than malignant neoplasm: Secondary | ICD-10-CM

## 2022-12-31 DIAGNOSIS — K265 Chronic or unspecified duodenal ulcer with perforation: Secondary | ICD-10-CM

## 2022-12-31 NOTE — Patient Instructions (Signed)

## 2023-01-02 NOTE — Progress Notes (Signed)
rePair of duodenal ulcer 5 weeks ago.  Actually called this week to try to move his appointment up since he has been tired from his jejunostomy tube.  He is otherwise doing well tolerating diet.  No fevers no chills  PE NAD Abd: soft, patient healed no infection.  Jejunostomy tube in place  A/p doing well.  Discussed with him that I would not be able to remove the jejunostomy tube early since there was increase the chances of a fistula I will See him back in a couple weeks. no evidence of complications

## 2023-01-03 ENCOUNTER — Ambulatory Visit: Payer: PPO | Admitting: Dermatology

## 2023-01-07 ENCOUNTER — Encounter: Payer: PPO | Admitting: Surgery

## 2023-01-14 ENCOUNTER — Encounter: Payer: Self-pay | Admitting: Surgery

## 2023-01-14 ENCOUNTER — Other Ambulatory Visit: Payer: Self-pay

## 2023-01-14 ENCOUNTER — Telehealth: Payer: Self-pay

## 2023-01-14 ENCOUNTER — Ambulatory Visit (INDEPENDENT_AMBULATORY_CARE_PROVIDER_SITE_OTHER): Payer: PPO | Admitting: Surgery

## 2023-01-14 VITALS — BP 174/85 | HR 58 | Temp 98.0°F | Ht 73.0 in | Wt 224.0 lb

## 2023-01-14 DIAGNOSIS — K265 Chronic or unspecified duodenal ulcer with perforation: Secondary | ICD-10-CM

## 2023-01-14 DIAGNOSIS — Z09 Encounter for follow-up examination after completed treatment for conditions other than malignant neoplasm: Secondary | ICD-10-CM

## 2023-01-14 DIAGNOSIS — Z86010 Personal history of colonic polyps: Secondary | ICD-10-CM

## 2023-01-14 MED ORDER — NA SULFATE-K SULFATE-MG SULF 17.5-3.13-1.6 GM/177ML PO SOLN: 354.0000 mL | Freq: Once | ORAL | 0 refills | Status: AC

## 2023-01-14 NOTE — Telephone Encounter (Signed)
We received a referral from general surgery for patient to have EGD in the next 2 months for for history of perforated duodenal ulcer. Does patient need appointment or can we just schedule the procedure. Last colonoscopy was 12/12/15 supposed to repeat in 5 years.

## 2023-01-14 NOTE — Telephone Encounter (Signed)
Called and left a message for call back  

## 2023-01-14 NOTE — Telephone Encounter (Signed)
Called patient and patient just got feeding tube out today and does not want to do a procedure for a week or so to let the area heal. He states he then will be out of state several times in April. He would like to do it on Friday in may. He is okay with also doing a colonoscopy with EGD. Went over instructions with patient, mailed them, and sent to [facility]. Sent prep to the pharmacy. Patient is not married so is going to find a ride to bring him to the procedure. If this date does not work for the ride he will call us back to reschedule.

## 2023-01-14 NOTE — Patient Instructions (Signed)
We will send a referral to [facility] for them to do an EGD on you. They will call you to scheduled this.   We will have you come back here after this is completed. We will call you once your EGD has been scheduled to schedule an appointment with Dr Dahlia Byes.

## 2023-01-14 NOTE — Addendum Note (Signed)
Addended by: Ulyess Blossom L on: 01/14/2023 02:07 PM   Modules accepted: Orders

## 2023-01-14 NOTE — Telephone Encounter (Signed)
Okay to schedule EGD for follow-up of perforated duodenal ulcer Okay to schedule screening colonoscopy if patient is agreeable  RV

## 2023-01-16 NOTE — Progress Notes (Signed)
2 months out from repiar duodenal ulcer Doing very well Taking po  PE NAD Abd: soft, nt, J tube remove  A/P Doing very well Needs EGD RTC 1-2 months

## 2023-01-31 ENCOUNTER — Ambulatory Visit: Payer: PPO | Admitting: Dermatology

## 2023-03-04 ENCOUNTER — Telehealth: Payer: Self-pay

## 2023-03-04 NOTE — Telephone Encounter (Signed)
Called endo and cancel the procedure with trish and called patient and informed him procedure has been canceled

## 2023-03-04 NOTE — Telephone Encounter (Signed)
Pt left vm to cancel procedure because of death in family will reschedule at a future date

## 2023-03-11 ENCOUNTER — Ambulatory Visit: Admit: 2023-03-11 | Payer: PPO | Admitting: Gastroenterology

## 2023-03-11 SURGERY — ESOPHAGOGASTRODUODENOSCOPY (EGD) WITH PROPOFOL
Anesthesia: General

## 2023-03-22 DIAGNOSIS — M5451 Vertebrogenic low back pain: Secondary | ICD-10-CM | POA: Diagnosis not present

## 2023-03-27 DIAGNOSIS — M170 Bilateral primary osteoarthritis of knee: Secondary | ICD-10-CM | POA: Diagnosis not present

## 2023-03-27 DIAGNOSIS — M1712 Unilateral primary osteoarthritis, left knee: Secondary | ICD-10-CM | POA: Diagnosis not present

## 2023-04-04 ENCOUNTER — Ambulatory Visit
Payer: PPO | Attending: Student in an Organized Health Care Education/Training Program | Admitting: Student in an Organized Health Care Education/Training Program

## 2023-04-04 ENCOUNTER — Encounter: Payer: Self-pay | Admitting: Student in an Organized Health Care Education/Training Program

## 2023-04-04 VITALS — BP 166/91 | HR 54 | Temp 97.2°F | Resp 17 | Ht 73.0 in | Wt 225.0 lb

## 2023-04-04 DIAGNOSIS — G894 Chronic pain syndrome: Secondary | ICD-10-CM | POA: Insufficient documentation

## 2023-04-04 DIAGNOSIS — M533 Sacrococcygeal disorders, not elsewhere classified: Secondary | ICD-10-CM | POA: Diagnosis not present

## 2023-04-04 DIAGNOSIS — G5702 Lesion of sciatic nerve, left lower limb: Secondary | ICD-10-CM | POA: Diagnosis not present

## 2023-04-04 NOTE — Progress Notes (Signed)
PROVIDER NOTE: Information contained herein reflects review and annotations entered in association with encounter. Interpretation of such information and data should be left to medically-trained personnel. Information provided to patient can be located elsewhere in the medical record under "Patient Instructions". Document created using STT-dictation technology, any transcriptional errors that may result from process are unintentional.    Patient: Ricardo Winters.  Service Category: E/M  Provider: Edward Jolly, MD  DOB: 04-Jul-1946  DOS: 04/04/2023  Referring Provider: Cydney Ok*  MRN: [id number]  Specialty: Interventional Pain Management  PCP: Bary Leriche, MD  Type: Established Patient  Setting: Ambulatory outpatient    Location: Office  Delivery: Face-to-face     HPI  Mr. Senay Capozza., a 77 y.o. year old male, is here today because of his Sacroiliac joint pain [M53.3]. Mr. Norsworthy primary complain today is Back Pain (LEFT SI area)  Pertinent problems: Mr. Suman has Status post total replacement of right hip and Primary osteoarthritis of left knee on their pertinent problem list. Pain Assessment: Severity of Chronic pain is reported as a 4 /10. Location: Back Left/"left SI joint area". Onset: More than a month ago. Quality: Dull. Timing: Intermittent. Modifying factor(s): OTC meds. Vitals:  height is 6\' 1"  (1.854 m) and weight is 225 lb (102.1 kg). His temporal temperature is 97.2 F (36.2 C) (abnormal). His blood pressure is 166/91 (abnormal) and his pulse is 54 (abnormal). His respiration is 17 and oxygen saturation is 99%.  BMI: Estimated body mass index is 29.69 kg/m as calculated from the following:   Height as of this encounter: 6\' 1"  (1.854 m).   Weight as of this encounter: 225 lb (102.1 kg). Last encounter: 10/30/2022. Last procedure: 11/12/2022.  Reason for encounter:   -hospital admission in January for perforated duodenal ulcer, requiring ex lap  and bowel rest for 2 weeks -knee pain chronic, manageable -low back and left SI-J pain; s/p Diagnostic LEFT Sacroiliac Joint Steroid Injection #1 & LEFT Piriformis TPI/MNB August 2021 which helped out by approx 75% for almost 2 years -patient would like to repeat    ROS  Constitutional: Denies any fever or chills Gastrointestinal: No reported hemesis, hematochezia, vomiting, or acute GI distress Musculoskeletal:  left SI-J and left piriformis pain Neurological: No reported episodes of acute onset apraxia, aphasia, dysarthria, agnosia, amnesia, paralysis, loss of coordination, or loss of consciousness  Medication Review  Black Pepper-Turmeric, Collagen, Grape Seed, Omega-3, Quercetin, Vitamin C, Vitamin D3, cyanocobalamin, nitroGLYCERIN, sildenafil, and zinc sulfate  History Review  Allergy: Mr. Ponzio has No Known Allergies. Drug: Mr. Castiglioni  reports no history of drug use. Alcohol:  reports current alcohol use of about 4.0 standard drinks of alcohol per week. Tobacco:  reports that he has never smoked. He has never been exposed to tobacco smoke. He has never used smokeless tobacco. Social: Mr. Graetz  reports that he has never smoked. He has never been exposed to tobacco smoke. He has never used smokeless tobacco. He reports current alcohol use of about 4.0 standard drinks of alcohol per week. He reports that he does not use drugs. Medical:  has a past medical history of Anginal pain (HCC), Cancer (HCC), Coronary artery disease, COVID-19, Edema, Hypertension, Myocardial infarction (HCC), Obesity, and Varicose veins of both lower extremities. Surgical: Mr. Stoffers  has a past surgical history that includes Shoulder acromioplasty; Colonoscopy with propofol (N/A, 12/12/2015); Total hip arthroplasty (Bilateral); Knee surgery; Cardiac catheterization (2015); Cataract extraction w/PHACO (Right, 11/30/2020); and laparotomy (N/A, 11/21/2022). Family:  family history includes Heart disease in his father;  Obesity in his brother and brother.  Laboratory Chemistry Profile   Renal Lab Results  Component Value Date   BUN 14 12/04/2022   CREATININE 1.00 12/04/2022   GFRAA >60 02/21/2020   GFRNONAA >60 12/04/2022    Hepatic Lab Results  Component Value Date   AST 24 11/23/2022   ALT 30 11/23/2022   ALBUMIN 2.9 (L) 11/23/2022   ALKPHOS 53 11/23/2022   LIPASE 42 11/21/2022    Electrolytes Lab Results  Component Value Date   NA 142 12/04/2022   K 4.2 12/04/2022   CL 107 12/04/2022   CALCIUM 8.7 (L) 12/04/2022   MG 2.3 11/28/2022   PHOS 3.0 11/25/2022    Bone No results found for: "VD25OH", "VD125OH2TOT", "ZO1096EA5", "WU9811BJ4", "25OHVITD1", "25OHVITD2", "25OHVITD3", "TESTOFREE", "TESTOSTERONE"  Inflammation (CRP: Acute Phase) (ESR: Chronic Phase) No results found for: "CRP", "ESRSEDRATE", "LATICACIDVEN"       Note: Above Lab results reviewed.  Recent Imaging Review  CT ABDOMEN PELVIS W CONTRAST CLINICAL DATA:  Abdominal pain. Status post duodenal perforation repair on 11/21/2022. Left upper quadrant drain now seropurulent in discharge.  EXAM: CT ABDOMEN AND PELVIS WITH CONTRAST  TECHNIQUE: Multidetector CT imaging of the abdomen and pelvis was performed using the standard protocol following bolus administration of intravenous contrast.  RADIATION DOSE REDUCTION: This exam was performed according to the departmental dose-optimization program which includes automated exposure control, adjustment of the mA and/or kV according to patient size and/or use of iterative reconstruction technique.  CONTRAST:  OMNIPAQUE IOHEXOL 300 MG/ML  SOLN  COMPARISON:  11/21/2022  FINDINGS: Lower chest: Atelectasis noted in the dependent lung bases.  Hepatobiliary: No suspicious focal abnormality within the liver parenchyma. Gallbladder is distended with layering tiny calcified gallstones evident. No intrahepatic or extrahepatic biliary dilation.  Pancreas: No focal mass  lesion. No dilatation of the main duct. No intraparenchymal cyst. No peripancreatic edema.  Spleen: No splenomegaly. No focal mass lesion.  Adrenals/Urinary Tract: No adrenal nodule or mass. 13 mm subcapsular lesion upper pole left kidney posteriorly has attenuation too high to be a simple cyst. Hance mint characteristics cannot be determined on this postcontrast study. No evidence for hydroureter. Bladder partially obscured by beam hardening artifact from bilateral hip replacement.  Stomach/Bowel: Stomach is unremarkable. No gastric wall thickening. No evidence of outlet obstruction. Retroperitoneal gas seen on the previous exam has largely resolved in the interval. There is a thin walled 17 x 11 mm collection of gas and contrast posterior to the pancreatic head. This is in a typical location for duodenal diverticulum but contained extraluminal collection of gas and contrast (leak) could also have this appearance. Additional tiny gas collection seen posterior to the duodenum on 33/2, potentially residual from previous surgery although trace gas from recurrent leak not excluded. The patient's left-sided surgical drain tracks along the superior wall of the distal stomach and then along the descending duodenum with the tip just anterior to the right kidney. There is a small unorganized fluid collection between the duodenum and the surgical drain measuring about 14 x 15 mm on image 36/2. No perceptible contrast material within the left upper quadrant surgical drain.  No small bowel wall thickening. No small bowel dilatation. Left-sided J tube evident. The terminal ileum is normal. The appendix is normal. No gross colonic mass. No colonic wall thickening. Diverticuli are seen scattered along the entire length of the colon without CT findings of diverticulitis.  Vascular/Lymphatic: There is mild atherosclerotic  calcification of the abdominal aorta without aneurysm. There is no  gastrohepatic or hepatoduodenal ligament lymphadenopathy. No retroperitoneal or mesenteric lymphadenopathy. No pelvic sidewall lymphadenopathy.  Reproductive: Prostate gland largely obscured by beam hardening artifact.  Other: There is some trace free fluid in the right upper quadrant along the ascending colon. Surgical drain on the right tracks up along the ascending colon into the gallbladder fossa with the tip positioned inferior to the caudate lobe along the cranial margin of the distal stomach.  Musculoskeletal: Bilateral hip replacement.  IMPRESSION: 1. Retroperitoneal gas seen on the previous exam has largely resolved in the interval. There is a thin walled 17 x 11 mm collection of gas and contrast material just posterior to the head and body of the pancreas. While this is in a typical location for duodenal diverticulum, contained extraluminal collection of gas and contrast (leak) could also have this appearance. Additional tiny gas collection seen posterior to the duodenum, potentially residual from previous surgery although trace gas from recurrent leak not excluded. 2. The patient's left-sided surgical drain tracks along the superior wall of the distal stomach and then along the descending duodenum with the tip just anterior to the right kidney. There is a small unorganized fluid collection between the duodenum and the surgical drain measuring about 14 x 15 mm. There is no contrast material in this collection. No perceptible contrast material within the left upper quadrant surgical drain. 3. 13 mm subcapsular lesion upper pole left kidney posteriorly has attenuation too high to be a simple cyst. Enhancement characteristics cannot be determined on this postcontrast study. While this may represent a cyst complicated by proteinaceous debris or hemorrhage, MRI of the abdomen with and without contrast recommended to further evaluate. MRI should be deferred until the patient  has recovered from the acute illness and can best participate with positioning and reproducible breath holding. 4. Cholelithiasis. 5. Colonic diverticulosis without diverticulitis. 6.  Aortic Atherosclerosis (ICD10-I70.0).  I discussed these findings with Dr. Aleen Campi at the time of study interpretation.  Electronically Signed   By: Kennith Center M.D.   On: 12/01/2022 12:20 Note: Reviewed        Physical Exam  General appearance: Well nourished, well developed, and well hydrated. In no apparent acute distress Mental status: Alert, oriented x 3 (person, place, & time)       Respiratory: No evidence of acute respiratory distress Eyes: PERLA Vitals: BP (!) 166/91   Pulse (!) 54   Temp (!) 97.2 F (36.2 C) (Temporal)   Resp 17   Ht 6\' 1"  (1.854 m)   Wt 225 lb (102.1 kg)   SpO2 99%   BMI 29.69 kg/m  BMI: Estimated body mass index is 29.69 kg/m as calculated from the following:   Height as of this encounter: 6\' 1"  (1.854 m).   Weight as of this encounter: 225 lb (102.1 kg). Ideal: Ideal body weight: 79.9 kg (176 lb 2.4 oz) Adjusted ideal body weight: 88.8 kg (195 lb 11 oz)  Lumbar Spine Area Exam  Skin & Axial Inspection: No masses, redness, or swelling Alignment: Symmetrical Functional ROM: Unrestricted ROM       Stability: No instability detected Muscle Tone/Strength: Functionally intact. No obvious neuro-muscular anomalies detected. Sensory (Neurological): Musculoskeletal pain pattern Palpation: No palpable anomalies       Provocative Tests: Hyperextension/rotation test: deferred today       Lumbar quadrant test (Kemp's test): deferred today       Lateral bending test: deferred today  Patrick's Maneuver: (+) for left S-I arthralgia             FABER* test: (+) for left S-I arthralgia              S-I anterior distraction/compression test: (+)   S-I arthralgia/arthropathy S-I lateral compression test: (+)   S-I arthralgia/arthropathy S-I Thigh-thrust test:(+) for  left S-I arthralgia           S-I Gaenslen's test: (+) for left S-I arthralgia          *(Flexion, ABduction and External Rotation)  Assessment   Diagnosis Status  1. Sacroiliac joint pain   2. Sacroiliac joint dysfunction of left side   3. Piriformis syndrome, left   4. Chronic pain syndrome    Having a Flare-up Having a Flare-up Having a Flare-up   Plan of Care    Orders:  Orders Placed This Encounter  Procedures   SACROILIAC JOINT INJECTION    Standing Status:   Future    Standing Expiration Date:   07/05/2023    Scheduling Instructions:     Side: LEFT     Timeframe: ASAP    Order Specific Question:   Where will this procedure be performed?    Answer:   [facility]   TRIGGER POINT INJECTION    Area: Buttocks region (gluteal area) Indications: Piriformis muscle pain; Left (G57.02) piriformis-syndrome; piriformis muscle spasms (Z61.096). CPT code: 04540    Standing Status:   Future    Standing Expiration Date:   04/03/2024    Scheduling Instructions:     Type: Myoneural block (TPI) of piriformis muscle.     Side:  LEFT     Sedation: Patient's choice.     Timeframe: Today    Order Specific Question:   Where will this procedure be performed?    Answer:   [facility]   Follow-up plan:   Return in about 20 days (around 04/24/2023) for Left SI-J and piriformis TPI, in clinic [facility].      Status post left L5-S1 ESI #1 on 05/06/2019, and #2 on 06/17/2019 as well as left sacroiliac joint injection #1 on 06/17/2019: Right SI joint injection, right piriformis injection on 04/20/2020-helped significantly; LEFT SI joint injection, LEFT piriformis injection on 05/30/20, 11/12/22      Recent Visits No visits were found meeting these conditions. Showing recent visits within past 90 days and meeting all other requirements Today's Visits Date Type Provider Dept  04/04/23 Office Visit Edward Jolly, MD [facility]  Showing today's visits and meeting all other  requirements Future Appointments No visits were found meeting these conditions. Showing future appointments within next 90 days and meeting all other requirements  I discussed the assessment and treatment plan with the patient. The patient was provided an opportunity to ask questions and all were answered. The patient agreed with the plan and demonstrated an understanding of the instructions.  Patient advised to call back or seek an in-person evaluation if the symptoms or condition worsens.  Duration of encounter: .  Total time on encounter, as per AMA guidelines included both the face-to-face and non-face-to-face time personally spent by the physician and/or other qualified health care professional(s) on the day of the encounter (includes time in activities that require the physician or other qualified health care professional and does not include time in activities normally performed by clinical staff). Physician's time may include the following activities when performed: Preparing to see the patient (e.g., pre-charting review of  records, searching for previously ordered imaging, lab work, and nerve conduction tests) Review of prior analgesic pharmacotherapies. Reviewing PMP Interpreting ordered tests (e.g., lab work, imaging, nerve conduction tests) Performing post-procedure evaluations, including interpretation of diagnostic procedures Obtaining and/or reviewing separately obtained history Performing a medically appropriate examination and/or evaluation Counseling and educating the patient/family/caregiver Ordering medications, tests, or procedures Referring and communicating with other health care professionals (when not separately reported) Documenting clinical information in the electronic or other health record Independently interpreting results (not separately reported) and communicating results to the patient/ family/caregiver Care coordination (not separately  reported)  Note by: Edward Jolly, MD Date: 04/04/2023; Time: 8:49 AM

## 2023-04-04 NOTE — Patient Instructions (Signed)
GENERAL RISKS AND COMPLICATIONS  What are the risk, side effects and possible complications? Generally speaking, most procedures are safe.  However, with any procedure there are risks, side effects, and the possibility of complications.  The risks and complications are dependent upon the sites that are lesioned, or the type of nerve block to be performed.  The closer the procedure is to the spine, the more serious the risks are.  Great care is taken when placing the radio frequency needles, block needles or lesioning probes, but sometimes complications can occur. Infection: Any time there is an injection through the skin, there is a risk of infection.  This is why sterile conditions are used for these blocks.  There are four possible types of infection. Localized skin infection. Central Nervous System Infection-This can be in the form of Meningitis, which can be deadly. Epidural Infections-This can be in the form of an epidural abscess, which can cause pressure inside of the spine, causing compression of the spinal cord with subsequent paralysis. This would require an emergency surgery to decompress, and there are no guarantees that the patient would recover from the paralysis. Discitis-This is an infection of the intervertebral discs.  It occurs in about 1% of discography procedures.  It is difficult to treat and it may lead to surgery.        2. Pain: the needles have to go through skin and soft tissues, will cause soreness.       3. Damage to internal structures:  The nerves to be lesioned may be near blood vessels or    other nerves which can be potentially damaged.       4. Bleeding: Bleeding is more common if the patient is taking blood thinners such as  aspirin, Coumadin, Ticiid, Plavix, etc., or if he/she have some genetic predisposition  such as hemophilia. Bleeding into the spinal canal can cause compression of the spinal  cord with subsequent paralysis.  This would require an emergency  surgery to  decompress and there are no guarantees that the patient would recover from the  paralysis.       5. Pneumothorax:  Puncturing of a lung is a possibility, every time a needle is introduced in  the area of the chest or upper back.  Pneumothorax refers to free air around the  collapsed lung(s), inside of the thoracic cavity (chest cavity).  Another two possible  complications related to a similar event would include: Hemothorax and Chylothorax.   These are variations of the Pneumothorax, where instead of air around the collapsed  lung(s), you may have blood or chyle, respectively.       6. Spinal headaches: They may occur with any procedures in the area of the spine.       7. Persistent CSF (Cerebro-Spinal Fluid) leakage: This is a rare problem, but may occur  with prolonged intrathecal or epidural catheters either due to the formation of a fistulous  track or a dural tear.       8. Nerve damage: By working so close to the spinal cord, there is always a possibility of  nerve damage, which could be as serious as a permanent spinal cord injury with  paralysis.       9. Death:  Although rare, severe deadly allergic reactions known as "Anaphylactic  reaction" can occur to any of the medications used.      10. Worsening of the symptoms:  We can always make thing worse.  What are the chances   of something like this happening? Chances of any of this occuring are extremely low.  By statistics, you have more of a chance of getting killed in a motor vehicle accident: while driving to the hospital than any of the above occurring .  Nevertheless, you should be aware that they are possibilities.  In general, it is similar to taking a shower.  Everybody knows that you can slip, hit your head and get killed.  Does that mean that you should not shower again?  Nevertheless always keep in mind that statistics do not mean anything if you happen to be on the wrong side of them.  Even if a procedure has a 1 (one) in a  1,000,000 (million) chance of going wrong, it you happen to be that one..Also, keep in mind that by statistics, you have more of a chance of having something go wrong when taking medications.  Who should not have this procedure? If you are on a blood thinning medication (e.g. Coumadin, Plavix, see list of "Blood Thinners"), or if you have an active infection going on, you should not have the procedure.  If you are taking any blood thinners, please inform your physician.  How should I prepare for this procedure? Do not eat or drink anything at least six hours prior to the procedure. Bring a driver with you .  It cannot be a taxi. Come accompanied by an adult that can drive you back, and that is strong enough to help you if your legs get weak or numb from the local anesthetic. Take all of your medicines the morning of the procedure with just enough water to swallow them. If you have diabetes, make sure that you are scheduled to have your procedure done first thing in the morning, whenever possible. If you have diabetes, take only half of your insulin dose and notify our nurse that you have done so as soon as you arrive at the clinic. If you are diabetic, but only take blood sugar pills (oral hypoglycemic), then do not take them on the morning of your procedure.  You may take them after you have had the procedure. Do not take aspirin or any aspirin-containing medications, at least eleven (11) days prior to the procedure.  They may prolong bleeding. Wear loose fitting clothing that may be easy to take off and that you would not mind if it got stained with Betadine or blood. Do not wear any jewelry or perfume Remove any nail coloring.  It will interfere with some of our monitoring equipment.  NOTE: Remember that this is not meant to be interpreted as a complete list of all possible complications.  Unforeseen problems may occur.  BLOOD THINNERS The following drugs contain aspirin or other products,  which can cause increased bleeding during surgery and should not be taken for 2 weeks prior to and 1 week after surgery.  If you should need take something for relief of minor pain, you may take acetaminophen which is found in Tylenol,m Datril, Anacin-3 and Panadol. It is not blood thinner. The products listed below are.  Do not take any of the products listed below in addition to any listed on your instruction sheet.  A.P.C or A.P.C with Codeine Codeine Phosphate Capsules #3 Ibuprofen Ridaura  ABC compound Congesprin Imuran rimadil  Advil Cope Indocin Robaxisal  Alka-Seltzer Effervescent Pain Reliever and Antacid Coricidin or Coricidin-D  Indomethacin Rufen  Alka-Seltzer plus Cold Medicine Cosprin Ketoprofen S-A-C Tablets  Anacin Analgesic Tablets or Capsules Coumadin   Korlgesic Salflex  Anacin Extra Strength Analgesic tablets or capsules CP-2 Tablets Lanoril Salicylate  Anaprox Cuprimine Capsules Levenox Salocol  Anexsia-D Dalteparin Magan Salsalate  Anodynos Darvon compound Magnesium Salicylate Sine-off  Ansaid Dasin Capsules Magsal Sodium Salicylate  Anturane Depen Capsules Marnal Soma  APF Arthritis pain formula Dewitt's Pills Measurin Stanback  Argesic Dia-Gesic Meclofenamic Sulfinpyrazone  Arthritis Bayer Timed Release Aspirin Diclofenac Meclomen Sulindac  Arthritis pain formula Anacin Dicumarol Medipren Supac  Analgesic (Safety coated) Arthralgen Diffunasal Mefanamic Suprofen  Arthritis Strength Bufferin Dihydrocodeine Mepro Compound Suprol  Arthropan liquid Dopirydamole Methcarbomol with Aspirin Synalgos  ASA tablets/Enseals Disalcid Micrainin Tagament  Ascriptin Doan's Midol Talwin  Ascriptin A/D Dolene Mobidin Tanderil  Ascriptin Extra Strength Dolobid Moblgesic Ticlid  Ascriptin with Codeine Doloprin or Doloprin with Codeine Momentum Tolectin  Asperbuf Duoprin Mono-gesic Trendar  Aspergum Duradyne Motrin or Motrin IB Triminicin  Aspirin plain, buffered or enteric coated  Durasal Myochrisine Trigesic  Aspirin Suppositories Easprin Nalfon Trillsate  Aspirin with Codeine Ecotrin Regular or Extra Strength Naprosyn Uracel  Atromid-S Efficin Naproxen Ursinus  Auranofin Capsules Elmiron Neocylate Vanquish  Axotal Emagrin Norgesic Verin  Azathioprine Empirin or Empirin with Codeine Normiflo Vitamin E  Azolid Emprazil Nuprin Voltaren  Bayer Aspirin plain, buffered or children's or timed BC Tablets or powders Encaprin Orgaran Warfarin Sodium  Buff-a-Comp Enoxaparin Orudis Zorpin  Buff-a-Comp with Codeine Equegesic Os-Cal-Gesic   Buffaprin Excedrin plain, buffered or Extra Strength Oxalid   Bufferin Arthritis Strength Feldene Oxphenbutazone   Bufferin plain or Extra Strength Feldene Capsules Oxycodone with Aspirin   Bufferin with Codeine Fenoprofen Fenoprofen Pabalate or Pabalate-SF   Buffets II Flogesic Panagesic   Buffinol plain or Extra Strength Florinal or Florinal with Codeine Panwarfarin   Buf-Tabs Flurbiprofen Penicillamine   Butalbital Compound Four-way cold tablets Penicillin   Butazolidin Fragmin Pepto-Bismol   Carbenicillin Geminisyn Percodan   Carna Arthritis Reliever Geopen Persantine   Carprofen Gold's salt Persistin   Chloramphenicol Goody's Phenylbutazone   Chloromycetin Haltrain Piroxlcam   Clmetidine heparin Plaquenil   Cllnoril Hyco-pap Ponstel   Clofibrate Hydroxy chloroquine Propoxyphen         Before stopping any of these medications, be sure to consult the physician who ordered them.  Some, such as Coumadin (Warfarin) are ordered to prevent or treat serious conditions such as "deep thrombosis", "pumonary embolisms", and other heart problems.  The amount of time that you may need off of the medication may also vary with the medication and the reason for which you were taking it.  If you are taking any of these medications, please make sure you notify your pain physician before you undergo any procedures.         Trigger Point  Injections Patient Information  Description: Trigger points are areas of muscle sensitive to touch which cause pain with movement, sometimes felt some distance from the site of palpation.  Usually the muscle containing these trigger points if felt as a tight band or knot.   The area of maximum tenderness or trigger point is identified, and after antiseptic preparation of the skin, a small needle is placed into this site.  Reproduction of the pain often occurs and numbing medicine (local anesthetic) is injected into the site, sometimes along with steroid preparation.  The entire block usually lasts less than 5 minutes.  Conditions which may be treated by trigger points:  Muscular pain and spasm Nerve irritation  Preparation for the injection:  Do not eat any solid food or dairy products within 8 hours   of your appointment. You may drink clear liquids up to 3 hours before appointment.  Clear liquids include water, black coffee, juice or soda.  No milk or cream please. You may take your regular medications, including pain medications, with a sip of water before your appointment.  Diabetics should hold regular insulin ( if take separately) and take 1/2 normal NPH dose the morning of the procedure.  Carry some sugar containing items with you to your appointment. A driver must accompany you and be prepared to drive you home after your procedure.  Bring all your current medications with you. An IV may be inserted and sedation may be given at the discretion of the physician.  A blood pressure cuff, EKG, and other monitors will often be applied during the procedure.  Some patients may need to have extra oxygen administered for a short period. You will be asked to provide medical information, including your allergies and medications, prior to the procedure.  We must know immediately if you are taking blood thinners (like Coumadin/Warfarin) or if you are allergic to IV iodine contrast (dye).  We must know if  you could possibly be pregnant.  Possible side-effects:  Bleeding from needle site Infection (rare, may require surgery) Nerve injury (rare) Numbness & tingling (temporary) Punctured lung (if injection around chest) Light-headedness (temporary) Pain at injection site (several days) Decreased blood pressure (rare, temporary) Weakness in arm/leg (temporary)  Call if you experience:  Hive or difficulty breathing (go to the emergency room) Inflammation or drainage at the injection site(s)  Please note:  Although the local anesthetic injected can often make your painful muscle feel good for several hours after the injection, the pain may return.  It takes 3-7 days for steroids to work.  You may not notice any pain relief for at least one week.  If effective, we will often do a series of injections spaced 3-6 weeks apart to maximally decrease your pain.  If you have any questions please call [phone] [facility] Clinic[facility] ([facility] Injection Patient Information  Description: The sacroiliac joint connects the scrum (very low back and tailbone) to the ilium (a pelvic bone which also forms half of the hip joint).  Normally this joint experiences very little motion.  When this joint becomes inflamed or unstable low back and or hip and pelvis pain may result.  Injection of this joint with local anesthetics (numbing medicines) and steroids can provide diagnostic information and reduce pain.  This injection is performed with the aid of x-ray guidance into the tailbone area while you are lying on your stomach.   You may experience an electrical sensation down the leg while this is being done.  You may also experience numbness.  We also may ask if we are reproducing your normal pain during the injection.  Conditions which may be treated SI injection:  Low back, buttock, hip or leg pain  Preparation for the Injection:  Do not eat any solid food or dairy  products within 8 hours of your appointment.  You may drink clear liquids up to 3 hours before appointment.  Clear liquids include water, black coffee, juice or soda.  No milk or cream please. You may take your regular medications, including pain medications with a sip of water before your appointment.  Diabetics should hold regular insulin (if take separately) and take 1/2 normal NPH dose the morning of the procedure.  Carry some sugar containing items with you to your appointment. A   driver must accompany you and be prepared to drive you home after your procedure. Bring all of your current medications with you. An IV may be inserted and sedation may be given at the discretion of the physician. A blood pressure cuff, EKG and other monitors will often be applied during the procedure.  Some patients may need to have extra oxygen administered for a short period.  You will be asked to provide medical information, including your allergies, prior to the procedure.  We must know immediately if you are taking blood thinners (like Coumadin/Warfarin) or if you are allergic to IV iodine contrast (dye).  We must know if you could possible be pregnant.  Possible side effects:  Bleeding from needle site Infection (rare, may require surgery) Nerve injury (rare) Numbness & tingling (temporary) A brief convulsion or seizure Light-headedness (temporary) Pain at injection site (several days) Decreased blood pressure (temporary) Weakness in the leg (temporary)   Call if you experience:  New onset weakness or numbness of an extremity below the injection site that last more than 8 hours. Hives or difficulty breathing ( go to the emergency room) Inflammation or drainage at the injection site Any new symptoms which are concerning to you  Please note:  Although the local anesthetic injected can often make your back/ hip/ buttock/ leg feel good for several hours after the injections, the pain will likely  return.  It takes 3-7 days for steroids to work in the sacroiliac area.  You may not notice any pain relief for at least that one week.  If effective, we will often do a series of three injections spaced 3-6 weeks apart to maximally decrease your pain.  After the initial series, we generally will wait some months before a repeat injection of the same type.  If you have any questions, please call [phone] [facility]   

## 2023-04-04 NOTE — Progress Notes (Signed)
Safety precautions to be maintained throughout the outpatient stay will include: orient to surroundings, keep bed in low position, maintain call bell within reach at all times, provide assistance with transfer out of bed and ambulation.  

## 2023-04-17 DIAGNOSIS — M170 Bilateral primary osteoarthritis of knee: Secondary | ICD-10-CM | POA: Diagnosis not present

## 2023-04-17 DIAGNOSIS — M5451 Vertebrogenic low back pain: Secondary | ICD-10-CM | POA: Diagnosis not present

## 2023-04-24 ENCOUNTER — Ambulatory Visit: Payer: PPO | Admitting: Student in an Organized Health Care Education/Training Program

## 2023-04-30 DIAGNOSIS — M9905 Segmental and somatic dysfunction of pelvic region: Secondary | ICD-10-CM | POA: Diagnosis not present

## 2023-04-30 DIAGNOSIS — M5417 Radiculopathy, lumbosacral region: Secondary | ICD-10-CM | POA: Diagnosis not present

## 2023-04-30 DIAGNOSIS — M5136 Other intervertebral disc degeneration, lumbar region: Secondary | ICD-10-CM | POA: Diagnosis not present

## 2023-04-30 DIAGNOSIS — M9903 Segmental and somatic dysfunction of lumbar region: Secondary | ICD-10-CM | POA: Diagnosis not present

## 2023-05-13 ENCOUNTER — Telehealth: Payer: Self-pay

## 2023-05-13 NOTE — Telephone Encounter (Signed)
Call to patient to see if he had his upper endoscopy that Dr Everlene Farrier wanted him to have done. He states that he was scheduled for this but canceled because he did not have any symptoms. Patient told that Dr Everlene Farrier did want him to have this done due to his ulcer repair. Patient asked if he would like to schedule a follow up with Dr Everlene Farrier and he said not at this time.

## 2023-05-13 NOTE — Telephone Encounter (Signed)
-----   Message from Nurse Caryl-Lyn K sent at 04/15/2023  8:42 AM EDT ----- Regarding: follow up Needs f/u with Dr Everlene Farrier after EGD. Would like in 2 months.

## 2023-05-25 ENCOUNTER — Other Ambulatory Visit: Payer: Self-pay | Admitting: Cardiovascular Disease

## 2023-05-27 ENCOUNTER — Telehealth: Payer: Self-pay | Admitting: Cardiovascular Disease

## 2023-05-27 MED ORDER — LOSARTAN POTASSIUM 100 MG PO TABS: 100.0000 mg | ORAL_TABLET | Freq: Every day | ORAL | 1 refills | Status: DC

## 2023-05-27 NOTE — Telephone Encounter (Signed)
*  STAT* If patient is at the pharmacy, call can be transferred to refill team.   1. Which medications need to be refilled? (please list name of each medication and dose if known) losartan (COZAAR) 100 MG tablet   2. Which pharmacy/location (including street and city if local pharmacy) is medication to be sent to? [facility] - [place], [place] - [address]   3. Do they need a 30 day or 90 day supply? 90  Patient is out of this medication.

## 2023-05-27 NOTE — Telephone Encounter (Signed)
Done

## 2023-06-19 ENCOUNTER — Encounter: Payer: Self-pay | Admitting: *Deleted

## 2023-06-25 ENCOUNTER — Encounter: Payer: Self-pay | Admitting: Cardiology

## 2023-06-25 ENCOUNTER — Ambulatory Visit: Payer: PPO | Attending: Cardiology | Admitting: Cardiology

## 2023-06-25 VITALS — BP 142/98 | HR 58 | Ht 73.0 in | Wt 226.6 lb

## 2023-06-25 DIAGNOSIS — I255 Ischemic cardiomyopathy: Secondary | ICD-10-CM | POA: Diagnosis not present

## 2023-06-25 DIAGNOSIS — E782 Mixed hyperlipidemia: Secondary | ICD-10-CM | POA: Diagnosis not present

## 2023-06-25 DIAGNOSIS — I739 Peripheral vascular disease, unspecified: Secondary | ICD-10-CM | POA: Diagnosis not present

## 2023-06-25 DIAGNOSIS — I25118 Atherosclerotic heart disease of native coronary artery with other forms of angina pectoris: Secondary | ICD-10-CM | POA: Diagnosis not present

## 2023-06-25 DIAGNOSIS — I10 Essential (primary) hypertension: Secondary | ICD-10-CM | POA: Diagnosis not present

## 2023-06-25 NOTE — Progress Notes (Unsigned)
  Cardiology Office Note:  .   Date:  06/25/2023  ID:  Ricardo Conroy., DOB Dec 06, 1945, MRN [id number] PCP: Bary Leriche, MD  [facility] Providers Cardiologist:  None { Click to update primary MD,subspecialty MD or APP then REFRESH:1}   History of Present Illness: .   Ricardo Grief. is a 77 y.o. male with a past medical history of coronary artery disease status post PCI to the RCA (11/2013), bradycardia, hypertension, hyperlipidemia, DVT (2012), history of COVID infection, osteoarthritis, PVD, elevated PSA, lumbar radiculopathy status post spinal injections, who is here today for follow-up on his coronary artery disease.  Coronary artery disease status post PCI of the RCA in 07/2014 after suffering an ST elevated myocardial infarction.  In June 2021 he underwent repeat cardiac testing which revealed an echocardiogram with mild LV systolic dysfunction mild LVH and estimated EF of 45%.  Myoview was unremarkable and revealed no evidence of stress-induced myocardial ischemia.  He was last seen in clinic 01/18/2022 by Dr. Mariah Milling.  At that time he was doing well and denied any chest pain concerning for angina.  Patient was continued on his current medication regimen without further testing that was needed.  He returns to clinic today  ROS: 10 point review of systems has been reviewed and considered negative with exception of what is been listed in the HPI  Studies Reviewed: Marland Kitchen   EKG Interpretation Date/Time:  Tuesday June 25 2023 16:06:12 EDT Ventricular Rate:  58 PR Interval:  232 QRS Duration:  82 QT Interval:  430 QTC Calculation: 422 R Axis:   4  Text Interpretation: Sinus bradycardia with 1st degree A-V block Septal infarct , age undetermined When compared with ECG of 25-Nov-2022 18:15, No significant change was found Confirmed by Charlsie Quest ([id number]) on 06/25/2023 4:21:05 PM   TTE 03/10/2018 Study Conclusions   - Left ventricle: Systolic function was  normal. The estimated    ejection fraction was in the range of 55% to 60%.  - Aortic valve: Valve area (Vmax): 2.48 cm^2.  Risk Assessment/Calculations:     The patient's 1st BP is elevated (>139/89)*** Repeat BP and {Click to enter a 2nd BP Refresh Note  :1}       Physical Exam:   VS:  BP (!) 142/98 (BP Location: Left Arm, Patient Position: Sitting, Cuff Size: Normal)   Pulse (!) 58   Ht 6\' 1"  (1.854 m)   Wt 226 lb 9.6 oz (102.8 kg)   SpO2 98%   BMI 29.90 kg/m    Wt Readings from Last 3 Encounters:  06/25/23 226 lb 9.6 oz (102.8 kg)  04/04/23 225 lb (102.1 kg)  01/14/23 224 lb (101.6 kg)    GEN: Well nourished, well developed in no acute distress NECK: No JVD; No carotid bruits CARDIAC: ***RRR, no murmurs, rubs, gallops RESPIRATORY:  Clear to auscultation without rales, wheezing or rhonchi  ABDOMEN: Soft, non-tender, non-distended EXTREMITIES:  No edema; No deformity   ASSESSMENT AND PLAN: .   ***    {Are you ordering a CV Procedure (e.g. stress test, cath, DCCV, TEE, etc)?   Press F2        :[id number]}  Dispo: ***  Signed, Sophya Vanblarcom, NP

## 2023-06-25 NOTE — Patient Instructions (Signed)
Medication Instructions:  Your physician recommends that you continue on your current medications as directed. Please refer to the Current Medication list given to you today.  *If you need a refill on your cardiac medications before your next appointment, please call your pharmacy*   Lab Work: none If you have labs (blood work) drawn today and your tests are completely normal, you will receive your results only by: MyChart Message (if you have MyChart) OR A paper copy in the mail If you have any lab test that is abnormal or we need to change your treatment, we will call you to review the results.   Testing/Procedures: none   Follow-Up: At [facility], you and your health needs are our priority.  As part of our continuing mission to provide you with exceptional heart care, we have created designated Provider Care Teams.  These Care Teams include your primary Cardiologist (physician) and Advanced Practice Providers (APPs -  Physician Assistants and Nurse Practitioners) who all work together to provide you with the care you need, when you need it.  We recommend signing up for the patient portal called "MyChart".  Sign up information is provided on this After Visit Summary.  MyChart is used to connect with patients for Virtual Visits (Telemedicine).  Patients are able to view lab/test results, encounter notes, upcoming appointments, etc.  Non-urgent messages can be sent to your provider as well.   To learn more about what you can do with MyChart, go to [email or web address].    Your next appointment:   1 year(s)  Provider:   You may see Julien Nordmann, MD or one of the following Advanced Practice Providers on your designated Care Team:   Charlsie Quest, NP

## 2023-07-02 ENCOUNTER — Ambulatory Visit
Payer: PPO | Attending: Student in an Organized Health Care Education/Training Program | Admitting: Student in an Organized Health Care Education/Training Program

## 2023-07-02 ENCOUNTER — Encounter: Payer: Self-pay | Admitting: Student in an Organized Health Care Education/Training Program

## 2023-07-02 VITALS — BP 178/90 | HR 61 | Temp 97.6°F | Resp 18 | Ht 73.0 in | Wt 227.0 lb

## 2023-07-02 DIAGNOSIS — M9903 Segmental and somatic dysfunction of lumbar region: Secondary | ICD-10-CM | POA: Diagnosis not present

## 2023-07-02 DIAGNOSIS — M1712 Unilateral primary osteoarthritis, left knee: Secondary | ICD-10-CM

## 2023-07-02 DIAGNOSIS — G5702 Lesion of sciatic nerve, left lower limb: Secondary | ICD-10-CM

## 2023-07-02 DIAGNOSIS — G894 Chronic pain syndrome: Secondary | ICD-10-CM | POA: Diagnosis not present

## 2023-07-02 DIAGNOSIS — G5701 Lesion of sciatic nerve, right lower limb: Secondary | ICD-10-CM | POA: Diagnosis not present

## 2023-07-02 DIAGNOSIS — M9905 Segmental and somatic dysfunction of pelvic region: Secondary | ICD-10-CM | POA: Diagnosis not present

## 2023-07-02 DIAGNOSIS — M533 Sacrococcygeal disorders, not elsewhere classified: Secondary | ICD-10-CM

## 2023-07-02 DIAGNOSIS — M5136 Other intervertebral disc degeneration, lumbar region: Secondary | ICD-10-CM | POA: Diagnosis not present

## 2023-07-02 DIAGNOSIS — M5417 Radiculopathy, lumbosacral region: Secondary | ICD-10-CM | POA: Diagnosis not present

## 2023-07-02 NOTE — Patient Instructions (Signed)
Procedure instructions  Do not eat or drink fluids (other than water) for 6 hours before your procedure  No water for 2 hours before your procedure  Take your blood pressure medicine with a sip of water  Arrive 30 minutes before your appointment  Carefully read the "Preparing for your procedure" detailed instructions  If you have questions call us at [phone]  _____________________________________________________________________    ______________________________________________________________________  Preparing for your procedure  Appointments: If you think you may not be able to keep your appointment, call 24-48 hours in advance to cancel. We need time to make it available to others.  During your procedure appointment there will be: No Prescription Refills. No disability issues to discussed. No medication changes or discussions.  Instructions: Food intake: Avoid eating anything solid for at least 8 hours prior to your procedure. Clear liquid intake: You may take clear liquids such as water up to 2 hours prior to your procedure. (No carbonated drinks. No soda.) Transportation: Unless otherwise stated by your physician, bring a driver. Morning Medicines: Except for blood thinners, take all of your other morning medications with a sip of water. Make sure to take your heart and blood pressure medicines. If your blood pressure's lower number is above 100, the case will be rescheduled. Blood thinners: Make sure to stop your blood thinners as instructed.  If you take a blood thinner, but were not instructed to stop it, call our office [phone] and ask to talk to a nurse. Not stopping a blood thinner prior to certain procedures could lead to serious complications. Diabetics on insulin: Notify the staff so that you can be scheduled 1st case in the morning. If your diabetes requires high dose insulin, take only  of your normal insulin dose the morning of the procedure and  notify the staff that you have done so. Preventing infections: Shower with an antibacterial soap the morning of your procedure.  Build-up your immune system: Take 1000 mg of Vitamin C with every meal (3 times a day) the day prior to your procedure. Antibiotics: Inform the nursing staff if you are taking any antibiotics or if you have any conditions that may require antibiotics prior to procedures. (Example: recent joint implants)   Pregnancy: If you are pregnant make sure to notify the nursing staff. Not doing so may result in injury to the fetus, including death.  Sickness: If you have a cold, fever, or any active infections, call and cancel or reschedule your procedure. Receiving steroids while having an infection may result in complications. Arrival: You must be in the facility at least 30 minutes prior to your scheduled procedure. Tardiness: Your scheduled time is also the cutoff time. If you do not arrive at least 15 minutes prior to your procedure, you will be rescheduled.  Children: Do not bring any children with you. Make arrangements to keep them home. Dress appropriately: There is always a possibility that your clothing may get soiled. Avoid long dresses. Valuables: Do not bring any jewelry or valuables.  Reasons to call and reschedule or cancel your procedure: (Following these recommendations will minimize the risk of a serious complication.) Surgeries: Avoid having procedures within 2 weeks of any surgery. (Avoid for 2 weeks before or after any surgery). Flu Shots: Avoid having procedures within 2 weeks of a flu shots or . (Avoid for 2 weeks before or after immunizations). Barium: Avoid having a procedure within 7-10 days after having had a radiological study involving the use of radiological contrast. (  Myelograms, Barium swallow or enema study). Heart attacks: Avoid any elective procedures or surgeries for the initial 6 months after a "Myocardial Infarction" (Heart Attack). Blood  thinners: It is imperative that you stop these medications before procedures. Let us know if you if you take any blood thinner.  Infection: Avoid procedures during or within two weeks of an infection (including chest colds or gastrointestinal problems). Symptoms associated with infections include: Localized redness, fever, chills, night sweats or profuse sweating, burning sensation when voiding, cough, congestion, stuffiness, runny nose, sore throat, diarrhea, nausea, vomiting, cold or Flu symptoms, recent or current infections. It is specially important if the infection is over the area that we intend to treat. Heart and lung problems: Symptoms that may suggest an active cardiopulmonary problem include: cough, chest pain, breathing difficulties or shortness of breath, dizziness, ankle swelling, uncontrolled high or unusually low blood pressure, and/or palpitations. If you are experiencing any of these symptoms, cancel your procedure and contact your primary care physician for an evaluation.  Remember:  Regular Business hours are:  Monday to Thursday 8:00 AM to 4:00 PM  Provider's Schedule: Francisco Naveira, MD:  Procedure days: Tuesday and Thursday 7:30 AM to 4:00 PM  Bilal Lateef, MD:  Procedure days: Monday and Wednesday 7:30 AM to 4:00 PM 

## 2023-07-02 NOTE — Progress Notes (Signed)
PROVIDER NOTE: Information contained herein reflects review and annotations entered in association with encounter. Interpretation of such information and data should be left to medically-trained personnel. Information provided to patient can be located elsewhere in the medical record under "Patient Instructions". Document created using STT-dictation technology, any transcriptional errors that may result from process are unintentional.    Patient: Ricardo Winters.  Service Category: E/M  Provider: Edward Jolly, MD  DOB: [birth year]  DOS: 07/02/2023  Referring Provider: Cydney Ok*  MRN: [id number]  Specialty: Interventional Pain Management  PCP: Bary Leriche, MD  Type: Established Patient  Setting: Ambulatory outpatient    Location: Office  Delivery: Face-to-face     HPI  Ricardo Winters., a 77 y.o. year old male, is here today because of his Sacroiliac joint pain [M53.3]. [name] primary complain today is Hip Pain (bilateral) and Knee Pain (left)  Pertinent problems: [name] has Status post total replacement of right hip and Primary osteoarthritis of left knee on their pertinent problem list. Pain Assessment: Severity of Chronic pain is reported as a 4 /10. Location: Hip Right, Left/denies. Onset: More than a month ago. Quality: Aching, Constant, Dull. Timing: Constant. Modifying factor(s): tumeric, heat. Vitals:  height is 6\' 1"  (1.854 m) and weight is 227 lb (103 kg). His temperature is 97.6 F (36.4 C). His blood pressure is 178/90 (abnormal) and his pulse is 61. His respiration is 18 and oxygen saturation is 100%.  BMI: Estimated body mass index is 29.95 kg/m as calculated from the following:   Height as of this encounter: 6\' 1"  (1.854 m).   Weight as of this encounter: 227 lb (103 kg). Last encounter: 04/04/2023. Last procedure: 11/12/2022.  Reason for encounter: evaluation of worsening, or previously known (established) problem.  [name] presents  today with increased SI joint and piriformis pain.  His left side is worse than the right.  He is status post left SI joint and left piriformis injection on 11/12/2022 that provided him with over 75% pain relief for over 6 months.  Given return of his pain, we discussed repeating injections however performing them on both sides since he is also having pain in his right SI joint and right piriformis region.  He is also complaining of increased left knee pain related to left knee osteoarthritis.  He is status post left knee genicular nerve radiofrequency ablation on 08/20/2022 that provided him with 70% pain relief for over 6 months.  Over the last month, he has noticed increased pain with weightbearing he would like to repeat left genicular nerve RFA.   ROS  Constitutional: Denies any fever or chills Gastrointestinal: No reported hemesis, hematochezia, vomiting, or acute GI distress Musculoskeletal:  Left knee pain Neurological: No reported episodes of acute onset apraxia, aphasia, dysarthria, agnosia, amnesia, paralysis, loss of coordination, or loss of consciousness  Medication Review  Black Pepper-Turmeric, Collagen, Grape Seed, Omega-3, Quercetin, Vitamin C, Vitamin D3, cyanocobalamin, losartan, nitroGLYCERIN, and zinc sulfate  History Review  Allergy: Mr. Kishi has No Known Allergies. Drug: Mr. Zachar  reports no history of drug use. Alcohol:  reports current alcohol use of about 4.0 standard drinks of alcohol per week. Tobacco:  reports that he has never smoked. He has never been exposed to tobacco smoke. He has never used smokeless tobacco. Social: Mr. Hovan  reports that he has never smoked. He has never been exposed to tobacco smoke. He has never used smokeless tobacco. He reports current alcohol use of about  4.0 standard drinks of alcohol per week. He reports that he does not use drugs. Medical:  has a past medical history of Anginal pain (HCC), Cancer (HCC), Coronary artery disease,  COVID-19, Edema, Hypertension, Myocardial infarction (HCC), Obesity, and Varicose veins of both lower extremities. Surgical: Mr. Kneisley  has a past surgical history that includes Shoulder acromioplasty; Colonoscopy with propofol (N/A, 12/12/2015); Total hip arthroplasty (Bilateral); Knee surgery; Cardiac catheterization (2015); Cataract extraction w/PHACO (Right, 11/30/2020); and laparotomy (N/A, 11/21/2022). Family: family history includes Heart disease in his father; Obesity in his brother and brother.  Laboratory Chemistry Profile   Renal Lab Results  Component Value Date   BUN 14 12/04/2022   CREATININE 1.00 12/04/2022   GFRAA >60 02/21/2020   GFRNONAA >60 12/04/2022    Hepatic Lab Results  Component Value Date   AST 24 11/23/2022   ALT 30 11/23/2022   ALBUMIN 2.9 (L) 11/23/2022   ALKPHOS 53 11/23/2022   LIPASE 42 11/21/2022    Electrolytes Lab Results  Component Value Date   NA 142 12/04/2022   K 4.2 12/04/2022   CL 107 12/04/2022   CALCIUM 8.7 (L) 12/04/2022   MG 2.3 11/28/2022   PHOS 3.0 11/25/2022    Bone No results found for: "VD25OH", "VD125OH2TOT", "FU9323FT7", "DU2025KY7", "25OHVITD1", "25OHVITD2", "25OHVITD3", "TESTOFREE", "TESTOSTERONE"  Inflammation (CRP: Acute Phase) (ESR: Chronic Phase) No results found for: "CRP", "ESRSEDRATE", "LATICACIDVEN"       Note: Above Lab results reviewed.  Recent Imaging Review  CT ABDOMEN PELVIS W CONTRAST CLINICAL DATA:  Abdominal pain. Status post duodenal perforation repair on 11/21/2022. Left upper quadrant drain now seropurulent in discharge.  EXAM: CT ABDOMEN AND PELVIS WITH CONTRAST  TECHNIQUE: Multidetector CT imaging of the abdomen and pelvis was performed using the standard protocol following bolus administration of intravenous contrast.  RADIATION DOSE REDUCTION: This exam was performed according to the departmental dose-optimization program which includes automated exposure control, adjustment of the mA  and/or kV according to patient size and/or use of iterative reconstruction technique.  CONTRAST:  OMNIPAQUE IOHEXOL 300 MG/ML  SOLN  COMPARISON:  11/21/2022  FINDINGS: Lower chest: Atelectasis noted in the dependent lung bases.  Hepatobiliary: No suspicious focal abnormality within the liver parenchyma. Gallbladder is distended with layering tiny calcified gallstones evident. No intrahepatic or extrahepatic biliary dilation.  Pancreas: No focal mass lesion. No dilatation of the main duct. No intraparenchymal cyst. No peripancreatic edema.  Spleen: No splenomegaly. No focal mass lesion.  Adrenals/Urinary Tract: No adrenal nodule or mass. 13 mm subcapsular lesion upper pole left kidney posteriorly has attenuation too high to be a simple cyst. Hance mint characteristics cannot be determined on this postcontrast study. No evidence for hydroureter. Bladder partially obscured by beam hardening artifact from bilateral hip replacement.  Stomach/Bowel: Stomach is unremarkable. No gastric wall thickening. No evidence of outlet obstruction. Retroperitoneal gas seen on the previous exam has largely resolved in the interval. There is a thin walled 17 x 11 mm collection of gas and contrast posterior to the pancreatic head. This is in a typical location for duodenal diverticulum but contained extraluminal collection of gas and contrast (leak) could also have this appearance. Additional tiny gas collection seen posterior to the duodenum on 33/2, potentially residual from previous surgery although trace gas from recurrent leak not excluded. The patient's left-sided surgical drain tracks along the superior wall of the distal stomach and then along the descending duodenum with the tip just anterior to the right kidney. There is a small unorganized  fluid collection between the duodenum and the surgical drain measuring about 14 x 15 mm on image 36/2. No perceptible contrast material within  the left upper quadrant surgical drain.  No small bowel wall thickening. No small bowel dilatation. Left-sided J tube evident. The terminal ileum is normal. The appendix is normal. No gross colonic mass. No colonic wall thickening. Diverticuli are seen scattered along the entire length of the colon without CT findings of diverticulitis.  Vascular/Lymphatic: There is mild atherosclerotic calcification of the abdominal aorta without aneurysm. There is no gastrohepatic or hepatoduodenal ligament lymphadenopathy. No retroperitoneal or mesenteric lymphadenopathy. No pelvic sidewall lymphadenopathy.  Reproductive: Prostate gland largely obscured by beam hardening artifact.  Other: There is some trace free fluid in the right upper quadrant along the ascending colon. Surgical drain on the right tracks up along the ascending colon into the gallbladder fossa with the tip positioned inferior to the caudate lobe along the cranial margin of the distal stomach.  Musculoskeletal: Bilateral hip replacement.  IMPRESSION: 1. Retroperitoneal gas seen on the previous exam has largely resolved in the interval. There is a thin walled 17 x 11 mm collection of gas and contrast material just posterior to the head and body of the pancreas. While this is in a typical location for duodenal diverticulum, contained extraluminal collection of gas and contrast (leak) could also have this appearance. Additional tiny gas collection seen posterior to the duodenum, potentially residual from previous surgery although trace gas from recurrent leak not excluded. 2. The patient's left-sided surgical drain tracks along the superior wall of the distal stomach and then along the descending duodenum with the tip just anterior to the right kidney. There is a small unorganized fluid collection between the duodenum and the surgical drain measuring about 14 x 15 mm. There is no contrast material in this collection. No  perceptible contrast material within the left upper quadrant surgical drain. 3. 13 mm subcapsular lesion upper pole left kidney posteriorly has attenuation too high to be a simple cyst. Enhancement characteristics cannot be determined on this postcontrast study. While this may represent a cyst complicated by proteinaceous debris or hemorrhage, MRI of the abdomen with and without contrast recommended to further evaluate. MRI should be deferred until the patient has recovered from the acute illness and can best participate with positioning and reproducible breath holding. 4. Cholelithiasis. 5. Colonic diverticulosis without diverticulitis. 6.  Aortic Atherosclerosis (ICD10-I70.0).  I discussed these findings with Dr. Aleen Campi at the time of study interpretation.  Electronically Signed   By: Kennith Center M.D.   On: 12/01/2022 12:20 Note: Reviewed        Physical Exam  General appearance: Well nourished, well developed, and well hydrated. In no apparent acute distress Mental status: Alert, oriented x 3 (person, place, & time)       Respiratory: No evidence of acute respiratory distress Eyes: PERLA Vitals: BP (!) 178/90   Pulse 61   Temp 97.6 F (36.4 C)   Resp 18   Ht 6\' 1"  (1.854 m)   Wt 227 lb (103 kg)   SpO2 100%   BMI 29.95 kg/m  BMI: Estimated body mass index is 29.95 kg/m as calculated from the following:   Height as of this encounter: 6\' 1"  (1.854 m).   Weight as of this encounter: 227 lb (103 kg). Ideal: Ideal body weight: 79.9 kg (176 lb 2.4 oz) Adjusted ideal body weight: 89.1 kg (196 lb 7.8 oz)   Lumbar Spine Area Exam  Skin &  Axial Inspection: No masses, redness, or swelling Alignment: Symmetrical Functional ROM: Unrestricted ROM       Stability: No instability detected Muscle Tone/Strength: Functionally intact. No obvious neuro-muscular anomalies detected. Sensory (Neurological): Musculoskeletal pain pattern Palpation: No palpable anomalies        Provocative Tests: Hyperextension/rotation test: deferred today       Lumbar quadrant test (Kemp's test): deferred today       Lateral bending test: deferred today       Patrick's Maneuver: (+) for right-sided> left S-I arthralgia             FABER* test: (+) for right-sided> left S-I arthralgia                S-I anterior distraction/compression test:+) for right-sided> left S-I arthralgia         S-I lateral compression test: +) for right-sided> left S-I arthralgia         S-I Thigh-thrust test: (+) for right-sided> left S-I arthralgia            S-I Gaenslen's test: +) for right-sided> left S-I arthralgia              *(Flexion, ABduction and External Rotation) Gait & Posture Assessment  Ambulation: Unassisted Gait: Relatively normal for age and body habitus Posture: WNL  Lower Extremity Exam      Side: Right lower extremity   Side: Left lower extremity  Stability: No instability observed           Stability: No instability observed          Skin & Extremity Inspection: Skin color, temperature, and hair growth are WNL. No peripheral edema or cyanosis. No masses, redness, swelling, asymmetry, or associated skin lesions. No contractures.   Skin & Extremity Inspection: Skin color, temperature, and hair growth are WNL. No peripheral edema or cyanosis. No masses, redness, swelling, asymmetry, or associated skin lesions. No contractures.  Functional ROM: Unrestricted ROM                   Functional ROM: Pain restricted ROM for knee joint          Muscle Tone/Strength: Functionally intact. No obvious neuro-muscular anomalies detected.   Muscle Tone/Strength: Functionally intact. No obvious neuro-muscular anomalies detected.  Sensory (Neurological): Unimpaired         Sensory (Neurological): Arthropathic arthralgia        DTR: Patellar: deferred today Achilles: deferred today Plantar: deferred today   DTR: Patellar: deferred today Achilles: deferred today Plantar: deferred today   Palpation: No palpable anomalies   Palpation: No palpable anomalies       Assessment   Diagnosis Status  1. Sacroiliac joint pain   2. Piriformis syndrome, right   3. Piriformis syndrome, left   4. Chronic pain syndrome   5. Sacroiliac joint dysfunction of left side   6. Primary osteoarthritis of left knee    Having a Flare-up Having a Flare-up Having a Flare-up    Plan of Care  1. Sacroiliac joint pain - SACROILIAC JOINT INJECTION; Future - TRIGGER POINT INJECTION  2. Piriformis syndrome, right - TRIGGER POINT INJECTION  3. Piriformis syndrome, left - TRIGGER POINT INJECTION  4. Chronic pain syndrome - SACROILIAC JOINT INJECTION; Future - TRIGGER POINT INJECTION - Radiofrequency,Genicular; Future  5. Sacroiliac joint dysfunction of left side - SACROILIAC JOINT INJECTION; Future  6. Primary osteoarthritis of left knee - Radiofrequency,Genicular; Future    Orders:  Orders Placed This Encounter  Procedures  SACROILIAC JOINT INJECTION    Standing Status:   Future    Standing Expiration Date:   10/01/2023    Scheduling Instructions:     Side: Bilateral     Sedation: without     Timeframe: ASAP    Order Specific Question:   Where will this procedure be performed?    Answer:   [facility]   TRIGGER POINT INJECTION    Area: Buttocks region (gluteal area) Indications: Piriformis muscle pain; Bilateral (G57.03) piriformis-syndrome; piriformis muscle spasms (Y86.578). CPT code: 46962    Scheduling Instructions:     Type: Myoneural block (TPI) of piriformis muscle.     Side:  B/L     Sedation: Patient's choice.     Timeframe: Today    Order Specific Question:   Where will this procedure be performed?    Answer:   [facility]   Radiofrequency,Genicular    Standing Status:   Future    Standing Expiration Date:   10/01/2023    Scheduling Instructions:     Side(s): LEFT     Level(s): Superior-Lateral, Superior-Medial, and Inferior-Medial  Genicular Nerve(s)     Sedation: without     Scheduling Timeframe: As soon as pre-approved    Order Specific Question:   Where will this procedure be performed?    Answer:   [facility]   Follow-up plan:   Return in about 8 days (around 07/10/2023) for B/L SI-J and Piriformis (sept 11 at 11 am).      Status post left L5-S1 ESI #1 on 05/06/2019, and #2 on 06/17/2019 as well as left sacroiliac joint injection #1 on 06/17/2019: Right SI joint injection, right piriformis injection on 04/20/2020-helped significantly; LEFT SI joint injection, LEFT piriformis injection on 05/30/20, 11/12/22       Recent Visits Date Type Provider Dept  04/04/23 Office Visit Edward Jolly, MD [facility]  Showing recent visits within past 90 days and meeting all other requirements Today's Visits Date Type Provider Dept  07/02/23 Office Visit Edward Jolly, MD [facility]  Showing today's visits and meeting all other requirements Future Appointments Date Type Provider Dept  07/10/23 Appointment Edward Jolly, MD [facility]  07/24/23 Appointment Edward Jolly, MD [facility]  Showing future appointments within next 90 days and meeting all other requirements  I discussed the assessment and treatment plan with the patient. The patient was provided an opportunity to ask questions and all were answered. The patient agreed with the plan and demonstrated an understanding of the instructions.  Patient advised to call back or seek an in-person evaluation if the symptoms or condition worsens.  Duration of encounter: .  Total time on encounter, as per AMA guidelines included both the face-to-face and non-face-to-face time personally spent by the physician and/or other qualified health care professional(s) on the day of the encounter (includes time in activities that require the physician or other qualified health care professional and does not include time in activities  normally performed by clinical staff). Physician's time may include the following activities when performed: Preparing to see the patient (e.g., pre-charting review of records, searching for previously ordered imaging, lab work, and nerve conduction tests) Review of prior analgesic pharmacotherapies. Reviewing PMP Interpreting ordered tests (e.g., lab work, imaging, nerve conduction tests) Performing post-procedure evaluations, including interpretation of diagnostic procedures Obtaining and/or reviewing separately obtained history Performing a medically appropriate examination and/or evaluation Counseling and educating the patient/family/caregiver Ordering medications, tests, or procedures Referring and  communicating with other health care professionals (when not separately reported) Documenting clinical information in the electronic or other health record Independently interpreting results (not separately reported) and communicating results to the patient/ family/caregiver Care coordination (not separately reported)  Note by: Edward Jolly, MD Date: 07/02/2023; Time: 1:37 PM

## 2023-07-02 NOTE — Progress Notes (Signed)
Safety precautions to be maintained throughout the outpatient stay will include: orient to surroundings, keep bed in low position, maintain call bell within reach at all times, provide assistance with transfer out of bed and ambulation.  

## 2023-07-04 DIAGNOSIS — R739 Hyperglycemia, unspecified: Secondary | ICD-10-CM | POA: Diagnosis not present

## 2023-07-04 DIAGNOSIS — Z0000 Encounter for general adult medical examination without abnormal findings: Secondary | ICD-10-CM | POA: Diagnosis not present

## 2023-07-04 DIAGNOSIS — E538 Deficiency of other specified B group vitamins: Secondary | ICD-10-CM | POA: Diagnosis not present

## 2023-07-04 DIAGNOSIS — Z23 Encounter for immunization: Secondary | ICD-10-CM | POA: Diagnosis not present

## 2023-07-04 DIAGNOSIS — Z125 Encounter for screening for malignant neoplasm of prostate: Secondary | ICD-10-CM | POA: Diagnosis not present

## 2023-07-04 DIAGNOSIS — E782 Mixed hyperlipidemia: Secondary | ICD-10-CM | POA: Diagnosis not present

## 2023-07-04 DIAGNOSIS — M1712 Unilateral primary osteoarthritis, left knee: Secondary | ICD-10-CM | POA: Diagnosis not present

## 2023-07-04 DIAGNOSIS — D369 Benign neoplasm, unspecified site: Secondary | ICD-10-CM | POA: Diagnosis not present

## 2023-07-05 DIAGNOSIS — E782 Mixed hyperlipidemia: Secondary | ICD-10-CM | POA: Diagnosis not present

## 2023-07-05 DIAGNOSIS — D369 Benign neoplasm, unspecified site: Secondary | ICD-10-CM | POA: Diagnosis not present

## 2023-07-05 DIAGNOSIS — Z23 Encounter for immunization: Secondary | ICD-10-CM | POA: Diagnosis not present

## 2023-07-05 DIAGNOSIS — Z125 Encounter for screening for malignant neoplasm of prostate: Secondary | ICD-10-CM | POA: Diagnosis not present

## 2023-07-05 DIAGNOSIS — R739 Hyperglycemia, unspecified: Secondary | ICD-10-CM | POA: Diagnosis not present

## 2023-07-05 DIAGNOSIS — Z0000 Encounter for general adult medical examination without abnormal findings: Secondary | ICD-10-CM | POA: Diagnosis not present

## 2023-07-05 DIAGNOSIS — E538 Deficiency of other specified B group vitamins: Secondary | ICD-10-CM | POA: Diagnosis not present

## 2023-07-05 DIAGNOSIS — M1712 Unilateral primary osteoarthritis, left knee: Secondary | ICD-10-CM | POA: Diagnosis not present

## 2023-07-10 ENCOUNTER — Encounter: Payer: Self-pay | Admitting: Dermatology

## 2023-07-10 ENCOUNTER — Encounter: Payer: Self-pay | Admitting: Student in an Organized Health Care Education/Training Program

## 2023-07-10 ENCOUNTER — Ambulatory Visit: Payer: PPO | Admitting: Dermatology

## 2023-07-10 ENCOUNTER — Ambulatory Visit
Payer: PPO | Attending: Student in an Organized Health Care Education/Training Program | Admitting: Student in an Organized Health Care Education/Training Program

## 2023-07-10 ENCOUNTER — Ambulatory Visit
Admission: RE | Admit: 2023-07-10 | Discharge: 2023-07-10 | Disposition: A | Discharge status: Home / Self Care | Payer: PPO | Source: Ambulatory Visit | Attending: Student in an Organized Health Care Education/Training Program | Admitting: Student in an Organized Health Care Education/Training Program

## 2023-07-10 VITALS — BP 162/90 | HR 54 | Temp 97.3°F | Resp 17 | Ht 73.0 in | Wt 220.0 lb

## 2023-07-10 DIAGNOSIS — Z85828 Personal history of other malignant neoplasm of skin: Secondary | ICD-10-CM

## 2023-07-10 DIAGNOSIS — M1712 Unilateral primary osteoarthritis, left knee: Secondary | ICD-10-CM

## 2023-07-10 DIAGNOSIS — L814 Other melanin hyperpigmentation: Secondary | ICD-10-CM | POA: Diagnosis not present

## 2023-07-10 DIAGNOSIS — W908XXA Exposure to other nonionizing radiation, initial encounter: Secondary | ICD-10-CM

## 2023-07-10 DIAGNOSIS — D034 Melanoma in situ of scalp and neck: Secondary | ICD-10-CM

## 2023-07-10 DIAGNOSIS — D039 Melanoma in situ, unspecified: Secondary | ICD-10-CM

## 2023-07-10 DIAGNOSIS — M720 Palmar fascial fibromatosis [Dupuytren]: Secondary | ICD-10-CM

## 2023-07-10 DIAGNOSIS — Z1283 Encounter for screening for malignant neoplasm of skin: Secondary | ICD-10-CM

## 2023-07-10 DIAGNOSIS — L570 Actinic keratosis: Secondary | ICD-10-CM

## 2023-07-10 DIAGNOSIS — D485 Neoplasm of uncertain behavior of skin: Secondary | ICD-10-CM

## 2023-07-10 DIAGNOSIS — L578 Other skin changes due to chronic exposure to nonionizing radiation: Secondary | ICD-10-CM

## 2023-07-10 DIAGNOSIS — D229 Melanocytic nevi, unspecified: Secondary | ICD-10-CM

## 2023-07-10 DIAGNOSIS — D1801 Hemangioma of skin and subcutaneous tissue: Secondary | ICD-10-CM

## 2023-07-10 DIAGNOSIS — L820 Inflamed seborrheic keratosis: Secondary | ICD-10-CM

## 2023-07-10 DIAGNOSIS — L821 Other seborrheic keratosis: Secondary | ICD-10-CM

## 2023-07-10 HISTORY — DX: Melanoma in situ, unspecified: D03.9

## 2023-07-10 MED ORDER — METHYLPREDNISOLONE ACETATE 80 MG/ML IJ SUSP
80.0000 mg | Freq: Once | INTRAMUSCULAR | Status: AC
  Administered 2023-07-10: 80 mg via INTRA_ARTICULAR
  Filled 2023-07-10: qty 1

## 2023-07-10 MED ORDER — ROPIVACAINE HCL 2 MG/ML IJ SOLN
9.0000 mL | Freq: Once | INTRAMUSCULAR | Status: AC
  Administered 2023-07-10: 9 mL via PERINEURAL
  Filled 2023-07-10: qty 20

## 2023-07-10 MED ORDER — LIDOCAINE HCL 2 % IJ SOLN
20.0000 mL | Freq: Once | INTRAMUSCULAR | Status: AC
  Administered 2023-07-10: 400 mg
  Filled 2023-07-10: qty 40

## 2023-07-10 NOTE — Patient Instructions (Addendum)
Cryotherapy Aftercare  Wash gently with soap and water everyday.   Apply Vaseline and Band-Aid daily until healed.    Patient Handout: Wound Care for Skin Biopsy Site  Taking Care of Your Skin Biopsy Site  Proper care of the biopsy site is essential for promoting healing and minimizing scarring. This handout provides instructions on how to care for your biopsy site to ensure optimal recovery.  1. Cleaning the Wound:  Clean the biopsy site daily with gentle soap and water. Gently pat the area dry with a clean, soft towel. Avoid harsh scrubbing or rubbing the area, as this can irritate the skin and delay healing.  2. Applying Aquaphor and Bandage:  After cleaning the wound, apply a thin layer of Vaseline ointment to the biopsy site. Cover the area with a sterile bandage to protect it from dirt, bacteria, and friction. Change the bandage daily or as needed if it becomes soiled or wet.  3. Continued Care for One Week:  Repeat the cleaning, Vaseline application, and bandaging process daily for one week following the biopsy procedure. Keeping the wound clean and moist during this initial healing period will help prevent infection and promote optimal healing.  4. Massaging Vaseline into the Area:  ---After one week, discontinue the use of bandages but continue to apply Vaseline to the biopsy site. ----Gently massage the Vaseline into the area using circular motions. ---Massaging the skin helps to promote circulation and prevent the formation of scar tissue.   Additional Tips:  Avoid exposing the biopsy site to direct sunlight during the healing process, as this can cause hyperpigmentation or worsen scarring. If you experience any signs of infection, such as increased redness, swelling, warmth, or drainage from the wound, contact your healthcare provider immediately. Follow any additional instructions provided by your healthcare provider for caring for the biopsy site and managing any  discomfort. Conclusion:  Taking proper care of your skin biopsy site is crucial for ensuring optimal healing and minimizing scarring. By following these instructions for cleaning, applying Aquaphor, and massaging the area, you can promote a smooth and successful recovery. If you have any questions or concerns about caring for your biopsy site, don't hesitate to contact your healthcare provider for guidance.   Melanoma ABCDEs  Melanoma is the most dangerous type of skin cancer, and is the leading cause of death from skin disease.  You are more likely to develop melanoma if you: Have light-colored skin, light-colored eyes, or red or blond hair Spend a lot of time in the sun Tan regularly, either outdoors or in a tanning bed Have had blistering sunburns, especially during childhood Have a close family member who has had a melanoma Have atypical moles or large birthmarks  Early detection of melanoma is key since treatment is typically straightforward and cure rates are extremely high if we catch it early.   The first sign of melanoma is often a change in a mole or a new dark spot.  The ABCDE system is a way of remembering the signs of melanoma.  A for asymmetry:  The two halves do not match. B for border:  The edges of the growth are irregular. C for color:  A mixture of colors are present instead of an even brown color. D for diameter:  Melanomas are usually (but not always) greater than 6mm - the size of a pencil eraser. E for evolution:  The spot keeps changing in size, shape, and color.  Please check your skin once per month  between visits. You can use a small mirror in front and a large mirror behind you to keep an eye on the back side or your body.   If you see any new or changing lesions before your next follow-up, please call to schedule a visit.  Please continue daily skin protection including broad spectrum sunscreen SPF 30+ to sun-exposed areas, reapplying every 2 hours as needed  when you're outdoors.    Due to recent changes in healthcare laws, you may see results of your pathology and/or laboratory studies on MyChart before the doctors have had a chance to review them. We understand that in some cases there may be results that are confusing or concerning to you. Please understand that not all results are received at the same time and often the doctors may need to interpret multiple results in order to provide you with the best plan of care or course of treatment. Therefore, we ask that you please give [place] 2 business days to thoroughly review all your results before contacting the office for clarification. Should we see a critical lab result, you will be contacted sooner.   If You Need Anything After Your Visit  If you have any questions or concerns for your doctor, please call our main line at [phone] and press option 4 to reach your doctor's medical assistant. If no one answers, please leave a voicemail as directed and we will return your call as soon as possible. Messages left after 4 pm will be answered the following business day.   You may also send [place] a message via MyChart. We typically respond to MyChart messages within 1-2 business days.  For prescription refills, please ask your pharmacy to contact our office. Our fax number is [phone].  If you have an urgent issue when the clinic is closed that cannot wait until the next business day, you can page your doctor at the number below.    Please note that while we do our best to be available for urgent issues outside of office hours, we are not available 24/7.   If you have an urgent issue and are unable to reach [place], you may choose to seek medical care at your doctor's office, retail clinic, urgent care center, or emergency room.  If you have a medical emergency, please immediately call 911 or go to the emergency department.  Pager Numbers  - Dr. Gwen Pounds: [phone]  - Dr. Roseanne Reno: [phone]  -  Dr. Katrinka Blazing: [phone]   In the event of inclement weather, please call our main line at [phone] for an update on the status of any delays or closures.  Dermatology Medication Tips: Please keep the boxes that topical medications come in in order to help keep track of the instructions about where and how to use these. Pharmacies typically print the medication instructions only on the boxes and not directly on the medication tubes.   If your medication is too expensive, please contact our office at [phone] option 4 or send [place] a message through MyChart.   We are unable to tell what your co-pay for medications will be in advance as this is different depending on your insurance coverage. However, we may be able to find a substitute medication at lower cost or fill out paperwork to get insurance to cover a needed medication.   If a prior authorization is required to get your medication covered by your insurance company, please allow [place] 1-2 business days to complete this process.  Drug prices often  vary depending on where the prescription is filled and some pharmacies may offer cheaper prices.  The website www.goodrx.com contains coupons for medications through different pharmacies. The prices here do not account for what the cost may be with help from insurance (it may be cheaper with your insurance), but the website can give you the price if you did not use any insurance.  - You can print the associated coupon and take it with your prescription to the pharmacy.  - You may also stop by our office during regular business hours and pick up a GoodRx coupon card.  - If you need your prescription sent electronically to a different pharmacy, notify our office through [facility] or by phone at [phone] option 4.

## 2023-07-10 NOTE — Progress Notes (Signed)
Patient's Name: Ricardo Winters.  MRN: [id number]  Referring Provider: Cydney Ok*  DOB: 27-Sep-1946  PCP: Bary Leriche, MD  DOS: 07/10/2023  Note by: Edward Jolly, MD  Service setting: Ambulatory outpatient  Specialty: Interventional Pain Management  Patient type: Established  Location: [facility] (AMB) Pain Management Facility  Visit type: Interventional Procedure   Primary Reason for Visit: Interventional Pain Management Treatment. CC: Knee Pain (left)  Procedure:          Anesthesia, Analgesia, Anxiolysis:  Type: Genicular Nerves Block (Superior-lateral, Superior-medial, and Inferior-medial Genicular Nerves)  CPT: 64450      Primary Purpose: Palliative Region: Lateral, Anterior, and Medial aspects of the knee joint, above and below the knee joint proper. Level: Superior and inferior to the knee joint. Target Area: For Genicular Nerve block(s), the targets are: the superior-lateral genicular nerve, located in the lateral distal portion of the femoral shaft as it curves to form the lateral epicondyle, in the region of the distal femoral metaphysis; the superior-medial genicular nerve, located in the medial distal portion of the femoral shaft as it curves to form the medial epicondyle; and the inferior-medial genicular nerve, located in the medial, proximal portion of the tibial shaft, as it curves to form the medial epicondyle, in the region of the proximal tibial metaphysis. Approach: Anterior, percutaneous, ipsilateral approach. Laterality: Left knee Position: Modified Fowler's position with pillows under the targeted knee(s).  Type: Local Anesthesia Indication(s): Analgesia         Route: Infiltration ([place]/IM) IV Access: Declined Sedation: Declined  Local Anesthetic: Lidocaine 1-2%   Indications: 1. Primary osteoarthritis of left knee    Pain Score: Pre-procedure: 4 /10 Post-procedure: 0-No pain/10  Pre-op Assessment:  Mr. Coones is a 77 y.o. (year old),  male patient, seen today for interventional treatment. He  has a past surgical history that includes Shoulder acromioplasty; Colonoscopy with propofol (N/A, 12/12/2015); Total hip arthroplasty (Bilateral); Knee surgery; Cardiac catheterization (2015); Cataract extraction w/PHACO (Right, 11/30/2020); and laparotomy (N/A, 11/21/2022). [name] has a current medication list which includes the following prescription(s): vitamin c, black pepper-turmeric, vitamin d3, collagen, grape seed, losartan, nitroglycerin, omega-3, quercetin, cyanocobalamin, and zinc sulfate. His primarily concern today is the Knee Pain (left)  Initial Vital Signs:  Pulse/HCG Rate: (!) 54ECG Heart Rate: (!) 56 Temp: (!) 97.3 F (36.3 C) Resp: 13 BP: (!) 183/94 SpO2: 100 %  BMI: Estimated body mass index is 29.03 kg/m as calculated from the following:   Height as of this encounter: 6\' 1"  (1.854 m).   Weight as of this encounter: 220 lb (99.8 kg).  Risk Assessment: Allergies: Reviewed. He has No Known Allergies.  Allergy Precautions: None required Coagulopathies: Reviewed. None identified.  Blood-thinner therapy: None at this time Active Infection(s): Reviewed. None identified. Mr. Dural is afebrile  Site Confirmation: Mr. Mustapha was asked to confirm the procedure and laterality before marking the site Procedure checklist: Completed Consent: Before the procedure and under the influence of no sedative(s), amnesic(s), or anxiolytics, the patient was informed of the treatment options, risks and possible complications. To fulfill our ethical and legal obligations, as recommended by the American Medical Association's Code of Ethics, I have informed the patient of my clinical impression; the nature and purpose of the treatment or procedure; the risks, benefits, and possible complications of the intervention; the alternatives, including doing nothing; the risk(s) and benefit(s) of the alternative treatment(s) or procedure(s); and the  risk(s) and benefit(s) of doing nothing. The patient was provided information  about the general risks and possible complications associated with the procedure. These may include, but are not limited to: failure to achieve desired goals, infection, bleeding, organ or nerve damage, allergic reactions, paralysis, and death. In addition, the patient was informed of those risks and complications associated to the procedure, such as failure to decrease pain; infection; bleeding; organ or nerve damage with subsequent damage to sensory, motor, and/or autonomic systems, resulting in permanent pain, numbness, and/or weakness of one or several areas of the body; allergic reactions; (i.e.: anaphylactic reaction); and/or death. Furthermore, the patient was informed of those risks and complications associated with the medications. These include, but are not limited to: allergic reactions (i.e.: anaphylactic or anaphylactoid reaction(s)); adrenal axis suppression; blood sugar elevation that in diabetics may result in ketoacidosis or comma; water retention that in patients with history of congestive heart failure may result in shortness of breath, pulmonary edema, and decompensation with resultant heart failure; weight gain; swelling or edema; medication-induced neural toxicity; particulate matter embolism and blood vessel occlusion with resultant organ, and/or nervous system infarction; and/or aseptic necrosis of one or more joints. Finally, the patient was informed that Medicine is not an exact science; therefore, there is also the possibility of unforeseen or unpredictable risks and/or possible complications that may result in a catastrophic outcome. The patient indicated having understood very clearly. We have given the patient no guarantees and we have made no promises. Enough time was given to the patient to ask questions, all of which were answered to the patient's satisfaction. Mr. Mimnaugh has indicated that he wanted to  continue with the procedure. Attestation: I, the ordering provider, attest that I have discussed with the patient the benefits, risks, side-effects, alternatives, likelihood of achieving goals, and potential problems during recovery for the procedure that I have provided informed consent. Date  Time: 07/10/2023 11:01 AM  Pre-Procedure Preparation:  Monitoring: As per clinic protocol. Respiration, ETCO2, SpO2, BP, heart rate and rhythm monitor placed and checked for adequate function Safety Precautions: Patient was assessed for positional comfort and pressure points before starting the procedure. Time-out: I initiated and conducted the "Time-out" before starting the procedure, as per protocol. The patient was asked to participate by confirming the accuracy of the "Time Out" information. Verification of the correct person, site, and procedure were performed and confirmed by me, the nursing staff, and the patient. "Time-out" conducted as per Joint Commission's Universal Protocol (UP.01.01.01). Time: 1116  Description of Procedure:          Area Prepped: Entire knee area, from mid-thigh to mid-shin, lateral, anterior, and medial aspects. Prepping solution: ChloraPrep (2% chlorhexidine gluconate and 70% isopropyl alcohol) Safety Precautions: Aspiration looking for blood return was conducted prior to all injections. At no point did we inject any substances, as a needle was being advanced. No attempts were made at seeking any paresthesias. Safe injection practices and needle disposal techniques used. Medications properly checked for expiration dates. SDV (single dose vial) medications used. Latex Allergy precautions taken.   Description of the Procedure: Protocol guidelines were followed. The patient was placed in position over the procedure table. The target area was identified and the area prepped in the usual manner. Skin & deeper tissues infiltrated with local anesthetic. Appropriate amount of time  allowed to pass for local anesthetics to take effect. The procedure needles were then advanced to the target area. Proper needle placement secured. Negative aspiration confirmed. Solution injected in intermittent fashion, asking for systemic symptoms every 0.5cc of injectate. The needles  were then removed and the area cleansed, making sure to leave some of the prepping solution back to take advantage of its long term bactericidal properties.  Vitals:   07/10/23 1105 07/10/23 1115 07/10/23 1120 07/10/23 1123  BP: (!) 183/94 (!) 185/86 (!) 187/95 (!) 162/90  Pulse: (!) 54     Resp:  13 19 17   Temp: (!) 97.3 F (36.3 C)     SpO2: 100% 100% 100% 100%  Weight: 220 lb (99.8 kg)     Height: 6\' 1"  (1.854 m)       Start Time: 1117 hrs. End Time: 1121 hrs. Materials:  Needle(s) Type: Regular needle Gauge: 22G Length: 3.5-in Medication(s): Please see orders for medications and dosing details. 6 cc solution containing 5 cc of 0.2% ropivacaine, 1 cc of methylprednisolone 80 mg/cc.   2 cc injected at each genicular nerve above. Imaging Guidance (Non-Spinal):          Type of Imaging Technique: Fluoroscopy Guidance (Non-Spinal) Indication(s): Assistance in needle guidance and placement for procedures requiring needle placement in or near specific anatomical locations not easily accessible without such assistance. Exposure Time: Please see nurses notes. Contrast: Before injecting any contrast, we confirmed that the patient did not have an allergy to iodine, shellfish, or radiological contrast. Once satisfactory needle placement was completed at the desired level, radiological contrast was injected. Contrast injected under live fluoroscopy. No contrast complications. See chart for type and volume of contrast used. Fluoroscopic Guidance: I was personally present during the use of fluoroscopy. "Tunnel Vision Technique" used to obtain the best possible view of the target area. Parallax error corrected before  commencing the procedure. "Direction-depth-direction" technique used to introduce the needle under continuous pulsed fluoroscopy. Once target was reached, antero-posterior, oblique, and lateral fluoroscopic projection used confirm needle placement in all planes. Images permanently stored in EMR. Interpretation: I personally interpreted the imaging intraoperatively. Adequate needle placement confirmed in multiple planes. Appropriate spread of contrast into desired area was observed. No evidence of afferent or efferent intravascular uptake. Permanent images saved into the patient's record.  Antibiotic Prophylaxis:   Anti-infectives (From admission, onward)    None      Indication(s): None identified  Post-operative Assessment:  Post-procedure Vital Signs:  Pulse/HCG Rate: (!) 5460 Temp: (!) 97.3 F (36.3 C) Resp: 17 BP: (!) 162/90 SpO2: 100 %  EBL: None  Complications: No immediate post-treatment complications observed by team, or reported by patient.  Note: The patient tolerated the entire procedure well. A repeat set of vitals were taken after the procedure and the patient was kept under observation following institutional policy, for this type of procedure. Post-procedural neurological assessment was performed, showing return to baseline, prior to discharge. The patient was provided with post-procedure discharge instructions, including a section on how to identify potential problems. Should any problems arise concerning this procedure, the patient was given instructions to immediately contact us, at any time, without hesitation. In any case, we plan to contact the patient by telephone for a follow-up status report regarding this interventional procedure.  Comments:  No additional relevant information.  Plan of Care   Imaging Orders         DG PAIN CLINIC C-ARM 1-60 MIN NO REPORT      Medications ordered for procedure: Meds ordered this encounter  Medications   lidocaine  (XYLOCAINE) 2 % (with pres) injection 400 mg   methylPREDNISolone acetate (DEPO-MEDROL) injection 80 mg   ropivacaine (PF) 2 mg/mL (0.2%) (NAROPIN) injection 9 mL  Medications administered: We administered lidocaine, methylPREDNISolone acetate, and ropivacaine (PF) 2 mg/mL (0.2%).  See the medical record for exact dosing, route, and time of administration.  New Prescriptions   No medications on file   Disposition: Discharge home  Discharge Date & Time: 07/10/2023; 1124 hrs.   Physician-requested Follow-up: Return for Keep sch. appt .  Future Appointments  Date Time Provider Department Center  07/10/2023  2:00 PM Elie Goody, MD [facility] None  07/29/2023  9:20 AM Edward Jolly, MD [facility] None   Primary Care Physician: Bary Leriche, MD Location: [facility] Out[facility] Management [facility] Note by: Edward Jolly, MD Date: 07/10/2023; Time: 1:04 PM  Disclaimer:  Medicine is not an exact science. The only guarantee in medicine is that nothing is guaranteed. It is important to note that the decision to proceed with this intervention was based on the information collected from the patient. The Data and conclusions were drawn from the patient's questionnaire, the interview, and the physical examination. Because the information was provided in large part by the patient, it cannot be guaranteed that it has not been purposely or unconsciously manipulated. Every effort has been made to obtain as much relevant data as possible for this evaluation. It is important to note that the conclusions that lead to this procedure are derived in large part from the available data. Always take into account that the treatment will also be dependent on availability of resources and existing treatment guidelines, considered by other Pain Management Practitioners as being common knowledge and practice, at the time of the intervention. For Medico-Legal purposes, it is also important to point out that  variation in procedural techniques and pharmacological choices are the acceptable norm. The indications, contraindications, technique, and results of the above procedure should only be interpreted and judged by a Board-Certified Interventional Pain Specialist with extensive familiarity and expertise in the same exact procedure and technique.

## 2023-07-10 NOTE — Patient Instructions (Signed)

## 2023-07-10 NOTE — Progress Notes (Signed)
Safety precautions to be maintained throughout the outpatient stay will include: orient to surroundings, keep bed in low position, maintain call bell within reach at all times, provide assistance with transfer out of bed and ambulation.  

## 2023-07-10 NOTE — Progress Notes (Signed)
Follow-Up Visit   Subjective  Ricardo Winters. is a 77 y.o. male who presents for the following: Skin Cancer Screening and Full Body Skin Exam  The patient presents for Total-Body Skin Exam (TBSE) for skin cancer screening and mole check. The patient has spots, moles and lesions to be evaluated, some may be new or changing and the patient may have concern these could be cancer.  Patient with hx of skin cancer at nose treated > 15 years ago. He does have some crusty places at face and one at scalp. Spot at nose has been present for about 1 year.   The following portions of the chart were reviewed this encounter and updated as appropriate: medications, allergies, medical history  Review of Systems:  No other skin or systemic complaints except as noted in HPI or Assessment and Plan.  Objective  Well appearing patient in no apparent distress; mood and affect are within normal limits.  A full examination was performed including scalp, head, eyes, ears, nose, lips, neck, chest, axillae, abdomen, back, buttocks, bilateral upper extremities, bilateral lower extremities, hands, feet, fingers, toes, fingernails, and toenails. All findings within normal limits unless otherwise noted below.   Relevant physical exam findings are noted in the Assessment and Plan.  L temporal scalp at hairline x 1, L temple x 1, L dorsal hand x 2, L forearm x 3, R forearm x 2, R dorsal hand x 1 (10) Erythematous thin papules/macules with gritty scale.   left posterior vertex scalp 12 mm pigmented patch with central clearing SK vs melanoma      Right Abdomen Red vascular papule 5 mm R/o Irritated Angioma       Assessment & Plan   SKIN CANCER SCREENING PERFORMED TODAY.  ACTINIC DAMAGE - Chronic condition, secondary to cumulative UV/sun exposure - diffuse scaly erythematous macules with underlying dyspigmentation - Recommend daily broad spectrum sunscreen SPF 30+ to sun-exposed areas, reapply  every 2 hours as needed.  - Staying in the shade or wearing long sleeves, sun glasses (UVA+UVB protection) and wide brim hats (4-inch brim around the entire circumference of the hat) are also recommended for sun protection.  - Call for new or changing lesions.  LENTIGINES, SEBORRHEIC KERATOSES, HEMANGIOMAS - Benign normal skin lesions - Benign-appearing - Call for any changes  MELANOCYTIC NEVI - Tan-brown and/or pink-flesh-colored symmetric macules and papules - Benign appearing on exam today - Observation - Call clinic for new or changing moles - Recommend daily use of broad spectrum spf 30+ sunscreen to sun-exposed areas.   History of Skin Cancer  Clear. Observe for recurrence.  Call clinic for new or changing lesions.   Recommend regular skin exams, daily broad-spectrum spf 30+ sunscreen use, and photoprotection.     - nose, treated by Dr. Chestine Spore > 15 years ago    AK (actinic keratosis) (10) L temporal scalp at hairline x 1, L temple x 1, L dorsal hand x 2, L forearm x 3, R forearm x 2, R dorsal hand x 1  Actinic keratoses are precancerous spots that appear secondary to cumulative UV radiation exposure/sun exposure over time. They are chronic with expected duration over 1 year. A portion of actinic keratoses will progress to squamous cell carcinoma of the skin. It is not possible to reliably predict which spots will progress to skin cancer and so treatment is recommended to prevent development of skin cancer.  Recommend daily broad spectrum sunscreen SPF 30+ to sun-exposed areas, reapply every 2 hours  as needed.  Recommend staying in the shade or wearing long sleeves, sun glasses (UVA+UVB protection) and wide brim hats (4-inch brim around the entire circumference of the hat). Call for new or changing lesions.   Destruction of lesion - L temporal scalp at hairline x 1, L temple x 1, L dorsal hand x 2, L forearm x 3, R forearm x 2, R dorsal hand x 1 (10)  Destruction method:  cryotherapy   Informed consent: discussed and consent obtained   Lesion destroyed using liquid nitrogen: Yes   Cryotherapy cycles:  2 Outcome: patient tolerated procedure well with no complications   Post-procedure details: wound care instructions given    Neoplasm of uncertain behavior of skin (2) left posterior vertex scalp  Skin / nail biopsy Type of biopsy: tangential   Informed consent: discussed and consent obtained   Timeout: patient name, date of birth, surgical site, and procedure verified   Procedure prep:  Patient was prepped and draped in usual sterile fashion Prep type:  Isopropyl alcohol Anesthesia: the lesion was anesthetized in a standard fashion   Anesthetic:  1% lidocaine w/ epinephrine 1-100,000 buffered w/ 8.4% NaHCO3 Instrument used: DermaBlade   Hemostasis achieved with: pressure, aluminum chloride and electrodesiccation   Outcome: patient tolerated procedure well   Post-procedure details: sterile dressing applied and wound care instructions given   Dressing type: bandage and petrolatum    Specimen 1 - Surgical pathology Differential Diagnosis: SK vs melanoma  Check Margins: yes 12 mm pigmented patch with central clearing   Right Abdomen  Epidermal / dermal shaving  Lesion diameter (cm):  0.5 Informed consent: discussed and consent obtained   Timeout: patient name, date of birth, surgical site, and procedure verified   Procedure prep:  Patient was prepped and draped in usual sterile fashion Prep type:  Isopropyl alcohol Anesthesia: the lesion was anesthetized in a standard fashion   Anesthetic:  1% lidocaine w/ epinephrine 1-100,000 buffered w/ 8.4% NaHCO3 Instrument used: DermaBlade   Hemostasis achieved with: pressure, aluminum chloride and electrodesiccation   Outcome: patient tolerated procedure well   Post-procedure details: sterile dressing applied and wound care instructions given   Dressing type: bandage and petrolatum    Specimen 2 -  Surgical pathology Differential Diagnosis: R/o Irritated Angioma  Check Margins: No Red vascular papule 5 mm   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 Katrinka Blazing, Hx Skin Cancer, Hx AK (30 min appt).  Anise Salvo, RMA, am acting as scribe for Elie Goody, MD .   Documentation: I have reviewed the above documentation for accuracy and completeness, and I agree with the above.  Elie Goody, MD

## 2023-07-11 ENCOUNTER — Telehealth: Payer: Self-pay | Admitting: Student in an Organized Health Care Education/Training Program

## 2023-07-11 ENCOUNTER — Telehealth: Payer: Self-pay | Admitting: *Deleted

## 2023-07-11 NOTE — Telephone Encounter (Signed)
No problems post procedure. 

## 2023-07-11 NOTE — Telephone Encounter (Signed)
Returned patient phone call and explained how the process goes and what he should expect from Ropi and then steroids. Patient verbalizes u/o information.

## 2023-07-11 NOTE — Telephone Encounter (Signed)
PT states that he spoke with a nurse early today. PT wants to know if he had an nerve block done yesterday why is he in so much pain than before. Please give patient a call. TY

## 2023-07-16 ENCOUNTER — Telehealth: Payer: Self-pay | Admitting: Dermatology

## 2023-07-16 LAB — SURGICAL PATHOLOGY

## 2023-07-16 NOTE — Telephone Encounter (Signed)
Called to share melanoma result on biopsy but patient did not answer

## 2023-07-17 ENCOUNTER — Telehealth: Payer: Self-pay | Admitting: Dermatology

## 2023-07-17 NOTE — Telephone Encounter (Signed)
Spoke to patient by phone regarding biopsy results. 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 Potential to spread and cause harm if not treated. Nearly 100% cure with Mohs surgery. Patient requested my recommendation for Mohs surgeon. Recommended Dr Caprice Beaver at [facility]. Cannot say when lesion first developed but it was found at an early stage. All questions answered

## 2023-07-18 ENCOUNTER — Telehealth: Payer: Self-pay

## 2023-07-18 DIAGNOSIS — D034 Melanoma in situ of scalp and neck: Secondary | ICD-10-CM

## 2023-07-18 NOTE — Telephone Encounter (Signed)
Called patient. Scheduled appointment 10/10/2023 for TBSE. Referral sent to [facility]. Patient advised referral sent and [facility] does take his insurance.

## 2023-07-18 NOTE — Telephone Encounter (Signed)
-----   Message from [place] sent at 07/17/2023  8:26 PM EDT ----- Hi team,  I called this patient with the melanoma in situ diagnosis on the scalp. He asked for my recommendation of Mohs surgeon and I said Dr Caprice Beaver at [facility] (Dr Sallee Lange doesn't do melanomas). Please send referral to [facility] Mohs Dr Lorn Junes. Patient hopes to have it done by 9/30 but that may not be possible  Please also call to schedule 3 month skin check  Thank you, Dr Katrinka Blazing

## 2023-07-19 ENCOUNTER — Telehealth: Payer: Self-pay

## 2023-07-19 DIAGNOSIS — M5136 Other intervertebral disc degeneration, lumbar region: Secondary | ICD-10-CM | POA: Diagnosis not present

## 2023-07-19 DIAGNOSIS — M9903 Segmental and somatic dysfunction of lumbar region: Secondary | ICD-10-CM | POA: Diagnosis not present

## 2023-07-19 DIAGNOSIS — M9905 Segmental and somatic dysfunction of pelvic region: Secondary | ICD-10-CM | POA: Diagnosis not present

## 2023-07-19 DIAGNOSIS — M5417 Radiculopathy, lumbosacral region: Secondary | ICD-10-CM | POA: Diagnosis not present

## 2023-07-24 ENCOUNTER — Ambulatory Visit: Payer: PPO | Admitting: Student in an Organized Health Care Education/Training Program

## 2023-07-29 ENCOUNTER — Ambulatory Visit: Payer: PPO | Admitting: Student in an Organized Health Care Education/Training Program

## 2023-07-31 ENCOUNTER — Ambulatory Visit: Payer: PPO | Admitting: Dermatology

## 2023-08-09 ENCOUNTER — Telehealth: Payer: Self-pay | Admitting: Student in an Organized Health Care Education/Training Program

## 2023-08-09 NOTE — Telephone Encounter (Signed)
PT called states that he is still having pain in his left knee. PT wants to know if it's another treatment or procedure that Lateef can do. PT states that he didn't see no need to keep doing the same injection. I ask patient if he wanted to go ahead and schedule a Eval to come in to discuss. PT just wanted to send msg to [facility] Please give patient a call. TY

## 2023-08-20 ENCOUNTER — Telehealth: Payer: Self-pay

## 2023-08-20 NOTE — Telephone Encounter (Signed)
Patient called and left nurse VM that he would like [facility] referral sent to [facility] to see if he can get in quicker then 09/16/23 (appt at [facility]).  Referral emailed. Left voicemail for patient to return my call and let me know what office he decides to do [facility] with so we can update our records.

## 2023-09-16 DIAGNOSIS — L814 Other melanin hyperpigmentation: Secondary | ICD-10-CM | POA: Diagnosis not present

## 2023-09-16 DIAGNOSIS — D034 Melanoma in situ of scalp and neck: Secondary | ICD-10-CM | POA: Diagnosis not present

## 2023-09-16 DIAGNOSIS — L988 Other specified disorders of the skin and subcutaneous tissue: Secondary | ICD-10-CM | POA: Diagnosis not present

## 2023-09-16 DIAGNOSIS — L578 Other skin changes due to chronic exposure to nonionizing radiation: Secondary | ICD-10-CM | POA: Diagnosis not present

## 2023-09-23 DIAGNOSIS — M9905 Segmental and somatic dysfunction of pelvic region: Secondary | ICD-10-CM | POA: Diagnosis not present

## 2023-09-23 DIAGNOSIS — M5136 Other intervertebral disc degeneration, lumbar region with discogenic back pain only: Secondary | ICD-10-CM | POA: Diagnosis not present

## 2023-09-23 DIAGNOSIS — M5417 Radiculopathy, lumbosacral region: Secondary | ICD-10-CM | POA: Diagnosis not present

## 2023-09-23 DIAGNOSIS — M9903 Segmental and somatic dysfunction of lumbar region: Secondary | ICD-10-CM | POA: Diagnosis not present

## 2023-10-10 ENCOUNTER — Ambulatory Visit: Payer: PPO | Admitting: Dermatology

## 2023-10-10 ENCOUNTER — Encounter: Payer: Self-pay | Admitting: Dermatology

## 2023-10-10 DIAGNOSIS — L578 Other skin changes due to chronic exposure to nonionizing radiation: Secondary | ICD-10-CM | POA: Diagnosis not present

## 2023-10-10 DIAGNOSIS — D225 Melanocytic nevi of trunk: Secondary | ICD-10-CM

## 2023-10-10 DIAGNOSIS — Z1283 Encounter for screening for malignant neoplasm of skin: Secondary | ICD-10-CM

## 2023-10-10 DIAGNOSIS — D239 Other benign neoplasm of skin, unspecified: Secondary | ICD-10-CM

## 2023-10-10 DIAGNOSIS — C4491 Basal cell carcinoma of skin, unspecified: Secondary | ICD-10-CM

## 2023-10-10 DIAGNOSIS — C44519 Basal cell carcinoma of skin of other part of trunk: Secondary | ICD-10-CM | POA: Diagnosis not present

## 2023-10-10 DIAGNOSIS — Z86006 Personal history of melanoma in-situ: Secondary | ICD-10-CM | POA: Insufficient documentation

## 2023-10-10 DIAGNOSIS — D229 Melanocytic nevi, unspecified: Secondary | ICD-10-CM

## 2023-10-10 DIAGNOSIS — L814 Other melanin hyperpigmentation: Secondary | ICD-10-CM | POA: Diagnosis not present

## 2023-10-10 DIAGNOSIS — D692 Other nonthrombocytopenic purpura: Secondary | ICD-10-CM

## 2023-10-10 DIAGNOSIS — D2371 Other benign neoplasm of skin of right lower limb, including hip: Secondary | ICD-10-CM | POA: Diagnosis not present

## 2023-10-10 DIAGNOSIS — D1801 Hemangioma of skin and subcutaneous tissue: Secondary | ICD-10-CM | POA: Diagnosis not present

## 2023-10-10 DIAGNOSIS — L821 Other seborrheic keratosis: Secondary | ICD-10-CM | POA: Diagnosis not present

## 2023-10-10 DIAGNOSIS — L820 Inflamed seborrheic keratosis: Secondary | ICD-10-CM | POA: Diagnosis not present

## 2023-10-10 DIAGNOSIS — W908XXA Exposure to other nonionizing radiation, initial encounter: Secondary | ICD-10-CM | POA: Diagnosis not present

## 2023-10-10 DIAGNOSIS — L570 Actinic keratosis: Secondary | ICD-10-CM

## 2023-10-10 DIAGNOSIS — D492 Neoplasm of unspecified behavior of bone, soft tissue, and skin: Secondary | ICD-10-CM

## 2023-10-10 HISTORY — DX: Basal cell carcinoma of skin, unspecified: C44.91

## 2023-10-10 NOTE — Progress Notes (Signed)
Follow-Up Visit   Subjective  Ricardo Winters. is a 77 y.o. male who presents for the following: Skin Cancer Screening and Full Body Skin Exam. Hx of MIS. Hx of AKs. Hx of NMSC. Area of concern on chest. Rubbed, irritated.   The patient presents for Total-Body Skin Exam (TBSE) for skin cancer screening and mole check. The patient has spots, moles and lesions to be evaluated, some may be new or changing and the patient may have concern these could be cancer.    The following portions of the chart were reviewed this encounter and updated as appropriate: medications, allergies, medical history  Review of Systems:  No other skin or systemic complaints except as noted in HPI or Assessment and Plan.  Objective  Well appearing patient in no apparent distress; mood and affect are within normal limits.  A full examination was performed including scalp, head, eyes, ears, nose, lips, neck, chest, axillae, abdomen, back, buttocks, bilateral upper extremities, bilateral lower extremities, hands, feet, fingers, toes, fingernails, and toenails. All findings within normal limits unless otherwise noted below.   Relevant physical exam findings are noted in the Assessment and Plan.  Right upper back 5 mm pink papule with surrounding pigment  R upper chest x1 Erythematous keratotic or waxy stuck-on papule or plaque. R cheek x2, R superior forehead x1 (3) Erythematous thin papules/macules with gritty scale.   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 Bx: 07/10/2023.  Mohs surgery 09/16/2023. Dr. Lorn Junes. - No evidence of recurrence today - No lymphadenopathy  - Recommend regular full body skin exams - Recommend daily broad spectrum sunscreen SPF 30+ to sun-exposed areas, reapply every 2 hours as needed.  - Call if any new or changing lesions are noted between office visits   HISTORY OF SKIN CANCER. Nose. - Clear. Observe for recurrence.  - Call clinic for  new or changing lesions.   - Recommend regular skin exams, daily broad-spectrum spf 30+ sunscreen use, and photoprotection.      -treated by Dr. Chestine Spore > 15 years ago   SKIN CANCER SCREENING PERFORMED TODAY.  ACTINIC DAMAGE - Chronic condition, secondary to cumulative UV/sun exposure - diffuse scaly erythematous macules with underlying dyspigmentation - Recommend daily broad spectrum sunscreen SPF 30+ to sun-exposed areas, reapply every 2 hours as needed.  - Staying in the shade or wearing long sleeves, sun glasses (UVA+UVB protection) and wide brim hats (4-inch brim around the entire circumference of the hat) are also recommended for sun protection.  - Call for new or changing lesions.  LENTIGINES, SEBORRHEIC KERATOSES, HEMANGIOMAS - Benign normal skin lesions - Benign-appearing - Call for any changes  MELANOCYTIC NEVI - Tan-brown and/or pink-flesh-colored symmetric macules and papules - Benign appearing on exam today - Observation - Call clinic for new or changing moles - Recommend daily use of broad spectrum spf 30+ sunscreen to sun-exposed areas.   SEBORRHEIC KERATOSIS - Stuck-on, waxy, tan-brown papule at right occipital scalp  - Benign-appearing - Discussed benign etiology and prognosis. - Observe - Call for any changes   Purpura - Chronic; persistent and recurrent.  Treatable, but not curable. - Violaceous macules and patches at hands and arms - Benign - Related to trauma, age, sun damage and/or use of blood thinners, chronic use of topical and/or oral steroids - Observe - Can use OTC arnica containing moisturizer such as Dermend Bruise Formula if desired - Call for worsening or other concerns  DERMATOFIBROMA Exam: Firm  pink/brown papulenodule with dimple sign at right lower anterior leg. Treatment Plan: A dermatofibroma is a benign growth possibly related to trauma, such as an insect bite, cut from shaving, or inflamed acne-type bump.  Treatment options to remove  include shave or excision with resulting scar and risk of recurrence.  Since benign-appearing and not bothersome, will observe for now.   SURGICAL SCAR, secondary to varicose vein surgery. Surgical scar at right upper mid back. Patient does not recall what was removed. Thinks could be a cyst. Exam: Dyspigmented smooth lesion at left medial knee. Right upper mid back. Benign-appearing.  Observation.  Call clinic for new or changing lesions. Recommend daily broad spectrum sunscreen SPF 30+, reapply every 2 hours as needed. Treatment: Recommend Serica moisturizing scar formula cream every night or [employer] brand or Mederma silicone scar sheet every night for the first year after a scar appears to help with scar remodeling if desired. Scars remodel on their own for a full year and will gradually improve in appearance over time.     NEOPLASM OF SKIN Right upper back Skin / nail biopsy Type of biopsy: tangential   Informed consent: discussed and consent obtained   Timeout: patient name, date of birth, surgical site, and procedure verified   Procedure prep:  Patient was prepped and draped in usual sterile fashion Prep type:  Isopropyl alcohol Anesthesia: the lesion was anesthetized in a standard fashion   Anesthetic:  1% lidocaine w/ epinephrine 1-100,000 buffered w/ 8.4% NaHCO3 Instrument used: DermaBlade   Hemostasis achieved with: pressure and aluminum chloride   Outcome: patient tolerated procedure well   Post-procedure details: sterile dressing applied and wound care instructions given   Dressing type: bandage and petrolatum   Specimen 1 - Surgical pathology Differential Diagnosis: folliculitis vs ISK vs melanoma vs BCC  Check Margins: No HISTORY OF MELANOMA IN SITU   Left posterior vertex scalp. Bx: 07/10/2023.  Mohs surgery 09/16/2023. Dr. Lorn Junes. INFLAMED SEBORRHEIC KERATOSIS R upper chest x1 Symptomatic, irritating, patient would like treated. Destruction of lesion - R upper  chest x1 Complexity: simple   Destruction method: cryotherapy   Informed consent: discussed and consent obtained   Timeout:  patient name, date of birth, surgical site, and procedure verified Lesion destroyed using liquid nitrogen: Yes   Region frozen until ice ball extended beyond lesion: Yes   Cryo cycles: 1 or 2. Outcome: patient tolerated procedure well with no complications   Post-procedure details: wound care instructions given   Additional details:  Prior to procedure, discussed risks of blister formation, small wound, skin dyspigmentation, or rare scar following cryotherapy. Recommend Vaseline ointment to treated areas while healing.  AK (ACTINIC KERATOSIS) (3) R cheek x2, R superior forehead x1 (3) Actinic keratoses are precancerous spots that appear secondary to cumulative UV radiation exposure/sun exposure over time. They are chronic with expected duration over 1 year. A portion of actinic keratoses will progress to squamous cell carcinoma of the skin. It is not possible to reliably predict which spots will progress to skin cancer and so treatment is recommended to prevent development of skin cancer.  Recommend daily broad spectrum sunscreen SPF 30+ to sun-exposed areas, reapply every 2 hours as needed.  Recommend staying in the shade or wearing long sleeves, sun glasses (UVA+UVB protection) and wide brim hats (4-inch brim around the entire circumference of the hat). Call for new or changing lesions. Destruction of lesion - R cheek x2, R superior forehead x1 (3) Complexity: simple   Destruction method: cryotherapy  Informed consent: discussed and consent obtained   Timeout:  patient name, date of birth, surgical site, and procedure verified Lesion destroyed using liquid nitrogen: Yes   Region frozen until ice ball extended beyond lesion: Yes   Cryo cycles: 1 or 2. Outcome: patient tolerated procedure well with no complications   Post-procedure details: wound care instructions  given   Additional details:  Prior to procedure, discussed risks of blister formation, small wound, skin dyspigmentation, or rare scar following cryotherapy. Recommend Vaseline ointment to treated areas while healing.  DERMATOFIBROMA   MULTIPLE BENIGN NEVI   LENTIGINES   SEBORRHEIC KERATOSES   ACTINIC ELASTOSIS   CHERRY ANGIOMA     Return in about 3 months (around 01/08/2024) for TBSE, HxMIS.  I, Lawson Radar, CMA, am acting as scribe for Elie Goody, MD.   Documentation: I have reviewed the above documentation for accuracy and completeness, and I agree with the above.  Elie Goody, MD

## 2023-10-10 NOTE — Patient Instructions (Addendum)
Cryotherapy Aftercare  Wash gently with soap and water everyday.   Apply Vaseline and Band-Aid daily until healed.   Wound Care Instructions  Cleanse wound gently with soap and water once a day then pat dry with clean gauze. Apply a thin coat of Petrolatum (petroleum jelly, "Vaseline") over the wound (unless you have an allergy to this). We recommend that you use a new, sterile tube of Vaseline. Do not pick or remove scabs. Do not remove the yellow or white "healing tissue" from the base of the wound.  Cover the wound with fresh, clean, nonstick gauze and secure with paper tape. You may use Band-Aids in place of gauze and tape if the wound is small enough, but would recommend trimming much of the tape off as there is often too much. Sometimes Band-Aids can irritate the skin.  You should call the office for your biopsy report after 1 week if you have not already been contacted.  If you experience any problems, such as abnormal amounts of bleeding, swelling, significant bruising, significant pain, or evidence of infection, please call the office immediately.  FOR ADULT SURGERY PATIENTS: If you need something for pain relief you may take 1 extra strength Tylenol (acetaminophen) AND 2 Ibuprofen (200mg  each) together every 4 hours as needed for pain. (do not take these if you are allergic to them or if you have a reason you should not take them.) Typically, you may only need pain medication for 1 to 3 days.      Recommend daily broad spectrum sunscreen SPF 30+ to sun-exposed areas, reapply every 2 hours as needed. Call for new or changing lesions.  Staying in the shade or wearing long sleeves, sun glasses (UVA+UVB protection) and wide brim hats (4-inch brim around the entire circumference of the hat) are also recommended for sun protection.    Melanoma ABCDEs  Melanoma is the most dangerous type of skin cancer, and is the leading cause of death from skin disease.  You are more likely to develop  melanoma if you: Have light-colored skin, light-colored eyes, or red or blond hair Spend a lot of time in the sun Tan regularly, either outdoors or in a tanning bed Have had blistering sunburns, especially during childhood Have a close family member who has had a melanoma Have atypical moles or large birthmarks  Early detection of melanoma is key since treatment is typically straightforward and cure rates are extremely high if we catch it early.   The first sign of melanoma is often a change in a mole or a new dark spot.  The ABCDE system is a way of remembering the signs of melanoma.  A for asymmetry:  The two halves do not match. B for border:  The edges of the growth are irregular. C for color:  A mixture of colors are present instead of an even brown color. D for diameter:  Melanomas are usually (but not always) greater than 6mm - the size of a pencil eraser. E for evolution:  The spot keeps changing in size, shape, and color.  Please check your skin once per month between visits. You can use a small mirror in front and a large mirror behind you to keep an eye on the back side or your body.   If you see any new or changing lesions before your next follow-up, please call to schedule a visit.  Please continue daily skin protection including broad spectrum sunscreen SPF 30+ to sun-exposed areas, reapplying every 2 hours  as needed when you're outdoors.   Staying in the shade or wearing long sleeves, sun glasses (UVA+UVB protection) and wide brim hats (4-inch brim around the entire circumference of the hat) are also recommended for sun protection.    Due to recent changes in healthcare laws, you may see results of your pathology and/or laboratory studies on MyChart before the doctors have had a chance to review them. We understand that in some cases there may be results that are confusing or concerning to you. Please understand that not all results are received at the same time and often the  doctors may need to interpret multiple results in order to provide you with the best plan of care or course of treatment. Therefore, we ask that you please give [place] 2 business days to thoroughly review all your results before contacting the office for clarification. Should we see a critical lab result, you will be contacted sooner.   If You Need Anything After Your Visit  If you have any questions or concerns for your doctor, please call our main line at [phone] and press option 4 to reach your doctor's medical assistant. If no one answers, please leave a voicemail as directed and we will return your call as soon as possible. Messages left after 4 pm will be answered the following business day.   You may also send [place] a message via MyChart. We typically respond to MyChart messages within 1-2 business days.  For prescription refills, please ask your pharmacy to contact our office. Our fax number is [phone].  If you have an urgent issue when the clinic is closed that cannot wait until the next business day, you can page your doctor at the number below.    Please note that while we do our best to be available for urgent issues outside of office hours, we are not available 24/7.   If you have an urgent issue and are unable to reach [place], you may choose to seek medical care at your doctor's office, retail clinic, urgent care center, or emergency room.  If you have a medical emergency, please immediately call 911 or go to the emergency department.  Pager Numbers  - Dr. Gwen Pounds: [phone]  - Dr. Roseanne Reno: [phone]  - Dr. Katrinka Blazing: [phone]   In the event of inclement weather, please call our main line at [phone] for an update on the status of any delays or closures.  Dermatology Medication Tips: Please keep the boxes that topical medications come in in order to help keep track of the instructions about where and how to use these. Pharmacies typically print the medication  instructions only on the boxes and not directly on the medication tubes.   If your medication is too expensive, please contact our office at [phone] option 4 or send [place] a message through MyChart.   We are unable to tell what your co-pay for medications will be in advance as this is different depending on your insurance coverage. However, we may be able to find a substitute medication at lower cost or fill out paperwork to get insurance to cover a needed medication.   If a prior authorization is required to get your medication covered by your insurance company, please allow [place] 1-2 business days to complete this process.  Drug prices often vary depending on where the prescription is filled and some pharmacies may offer cheaper prices.  The website www.goodrx.com contains coupons for medications through different pharmacies. The prices here do not account for  what the cost may be with help from insurance (it may be cheaper with your insurance), but the website can give you the price if you did not use any insurance.  - You can print the associated coupon and take it with your prescription to the pharmacy.  - You may also stop by our office during regular business hours and pick up a GoodRx coupon card.  - If you need your prescription sent electronically to a different pharmacy, notify our office through [facility] or by phone at [phone] option 4.     Si Usted Necesita Algo Despus de Su Visita  Tambin puede enviarnos un mensaje a travs de Clinical cytogeneticist. Por lo general respondemos a los mensajes de MyChart en el transcurso de 1 a 2 das hbiles.  Para renovar recetas, por favor pida a su farmacia que se ponga en contacto con nuestra oficina. Annie Sable de fax es [place] [phone].  Si tiene un asunto urgente cuando la clnica est cerrada y que no puede esperar hasta el siguiente da hbil, puede llamar/localizar a su doctor(a) al nmero que aparece a continuacin.   Por  favor, tenga en cuenta que aunque hacemos todo lo posible para estar disponibles para asuntos urgentes fuera del horario de [place], no estamos disponibles las 24 horas del da, los 7 [address]  [address] de la [place].   Si tiene un problema urgente y no puede comunicarse con nosotros, puede optar por buscar atencin mdica  en el consultorio de su doctor(a), en una clnica privada, en un centro de atencin urgente o en una sala de emergencias.  Si tiene Engineer, drilling, por favor llame inmediatamente al 911 o vaya a la sala de emergencias.  Nmeros de bper  - Dr. Gwen Pounds: [phone]  - Dra. Roseanne Reno: [phone]  - Dr. Katrinka Blazing: [phone]   En caso de inclemencias del tiempo, por favor llame a Lacy Duverney principal al [phone] para una actualizacin sobre el [place] de cualquier retraso o cierre.  Consejos para la medicacin en dermatologa: Por favor, guarde las cajas en las que vienen los medicamentos de uso tpico para ayudarle a seguir las instrucciones sobre dnde y cmo usarlos. Las farmacias generalmente imprimen las instrucciones del medicamento slo en las cajas y no directamente en los tubos del [place].   Si su medicamento es muy caro, por favor, pngase en contacto con Rolm Gala llamando al [phone] y presione la opcin 4 o envenos un mensaje a travs de Clinical cytogeneticist.   No podemos decirle cul ser su copago por los medicamentos por adelantado ya que esto es diferente dependiendo de la cobertura de su seguro. Sin embargo, es posible que podamos encontrar un medicamento sustituto a Audiological scientist un formulario para que el seguro cubra el medicamento que se considera necesario.   Si se requiere una autorizacin previa para que su compaa de seguros [place] su medicamento, por favor permtanos de 1 a 2 das hbiles para completar [address]  Los precios de los medicamentos varan con frecuencia dependiendo del Environmental consultant de dnde se surte la receta y alguna farmacias  pueden ofrecer precios ms baratos.  El sitio web www.goodrx.com tiene cupones para medicamentos de Health and safety inspector. Los precios aqu no tienen en cuenta lo que podra costar con la ayuda del seguro (puede ser ms barato con su seguro), pero el sitio web puede darle el precio si no utiliz Tourist information centre manager.  - Puede imprimir el cupn correspondiente y llevarlo con su receta a la farmacia.  Laroy Apple  puede pasar por nuestra oficina durante el horario de atencin regular y Education officer, museum una tarjeta de cupones de GoodRx.  - Si necesita que su receta se enve electrnicamente a una farmacia diferente, informe a nuestra oficina a travs de MyChart de [place] o por telfono llamando al [phone] y presione la opcin 4.

## 2023-10-16 LAB — SURGICAL PATHOLOGY

## 2023-10-17 ENCOUNTER — Telehealth: Payer: Self-pay

## 2023-10-17 NOTE — Telephone Encounter (Signed)
[facility] pathology shows BCC. Please call to schedule excision.

## 2023-10-17 NOTE — Telephone Encounter (Signed)
-----   Message from Goodell sent at 10/17/2023  9:54 AM EST ----- Diagnosis: BASAL CELL CARCINOMA, NODULAR PATTERN AND DYSPLASTIC JUNCTIONAL LENTIGINOUS       NEVUS WITH MODERATE ATYPIA, CLOSE TO MARGIN    Please call to share diagnosis and discuss treatment options.  Explanation: your biopsy shows basal cell skin cancers in the second layer of the skin. This is the most common kind of skin cancer and is caused by damage from sun exposure. Basal cell skin cancers almost never spread beyond the skin, so they are not dangerous to your overall health. However, they will continue to grow, can bleed, cause nonhealing wounds, and disrupt nearby structures unless fully treated. The biopsy also shows an atypical mole that will be removed with the basal cell skin cancer  Treatment: Excision - you return for an hour long appointment in our clinic where we perform a skin surgery. We numb the site of the skin cancer and a safety margin of normal skin around it. We remove the full thickness of skin and close the wound with two layers of stitches. The sample is sent to the lab to check that the skin cancer was fully removed. Return one week later to have wound checked and surface stitches removed. Surgical wound leaves a line scar. Approximately 95% cure rate. Risk of recurrence, bleeding, infection, pain, injury to nearby structures, hypertrophic scar.

## 2023-10-28 ENCOUNTER — Telehealth: Payer: Self-pay

## 2023-10-28 NOTE — Telephone Encounter (Signed)
Patient left a voicemail stating that he knew what the pathology came back as, and our office can schedule the appointment and leave a voicemail with the time and date. I scheduled the appointment and spoke with patient and informed him of time and date of surgery appointment.

## 2023-10-28 NOTE — Telephone Encounter (Signed)
-----   Message from Goodell sent at 10/17/2023  9:54 AM EST ----- Diagnosis: BASAL CELL CARCINOMA, NODULAR PATTERN AND DYSPLASTIC JUNCTIONAL LENTIGINOUS       NEVUS WITH MODERATE ATYPIA, CLOSE TO MARGIN    Please call to share diagnosis and discuss treatment options.  Explanation: your biopsy shows basal cell skin cancers in the second layer of the skin. This is the most common kind of skin cancer and is caused by damage from sun exposure. Basal cell skin cancers almost never spread beyond the skin, so they are not dangerous to your overall health. However, they will continue to grow, can bleed, cause nonhealing wounds, and disrupt nearby structures unless fully treated. The biopsy also shows an atypical mole that will be removed with the basal cell skin cancer  Treatment: Excision - you return for an hour long appointment in our clinic where we perform a skin surgery. We numb the site of the skin cancer and a safety margin of normal skin around it. We remove the full thickness of skin and close the wound with two layers of stitches. The sample is sent to the lab to check that the skin cancer was fully removed. Return one week later to have wound checked and surface stitches removed. Surgical wound leaves a line scar. Approximately 95% cure rate. Risk of recurrence, bleeding, infection, pain, injury to nearby structures, hypertrophic scar.

## 2023-10-28 NOTE — Telephone Encounter (Signed)
Left pt msg to call for bx results/sh 

## 2023-12-18 ENCOUNTER — Encounter: Payer: Self-pay | Admitting: Dermatology

## 2023-12-18 ENCOUNTER — Ambulatory Visit (INDEPENDENT_AMBULATORY_CARE_PROVIDER_SITE_OTHER): Payer: PPO | Admitting: Dermatology

## 2023-12-18 DIAGNOSIS — C44519 Basal cell carcinoma of skin of other part of trunk: Secondary | ICD-10-CM

## 2023-12-18 MED ORDER — MUPIROCIN 2 % EX OINT: 1.0000 | TOPICAL_OINTMENT | Freq: Every day | CUTANEOUS | 0 refills | Status: DC

## 2023-12-18 NOTE — Patient Instructions (Signed)

## 2023-12-18 NOTE — Progress Notes (Addendum)
   Follow-Up Visit   Subjective  Ricardo Winters. is a 78 y.o. male who presents for the following: Excision of bx proven  BASAL CELL CARCINOMA, NODULAR PATTERN AND DYSPLASTIC JUNCTIONAL LENTIGINOUS       NEVUS WITH MODERATE ATYPIA at right upper back  The following portions of the chart were reviewed this encounter and updated as appropriate: medications, allergies, medical history  Review of Systems:  No other skin or systemic complaints except as noted in HPI or Assessment and Plan.  Objective  Well appearing patient in no apparent distress; mood and affect are within normal limits.  A focused examination was performed of the following areas: back Relevant physical exam findings are noted in the Assessment and Plan.   Right Upper Back Healing bx site  Assessment & Plan   BASAL CELL CARCINOMA (BCC) OF SKIN OF OTHER PART OF [place] Right Upper Back Skin excision  Lesion length (cm):  1 Margin per side (cm):  0.4 Total excision diameter (cm):  1.8 Informed consent: discussed and consent obtained   Timeout: patient name, date of birth, surgical site, and procedure verified   Procedure prep:  Patient was prepped and draped in usual sterile fashion Prep type:  Chlorhexidine Anesthesia: the lesion was anesthetized in a standard fashion   Anesthetic:  1% lidocaine w/ epinephrine 1-100,000 buffered w/ 8.4% NaHCO3 (6 cc lido w/epi,  4.5 cc bupivicaine) Instrument used: #15 blade   Hemostasis achieved with: pressure   Outcome: patient tolerated procedure well with no complications    Skin repair Complexity:  Intermediate Final length (cm):  5 Informed consent: discussed and consent obtained   Timeout: patient name, date of birth, surgical site, and procedure verified   Procedure prep:  Patient was prepped and draped in usual sterile fashion Prep type:  Chlorhexidine Anesthesia: the lesion was anesthetized in a standard fashion   Anesthetic:  1% lidocaine w/ epinephrine  1-100,000 buffered w/ 8.4% NaHCO3 Reason for type of repair: reduce tension to allow closure, reduce the risk of dehiscence, infection, and necrosis, reduce subcutaneous dead space and avoid a hematoma, allow closure of the large defect and preserve normal anatomy   Undermining: edges could be approximated without difficulty   Subcutaneous layers (deep stitches):  Suture size:  3-0 Suture type: Monocryl (poliglecaprone 25)   Stitches:  Buried vertical mattress Fine/surface layer approximation (top stitches):  Suture size:  4-0 Suture type: Prolene (polypropylene)   Stitches: simple interrupted and simple running   Suture removal (days):  7 Hemostasis achieved with: suture, pressure and electrodesiccation Outcome: patient tolerated procedure well with no complications   Post-procedure details: sterile dressing applied and wound care instructions given   Dressing type: petrolatum, bandage and pressure dressing   Additional details:  Tagged lateral Specimen 1 - Surgical pathology Differential Diagnosis: BX proven  BASAL CELL CARCINOMA, NODULAR PATTERN AND DYSPLASTIC JUNCTIONAL LENTIGINOUS       NEVUS WITH MODERATE ATYPIA    Check Margins: yes Healing bx site [id number] Tagged lateral Related Medications mupirocin ointment (BACTROBAN) 2 % Apply 1 Application topically daily.   Return in about 1 week (around 12/25/2023) for Suture Removal.  Anise Salvo, RMA, am acting as scribe for Elie Goody, MD .   Documentation: I have reviewed the above documentation for accuracy and completeness, and I agree with the above.  Elie Goody, MD

## 2023-12-19 ENCOUNTER — Encounter: Payer: Self-pay | Admitting: Dermatology

## 2023-12-20 LAB — SURGICAL PATHOLOGY

## 2023-12-23 ENCOUNTER — Encounter: Payer: Self-pay | Admitting: Dermatology

## 2023-12-25 ENCOUNTER — Ambulatory Visit (INDEPENDENT_AMBULATORY_CARE_PROVIDER_SITE_OTHER): Payer: PPO | Admitting: Dermatology

## 2023-12-25 ENCOUNTER — Encounter: Payer: Self-pay | Admitting: Dermatology

## 2023-12-25 DIAGNOSIS — L821 Other seborrheic keratosis: Secondary | ICD-10-CM

## 2023-12-25 DIAGNOSIS — L820 Inflamed seborrheic keratosis: Secondary | ICD-10-CM | POA: Diagnosis not present

## 2023-12-25 DIAGNOSIS — W908XXA Exposure to other nonionizing radiation, initial encounter: Secondary | ICD-10-CM

## 2023-12-25 DIAGNOSIS — Z4802 Encounter for removal of sutures: Secondary | ICD-10-CM

## 2023-12-25 DIAGNOSIS — Z5189 Encounter for other specified aftercare: Secondary | ICD-10-CM

## 2023-12-25 DIAGNOSIS — L578 Other skin changes due to chronic exposure to nonionizing radiation: Secondary | ICD-10-CM

## 2023-12-25 DIAGNOSIS — Z85828 Personal history of other malignant neoplasm of skin: Secondary | ICD-10-CM

## 2023-12-25 NOTE — Patient Instructions (Signed)

## 2023-12-25 NOTE — Progress Notes (Signed)
   Follow-Up Visit   Subjective  Ricardo Winters. is a 78 y.o. male who presents for the following: Suture removal and some irritated spots at back that itch.  Pathology showed RESIDUAL DYSPLASTIC NEVUS, MARGIN FREE       NOTE: NO RESIDUAL BASAL CELL CARCINOMA IS PRESENT   The following portions of the chart were reviewed this encounter and updated as appropriate: medications, allergies, medical history  Review of Systems:  No other skin or systemic complaints except as noted in HPI or Assessment and Plan.  Objective  Well appearing patient in no apparent distress; mood and affect are within normal limits.  Areas Examined: Back, shoulder Relevant physical exam findings are noted in the Assessment and Plan.  back x 40, right nose x 1 (41) Erythematous stuck-on, waxy papule or plaque  Assessment & Plan   SEBORRHEIC KERATOSIS - Stuck-on, waxy, tan-brown papules and/or plaques  - Benign-appearing - Discussed benign etiology and prognosis. - Observe - Call for any changes  ACTINIC DAMAGE - chronic, secondary to cumulative UV radiation exposure/sun exposure over time - diffuse scaly erythematous macules with underlying dyspigmentation - Recommend daily broad spectrum sunscreen SPF 30+ to sun-exposed areas, reapply every 2 hours as needed.  - Recommend staying in the shade or wearing long sleeves, sun glasses (UVA+UVB protection) and wide brim hats (4-inch brim around the entire circumference of the hat). - Call for new or changing lesions.  INFLAMED SEBORRHEIC KERATOSIS (41) back x 40, right nose x 1 (41) Symptomatic, irritating, patient would like treated.  Benign-appearing.  Call clinic for new or changing lesions.   Destruction of lesion - back x 40, right nose x 1 (41) Complexity: simple   Destruction method: cryotherapy   Informed consent: discussed and consent obtained   Timeout:  patient name, date of birth, surgical site, and procedure verified Lesion destroyed  using liquid nitrogen: Yes   Region frozen until ice ball extended beyond lesion: Yes   Cryo cycles: 1 or 2. Outcome: patient tolerated procedure well with no complications   Post-procedure details: wound care instructions given   VISIT FOR WOUND CHECK   ENCOUNTER FOR REMOVAL OF SUTURES   Encounter for Removal of Sutures - Incision site is clean, dry and intact. - Wound cleansed, sutures removed, wound cleansed and steri strips applied.  - Discussed pathology results showing RESIDUAL DYSPLASTIC NEVUS, MARGIN FREE       NOTE: NO RESIDUAL BASAL CELL CARCINOMA IS PRESENT  - Patient advised to keep steri-strips dry until they fall off. - Scars remodel for a full year. - Once steri-strips fall off, patient can apply over-the-counter silicone scar cream once to twice a day to help with scar remodeling if desired. - Patient advised to call with any concerns or if they notice any new or changing lesions.  Return for TBSE, with Dr. Katrinka Blazing, as scheduled, HxMMis, HxBCC.  Anise Salvo, RMA, am acting as scribe for Elie Goody, MD .   Documentation: I have reviewed the above documentation for accuracy and completeness, and I agree with the above.  Elie Goody, MD

## 2023-12-31 ENCOUNTER — Encounter: Payer: Self-pay | Admitting: Dermatology

## 2024-01-07 ENCOUNTER — Encounter: Payer: Self-pay | Admitting: Dermatology

## 2024-01-07 ENCOUNTER — Ambulatory Visit: Payer: PPO | Admitting: Dermatology

## 2024-01-07 DIAGNOSIS — Z1283 Encounter for screening for malignant neoplasm of skin: Secondary | ICD-10-CM

## 2024-01-07 DIAGNOSIS — W908XXA Exposure to other nonionizing radiation, initial encounter: Secondary | ICD-10-CM

## 2024-01-07 DIAGNOSIS — L578 Other skin changes due to chronic exposure to nonionizing radiation: Secondary | ICD-10-CM

## 2024-01-07 DIAGNOSIS — L820 Inflamed seborrheic keratosis: Secondary | ICD-10-CM

## 2024-01-07 DIAGNOSIS — L814 Other melanin hyperpigmentation: Secondary | ICD-10-CM

## 2024-01-07 DIAGNOSIS — Z85828 Personal history of other malignant neoplasm of skin: Secondary | ICD-10-CM

## 2024-01-07 DIAGNOSIS — D239 Other benign neoplasm of skin, unspecified: Secondary | ICD-10-CM

## 2024-01-07 DIAGNOSIS — D2371 Other benign neoplasm of skin of right lower limb, including hip: Secondary | ICD-10-CM

## 2024-01-07 DIAGNOSIS — D1801 Hemangioma of skin and subcutaneous tissue: Secondary | ICD-10-CM

## 2024-01-07 DIAGNOSIS — L821 Other seborrheic keratosis: Secondary | ICD-10-CM

## 2024-01-07 DIAGNOSIS — Z86006 Personal history of melanoma in-situ: Secondary | ICD-10-CM

## 2024-01-07 DIAGNOSIS — D229 Melanocytic nevi, unspecified: Secondary | ICD-10-CM

## 2024-01-07 NOTE — Progress Notes (Signed)
   Follow-Up Visit   Subjective  Ricardo Winters. is a 78 y.o. male who presents for the following: Skin Cancer Screening and Full Body Skin Exam  The patient presents for Total-Body Skin Exam (TBSE) for skin cancer screening and mole check. The patient has spots, moles and lesions to be evaluated, some may be new or changing and the patient may have concern these could be cancer.  Hx BCC, MMis. A few areas at belt line that get irritated.   The following portions of the chart were reviewed this encounter and updated as appropriate: medications, allergies, medical history  Review of Systems:  No other skin or systemic complaints except as noted in HPI or Assessment and Plan.  Objective  Well appearing patient in no apparent distress; mood and affect are within normal limits.  A full examination was performed including scalp, head, eyes, ears, nose, lips, neck, chest, axillae, abdomen, back, buttocks, bilateral upper extremities, bilateral lower extremities, hands, feet, fingers, toes, fingernails, and toenails. All findings within normal limits unless otherwise noted below.   Relevant physical exam findings are noted in the Assessment and Plan.    Assessment & Plan   SKIN CANCER SCREENING PERFORMED TODAY.  ACTINIC DAMAGE - Chronic condition, secondary to cumulative UV/sun exposure - diffuse scaly erythematous macules with underlying dyspigmentation - Recommend daily broad spectrum sunscreen SPF 30+ to sun-exposed areas, reapply every 2 hours as needed.  - Staying in the shade or wearing long sleeves, sun glasses (UVA+UVB protection) and wide brim hats (4-inch brim around the entire circumference of the hat) are also recommended for sun protection.  - Call for new or changing lesions.  LENTIGINES, SEBORRHEIC KERATOSES, HEMANGIOMAS - Benign normal skin lesions - Benign-appearing - Call for any changes  MELANOCYTIC NEVI - Tan-brown and/or pink-flesh-colored symmetric macules  and papules - Benign appearing on exam today - Observation - Call clinic for new or changing moles - Recommend daily use of broad spectrum spf 30+ sunscreen to sun-exposed areas.   HISTORY OF BASAL CELL CARCINOMA OF THE SKIN - No evidence of recurrence today - Recommend regular full body skin exams - Recommend daily broad spectrum sunscreen SPF 30+ to sun-exposed areas, reapply every 2 hours as needed.  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   DERMATOFIBROMA Exam: Firm pink/brown papulenodule with dimple sign at right pretibia. Treatment Plan: A dermatofibroma is a benign growth possibly related to trauma, such as an insect bite, cut from shaving, or inflamed acne-type bump.  Treatment options to remove include shave or excision with resulting scar and risk of recurrence.  Since benign-appearing and not bothersome, will observe for now.      Return in about 3 months (around 04/08/2024) for with Dr. Katrinka Blazing, TBSE, HxMMis, HxBCC.  Anise Salvo, RMA, am acting as scribe for Elie Goody, MD .   Documentation: I have reviewed the above documentation for accuracy and completeness, and I agree with the above.  Elie Goody, MD

## 2024-01-07 NOTE — Patient Instructions (Addendum)

## 2024-01-20 ENCOUNTER — Encounter

## 2024-01-20 ENCOUNTER — Other Ambulatory Visit: Payer: Self-pay | Admitting: Internal Medicine

## 2024-01-20 ENCOUNTER — Ambulatory Visit
Admission: RE | Admit: 2024-01-20 | Discharge: 2024-01-20 | Disposition: A | Discharge status: Home / Self Care | Source: Ambulatory Visit | Attending: Internal Medicine | Admitting: Internal Medicine

## 2024-01-20 DIAGNOSIS — I639 Cerebral infarction, unspecified: Secondary | ICD-10-CM

## 2024-01-20 DIAGNOSIS — G119 Hereditary ataxia, unspecified: Secondary | ICD-10-CM | POA: Insufficient documentation

## 2024-01-20 DIAGNOSIS — Z8719 Personal history of other diseases of the digestive system: Secondary | ICD-10-CM | POA: Insufficient documentation

## 2024-01-20 MED ORDER — GADOBUTROL 1 MMOL/ML IV SOLN
10.0000 mL | Freq: Once | INTRAVENOUS | Status: AC | PRN
  Administered 2024-01-20: 10 mL via INTRAVENOUS

## 2024-01-21 DIAGNOSIS — I639 Cerebral infarction, unspecified: Secondary | ICD-10-CM | POA: Insufficient documentation

## 2024-01-22 ENCOUNTER — Other Ambulatory Visit: Payer: Self-pay | Admitting: Internal Medicine

## 2024-01-22 DIAGNOSIS — G464 Cerebellar stroke syndrome: Secondary | ICD-10-CM

## 2024-01-27 ENCOUNTER — Ambulatory Visit
Admission: RE | Admit: 2024-01-27 | Discharge: 2024-01-27 | Disposition: A | Discharge status: Home / Self Care | Source: Ambulatory Visit | Attending: Internal Medicine | Admitting: Internal Medicine

## 2024-01-27 DIAGNOSIS — G464 Cerebellar stroke syndrome: Secondary | ICD-10-CM | POA: Diagnosis present

## 2024-01-27 MED ORDER — IOHEXOL 350 MG/ML SOLN
75.0000 mL | Freq: Once | INTRAVENOUS | Status: AC | PRN
  Administered 2024-01-27: 75 mL via INTRAVENOUS

## 2024-02-05 ENCOUNTER — Other Ambulatory Visit: Payer: Self-pay | Admitting: Physical Medicine & Rehabilitation

## 2024-02-05 DIAGNOSIS — G8929 Other chronic pain: Secondary | ICD-10-CM

## 2024-02-29 ENCOUNTER — Ambulatory Visit
Admission: RE | Admit: 2024-02-29 | Discharge: 2024-02-29 | Disposition: A | Discharge status: Home / Self Care | Source: Ambulatory Visit | Attending: Physical Medicine & Rehabilitation | Admitting: Physical Medicine & Rehabilitation

## 2024-02-29 DIAGNOSIS — G8929 Other chronic pain: Secondary | ICD-10-CM | POA: Insufficient documentation

## 2024-02-29 DIAGNOSIS — M5442 Lumbago with sciatica, left side: Secondary | ICD-10-CM | POA: Insufficient documentation

## 2024-03-03 ENCOUNTER — Encounter: Payer: Self-pay | Admitting: Dermatology

## 2024-03-27 ENCOUNTER — Other Ambulatory Visit: Payer: Self-pay | Admitting: Cardiovascular Disease

## 2024-03-30 ENCOUNTER — Ambulatory Visit

## 2024-03-30 DIAGNOSIS — K449 Diaphragmatic hernia without obstruction or gangrene: Secondary | ICD-10-CM | POA: Diagnosis not present

## 2024-03-30 DIAGNOSIS — K222 Esophageal obstruction: Secondary | ICD-10-CM | POA: Diagnosis not present

## 2024-03-30 DIAGNOSIS — K5730 Diverticulosis of large intestine without perforation or abscess without bleeding: Secondary | ICD-10-CM

## 2024-03-30 DIAGNOSIS — K640 First degree hemorrhoids: Secondary | ICD-10-CM

## 2024-03-30 DIAGNOSIS — D122 Benign neoplasm of ascending colon: Secondary | ICD-10-CM | POA: Diagnosis not present

## 2024-03-30 DIAGNOSIS — D120 Benign neoplasm of cecum: Secondary | ICD-10-CM | POA: Diagnosis not present

## 2024-03-30 DIAGNOSIS — Z09 Encounter for follow-up examination after completed treatment for conditions other than malignant neoplasm: Secondary | ICD-10-CM | POA: Diagnosis not present

## 2024-03-30 DIAGNOSIS — Z8719 Personal history of other diseases of the digestive system: Secondary | ICD-10-CM | POA: Diagnosis not present

## 2024-03-30 DIAGNOSIS — Z860101 Personal history of adenomatous and serrated colon polyps: Secondary | ICD-10-CM | POA: Diagnosis not present

## 2024-04-13 ENCOUNTER — Emergency Department
Admission: EM | Admit: 2024-04-13 | Discharge: 2024-04-13 | Disposition: A | Discharge status: Home / Self Care | Attending: Emergency Medicine | Admitting: Emergency Medicine

## 2024-04-13 ENCOUNTER — Other Ambulatory Visit: Payer: Self-pay

## 2024-04-13 ENCOUNTER — Emergency Department

## 2024-04-13 DIAGNOSIS — R030 Elevated blood-pressure reading, without diagnosis of hypertension: Secondary | ICD-10-CM | POA: Diagnosis not present

## 2024-04-13 DIAGNOSIS — R0789 Other chest pain: Secondary | ICD-10-CM | POA: Diagnosis present

## 2024-04-13 DIAGNOSIS — I2510 Atherosclerotic heart disease of native coronary artery without angina pectoris: Secondary | ICD-10-CM | POA: Insufficient documentation

## 2024-04-13 LAB — BASIC METABOLIC PANEL WITH GFR
Anion gap: 6 (ref 5–15)
BUN: 21 mg/dL (ref 8–23)
CO2: 26 mmol/L (ref 22–32)
Calcium: 9.6 mg/dL (ref 8.9–10.3)
Chloride: 108 mmol/L (ref 98–111)
Creatinine, Ser: 1.17 mg/dL (ref 0.61–1.24)
GFR, Estimated: >60 mL/min (ref >60)
Glucose, Bld: 99 mg/dL (ref 70–99)
Potassium: 3.9 mmol/L (ref 3.5–5.1)
Sodium: 140 mmol/L (ref 135–145)

## 2024-04-13 LAB — CBC
HCT: 46.2 % (ref 39.0–52.0)
Hemoglobin: 16 g/dL (ref 13.0–17.0)
MCH: 32.9 pg (ref 26.0–34.0)
MCHC: 34.6 g/dL (ref 30.0–36.0)
MCV: 94.9 fL (ref 80.0–100.0)
Platelets: 198 10*3/uL (ref 150–400)
RBC: 4.87 MIL/uL (ref 4.22–5.81)
RDW: 12.9 % (ref 11.5–15.5)
WBC: 7.2 10*3/uL (ref 4.0–10.5)
nRBC: 0 % (ref 0.0–0.2)

## 2024-04-13 LAB — TROPONIN I (HIGH SENSITIVITY): Troponin I (High Sensitivity): 9 ng/L (ref <18)

## 2024-04-13 NOTE — ED Triage Notes (Signed)
 Pt states CP since yesterday, pt also endorses lightheadedness. Pt states stent placed in 2020. Pt denies SOB, N/V. NAD noted.

## 2024-04-13 NOTE — ED Provider Notes (Signed)
 [facility] Provider Note    Event Date/Time   First MD Initiated Contact with Patient 04/13/24 1017     (approximate)   History   Chest Pain   HPI  Ricardo Quant. is a 78 y.o. male with a history of CAD status post PCI in 2015, cerebral infarct, B12 deficiency, lumbar radiculopathy who presents with chest pain, acute onset yesterday, described as pressure, and associated with some lightheadedness.  It lasted for a few hours and then resolved.  It reoccurred today and is now also resolved.  He denies associated shortness of breath or any nausea or vomiting.  He has no leg swelling.  He states that his blood pressure medication was recently changed after he been having some episodes of lightheadedness, so he believes that this may be related.  He reports that his blood pressure was 130/90 at home.  Reviewed the past medical records.  The patient's most recent outpatient encounter was with internal medicine on 6/10 for follow-up of his chronic conditions although I am not able to read the actual note.  He has no recent ED visits or hospitalizations.   Physical Exam   Triage Vital Signs: ED Triage Vitals  Encounter Vitals Group     BP 04/13/24 0932 (!) 201/106     Girls Systolic BP Percentile --      Girls Diastolic BP Percentile --      Boys Systolic BP Percentile --      Boys Diastolic BP Percentile --      Pulse Rate 04/13/24 0932 (!) 56     Resp 04/13/24 0932 17     Temp 04/13/24 0932 98.1 F (36.7 C)     Temp Source 04/13/24 0932 Oral     SpO2 04/13/24 0932 96 %     Weight 04/13/24 0927 220 lb 0.3 oz (99.8 kg)     Height 04/13/24 0927 6' 1 (1.854 m)     Head Circumference --      Peak Flow --      Pain Score 04/13/24 0927 3     Pain Loc --      Pain Education --      Exclude from Growth Chart --     Most recent vital signs: Vitals:   04/13/24 0932 04/13/24 1050  BP: (!) 201/106 (!) 221/104  Pulse: (!) 56 (!) 55  Resp: 17 20  Temp:  98.1 F (36.7 C)   SpO2: 96% 97%     General: Alert, well-appearing, no distress.  CV:  Good peripheral perfusion.  Normal heart sounds. Resp:  Normal effort.  Lungs CTAB. Abd:  No distention.  Other:  No peripheral edema.   ED Results / Procedures / Treatments   Labs (all labs ordered are listed, but only abnormal results are displayed) Labs Reviewed  BASIC METABOLIC PANEL WITH GFR  CBC  TROPONIN I (HIGH SENSITIVITY)  TROPONIN I (HIGH SENSITIVITY)     EKG  ED ECG REPORT I, Lind Repine, the attending physician, personally viewed and interpreted this ECG.  Date: 04/13/2024 EKG Time: 0930 Rate: 65 Rhythm: normal sinus rhythm with frequent PVCs QRS Axis: normal Intervals: normal ST/T Wave abnormalities: Nonspecific ST abnormalities Narrative Interpretation: no evidence of acute ischemia    RADIOLOGY  Chest x-ray: I independently viewed and interpreted the images; there is no focal consolidation or edema   PROCEDURES:  Critical Care performed: No  Procedures   MEDICATIONS ORDERED IN ED: Medications - No data  to display   IMPRESSION / MDM / ASSESSMENT AND PLAN / ED COURSE  I reviewed the triage vital signs and the nursing notes.  78 year old male with PMH as noted above presents with atypical, nonexertional chest pain yesterday and again today.  On exam he is overall well-appearing.  He is significantly hypertensive, but other vital signs are normal.  Physical exam is otherwise unremarkable for acute findings.  EKG shows PVCs but is nonischemic.  Differential diagnosis includes, but is not limited to, musculoskeletal chest pain, medication side effect, GERD, hypertension, less likely ACS.  There is no clinical evidence for PE, or for aortic dissection or other vascular etiology, given the intermittent and now resolved symptoms.  Chest x-ray shows no acute findings.  BMP and CBC are normal.  Initial troponin is negative.  Given the patient's elevated risk  related to prior CAD, we will obtain a second troponin and reassess the blood pressure.  Patient's presentation is most consistent with acute presentation with potential threat to life or bodily function.  The patient is on the cardiac monitor to evaluate for evidence of arrhythmia and/or significant heart rate changes.  ----------------------------------------- 12:18 PM on 04/13/2024 -----------------------------------------  The blood pressure is still elevated but is improving.  The patient remains asymptomatic.  He states that he does not want to wait for the second troponin and would like to go home.  He understands that without doing the repeat troponin I cannot fully rule out ACS or other acute cardiac event today, although my clinical suspicion is low.  The patient demonstrates appropriate understanding and full decision-making capacity.  He advises that he will follow-up with his cardiologist Dr. Gollan and will call him directly after the ED visit.  He understands he may return anytime for worsening symptoms or if he changes his mind.   FINAL CLINICAL IMPRESSION(S) / ED DIAGNOSES   Final diagnoses:  Elevated blood pressure reading  Atypical chest pain     Rx / DC Orders   ED Discharge Orders     None        Note:  This document was prepared using Dragon voice recognition software and may include unintentional dictation errors.    Lind Repine, MD 04/13/24 1219

## 2024-04-13 NOTE — Discharge Instructions (Signed)
 Return to the ER for new, worsening, or persistent severe chest pain, difficulty breathing, weakness or lightheadedness, high blood pressure readings, or any other new or worsening symptoms that concern you.  Follow-up with your primary care provider and cardiologist.

## 2024-04-15 ENCOUNTER — Ambulatory Visit: Admitting: Dermatology

## 2024-04-15 ENCOUNTER — Encounter: Payer: Self-pay | Admitting: Dermatology

## 2024-04-15 DIAGNOSIS — Z85828 Personal history of other malignant neoplasm of skin: Secondary | ICD-10-CM

## 2024-04-15 DIAGNOSIS — Z1283 Encounter for screening for malignant neoplasm of skin: Secondary | ICD-10-CM | POA: Diagnosis not present

## 2024-04-15 DIAGNOSIS — D692 Other nonthrombocytopenic purpura: Secondary | ICD-10-CM

## 2024-04-15 DIAGNOSIS — D1801 Hemangioma of skin and subcutaneous tissue: Secondary | ICD-10-CM

## 2024-04-15 DIAGNOSIS — W908XXA Exposure to other nonionizing radiation, initial encounter: Secondary | ICD-10-CM

## 2024-04-15 DIAGNOSIS — L814 Other melanin hyperpigmentation: Secondary | ICD-10-CM | POA: Diagnosis not present

## 2024-04-15 DIAGNOSIS — L84 Corns and callosities: Secondary | ICD-10-CM | POA: Diagnosis not present

## 2024-04-15 DIAGNOSIS — L578 Other skin changes due to chronic exposure to nonionizing radiation: Secondary | ICD-10-CM

## 2024-04-15 DIAGNOSIS — L821 Other seborrheic keratosis: Secondary | ICD-10-CM

## 2024-04-15 DIAGNOSIS — L570 Actinic keratosis: Secondary | ICD-10-CM

## 2024-04-15 DIAGNOSIS — D229 Melanocytic nevi, unspecified: Secondary | ICD-10-CM

## 2024-04-15 DIAGNOSIS — Z86006 Personal history of melanoma in-situ: Secondary | ICD-10-CM

## 2024-04-15 NOTE — Patient Instructions (Signed)

## 2024-04-15 NOTE — Progress Notes (Signed)
 Follow-Up Visit   Subjective  Ricardo Winters. is a 78 y.o. male who presents for the following: Skin Cancer Screening and Full Body Skin Exam  The patient presents for Total-Body Skin Exam (TBSE) for skin cancer screening and mole check. The patient has spots, moles and lesions to be evaluated, some may be new or changing and the patient may have concern these could be cancer.  Hx MMis, BCC. A few rough spots at right side of face patient has noticed.   The following portions of the chart were reviewed this encounter and updated as appropriate: medications, allergies, medical history  Review of Systems:  No other skin or systemic complaints except as noted in HPI or Assessment and Plan.  Objective  Well appearing patient in no apparent distress; mood and affect are within normal limits.  A full examination was performed including scalp, head, eyes, ears, nose, lips, neck, chest, axillae, abdomen, back, buttocks, bilateral upper extremities, bilateral lower extremities, hands, feet, fingers, toes, fingernails, and toenails. All findings within normal limits unless otherwise noted below.   Relevant physical exam findings are noted in the Assessment and Plan.  R Dorsal Hand x 1, R preauricular x 1, R jawline x 1, L dorsal hand x 1 (4) Erythematous thin papules/macules with gritty scale.  Right 5th Toe Erythematous stuck-on, waxy papule or plaque  Assessment & Plan   SKIN CANCER SCREENING PERFORMED TODAY.  ACTINIC DAMAGE - Chronic condition, secondary to cumulative UV/sun exposure - diffuse scaly erythematous macules with underlying dyspigmentation - Recommend daily broad spectrum sunscreen SPF 30+ to sun-exposed areas, reapply every 2 hours as needed.  - Staying in the shade or wearing long sleeves, sun glasses (UVA+UVB protection) and wide brim hats (4-inch brim around the entire circumference of the hat) are also recommended for sun protection.  - Call for new or changing  lesions.  LENTIGINES, SEBORRHEIC KERATOSES, HEMANGIOMAS - Benign normal skin lesions - Benign-appearing - Call for any changes  MELANOCYTIC NEVI - Tan-brown and/or pink-flesh-colored symmetric macules and papules - Benign appearing on exam today - Observation - Call clinic for new or changing moles - Recommend daily use of broad spectrum spf 30+ sunscreen to sun-exposed areas.   HISTORY OF BASAL CELL CARCINOMA OF THE SKIN - Right upper back. BCC with moderately atypical dysplastic nevus. Excision 12/18/23  - No evidence of recurrence today - Recommend regular full body skin exams - Recommend daily broad spectrum sunscreen SPF 30+ to sun-exposed areas, reapply every 2 hours as needed.  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 Mohs 09/16/23 Dr Robert Chimes  - No evidence of recurrence today - Recommend regular full body skin exams - Recommend daily broad spectrum sunscreen SPF 30+ to sun-exposed areas, reapply every 2 hours as needed.  - Call if any new or changing lesions are noted between office visits   AK (ACTINIC KERATOSIS) (4) R Dorsal Hand x 1, R preauricular x 1, R jawline x 1, L dorsal hand x 1 (4) Actinic keratoses are precancerous spots that appear secondary to cumulative UV radiation exposure/sun exposure over time. They are chronic with expected duration over 1 year. A portion of actinic keratoses will progress to squamous cell carcinoma of the skin. It is not possible to reliably predict which spots will progress to skin cancer and so treatment is recommended to prevent development of skin cancer.  Recommend daily broad  spectrum sunscreen SPF 30+ to sun-exposed areas, reapply every 2 hours as needed.  Recommend staying in the shade or wearing long sleeves, sun glasses (UVA+UVB protection) and wide brim hats (4-inch brim around the entire circumference of the hat). Call for new or changing  lesions. Destruction of lesion - R Dorsal Hand x 1, R preauricular x 1, R jawline x 1, L dorsal hand x 1 (4) Complexity: simple   Destruction method: cryotherapy   Informed consent: discussed and consent obtained   Timeout:  patient name, date of birth, surgical site, and procedure verified Lesion destroyed using liquid nitrogen: Yes   Region frozen until ice ball extended beyond lesion: Yes   Cryo cycles: 1 or 2. Outcome: patient tolerated procedure well with no complications   Post-procedure details: wound care instructions given   PRE-ULCERATIVE CORN OR CALLOUS Right 5th Toe Symptomatic, irritating, patient would like treated.  Benign-appearing.  Call clinic for new or changing lesions.   Destruction of lesion - Right 5th Toe Complexity: simple   Destruction method: cryotherapy   Informed consent: discussed and consent obtained   Timeout:  patient name, date of birth, surgical site, and procedure verified Lesion destroyed using liquid nitrogen: Yes   Region frozen until ice ball extended beyond lesion: Yes   Cryo cycles: 1 or 2. Outcome: patient tolerated procedure well with no complications   Post-procedure details: wound care instructions given   MULTIPLE BENIGN NEVI   LENTIGINES   ACTINIC ELASTOSIS   SEBORRHEIC KERATOSES   CHERRY ANGIOMA   SOLAR PURPURA (HCC)   Return in about 3 months (around 07/16/2024) for TBSE, with Dr. Felipe Horton, HxMMis, HxBCC, HxAK.  Ricardo Winters, RMA, am acting as scribe for Harris Liming, MD .   Documentation: I have reviewed the above documentation for accuracy and completeness, and I agree with the above.  Harris Liming, MD

## 2024-04-17 ENCOUNTER — Ambulatory Visit

## 2024-04-17 ENCOUNTER — Encounter: Payer: Self-pay | Admitting: Nurse Practitioner

## 2024-04-17 ENCOUNTER — Ambulatory Visit: Attending: Nurse Practitioner | Admitting: Nurse Practitioner

## 2024-04-17 VITALS — BP 150/82 | HR 56 | Ht 74.0 in | Wt 242.0 lb

## 2024-04-17 DIAGNOSIS — I25118 Atherosclerotic heart disease of native coronary artery with other forms of angina pectoris: Secondary | ICD-10-CM

## 2024-04-17 DIAGNOSIS — R42 Dizziness and giddiness: Secondary | ICD-10-CM | POA: Diagnosis not present

## 2024-04-17 DIAGNOSIS — R55 Syncope and collapse: Secondary | ICD-10-CM

## 2024-04-17 DIAGNOSIS — I493 Ventricular premature depolarization: Secondary | ICD-10-CM

## 2024-04-17 DIAGNOSIS — I10 Essential (primary) hypertension: Secondary | ICD-10-CM

## 2024-04-17 DIAGNOSIS — I5022 Chronic systolic (congestive) heart failure: Secondary | ICD-10-CM

## 2024-04-17 DIAGNOSIS — I679 Cerebrovascular disease, unspecified: Secondary | ICD-10-CM

## 2024-04-17 DIAGNOSIS — I255 Ischemic cardiomyopathy: Secondary | ICD-10-CM

## 2024-04-17 DIAGNOSIS — E785 Hyperlipidemia, unspecified: Secondary | ICD-10-CM | POA: Diagnosis not present

## 2024-04-17 NOTE — Progress Notes (Addendum)
 Office Visit    Patient Name: Ricardo Winters. Date of Encounter: 04/17/2024  Primary Care Provider:  Sari Cunning, MD Primary Cardiologist:  Belva Boyden, MD  Chief Complaint    78 y.o. 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  Past Medical History   Subjective   Past Medical History:  Diagnosis Date   Anginal pain (HCC)    Basal cell carcinoma 10/10/2023   Right upper back. BCC with moderately atypical dysplastic nevus. Excision 12/18/23   Cancer (HCC)    skin - on the nose about 10-15 years ago    Cerebrovascular disease    Coronary artery disease    a. 07/2014 Inf STEMI/PCI: DES -->RCA; b. 03/2020 MV: No sichemia. EF 51%.   COVID-17 Oct 2019   Edema    Heart failure with mid-range ejection fraction (HCC)    a. 03/2020 Echo: EF 45%.   Hypertension    Ischemic cardiomyopathy    a. 03/2020 Echo: EF 45%, mild LVH, mild MR/TR.   Lightheadedness    a. 12/2023 MRI Brain: Mild chronic small vessel ischemic disease.  Small chronic cerebellar infarcts; b. 01/2024 CT/A Head: No acute intracranial abnormality.  Small vessel ischemic changes. No proximal intracranial large vessel occlusion.  Severe stenoses within the mid M2 middle cerebral arteries bilaterally.  Intracranial and left vertebral atherosclerosis.   Melanoma in situ (HCC) 07/10/2023   Left posterior vertex scalp. Mohs 09/16/23 Dr Robert Chimes   Myocardial infarction [facility])    Obesity    Varicose veins of both lower extremities    Past Surgical History:  Procedure Laterality Date   CARDIAC CATHETERIZATION  2015   stent   CATARACT  EXTRACTION W/PHACO Right 11/30/2020   Procedure: CATARACT EXTRACTION PHACO AND INTRAOCULAR LENS PLACEMENT (IOC)  RIGHT 7.70 01:03.4 12.1%;  Surgeon: Annell Kidney, MD;  Location: [facility];  Service: Ophthalmology;  Laterality: Right;  requests early   COLONOSCOPY WITH PROPOFOL  N/A 12/12/2015   Procedure: COLONOSCOPY WITH PROPOFOL ;  Surgeon: Cassie Click, MD;  Location: [facility] END[facility];  Service: Endoscopy;  Laterality: N/A;   KNEE SURGERY     LAPAROTOMY N/A 11/21/2022   Procedure: EXPLORATORY LAPAROTOMY;  Surgeon: Alben Alma, MD;  Location: [facility] ORS;  Service: General;  Laterality: N/A;   SHOULDER ACROMIOPLASTY     TOTAL HIP ARTHROPLASTY Bilateral    Right 2017, left 2020    Allergies  Allergies  Allergen Reactions   Gadolinium Derivatives Other (See Comments)    Patient after contrast injection lost bladder control. No other symptoms. Pt was called by MRI supervisor Irvine Mantis after he left to ensure no other complaints.    Nsaids Other (See Comments)       History of Present Illness      78 y.o. 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  Patient suffered a STEMI in October 2015 and underwent PCI of the right coronary artery at that time.  He subsequently underwent stress testing in June 2021 which showed no  evidence of ischemia.  Echocardiogram at that time showed an EF of 45%.  He has since been followed by Dr. Gollan.   He was last seen in cardiology clinic in August of 2024, at which time he was doing well.  Blood pressure was mildly elevated with plan for him to follow it at home.  Over the past several months, he has been experiencing intermittent  lightheadedness and chest discomfort occurring almost exclusively after eating breakfast and persisting for several hours before resolving spontaneously.  He has been evaluated by MRI of the brain in March 2025, which showed old cerebellar infarcts with subsequent CT and CTA of the head showing small vessel ischemic changes, cerebellar infarcts, severe stenosis in the mid M2 middle cerebral arteries bilaterally, intracranial, and left vertebral atherosclerosis.  He was subsequently placed on Plavix and statin therapy, which he has tolerated.  Unfortunately, he has not continued to have lightheaded spells in the morning and his losartan  dose was recently reduced from 100 to 50 mg and he was advised take in the evening.  On June 16, patient presented to the ED after experiencing a similar episode of chest tightness and lightheadedness occurring at home.  There, he was hypertensive at 221/104.  ECG shows sinus rhythm with first-degree AV block and PVCs.  Chest x-ray and labs are unremarkable (troponin normal x 1-patient left prior to second evaluation).  Due to ongoing symptoms, patient scheduled today's appointment.  He is currently symptom-free.  He denies palpitations, PND, orthopnea, syncope, or early satiety.  He sometimes notes mild lower extremity swelling. Objective   Home Medications    Current Outpatient Medications  Medication Sig Dispense Refill   Cholecalciferol (VITAMIN D3) 1.25 MG (50000 UT) TABS Take 100 mcg by mouth daily.     clopidogrel (PLAVIX) 75 MG tablet Take 75 mg by mouth.     COLLAGEN PO Take 1 Scoop by mouth daily.     losartan  (COZAAR ) 50 MG tablet Take 50 mg by mouth at bedtime.     nitroGLYCERIN  (NITROSTAT ) 0.4 MG SL tablet Place 0.4 mg under the tongue every 5 (five) minutes as needed for chest pain. Reported on 12/12/2015     rosuvastatin  (CRESTOR ) 5 MG tablet Take 5 mg by mouth.     vitamin B-12 (CYANOCOBALAMIN ) 500 MCG tablet Take 1,000 mcg by mouth daily.     No  current facility-administered medications for this visit.     Physical Exam    VS:  BP (!) 150/82 (BP Location: Left Arm) Comment: did not take any meds this morning , did take some BP  meds last night  Pulse (!) 56   Ht 6' 2 (1.88 m)   Wt 242 lb 0.3 oz (109.8 kg)   SpO2 98%   BMI 31.07 kg/m  , BMI Body mass index is 31.07 kg/m. Orthostatic VS for the past 24 hrs (Last 3 readings):  BP- Lying Pulse- Lying BP- Sitting Pulse- Sitting BP- Standing at 0 minutes Pulse- Standing at 0 minutes BP- Standing at 3 minutes Pulse- Standing at 3 minutes  04/17/24 1120 150/82 56 (!) 160/91 59 165/88 62 163/81 58          GEN: Well nourished, well developed, in no acute distress. HEENT: normal. Neck: Supple, no JVD, carotid bruits, or masses. Cardiac: RRR, no murmurs, rubs, or gallops. No clubbing, cyanosis, trace bilateral ankle edema.  Radials 2+/PT 2+ and equal bilaterally.  Respiratory:  Respirations regular and unlabored, clear to auscultation bilaterally. GI: Soft,  nontender, nondistended, BS + x 4. MS: no deformity or atrophy. Skin: warm and dry, no rash. Neuro:  Strength and sensation are intact. Psych: Normal affect.  Accessory Clinical Findings    ECG personally reviewed by me today - EKG Interpretation Date/Time:  Friday April 17 2024 10:39:50 EDT Ventricular Rate:  56 PR Interval:  248 QRS Duration:  84 QT Interval:  424 QTC Calculation: 409 R Axis:   2  Text Interpretation: Sinus bradycardia with 1st degree A-V block Septal infarct (cited on or before 25-Jun-2023) Confirmed by Laneta Pintos ([id number]) on 04/17/2024 10:45:00 AM  - no acute changes.  Lab Results  Component Value Date   WBC 7.2 04/13/2024   HGB 16.0 04/13/2024   HCT 46.2 04/13/2024   MCV 94.9 04/13/2024   PLT 198 04/13/2024   Lab Results  Component Value Date   CREATININE 1.17 04/13/2024   BUN 21 04/13/2024   NA 140 04/13/2024   K 3.9 04/13/2024   CL 108 04/13/2024   CO2 26 04/13/2024   Lab  Results  Component Value Date   ALT 30 11/23/2022   AST 24 11/23/2022   ALKPHOS 53 11/23/2022   BILITOT 2.1 (H) 11/23/2022   Lab Results  Component Value Date   CHOL 237 (H) 08/02/2014   HDL 56 08/02/2014   LDLCALC 142 (H) 08/02/2014   TRIG 197 08/02/2014    Lab Results  Component Value Date   HGBA1C 5.4 08/02/2014   Lab Results  Component Value Date   TSH 2.477 03/18/2017       Assessment & Plan    1.  Coronary artery disease/unstable angina: Patient with prior history of inferior MI and RCA stenting in 2015 with nonischemic Myoview in 2021.  Over the past several months, he has been experiencing almost daily exertional lightheadedness and chest tightness occurring almost exclusively after he eats breakfast, lasting several hours, and resolving spontaneously.  He does have mild associated dyspnea at times.  Recent ED evaluation for chest tightness notable for marked hypertension, though he thinks he was just anxious, and frequent PVCs on ECG.  Troponin was normal.  In light of prior history and progression of symptoms, I will arrange for a Lexiscan PET stress test to rule out ischemia.  I will also place a 7-day ZIO monitor in setting of lightheadedness and recent PVCs on ECG (no PVCs today).  He remains on Plavix, ARB, and statin therapy.  2.  Lightheadedness: Patient with a several month history of daily lightheadedness lasting hours at a time and resolving spontaneously.  He has previously undergone imaging with MRI and CT/CTA of the brain showing cerebrovascular atherosclerosis, and old cerebellar infarcts.  He is now on Plavix and statin therapy.  He is not orthostatic by examination today.   I note that he had PVCs on twelve-lead ECG in the emergency room recently.  Question of arrhythmia may be contributing symptoms and therefore, I will arrange for a 7-day ZIO monitor to better evaluate.  3.  PVCs: Noted on twelve-lead ECG in the emergency department.  Question contribution to  lightheaded episodes.  Lab work was unremarkable in the ED.  Follow-up ZIO monitor.  He is not a candidate for beta-blocker therapy in the setting of baseline bradycardia.  4.  Primary hypertension: Blood pressure elevated today on multiple recordings.  He is not orthostatic.  He is currently on losartan  50 mg daily, which was reduced from 100 mg daily in the setting of orthostatic symptoms.  He checks  his blood pressure regularly at home and typically obtains recordings in the mid 120s to 130s.  Given symptoms and recent change in blood pressure medication with normal pressures at home, I will hold off on adjusting anything at this time.  We agreed that he will continue to follow pressures at home.  5.  Hyperlipidemia: Previously preferred to avoid lipid therapy but now taking rosuvastatin  after findings of cerebrovascular disease.  Just had lipids on June 3 with an LDL of 60.  LFTs were normal at that time.  6.  Ischemic cardiomyopathy/chronic heart failure midrange ejection fraction: EF of 45% by echo in 2021.  He has trace bilateral ankle swelling but otherwise is euvolemic.  He does not require diuretic at this time.  Pending PET stress, will consider repeating echo.  Cont ARB.  No ? blocker in setting of resting bradycardia.  Consider sglt2i +/- MRA pending EF on PET.  7.  Cerebrovascular disease: See above.  Status post recent MRI and CT/CTA of the head and neck.  Now on Plavix and statin therapy.  8.  Disposition: Follow-up ZIO monitor and stress test.  Follow-up in clinic in 1 month or sooner if necessary.  Laneta Pintos, NP 04/17/2024, 12:22 PM

## 2024-04-17 NOTE — Patient Instructions (Addendum)
 Medication Instructions:  No changes *If you need a refill on your cardiac medications before your next appointment, please call your pharmacy*  Lab Work: None ordered If you have labs (blood work) drawn today and your tests are completely normal, you will receive your results only by: MyChart Message (if you have MyChart) OR A paper copy in the mail If you have any lab test that is abnormal or we need to change your treatment, we will call you to review the results.  Testing/Procedures: CARDIAC PET- Your physician has requested that you have a Cardiac Pet Stress Test.   This testing is completed at [facility] ([address] [place], [place] [address]) or [facility] ([address] [place], [place]). Please arrive 30 minutes prior to your scheduled time.  The schedulers will call you to get this scheduled. Please follow further testing instructions below.   Follow-Up: At [facility], you and your health needs are our priority.  As part of our continuing mission to provide you with exceptional heart care, our providers are all part of one team.  This team includes your primary Cardiologist (physician) and Advanced Practice Providers or APPs (Physician Assistants and Nurse Practitioners) who all work together to provide you with the care you need, when you need it.  Your next appointment:   1 month(s)  Provider:   Timothy Gollan, MD or Laneta Pintos, NP    We recommend signing up for the patient portal called MyChart.  Sign up information is provided on this After Visit Summary.  MyChart is used to connect with patients for Virtual Visits (Telemedicine).  Patients are able to view lab/test results, encounter notes, upcoming appointments, etc.  Non-urgent messages can be sent to your provider as well.   To learn more about what you can do with MyChart, go to [email or web address].   Other Instructions    Please  report to Radiology at [facility] Main Entrance, medical mall, 30 mins prior to your test.  [address]  [place], [place]  How to Prepare for Your Cardiac PET/CT Stress Test:  Nothing to eat or drink, except water , 3 hours prior to arrival time.  NO caffeine/decaffeinated products, or chocolate 12 hours prior to arrival. (Please note decaffeinated beverages (teas/coffees) still contain caffeine).  If you have caffeine within 12 hours prior, the test will need to be rescheduled.  Medication instructions: Nothing to hold  Diabetic Preparation: If able to eat breakfast prior to 3 hour fasting, you may take all medications, including your insulin . Do not worry if you miss your breakfast dose of insulin  - start at your next meal. If you do not eat prior to 3 hour fast-Hold all diabetes (oral and insulin ) medications. Patients who wear a continuous glucose monitor MUST remove the device prior to scanning.  You may take your remaining medications with water .  NO perfume, cologne or lotion on chest or abdomen area. FEMALES - Please avoid wearing dresses to this appointment.  Total time is 1 to 2 hours; you may want to bring reading material for the waiting time.  IF YOU THINK YOU MAY BE PREGNANT, OR ARE NURSING PLEASE INFORM THE TECHNOLOGIST.  In preparation for your appointment, medication and supplies will be purchased.  Appointment availability is limited, so if you need to cancel or reschedule, please call the Radiology Department Scheduler at [phone] 24 hours in advance to avoid a cancellation fee of $100.00  What to Expect  When you Arrive:  Once you arrive and check in for your appointment, you will be taken to a preparation room within the Radiology Department.  A technologist or Nurse will obtain your medical history, verify that you are correctly prepped for the exam, and explain the procedure.  Afterwards, an IV will be started in your arm and  electrodes will be placed on your skin for EKG monitoring during the stress portion of the exam. Then you will be escorted to the PET/CT scanner.  There, staff will get you positioned on the scanner and obtain a blood pressure and EKG.  During the exam, you will continue to be connected to the EKG and blood pressure machines.  A small, safe amount of a radioactive tracer will be injected in your IV to obtain a series of pictures of your heart along with an injection of a stress agent.    After your Exam:  It is recommended that you eat a meal and drink a caffeinated beverage to counter act any effects of the stress agent.  Drink plenty of fluids for the remainder of the day and urinate frequently for the first couple of hours after the exam.  Your doctor will inform you of your test results within 7-10 business days.  For more information and frequently asked questions, please visit our website: [email or web address]  For questions about your test or how to prepare for your test, please call: Cardiac Imaging Nurse Navigators Office: [phone]  ZIO AT Long term monitor-Live Telemetry  Your physician has requested you wear a ZIO patch monitor for 7 days.  This is a single patch monitor. [employer] supplies one patch monitor per enrollment. Additional  stickers are not available.  Please do not apply patch if you will be having a Nuclear Stress Test, Echocardiogram, Cardiac CT, MRI,  or Chest Xray during the period you would be wearing the monitor. The patch cannot be worn during  these tests. You cannot remove and re-apply the ZIO AT patch monitor.  Your ZIO patch monitor will be mailed 3 day USPS to your address on file. It may take 3-5 days to  receive your monitor after you have been enrolled.  Once you have received your monitor, please review the enclosed instructions. Your monitor has  already been registered assigning a specific monitor serial # to you.   Billing and  Patient Assistance Program information  Ricardo Winters has been supplied with any insurance information on record for billing. [employer] offers a sliding scale Patient Assistance Program for patients without insurance, or whose  insurance does not completely cover the cost of the ZIO patch monitor. You must apply for the  Patient Assistance Program to qualify for the discounted rate. To apply, call [employer] at [phone],  select option 4, select option 2 , ask to apply for the Patient Assistance Program, (you can request an  interpreter if needed). [employer] will ask your household income and how many people are in your  household. [employer] will quote your out-of-pocket cost based on this information. They will also be able  to set up a 12 month interest free payment plan if needed.  Applying the monitor   Shave hair from upper left chest.  Hold the abrader disc by orange tab. Rub the abrader in 40 strokes over left upper chest as indicated in  your monitor instructions.  Clean area with 4 enclosed alcohol pads. Use all pads to ensure the area is cleaned thoroughly. Let  dry.  Apply patch  as indicated in monitor instructions. Patch will be placed under collarbone on left side of  chest with arrow pointing upward.  Rub patch adhesive wings for 2 minutes. Remove the white label marked 1. Remove the white label  marked 2. Rub patch adhesive wings for 2 additional minutes.  While looking in a mirror, press and release button in center of patch. A small green light will flash 3-4  times. This will be your only indicator that the monitor has been turned on.  Do not shower for the first 24 hours. You may shower after the first 24 hours.  Press the button if you feel a symptom. You will hear a small click. Record Date, Time and Symptom in  the Patient Log.   Starting the Gateway  In your kit there is a Audiological scientist box the size of a cellphone. This is Buyer, retail. It transmits all your  recorded  data to [facility]. This box must always stay within 10 feet of you. Open the box and push the *  button. There will be a light that blinks orange and then green a few times. When the light stops  blinking, the Gateway is connected to the ZIO patch. Call [employer] at [phone] to confirm your monitor is transmitting.  Returning your monitor  Remove your patch and place it inside the Gateway. In the lower half of the Gateway there is a white  bag with prepaid postage on it. Place Gateway in bag and seal. Mail package back to [place] as soon as  possible. Your physician should have your final report approximately 7 days after you have mailed back  your monitor. Call [facility] Customer Care at [phone] if you have questions regarding your ZIO AT  patch monitor. Call them immediately if you see an orange light blinking on your monitor.  If your monitor falls off in less than 4 days, contact our Monitor department at [phone]. If your  monitor becomes loose or falls off after 4 days call [employer] at [phone] for suggestions on  securing your monitor

## 2024-04-18 ENCOUNTER — Emergency Department

## 2024-04-18 ENCOUNTER — Other Ambulatory Visit: Payer: Self-pay

## 2024-04-18 ENCOUNTER — Emergency Department: Admission: EM | Admit: 2024-04-18 | Discharge: 2024-04-18 | Disposition: A | Discharge status: Home / Self Care

## 2024-04-18 DIAGNOSIS — I110 Hypertensive heart disease with heart failure: Secondary | ICD-10-CM | POA: Diagnosis not present

## 2024-04-18 DIAGNOSIS — Z7902 Long term (current) use of antithrombotics/antiplatelets: Secondary | ICD-10-CM | POA: Insufficient documentation

## 2024-04-18 DIAGNOSIS — I2510 Atherosclerotic heart disease of native coronary artery without angina pectoris: Secondary | ICD-10-CM | POA: Diagnosis not present

## 2024-04-18 DIAGNOSIS — R079 Chest pain, unspecified: Secondary | ICD-10-CM

## 2024-04-18 DIAGNOSIS — H538 Other visual disturbances: Secondary | ICD-10-CM | POA: Insufficient documentation

## 2024-04-18 DIAGNOSIS — I509 Heart failure, unspecified: Secondary | ICD-10-CM | POA: Insufficient documentation

## 2024-04-18 DIAGNOSIS — R072 Precordial pain: Secondary | ICD-10-CM | POA: Insufficient documentation

## 2024-04-18 LAB — CBC WITH DIFFERENTIAL/PLATELET
Abs Immature Granulocytes: 0.03 10*3/uL (ref 0.00–0.07)
Basophils Absolute: 0 10*3/uL (ref 0.0–0.1)
Basophils Relative: 0 %
Eosinophils Absolute: 0.1 10*3/uL (ref 0.0–0.5)
Eosinophils Relative: 1 %
HCT: 43.6 % (ref 39.0–52.0)
Hemoglobin: 15 g/dL (ref 13.0–17.0)
Immature Granulocytes: 0 %
Lymphocytes Relative: 23 %
Lymphs Abs: 2.2 10*3/uL (ref 0.7–4.0)
MCH: 33.2 pg (ref 26.0–34.0)
MCHC: 34.4 g/dL (ref 30.0–36.0)
MCV: 96.5 fL (ref 80.0–100.0)
Monocytes Absolute: 0.6 10*3/uL (ref 0.1–1.0)
Monocytes Relative: 7 %
Neutro Abs: 6.4 10*3/uL (ref 1.7–7.7)
Neutrophils Relative %: 69 %
Platelets: 171 10*3/uL (ref 150–400)
RBC: 4.52 MIL/uL (ref 4.22–5.81)
RDW: 13 % (ref 11.5–15.5)
WBC: 9.3 10*3/uL (ref 4.0–10.5)
nRBC: 0 % (ref 0.0–0.2)

## 2024-04-18 LAB — PROTIME-INR
INR: 1 (ref 0.8–1.2)
Prothrombin Time: 13 s (ref 11.4–15.2)

## 2024-04-18 LAB — LIPASE, BLOOD: Lipase: 33 U/L (ref 11–51)

## 2024-04-18 LAB — COMPREHENSIVE METABOLIC PANEL WITH GFR
ALT: 23 U/L (ref 0–44)
AST: 17 U/L (ref 15–41)
Albumin: 3.6 g/dL (ref 3.5–5.0)
Alkaline Phosphatase: 58 U/L (ref 38–126)
Anion gap: 7 (ref 5–15)
BUN: 27 mg/dL — ABNORMAL HIGH (ref 8–23)
CO2: 24 mmol/L (ref 22–32)
Calcium: 8.8 mg/dL — ABNORMAL LOW (ref 8.9–10.3)
Chloride: 110 mmol/L (ref 98–111)
Creatinine, Ser: 1.12 mg/dL (ref 0.61–1.24)
GFR, Estimated: >60 mL/min (ref >60)
Glucose, Bld: 101 mg/dL — ABNORMAL HIGH (ref 70–99)
Potassium: 3.5 mmol/L (ref 3.5–5.1)
Sodium: 141 mmol/L (ref 135–145)
Total Bilirubin: 1.6 mg/dL — ABNORMAL HIGH (ref 0.0–1.2)
Total Protein: 5.9 g/dL — ABNORMAL LOW (ref 6.5–8.1)

## 2024-04-18 LAB — TROPONIN I (HIGH SENSITIVITY)
Troponin I (High Sensitivity): 8 ng/L (ref <18)
Troponin I (High Sensitivity): 8 ng/L (ref <18)

## 2024-04-18 MED ORDER — FAMOTIDINE 20 MG PO TABS: 20.0000 mg | ORAL_TABLET | Freq: Two times a day (BID) | ORAL | 0 refills | Status: AC

## 2024-04-18 NOTE — Discharge Instructions (Signed)
 Your evaluation in the emergency department was overall reassuring.  I do agree it is important to continue with the plan for a stress test of your heart and the Zio patch monitor.  I have also prescribed you an antacid medication as you may be having acid reflux contributing to your symptoms.  Please do follow-up closely with your primary care provider and cardiologist for reevaluation, and return to the emergency department with any new or worsening symptoms.

## 2024-04-18 NOTE — ED Triage Notes (Signed)
 Pt BIB [facility] from home c/o centralized chest pain that has been off and on for a month. Pt reports its tightness. Pt was seen 4 days ao for the same and was cleared. Pt saw his cardiologist and was cleared by them also. Pt endorses vision changes such as brief blurriness. Pt denies N/V and endorses some dizziness.  153/94 74 HR 94RA 1 Nitro

## 2024-04-18 NOTE — ED Provider Notes (Signed)
 [facility] Provider Note    Event Date/Time   First MD Initiated Contact with Patient 04/18/24 1212     (approximate)   History   Chest Pain  Pt BIB [facility] from home c/o centralized chest pain that has been off and on for a month. Pt reports its tightness. Pt was seen 4 days ao for the same and was cleared. Pt saw his cardiologist and was cleared by them also. Pt endorses vision changes such as brief blurriness. Pt denies N/V and endorses some dizziness.  153/94 74 HR 94RA 1 Nitro   HPI Ricardo Winters. is a 78 y.o. male PMH CAD s/p PCI (2015) on plavix, prior CVA, ischemic cardiomyopathy, CHF, hypertension, hyperlipidemia prior DVT, prior duodenal ulcer presents for evaluation of chest discomfort - Patient states that about 30 minutes after eating breakfast this morning he developed epigastric and substernal discomfort, pressure.  Also had lightheadedness.  Episode lasted about 2-2.5.  Has been having similar symptoms regularly over the past 2 months but feels they are worsening, called an ambulance today due to ongoing symptoms.  Did appear to improve with Tums and nitroglycerin  - Has not been having any exertional symptoms - States these episodes only appear to happen in the morning after eating breakfast - Currently symptomatic.  No leg pain or swelling.No    Per chart review, patient was seen in emergency department on 04/13/2024 after complaining of chest pain.  Workup unremarkable including troponin, plan for outpatient cardiology follow-up.  Subsequently seen by cardiology in clinic yesterday --ordered for Lexiscan stress test to rule out ischemia and also a 7-day Zio patch to evaluate for underlying arrhythmia.       Physical Exam   Triage Vital Signs: BP (!) 154/89 (BP Location: Left Arm)   Pulse (!) 58   Temp 98.3 F (36.8 C) (Oral)   Resp 18   Ht 6' 1 (1.854 m)   Wt 108.9 kg   SpO2 96%   BMI 31.66 kg/m     Most recent vital  signs: Vitals:   04/18/24 1300  BP: (!) 154/89  Pulse: (!) 58  Resp: 18  Temp: 98.3 F (36.8 C)  SpO2: 96%     General: Awake, no distress.  CV:  Good peripheral perfusion. RRR, RP 2+ Resp:  Normal effort. CTAB Abd:  No distention. Nontender to deep palpation throughout    ED Results / Procedures / Treatments   Labs (all labs ordered are listed, but only abnormal results are displayed) Labs Reviewed  COMPREHENSIVE METABOLIC PANEL WITH GFR - Abnormal; Notable for the following components:      Result Value   Glucose, Bld 101 (*)    BUN 27 (*)    Calcium  8.8 (*)    Total Protein 5.9 (*)    Total Bilirubin 1.6 (*)    All other components within normal limits  CBC WITH DIFFERENTIAL/PLATELET  LIPASE, BLOOD  PROTIME-INR  TROPONIN I (HIGH SENSITIVITY)  TROPONIN I (HIGH SENSITIVITY)     EKG  Ecg = sinus rhythm, rate 70, no ST elevation or depression, no significant repolarization abnormality, normal axis, first-degree AV block present.  No clear evidence of ischemia nor arrhythmia on my interpretation.   RADIOLOGY Radiology interpreted by myself and radiology report reviewed.    PROCEDURES:  Critical Care performed: No  Procedures   MEDICATIONS ORDERED IN ED: Medications - No data to display   IMPRESSION / MDM / ASSESSMENT AND PLAN / ED COURSE  I reviewed the triage vital signs and the nursing notes.                              DDX/MDM/AP: Differential diagnosis includes, but is not limited to, ACS, GERD/dyspepsia, do not clinically suspect PE, no evidence of CHF exacerbation at this time.  Highly doubt acute intra-abdominal pathology with very benign abdominal exam here.  Plan: - Labs - EKG - Chest x-ray - Reassess  Patient's presentation is most consistent with acute presentation with potential threat to life or bodily function.  The patient is on the cardiac monitor to evaluate for evidence of arrhythmia and/or significant heart rate  changes.  ED course below.  Workup unremarkable.  Serial troponins normal, stable.  In shared decision making, patient prefers discharge home as opposed to admission for expedited cardiac evaluation, will proceed with current plan for outpatient stress test.  No particular ischemic findings at this time here.  Do suspect some component of dyspepsia may be contributing, Rx famotidine.  Plan for PMD and cardiology follow-up.  ED return precautions in place.  Patient agrees with plan.  Clinical Course as of 04/18/24 1455  Sat Apr 18, 2024  1302 Chest x-ray reviewed, unremarkable my interpretation [MM]  1302 CMP reviewed, overall unremarkable.  Bilirubin mildly elevated, LFTs otherwise normal. [MM]  1415 Initial trop wnl [MM]  1445 Rpt trop stable [MM]  1449 Patient reevaluated, reassured by repeat troponin.  Did offer admission for expedited heart testing given his current outpatient plan for stress test and elevated age and comorbidities at baseline though patient declines and prefers to follow-up outpatient.  I do have high suspicion for possible underlying dyspepsia as etiology of presentation given recurrence of symptoms only in the morning after eating.  Will start on famotidine.  Plan for cardiology and PMD follow-up.  ED return precautions in place.  Patient agrees with plan. [MM]    Clinical Course User Index [MM] Clarine Ozell LABOR, MD     FINAL CLINICAL IMPRESSION(S) / ED DIAGNOSES   Final diagnoses:  Nonspecific chest pain     Rx / DC Orders   ED Discharge Orders          Ordered    famotidine (PEPCID) 20 MG tablet  2 times daily        04/18/24 1454             Note:  This document was prepared using Dragon voice recognition software and may include unintentional dictation errors.   Clarine Ozell LABOR, MD 04/18/24 1455

## 2024-04-21 ENCOUNTER — Other Ambulatory Visit: Payer: Self-pay | Admitting: Gastroenterology

## 2024-04-21 DIAGNOSIS — Z8719 Personal history of other diseases of the digestive system: Secondary | ICD-10-CM

## 2024-04-21 DIAGNOSIS — K30 Functional dyspepsia: Secondary | ICD-10-CM

## 2024-04-21 DIAGNOSIS — K449 Diaphragmatic hernia without obstruction or gangrene: Secondary | ICD-10-CM

## 2024-04-21 DIAGNOSIS — R0789 Other chest pain: Secondary | ICD-10-CM

## 2024-04-22 ENCOUNTER — Other Ambulatory Visit: Payer: Self-pay | Admitting: Gastroenterology

## 2024-04-22 DIAGNOSIS — K449 Diaphragmatic hernia without obstruction or gangrene: Secondary | ICD-10-CM

## 2024-04-22 DIAGNOSIS — K30 Functional dyspepsia: Secondary | ICD-10-CM

## 2024-04-22 DIAGNOSIS — R0789 Other chest pain: Secondary | ICD-10-CM

## 2024-04-22 DIAGNOSIS — Z8719 Personal history of other diseases of the digestive system: Secondary | ICD-10-CM

## 2024-04-27 ENCOUNTER — Ambulatory Visit: Admission: RE | Admit: 2024-04-27 | Source: Ambulatory Visit

## 2024-04-27 ENCOUNTER — Ambulatory Visit
Admission: RE | Admit: 2024-04-27 | Discharge: 2024-04-27 | Disposition: A | Discharge status: Home / Self Care | Source: Ambulatory Visit | Attending: Gastroenterology

## 2024-04-27 ENCOUNTER — Ambulatory Visit
Admission: RE | Admit: 2024-04-27 | Discharge: 2024-04-27 | Disposition: A | Discharge status: Home / Self Care | Source: Ambulatory Visit | Attending: Gastroenterology | Admitting: Gastroenterology

## 2024-04-27 DIAGNOSIS — K30 Functional dyspepsia: Secondary | ICD-10-CM

## 2024-04-27 DIAGNOSIS — Z8719 Personal history of other diseases of the digestive system: Secondary | ICD-10-CM | POA: Diagnosis present

## 2024-04-27 DIAGNOSIS — R0789 Other chest pain: Secondary | ICD-10-CM

## 2024-04-27 DIAGNOSIS — K449 Diaphragmatic hernia without obstruction or gangrene: Secondary | ICD-10-CM | POA: Diagnosis present

## 2024-04-27 MED ORDER — IOHEXOL 300 MG/ML  SOLN
75.0000 mL | Freq: Once | INTRAMUSCULAR | Status: AC | PRN
  Administered 2024-04-27: 75 mL via INTRAVENOUS

## 2024-05-07 ENCOUNTER — Ambulatory Visit: Admitting: Nurse Practitioner

## 2024-06-08 ENCOUNTER — Encounter (HOSPITAL_COMMUNITY): Payer: Self-pay

## 2024-06-11 ENCOUNTER — Ambulatory Visit: Payer: Self-pay | Admitting: Nurse Practitioner

## 2024-06-11 ENCOUNTER — Ambulatory Visit
Admission: RE | Admit: 2024-06-11 | Discharge: 2024-06-11 | Disposition: A | Discharge status: Home / Self Care | Source: Ambulatory Visit | Attending: Nurse Practitioner | Admitting: Nurse Practitioner

## 2024-06-11 DIAGNOSIS — I25118 Atherosclerotic heart disease of native coronary artery with other forms of angina pectoris: Secondary | ICD-10-CM | POA: Diagnosis present

## 2024-06-11 LAB — NM PET CT CARDIAC PERFUSION MULTI W/ABSOLUTE BLOODFLOW
MBFR: 2.96
Nuc Rest EF: 56 %
Nuc Stress EF: 63 %
Peak HR: 69 {beats}/min
Rest HR: 54 {beats}/min
Rest MBF: 0.74 ml/g/min
Rest Nuclear Isotope Dose: 22.7 mCi
SRS: 0
SSS: 0
ST Depression (mm): 0 mm
Stress MBF: 2.19 ml/g/min
Stress Nuclear Isotope Dose: 22.8 mCi
TID: 1.01

## 2024-06-11 MED ORDER — REGADENOSON 0.4 MG/5ML IV SOLN
0.4000 mg | Freq: Once | INTRAVENOUS | Status: AC
  Administered 2024-06-11: 0.4 mg via INTRAVENOUS
  Filled 2024-06-11: qty 5

## 2024-06-11 MED ORDER — RUBIDIUM RB82 GENERATOR (RUBYFILL)
25.0000 | PACK | Freq: Once | INTRAVENOUS | Status: AC
  Administered 2024-06-11: 22.77 via INTRAVENOUS

## 2024-06-11 MED ORDER — CAFFEINE CITRATE BASE COMPONENT 10 MG/ML IV SOLN
INTRAVENOUS | Status: AC
  Filled 2024-06-11: qty 3

## 2024-06-11 MED ORDER — DEXTROSE 5 % IV SOLN
INTRAVENOUS | Status: AC
  Filled 2024-06-11: qty 50

## 2024-06-11 MED ORDER — REGADENOSON 0.4 MG/5ML IV SOLN
INTRAVENOUS | Status: AC
  Filled 2024-06-11: qty 5

## 2024-06-11 MED ORDER — RUBIDIUM RB82 GENERATOR (RUBYFILL)
25.0000 | PACK | Freq: Once | INTRAVENOUS | Status: AC
  Administered 2024-06-11: 22.67 via INTRAVENOUS

## 2024-06-11 NOTE — Progress Notes (Signed)
 Patient presents for a cardiac PET stress test and tolerated procedure without incident. Patient maintained acceptable vital signs throughout the test and was offered caffeine  after test.  Patient ambulated out of department with a steady gait.

## 2024-06-26 ENCOUNTER — Ambulatory Visit: Admitting: Cardiovascular Disease

## 2024-07-15 ENCOUNTER — Other Ambulatory Visit: Payer: Self-pay | Admitting: Sports Medicine

## 2024-07-15 DIAGNOSIS — M25461 Effusion, right knee: Secondary | ICD-10-CM

## 2024-07-15 DIAGNOSIS — G8929 Other chronic pain: Secondary | ICD-10-CM

## 2024-07-15 DIAGNOSIS — M76891 Other specified enthesopathies of right lower limb, excluding foot: Secondary | ICD-10-CM

## 2024-07-16 ENCOUNTER — Ambulatory Visit
Admission: RE | Admit: 2024-07-16 | Discharge: 2024-07-16 | Disposition: A | Discharge status: Home / Self Care | Source: Ambulatory Visit | Attending: Sports Medicine | Admitting: Sports Medicine

## 2024-07-16 DIAGNOSIS — G8929 Other chronic pain: Secondary | ICD-10-CM | POA: Insufficient documentation

## 2024-07-16 DIAGNOSIS — M76891 Other specified enthesopathies of right lower limb, excluding foot: Secondary | ICD-10-CM | POA: Diagnosis present

## 2024-07-16 DIAGNOSIS — M25461 Effusion, right knee: Secondary | ICD-10-CM | POA: Diagnosis present

## 2024-07-16 DIAGNOSIS — M25561 Pain in right knee: Secondary | ICD-10-CM | POA: Insufficient documentation

## 2024-08-04 ENCOUNTER — Ambulatory Visit (INDEPENDENT_AMBULATORY_CARE_PROVIDER_SITE_OTHER): Admitting: Dermatology

## 2024-08-04 ENCOUNTER — Encounter: Payer: Self-pay | Admitting: Dermatology

## 2024-08-04 DIAGNOSIS — D229 Melanocytic nevi, unspecified: Secondary | ICD-10-CM

## 2024-08-04 DIAGNOSIS — L578 Other skin changes due to chronic exposure to nonionizing radiation: Secondary | ICD-10-CM | POA: Diagnosis not present

## 2024-08-04 DIAGNOSIS — Z1283 Encounter for screening for malignant neoplasm of skin: Secondary | ICD-10-CM | POA: Diagnosis not present

## 2024-08-04 DIAGNOSIS — W908XXA Exposure to other nonionizing radiation, initial encounter: Secondary | ICD-10-CM

## 2024-08-04 DIAGNOSIS — Z86006 Personal history of melanoma in-situ: Secondary | ICD-10-CM

## 2024-08-04 DIAGNOSIS — L814 Other melanin hyperpigmentation: Secondary | ICD-10-CM | POA: Diagnosis not present

## 2024-08-04 DIAGNOSIS — D1801 Hemangioma of skin and subcutaneous tissue: Secondary | ICD-10-CM

## 2024-08-04 DIAGNOSIS — L821 Other seborrheic keratosis: Secondary | ICD-10-CM | POA: Diagnosis not present

## 2024-08-04 DIAGNOSIS — Z85828 Personal history of other malignant neoplasm of skin: Secondary | ICD-10-CM

## 2024-08-04 NOTE — Progress Notes (Signed)
   Follow-Up Visit   Subjective  Ricardo Winters. is a 78 y.o. male who presents for the following: Skin Cancer Screening and Full Body Skin Exam  The patient presents for Total-Body Skin Exam (TBSE) for skin cancer screening and mole check. The patient has spots, moles and lesions to be evaluated, some may be new or changing and the patient may have concern these could be cancer.  Hx BCC, MMis. Patient with a spot at right nose that has been treated with LN2 but has not resolved.   The following portions of the chart were reviewed this encounter and updated as appropriate: medications, allergies, medical history  Review of Systems:  No other skin or systemic complaints except as noted in HPI or Assessment and Plan.  Objective  Well appearing patient in no apparent distress; mood and affect are within normal limits.  A full examination was performed including scalp, head, eyes, ears, nose, lips, neck, chest, axillae, abdomen, back, buttocks, bilateral upper extremities, bilateral lower extremities, hands, feet, fingers, toes, fingernails, and toenails. All findings within normal limits unless otherwise noted below.   Relevant physical exam findings are noted in the Assessment and Plan.    Assessment & Plan   SKIN CANCER SCREENING PERFORMED TODAY.  ACTINIC DAMAGE - Chronic condition, secondary to cumulative UV/sun exposure - diffuse scaly erythematous macules with underlying dyspigmentation - Recommend daily broad spectrum sunscreen SPF 30+ to sun-exposed areas, reapply every 2 hours as needed.  - Staying in the shade or wearing long sleeves, sun glasses (UVA+UVB protection) and wide brim hats (4-inch brim around the entire circumference of the hat) are also recommended for sun protection.  - Call for new or changing lesions.  LENTIGINES, SEBORRHEIC KERATOSES, HEMANGIOMAS - Benign normal skin lesions - Benign-appearing - Call for any changes  MELANOCYTIC NEVI - Tan-brown  and/or pink-flesh-colored symmetric macules and papules - Benign appearing on exam today - Observation - Call clinic for new or changing moles - Recommend daily use of broad spectrum spf 30+ sunscreen to sun-exposed areas.   HISTORY OF BASAL CELL CARCINOMA OF THE SKIN - Right upper back. BCC with moderately atypical dysplastic nevus. Excision 12/18/23  - No evidence of recurrence today - Recommend regular full body skin exams - Recommend daily broad spectrum sunscreen SPF 30+ to sun-exposed areas, reapply every 2 hours as needed.  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 Mohs 09/16/23 Dr Gregorio  - No evidence of recurrence today - Recommend regular full body skin exams - Recommend daily broad spectrum sunscreen SPF 30+ to sun-exposed areas, reapply every 2 hours as needed.  - Call if any new or changing lesions are noted between office visits    MULTIPLE BENIGN NEVI   LENTIGINES   ACTINIC ELASTOSIS   SEBORRHEIC KERATOSES   CHERRY ANGIOMA   Return in about 3 months (around 11/04/2024) for TBSE, with Dr. Claudene, [facility], HxMMis.  LILLETTE Lonell Drones, RMA, am acting as scribe for Boneta Claudene, MD .   Documentation: I have reviewed the above documentation for accuracy and completeness, and I agree with the above.  Boneta Claudene, MD

## 2024-08-04 NOTE — Patient Instructions (Signed)

## 2024-09-17 ENCOUNTER — Ambulatory Visit: Admitting: Dermatology

## 2024-09-21 ENCOUNTER — Ambulatory Visit: Admitting: Dermatology

## 2024-10-17 ENCOUNTER — Emergency Department

## 2024-10-17 ENCOUNTER — Emergency Department
Admission: EM | Admit: 2024-10-17 | Discharge: 2024-10-17 | Disposition: A | Discharge status: Home / Self Care | Attending: Emergency Medicine | Admitting: Emergency Medicine

## 2024-10-17 ENCOUNTER — Other Ambulatory Visit: Payer: Self-pay

## 2024-10-17 DIAGNOSIS — S46912A Strain of unspecified muscle, fascia and tendon at shoulder and upper arm level, left arm, initial encounter: Secondary | ICD-10-CM | POA: Insufficient documentation

## 2024-10-17 DIAGNOSIS — X500XXA Overexertion from strenuous movement or load, initial encounter: Secondary | ICD-10-CM | POA: Insufficient documentation

## 2024-10-17 DIAGNOSIS — M25512 Pain in left shoulder: Secondary | ICD-10-CM | POA: Diagnosis present

## 2024-10-17 MED ORDER — TRAMADOL HCL 50 MG PO TABS: 50.0000 mg | ORAL_TABLET | Freq: Four times a day (QID) | ORAL | 0 refills | Status: DC | PRN

## 2024-10-17 NOTE — Discharge Instructions (Signed)
 Please follow up with orthopedics.  Wear the sling and apply ice 20 minutes off/on throughout the day. You should take the sling off occasionally and perform gentle range of motion to prevent stiffness/frozen shoulder.  Take the meloxicam as we discussed. Take tramadol  if tylenol  or meloxicam does not help. Be aware the tramadol  may make you drowsy or dizzy. Do not drive or use machinery for at least 8 hours after the last dose.

## 2024-10-17 NOTE — ED Provider Notes (Signed)
" ° °  [facility] Provider Note    Event Date/Time   First MD Initiated Contact with Patient 10/17/24 1251     (approximate)   History   Shoulder Pain   HPI  Ricardo Vanwyk. is a 78 y.o. male with history of DVT, duodenal ulcer, MI, hyperlipidemia and as listed in EMR presents to the emergency department for treatment and evaluation of left shoulder pain.  Few days ago he lifted something heavy and believes that he pulled or tore something in the shoulder.  Pain increases with attempts to abduct or externally rotate.   Physical Exam    Vitals:   10/17/24 1141  BP: (!) 175/95  Pulse: 67  Resp: 18  Temp: 98 F (36.7 C)  SpO2: 98%    General: Awake, no distress.  CV:  Good peripheral perfusion.  Resp:  Normal effort.  Abd:  No distention.  Other:  Pain in anterior shoulder with attempt to abduct and externally rotate. No step off deformity   ED Results / Procedures / Treatments   Labs (all labs ordered are listed, but only abnormal results are displayed)  Labs Reviewed - No data to display   EKG  Not indicated.   RADIOLOGY  Image and radiology report reviewed and interpreted by me. Radiology report consistent with the same.  X-ray image of the left shoulder shows no acute fracture or dislocation.  Mild degenerative changes are noted.  PROCEDURES:  Critical Care performed: No  Procedures   MEDICATIONS ORDERED IN ED:  Medications - No data to display   IMPRESSION / MDM / ASSESSMENT AND PLAN / ED COURSE   I have reviewed the triage note and vital signs. Vital signs are stable   Differential diagnosis includes, but is not limited to, shoulder strain, rotator cuff injury  Patient's presentation is most consistent with acute illness / injury with system symptoms.  78 year old male presenting to the emergency department for treatment and evaluation of anterior shoulder pain that started after lifting something heavy.  See  HPI for further details.  X-ray shows mild degenerative change but no acute bony abnormality.  Patient placed in a sling and encouraged to call his orthopedic specialist.  He will take meloxicam as previously prescribed.  Tramadol  submitted to patient's pharmacy as well.  Home care discussed and patient will remove the sling a few times per day and perform some gentle range of motion to prevent stiffness/frozen shoulder.  ER return precautions discussed.      FINAL CLINICAL IMPRESSION(S) / ED DIAGNOSES   Final diagnoses:  Shoulder strain, left, initial encounter     Rx / DC Orders   ED Discharge Orders          Ordered    traMADol  (ULTRAM ) 50 MG tablet  Every 6 hours PRN        10/17/24 1253             Note:  This document was prepared using Dragon voice recognition software and may include unintentional dictation errors.   Herlinda Kirk NOVAK, FNP 10/17/24 1645  "

## 2024-10-17 NOTE — ED Triage Notes (Signed)
 Pt to ED via POV from home. Pt reports a few days ago lifting something heavy and now having pain in left shoulder. Pain 10/10.

## 2024-10-19 ENCOUNTER — Telehealth: Payer: Self-pay | Admitting: Cardiovascular Disease

## 2024-10-19 NOTE — Telephone Encounter (Signed)
" °*  STAT* If patient is at the pharmacy, call can be transferred to refill team.   1. Which medications need to be refilled? (please list name of each medication and dose if known) losartan  (COZAAR ) 50 MG tablet    4. Which pharmacy/location (including street and city if local pharmacy) is medication to be sent to?  [id number] [place], [place] - [address] AT [facility] OF [address] [facility] Phone: [phone]  Fax: [phone]       5. Do they need a 30 day or 90 day supply? 90   "

## 2024-10-20 ENCOUNTER — Other Ambulatory Visit (HOSPITAL_COMMUNITY): Payer: Self-pay

## 2024-10-20 ENCOUNTER — Other Ambulatory Visit: Payer: Self-pay | Admitting: Sports Medicine

## 2024-10-20 DIAGNOSIS — M7582 Other shoulder lesions, left shoulder: Secondary | ICD-10-CM

## 2024-10-20 DIAGNOSIS — G8929 Other chronic pain: Secondary | ICD-10-CM

## 2024-10-20 DIAGNOSIS — M7552 Bursitis of left shoulder: Secondary | ICD-10-CM

## 2024-10-20 DIAGNOSIS — M7542 Impingement syndrome of left shoulder: Secondary | ICD-10-CM

## 2024-10-20 DIAGNOSIS — M19012 Primary osteoarthritis, left shoulder: Secondary | ICD-10-CM

## 2024-10-20 MED ORDER — LOSARTAN POTASSIUM 50 MG PO TABS
50.0000 mg | ORAL_TABLET | Freq: Every day | ORAL | 0 refills | Status: AC
  Filled 2024-10-20: qty 30, 30d supply, fill #0

## 2024-10-20 NOTE — Telephone Encounter (Signed)
 Call placed to the patient. Appointment made for 11/03/24 with Dr. Gollan. The patient stated that he did not wear the monitor and had mailed it back.

## 2024-10-21 ENCOUNTER — Ambulatory Visit
Admission: RE | Admit: 2024-10-21 | Discharge: 2024-10-21 | Disposition: A | Discharge status: Home / Self Care | Source: Ambulatory Visit | Attending: Sports Medicine | Admitting: Sports Medicine

## 2024-10-21 DIAGNOSIS — M7582 Other shoulder lesions, left shoulder: Secondary | ICD-10-CM | POA: Diagnosis present

## 2024-10-21 DIAGNOSIS — M25512 Pain in left shoulder: Secondary | ICD-10-CM | POA: Insufficient documentation

## 2024-10-21 DIAGNOSIS — M19012 Primary osteoarthritis, left shoulder: Secondary | ICD-10-CM | POA: Diagnosis present

## 2024-10-21 DIAGNOSIS — M7552 Bursitis of left shoulder: Secondary | ICD-10-CM | POA: Insufficient documentation

## 2024-10-21 DIAGNOSIS — M7542 Impingement syndrome of left shoulder: Secondary | ICD-10-CM | POA: Insufficient documentation

## 2024-10-21 DIAGNOSIS — G8929 Other chronic pain: Secondary | ICD-10-CM | POA: Diagnosis present

## 2024-10-30 ENCOUNTER — Other Ambulatory Visit (HOSPITAL_COMMUNITY): Payer: Self-pay

## 2024-11-03 ENCOUNTER — Encounter: Payer: Self-pay | Admitting: Dermatology

## 2024-11-03 ENCOUNTER — Ambulatory Visit: Admitting: Cardiovascular Disease

## 2024-11-03 ENCOUNTER — Ambulatory Visit: Admitting: Dermatology

## 2024-11-03 DIAGNOSIS — D2262 Melanocytic nevi of left upper limb, including shoulder: Secondary | ICD-10-CM

## 2024-11-03 DIAGNOSIS — D034 Melanoma in situ of scalp and neck: Secondary | ICD-10-CM

## 2024-11-03 DIAGNOSIS — D239 Other benign neoplasm of skin, unspecified: Secondary | ICD-10-CM

## 2024-11-03 DIAGNOSIS — D485 Neoplasm of uncertain behavior of skin: Secondary | ICD-10-CM

## 2024-11-03 DIAGNOSIS — L820 Inflamed seborrheic keratosis: Secondary | ICD-10-CM

## 2024-11-03 DIAGNOSIS — L578 Other skin changes due to chronic exposure to nonionizing radiation: Secondary | ICD-10-CM

## 2024-11-03 DIAGNOSIS — W908XXA Exposure to other nonionizing radiation, initial encounter: Secondary | ICD-10-CM

## 2024-11-03 DIAGNOSIS — D225 Melanocytic nevi of trunk: Secondary | ICD-10-CM | POA: Diagnosis not present

## 2024-11-03 DIAGNOSIS — D1801 Hemangioma of skin and subcutaneous tissue: Secondary | ICD-10-CM

## 2024-11-03 DIAGNOSIS — D229 Melanocytic nevi, unspecified: Secondary | ICD-10-CM

## 2024-11-03 DIAGNOSIS — Z1283 Encounter for screening for malignant neoplasm of skin: Secondary | ICD-10-CM | POA: Diagnosis not present

## 2024-11-03 DIAGNOSIS — L821 Other seborrheic keratosis: Secondary | ICD-10-CM

## 2024-11-03 DIAGNOSIS — L814 Other melanin hyperpigmentation: Secondary | ICD-10-CM | POA: Diagnosis not present

## 2024-11-03 DIAGNOSIS — L570 Actinic keratosis: Secondary | ICD-10-CM | POA: Diagnosis not present

## 2024-11-03 NOTE — Progress Notes (Signed)
 "  Follow-Up Visit   Subjective  Ricardo Winters. is a 79 y.o. male who presents for the following: Skin Cancer Screening and Full Body Skin Exam  The patient presents for Total-Body Skin Exam (TBSE) for skin cancer screening and mole check. The patient has spots, moles and lesions to be evaluated, some may be new or changing and the patient may have concern these could be cancer.  Hx MIS, BCC. Patient with one spot at right thigh that feels rough, would like to treat.   The following portions of the chart were reviewed this encounter and updated as appropriate: medications, allergies, medical history  Review of Systems:  No other skin or systemic complaints except as noted in HPI or Assessment and Plan.  Objective  Well appearing patient in no apparent distress; mood and affect are within normal limits.  A full examination was performed including scalp, head, eyes, ears, nose, lips, neck, chest, axillae, abdomen, back, buttocks, bilateral upper extremities, bilateral lower extremities, hands, feet, fingers, toes, fingernails, and toenails. All findings within normal limits unless otherwise noted below.   Relevant physical exam findings are noted in the Assessment and Plan.  R lat thigh x 1 Erythematous stuck-on, waxy papule or plaque left parietal scalp 7 mm irregular pigmented macule  left proximal lateral forearm 6 mm pigmented macule with dark focus at 3 o'clock  left lower flank 6 x 3 mm pigmented macule    Assessment & Plan   SKIN CANCER SCREENING PERFORMED TODAY.  ACTINIC DAMAGE - Chronic condition, secondary to cumulative UV/sun exposure - diffuse scaly erythematous macules with underlying dyspigmentation - Recommend daily broad spectrum sunscreen SPF 30+ to sun-exposed areas, reapply every 2 hours as needed.  - Staying in the shade or wearing long sleeves, sun glasses (UVA+UVB protection) and wide brim hats (4-inch brim around the entire circumference of the hat)  are also recommended for sun protection.  - Call for new or changing lesions.  LENTIGINES, SEBORRHEIC KERATOSES, HEMANGIOMAS - Benign normal skin lesions - Benign-appearing - Call for any changes  MELANOCYTIC NEVI - Tan-brown and/or pink-flesh-colored symmetric macules and papules - Benign appearing on exam today - Observation - Call clinic for new or changing moles - Recommend daily use of broad spectrum spf 30+ sunscreen to sun-exposed areas.    HISTORY OF BASAL CELL CARCINOMA OF THE SKIN - Right upper back. BCC with moderately atypical dysplastic nevus. Excision 12/18/23  - No evidence of recurrence today - Recommend regular full body skin exams - Recommend daily broad spectrum sunscreen SPF 30+ to sun-exposed areas, reapply every 2 hours as needed.  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 Mohs 09/16/23 Dr Gregorio  - No evidence of recurrence today - Recommend regular full body skin exams - Recommend daily broad spectrum sunscreen SPF 30+ to sun-exposed areas, reapply every 2 hours as needed.  - Call if any new or changing lesions are noted between office visits  DERMATOFIBROMA Exam: Firm pink/brown papulenodule with dimple sign.  Treatment Plan: A dermatofibroma is a benign growth possibly related to trauma, such as an insect bite, cut from shaving, or inflamed acne-type bump.  Treatment options to remove include shave or excision with resulting scar and risk of recurrence.  Since benign-appearing and not bothersome, will observe for now.   INFLAMED SEBORRHEIC KERATOSIS R lat thigh x 1 Symptomatic, irritating, patient would like treated.  Benign-appearing.  Call clinic for new or changing lesions.   - Destruction of lesion - R lat thigh x 1 Complexity: simple   Destruction method: cryotherapy   Informed consent: discussed and consent obtained   Timeout:  patient name, date of  birth, surgical site, and procedure verified Lesion destroyed using liquid nitrogen: Yes   Region frozen until ice ball extended beyond lesion: Yes   Cryo cycles: 1 or 2. Outcome: patient tolerated procedure well with no complications   Post-procedure details: wound care instructions given    NEOPLASM OF UNCERTAIN BEHAVIOR OF SKIN (3) left parietal scalp - Epidermal / dermal shaving  Lesion diameter (cm):  0.7 Informed consent: discussed and consent obtained   Timeout: patient name, date of birth, surgical site, and procedure verified   Procedure prep:  Patient was prepped and draped in usual sterile fashion Prep type:  Isopropyl alcohol Anesthesia: the lesion was anesthetized in a standard fashion   Anesthetic:  1% lidocaine  w/ epinephrine  1-100,000 buffered w/ 8.4% NaHCO3 Instrument used: DermaBlade   Hemostasis achieved with: pressure and aluminum chloride    Outcome: patient tolerated procedure well   Post-procedure details: wound care instructions given    - Epidermal / dermal shaving  Lesion diameter (cm):  0.7 Informed consent: discussed and consent obtained   Timeout: patient name, date of birth, surgical site, and procedure verified   Procedure prep:  Patient was prepped and draped in usual sterile fashion Prep type:  Isopropyl alcohol Anesthesia: the lesion was anesthetized in a standard fashion   Anesthetic:  1% lidocaine  w/ epinephrine  1-100,000 buffered w/ 8.4% NaHCO3 Instrument used: DermaBlade   Hemostasis achieved with: pressure and aluminum chloride    Outcome: patient tolerated procedure well   Post-procedure details: wound care instructions given    Specimen 1 - Surgical pathology Differential Diagnosis: Dysplastic Nevus vs Melanoma  Check Margins: No left proximal lateral forearm - Epidermal / dermal shaving  Lesion diameter (cm):  0.6  Specimen 2 - Surgical pathology Differential Diagnosis: Dysplastic Nevus vs Melanoma   Check Margins: No left  lower flank - Epidermal / dermal shaving  Lesion diameter (cm):  0.6 Informed consent: discussed and consent obtained   Timeout: patient name, date of birth, surgical site, and procedure verified   Procedure prep:  Patient was prepped and draped in usual sterile fashion Prep type:  Isopropyl alcohol Anesthesia: the lesion was anesthetized in a standard fashion   Anesthetic:  1% lidocaine  w/ epinephrine  1-100,000 buffered w/ 8.4% NaHCO3 Instrument used: DermaBlade   Hemostasis achieved with: pressure and aluminum chloride    Outcome: patient tolerated procedure well   Post-procedure details: wound care instructions given    Specimen 3 - Surgical pathology Differential Diagnosis: Dysplastic Nevus vs Melanoma   Check Margins: No MULTIPLE BENIGN NEVI   LENTIGINES   SEBORRHEIC KERATOSES   CHERRY ANGIOMA   ACTINIC ELASTOSIS   ACTINIC KERATOSES   DERMATOFIBROMA   Return in about 3 months (around 02/01/2025) for TBSE, with Dr. Claudene, [facility], HxMM[facility].  LILLETTE Lonell Drones, RMA, am acting as scribe for Boneta Claudene, MD .   Documentation: I have reviewed the above documentation for accuracy and completeness, and I agree with the above.  Boneta Claudene, MD   "

## 2024-11-03 NOTE — Patient Instructions (Addendum)
 Biopsy Wound Care Instructions  Leave the original bandage on for 24 hours if possible.  If the bandage becomes soaked or soiled before that time, it is OK to remove it and examine the wound.  A small amount of post-operative bleeding is normal.  If excessive bleeding occurs, remove the bandage, place gauze over the site and apply continuous pressure (no peeking) over the area for 30 minutes. If this does not work, please call our clinic as soon as possible or page your doctor if it is after hours.   Once a day, cleanse the wound with soap and water. It is fine to shower. If a thick crust develops you may use a Q-tip dipped into dilute hydrogen peroxide (mix 1:1 with water) to dissolve it.  Hydrogen peroxide can slow the healing process, so use it only as needed.    After washing, apply petroleum jelly (Vaseline) or an antibiotic ointment if your doctor prescribed one for you, followed by a bandage.    For best healing, the wound should be covered with a layer of ointment at all times. If you are not able to keep the area covered with a bandage to hold the ointment in place, this may mean re-applying the ointment several times a day.  Continue this wound care until the wound has healed and is no longer open.   Itching and mild discomfort is normal during the healing process. However, if you develop pain or severe itching, please call our office.   If you have any discomfort, you can take Tylenol (acetaminophen) or ibuprofen as directed on the bottle. (Please do not take these if you have an allergy to them or cannot take them for another reason).  Some redness, tenderness and white or yellow material in the wound is normal healing.  If the area becomes very sore and red, or develops a thick yellow-green material (pus), it may be infected; please notify us .    If you have stitches, return to clinic as directed to have the stitches removed. You will continue wound care for 2-3 days after the stitches  are removed.   Wound healing continues for up to one year following surgery. It is not unusual to experience pain in the scar from time to time during the interval.  If the pain becomes severe or the scar thickens, you should notify the office.    A slight amount of redness in a scar is expected for the first six months.  After six months, the redness will fade and the scar will soften and fade.  The color difference becomes less noticeable with time.  If there are any problems, return for a post-op surgery check at your earliest convenience.  To improve the appearance of the scar, you can use silicone scar gel, cream, or sheets (such as Mederma or Serica) every night for up to one year. These are available over the counter (without a prescription).  Please call our office at [phone] for any questions or concerns.    Melanoma ABCDEs  Melanoma is the most dangerous type of skin cancer, and is the leading cause of death from skin disease.  You are more likely to develop melanoma if you: Have light-colored skin, light-colored eyes, or red or blond hair Spend a lot of time in the sun Tan regularly, either outdoors or in a tanning bed Have had blistering sunburns, especially during childhood Have a close family member who has had a melanoma Have atypical moles or large birthmarks  Early detection of melanoma is key since treatment is typically straightforward and cure rates are extremely high if we catch it early.   The first sign of melanoma is often a change in a mole or a new dark spot.  The ABCDE system is a way of remembering the signs of melanoma.  A for asymmetry:  The two halves do not match. B for border:  The edges of the growth are irregular. C for color:  A mixture of colors are present instead of an even brown color. D for diameter:  Melanomas are usually (but not always) greater than 6mm - the size of a pencil eraser. E for evolution:  The spot keeps changing in size,  shape, and color.  Please check your skin once per month between visits. You can use a small mirror in front and a large mirror behind you to keep an eye on the back side or your body.   If you see any new or changing lesions before your next follow-up, please call to schedule a visit.  Please continue daily skin protection including broad spectrum sunscreen SPF 30+ to sun-exposed areas, reapplying every 2 hours as needed when you're outdoors.    Due to recent changes in healthcare laws, you may see results of your pathology and/or laboratory studies on MyChart before the doctors have had a chance to review them. We understand that in some cases there may be results that are confusing or concerning to you. Please understand that not all results are received at the same time and often the doctors may need to interpret multiple results in order to provide you with the best plan of care or course of treatment. Therefore, we ask that you please give us  2 business days to thoroughly review all your results before contacting the office for clarification. Should we see a critical lab result, you will be contacted sooner.   If You Need Anything After Your Visit  If you have any questions or concerns for your doctor, please call our main line at [phone] and press option 4 to reach your doctor's medical assistant. If no one answers, please leave a voicemail as directed and we will return your call as soon as possible. Messages left after 4 pm will be answered the following business day.   You may also send us  a message via MyChart. We typically respond to MyChart messages within 1-2 business days.  For prescription refills, please ask your pharmacy to contact our office. Our fax number is [phone].  If you have an urgent issue when the clinic is closed that cannot wait until the next business day, you can page your doctor at the number below.    Please note that while we do our best to be  available for urgent issues outside of office hours, we are not available 24/7.   If you have an urgent issue and are unable to reach us , you may choose to seek medical care at your doctor's office, retail clinic, urgent care center, or emergency room.  If you have a medical emergency, please immediately call 911 or go to the emergency department.  Pager Numbers  - Dr. Hester: [phone]  - Dr. Jackquline: [phone]  - Dr. Claudene: [phone]   - Dr. Raymund: [phone]  In the event of inclement weather, please call our main line at [phone] for an update on the status of any delays or closures.  Dermatology Medication Tips: Please keep the boxes that topical medications come  in in order to help keep track of the instructions about where and how to use these. Pharmacies typically print the medication instructions only on the boxes and not directly on the medication tubes.   If your medication is too expensive, please contact our office at [phone] option 4 or send us  a message through MyChart.   We are unable to tell what your co-pay for medications will be in advance as this is different depending on your insurance coverage. However, we may be able to find a substitute medication at lower cost or fill out paperwork to get insurance to cover a needed medication.   If a prior authorization is required to get your medication covered by your insurance company, please allow us  1-2 business days to complete this process.  Drug prices often vary depending on where the prescription is filled and some pharmacies may offer cheaper prices.  The website www.goodrx.com contains coupons for medications through different pharmacies. The prices here do not account for what the cost may be with help from insurance (it may be cheaper with your insurance), but the website can give you the price if you did not use any insurance.  - You can print the associated coupon and take it with your  prescription to the pharmacy.  - You may also stop by our office during regular business hours and pick up a GoodRx coupon card.  - If you need your prescription sent electronically to a different pharmacy, notify our office through [facility] or by phone at [phone] option 4.     Si Usted Necesita Algo Despus de Su Visita  Tambin puede enviarnos un mensaje a travs de Clinical Cytogeneticist. Por lo general respondemos a los mensajes de MyChart en el transcurso de 1 a 2 das hbiles.  Para renovar recetas, por favor pida a su farmacia que se ponga en contacto con nuestra oficina. Randi lakes de fax es [place] [phone].  Si tiene un asunto urgente cuando la clnica est cerrada y que no puede esperar hasta el siguiente da hbil, puede llamar/localizar a su doctor(a) al nmero que aparece a continuacin.   Por favor, tenga en cuenta que aunque hacemos todo lo posible para estar disponibles para asuntos urgentes fuera del horario de [place], no estamos disponibles las 24 horas del da, los 7 [address]  [address] de la [place].   Si tiene un problema urgente y no puede comunicarse con nosotros, puede optar por buscar atencin mdica  en el consultorio de su doctor(a), en una clnica privada, en un centro de atencin urgente o en una sala de emergencias.  Si tiene engineer, drilling, por favor llame inmediatamente al 911 o vaya a la sala de emergencias.  Nmeros de bper  - Dr. Hester: [phone]  - Dra. Jackquline: [phone]  - Dr. Claudene: [phone]  - Dra. Kitts: [phone]  En caso de inclemencias del [place], por favor llame a nuestra lnea principal al [phone] para una actualizacin sobre el estado de cualquier retraso o cierre.  Consejos para la medicacin en dermatologa: Por favor, guarde las cajas en las que vienen los medicamentos de uso tpico para ayudarle a seguir las instrucciones sobre dnde y cmo usarlos. Las farmacias generalmente imprimen las instrucciones del  medicamento slo en las cajas y no directamente en los tubos del [place].   Si su medicamento es muy caro, por favor, pngase en contacto con landry rieger llamando al [phone] y presione la opcin 4 o envenos un mensaje a travs de Clinical Cytogeneticist.  No podemos decirle cul ser su copago por los medicamentos por adelantado ya que esto es diferente dependiendo de la cobertura de su seguro. Sin embargo, es posible que podamos encontrar un medicamento sustituto a audiological scientist un formulario para que el seguro cubra el medicamento que se considera necesario.   Si se requiere una autorizacin previa para que su compaa de seguros cubra su medicamento, por favor permtanos de 1 a 2 das hbiles para completar este proceso.  Los precios de los medicamentos varan con frecuencia dependiendo del environmental consultant de dnde se surte la receta y alguna farmacias pueden ofrecer precios ms baratos.  El sitio web www.goodrx.com tiene cupones para medicamentos de health and safety inspector. Los precios aqu no tienen en cuenta lo que podra costar con la ayuda del seguro (puede ser ms barato con su seguro), pero el sitio web puede darle el precio si no utiliz tourist information centre manager.  - Puede imprimir el cupn correspondiente y llevarlo con su receta a la farmacia.  - Tambin puede pasar por nuestra oficina durante el horario de atencin regular y education officer, museum una tarjeta de cupones de GoodRx.  - Si necesita que su receta se enve electrnicamente a una farmacia diferente, informe a nuestra oficina a travs de MyChart de [place] o por telfono llamando al [phone] y presione la opcin 4.

## 2024-11-05 ENCOUNTER — Telehealth: Payer: Self-pay | Admitting: Dermatology

## 2024-11-05 ENCOUNTER — Ambulatory Visit: Payer: Self-pay | Admitting: Dermatology

## 2024-11-05 LAB — SURGICAL PATHOLOGY

## 2024-11-05 NOTE — Telephone Encounter (Signed)
 Shared results by phone. Scalp bx shows Mis. L forearm likely evolving Mis. L flank severe dysplastic. Recommend Mohs and excision and excision respectively. Patient has rotator cuff surgery 1/26 and hopes to do all excisions before. Discussed that this will be difficult given the schedule but we will try

## 2024-11-09 ENCOUNTER — Other Ambulatory Visit: Payer: Self-pay

## 2024-11-09 ENCOUNTER — Other Ambulatory Visit: Payer: Self-pay | Admitting: Orthopedic Surgery

## 2024-11-09 DIAGNOSIS — D034 Melanoma in situ of scalp and neck: Secondary | ICD-10-CM

## 2024-11-11 ENCOUNTER — Other Ambulatory Visit: Payer: Self-pay | Admitting: Orthopedic Surgery

## 2024-11-17 ENCOUNTER — Ambulatory Visit: Admitting: Dermatology

## 2024-11-17 ENCOUNTER — Encounter: Payer: Self-pay | Admitting: Dermatology

## 2024-11-17 DIAGNOSIS — D2262 Melanocytic nevi of left upper limb, including shoulder: Secondary | ICD-10-CM | POA: Diagnosis not present

## 2024-11-17 DIAGNOSIS — D0362 Melanoma in situ of left upper limb, including shoulder: Secondary | ICD-10-CM

## 2024-11-17 MED ORDER — MUPIROCIN 2 % EX OINT: 1.0000 | TOPICAL_OINTMENT | Freq: Every day | CUTANEOUS | 0 refills | Status: AC

## 2024-11-17 NOTE — Patient Instructions (Signed)

## 2024-11-17 NOTE — Progress Notes (Signed)
" ° °  Follow-Up Visit   Subjective  Sophie Quiles. is a 79 y.o. male who presents for the following: Excision of ATYPICAL INTRAEPIDERMAL MELANOCYTIC PROLIFERATION  The following portions of the chart were reviewed this encounter and updated as appropriate: medications, allergies, medical history  Review of Systems:  No other skin or systemic complaints except as noted in HPI or Assessment and Plan.  Objective  Well appearing patient in no apparent distress; mood and affect are within normal limits.  A focused examination was performed of the following areas: Left arm Relevant physical exam findings are noted in the Assessment and Plan.   left proximal lateral forearm Pink bx site   Assessment & Plan   MELANOMA IN SITU OF LEFT UPPER EXTREMITY INCLUDING SHOULDER (HCC) left proximal lateral forearm - Skin excision  Excision method:  elliptical Total excision diameter (cm):  1.8 Informed consent: discussed and consent obtained   Timeout: patient name, date of birth, surgical site, and procedure verified   Procedure prep:  Patient was prepped and draped in usual sterile fashion Prep type:  Chlorhexidine  Anesthesia: the lesion was anesthetized in a standard fashion   Anesthetic:  1% lidocaine  w/ epinephrine  1-100,000 buffered w/ 8.4% NaHCO3 (15 cc) Instrument used: #15 blade   Hemostasis achieved with: suture, pressure and electrodesiccation   Outcome: patient tolerated procedure well with no complications   Additional details:  Tagged superior  - Skin repair Complexity:  Intermediate Final length (cm):  5.7 Informed consent: discussed and consent obtained   Timeout: patient name, date of birth, surgical site, and procedure verified   Procedure prep:  Patient was prepped and draped in usual sterile fashion Prep type:  Chlorhexidine  Anesthesia: the lesion was anesthetized in a standard fashion   Anesthetic:  1% lidocaine  w/ epinephrine  1-100,000 buffered w/ 8.4%  NaHCO3 Reason for type of repair: reduce tension to allow closure, reduce the risk of dehiscence, infection, and necrosis, reduce subcutaneous dead space and avoid a hematoma, allow closure of the large defect and preserve normal anatomy   Undermining: edges could be approximated without difficulty   Subcutaneous layers (deep stitches):  Suture size:  4-0 Suture type: Vicryl (polyglactin 910)   Stitches:  Buried vertical mattress Fine/surface layer approximation (top stitches):  Suture size:  5-0 Suture type: Prolene (polypropylene)   Stitches comment:  Running locked Hemostasis achieved with: suture, pressure and electrodesiccation Outcome: patient tolerated procedure well with no complications   Post-procedure details: sterile dressing applied and wound care instructions given   Dressing type: petrolatum, bandage and pressure dressing    Specimen 1 - Surgical pathology Differential Diagnosis: BX proven ATYPICAL INTRAEPIDERMAL MELANOCYTIC PROLIFERATION  Check Margins: yes [id number] Tagged superior    No follow-ups on file.  LILLETTE Lonell Drones, RMA, am acting as scribe for Boneta Sharps, MD .   Documentation: I have reviewed the above documentation for accuracy and completeness, and I agree with the above.  Boneta Sharps, MD  "

## 2024-11-18 ENCOUNTER — Telehealth (HOSPITAL_BASED_OUTPATIENT_CLINIC_OR_DEPARTMENT_OTHER): Payer: Self-pay | Admitting: *Deleted

## 2024-11-18 ENCOUNTER — Encounter
Admission: RE | Admit: 2024-11-18 | Discharge: 2024-11-18 | Disposition: A | Discharge status: Home / Self Care | Source: Ambulatory Visit | Attending: Orthopedic Surgery | Admitting: Orthopedic Surgery

## 2024-11-18 ENCOUNTER — Encounter: Payer: Self-pay | Admitting: Orthopedic Surgery

## 2024-11-18 ENCOUNTER — Telehealth: Payer: Self-pay | Admitting: Cardiovascular Disease

## 2024-11-18 ENCOUNTER — Other Ambulatory Visit: Payer: Self-pay

## 2024-11-18 ENCOUNTER — Encounter: Payer: Self-pay | Admitting: Dermatology

## 2024-11-18 DIAGNOSIS — Z01810 Encounter for preprocedural cardiovascular examination: Secondary | ICD-10-CM

## 2024-11-18 DIAGNOSIS — I10 Essential (primary) hypertension: Secondary | ICD-10-CM | POA: Insufficient documentation

## 2024-11-18 DIAGNOSIS — I2510 Atherosclerotic heart disease of native coronary artery without angina pectoris: Secondary | ICD-10-CM | POA: Insufficient documentation

## 2024-11-18 DIAGNOSIS — I2119 ST elevation (STEMI) myocardial infarction involving other coronary artery of inferior wall: Secondary | ICD-10-CM

## 2024-11-18 DIAGNOSIS — Z01812 Encounter for preprocedural laboratory examination: Secondary | ICD-10-CM

## 2024-11-18 DIAGNOSIS — Z01818 Encounter for other preprocedural examination: Secondary | ICD-10-CM | POA: Diagnosis not present

## 2024-11-18 DIAGNOSIS — I739 Peripheral vascular disease, unspecified: Secondary | ICD-10-CM

## 2024-11-18 DIAGNOSIS — M25512 Pain in left shoulder: Secondary | ICD-10-CM

## 2024-11-18 HISTORY — DX: Nausea with vomiting, unspecified: R11.2

## 2024-11-18 HISTORY — DX: Unspecified osteoarthritis, unspecified site: M19.90

## 2024-11-18 HISTORY — DX: Unspecified rotator cuff tear or rupture of left shoulder, not specified as traumatic: M75.102

## 2024-11-18 HISTORY — DX: Gastro-esophageal reflux disease without esophagitis: K21.9

## 2024-11-18 LAB — CBC
HCT: 44.3 % (ref 39.0–52.0)
Hemoglobin: 15.2 g/dL (ref 13.0–17.0)
MCH: 33.3 pg (ref 26.0–34.0)
MCHC: 34.3 g/dL (ref 30.0–36.0)
MCV: 97.1 fL (ref 80.0–100.0)
Platelets: 170 K/uL (ref 150–400)
RBC: 4.56 MIL/uL (ref 4.22–5.81)
RDW: 12.1 % (ref 11.5–15.5)
WBC: 7 K/uL (ref 4.0–10.5)
nRBC: 0 % (ref 0.0–0.2)

## 2024-11-18 LAB — BASIC METABOLIC PANEL WITH GFR
Anion gap: 9 (ref 5–15)
BUN: 18 mg/dL (ref 8–23)
CO2: 27 mmol/L (ref 22–32)
Calcium: 9.6 mg/dL (ref 8.9–10.3)
Chloride: 105 mmol/L (ref 98–111)
Creatinine, Ser: 1.13 mg/dL (ref 0.61–1.24)
GFR, Estimated: >60 mL/min
Glucose, Bld: 76 mg/dL (ref 70–99)
Potassium: 3.8 mmol/L (ref 3.5–5.1)
Sodium: 141 mmol/L (ref 135–145)

## 2024-11-18 NOTE — Telephone Encounter (Signed)
"  ° °  Pre-operative Risk Assessment    Patient Name: Ricardo Winters.  DOB: January 04, 1946 MRN: [id number]   Date of last office visit: 04/17/2024 Date of next office visit: 11/20/2024   Request for Surgical Clearance    Procedure:  Lt shoulder rotator cuff repair  Date of Surgery:  Clearance 11/23/24                                Surgeon:  Earnestine Blanch, MD Surgeon's Group or Practice Name:  [facility] Phone number:  [phone] Fax number:  [phone]   Type of Clearance Requested:   Medical   Type of Anesthesia:  Not Indicated   Additional requests/questions:  Please advise surgeon/provider what medications should be held.  Bonney Audrene LOISE Agapito   11/18/2024, 3:06 PM   "

## 2024-11-18 NOTE — Telephone Encounter (Signed)
" ° °  Name: Ricardo E Cade Jr.  DOB: 06-03-1946  MRN: [id number]  Primary Cardiologist: Evalene Lunger, MD  Chart reviewed as part of pre-operative protocol coverage. Because of Ricardo PRESSLY Jr.'s past medical history and time since last visit, he will require a follow-up in-office visit in order to better assess preoperative cardiovascular risk. Last seen 03/2024 with symptoms of exertional lightheadedness, chest tightness, PVCs on EKG. Cardiac PET scan and Zio were ordered with recommendation for 1 month follow-up. Patient had a subsequent ED visit in 03/2024 for more chest pressure and messaged that he was deferring testing. He later went forward with cardiac PET in 05/2024 which was reassuring. Zio was not completed. Per preop protocol, given symptoms with plan for testing and in-office reassessment, needs in office OV prior to clearing for surgery. Needing to accomplish this will likely impact date of surgery as he will need adequate time to both schedule then hold Plavix if appropriate.  Pre-op covering staff: - Please schedule appointment and call patient to inform them. If patient already had an upcoming appointment within acceptable timeframe, please add pre-op clearance to the appointment notes so provider is aware. - Please contact requesting surgeon's office via preferred method (i.e, phone, fax) to inform them of need for appointment prior to surgery.  This message will also be routed to Dr. Gollan for input on holding Plavix as requested below so that this information is available to the clearing provider at time of patient's appointment. 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 Plavix was started by outside neurology provider in 01/2024, no longer on ASA in setting of prior duodenal issues. Dr. Gollan - Please route response to P CV DIV PREOP (the pre-op pool). Thank you.  Ricardo Winters N Ricardo Mcgovern, PA-C   11/18/2024, 10:42 AM   "

## 2024-11-18 NOTE — Patient Instructions (Addendum)
 Your procedure is scheduled on: 11/23/24 - Monday Report to the Registration Desk on the 1st floor of the [facility]. To find out your arrival time, please call [phone] between 1PM - 3PM on: 11/20/24 - Friday If your arrival time is 6:00 am, do not arrive before that time as the [facility] entrance doors do not open until 6:00 am.  REMEMBER: Instructions that are not followed completely may result in serious medical risk, up to and including death; or upon the discretion of your surgeon and anesthesiologist your surgery may need to be rescheduled.  Do not eat food after midnight the night before surgery.  No gum chewing or hard candies.  You may however, drink CLEAR liquids up to 2 hours before you are scheduled to arrive for your surgery. Do not drink anything within 2 hours of your scheduled arrival time.  Clear liquids include: - water   - apple juice without pulp - gatorade (not RED colors) - black coffee or tea (Do NOT add milk or creamers to the coffee or tea) Do NOT drink anything that is not on this list.  In addition, your doctor has ordered for you to drink the provided:  Ensure Pre-Surgery Clear Carbohydrate Drink  Drinking this carbohydrate drink up to two hours before surgery helps to reduce insulin  resistance and improve patient outcomes. Please complete drinking 2 hours before scheduled arrival time.  One week prior to surgery: Stop Anti-inflammatories (NSAIDS) such as Advil, Aleve, Ibuprofen, Motrin, Naproxen, Naprosyn and Aspirin  based products such as Excedrin, Goody's Powder, BC Powder. You may continue to take Tylenol  if needed for pain up until the day of surgery.  Stop ANY OVER THE COUNTER supplements until after surgery.  Hold Plavix beginning 11/18/24.  ON THE DAY OF SURGERY ONLY TAKE THESE MEDICATIONS WITH SIPS OF WATER :  none   No Alcohol for 24 hours before or after surgery.  No Smoking including e-cigarettes for 24 hours before surgery.  No  chewable tobacco products for at least 6 hours before surgery.  No nicotine patches on the day of surgery.  Do not use any recreational drugs for at least a week (preferably 2 weeks) before your surgery.  Please be advised that the combination of cocaine and anesthesia may have negative outcomes, up to and including death. If you test positive for cocaine, your surgery will be cancelled.  On the morning of surgery brush your teeth with toothpaste and water , you may rinse your mouth with mouthwash if you wish. Do not swallow any toothpaste or mouthwash.  Use CHG Soap or wipes as directed on instruction sheet.  Do not wear jewelry, make-up, hairpins, clips or nail polish.  For welded (permanent) jewelry: bracelets, anklets, waist bands, etc.  Please have this removed prior to surgery.  If it is not removed, there is a chance that hospital personnel will need to cut it off on the day of surgery.  Do not wear lotions, powders, or perfumes.   Do not shave body hair from the neck down 48 hours before surgery.  Contact lenses, hearing aids and dentures may not be worn into surgery.  Do not bring valuables to the hospital. [facility] is not responsible for any missing/lost belongings or valuables.   Notify your doctor if there is any change in your medical condition (cold, fever, infection).  Wear comfortable clothing (specific to your surgery type) to the hospital.  After surgery, you can help prevent lung complications by doing breathing exercises.  Take  deep breaths and cough every 1-2 hours. Your doctor may order a device called an Incentive Spirometer to help you take deep breaths.  If you are being admitted to the hospital overnight, leave your suitcase in the car. After surgery it may be brought to your room.  In case of increased patient census, it may be necessary for you, the patient, to continue your postoperative care in the Same Day Surgery department.  If you are being  discharged the day of surgery, you will not be allowed to drive home. You will need a responsible individual to drive you home and stay with you for 24 hours after surgery.   If you are taking public transportation, you will need to have a responsible individual with you.  Please call the Pre-admissions Testing Dept. at [phone] if you have any questions about these instructions.  Surgery Visitation Policy:  Patients having surgery or a procedure may have two visitors.  Children under the age of 104 must have an adult with them who is not the patient.  Inpatient Visitation:    Visiting hours are 7 a.m. to 8 p.m. Up to four visitors are allowed at one time in a patient room. The visitors may rotate out with other people during the day.  One visitor age 9 or older may stay with the patient overnight and must be in the room by 8 p.m.   Merchandiser, Retail to address health-related social needs:  [email or web address]                                                                                                            Preparing for Surgery with CHLORHEXIDINE  GLUCONATE (CHG) Soap  Chlorhexidine  Gluconate (CHG) Soap  o An antiseptic cleaner that kills germs and bonds with the skin to continue killing germs even after washing  o Used for showering the night before surgery and morning of surgery  Before surgery, you can play an important role by reducing the number of germs on your skin.  CHG (Chlorhexidine  gluconate) soap is an antiseptic cleanser which kills germs and bonds with the skin to continue killing germs even after washing.  Please do not use if you have an allergy to CHG or antibacterial soaps. If your skin becomes reddened/irritated stop using the CHG.  1. Shower the NIGHT BEFORE SURGERY with CHG soap.  2. If you choose to wash your hair, wash your hair first as usual with your normal shampoo.  3. After shampooing, rinse your hair and body  thoroughly to remove the shampoo.  4. Use CHG as you would any other liquid soap. You can apply CHG directly to the skin and wash gently with a clean washcloth.  5. Apply the CHG soap to your body only from the neck down. Do not use on open wounds or open sores. Avoid contact with your eyes, ears, mouth, and genitals (private parts). Wash face and genitals (private parts) with your normal soap.  6. Wash thoroughly, paying special attention to the area where your surgery will  be performed.  7. Thoroughly rinse your body with warm water .  8. Do not shower/wash with your normal soap after using and rinsing off the CHG soap.  9. Do not use lotions, oils, etc., after showering with CHG.  10. Pat yourself dry with a clean towel.  11. Wear clean pajamas to bed the night before surgery.  12. Place clean sheets on your bed the night of your shower and do not sleep with pets.  13. Do not apply any deodorants/lotions/powders.  14. Please wear clean clothes to the hospital.  15. Remember to brush your teeth with your regular toothpaste.

## 2024-11-18 NOTE — Telephone Encounter (Signed)
 Given his cardiovascular history and recent complaints (see note from Abigail, PA-C), patient will require preoperative clearance from the cardiology service line prior to undergoing the planned elective orthopedic procedure.   Patient to be notified of message regarding need for further follow up with cardiology. He has a lab appointment today at 1330 PM. Will advise him to contact both cardiology and Dr. Anthony office to discuss plans for surgery.   Copy of note from cardiology forwarded to Dr. Anthony office to make them aware.   Dorise Pereyra, MSN, APRN, FNP-C, CEN [facility]  Perioperative Services Nurse Practitioner 11/18/24 12:29 PM

## 2024-11-18 NOTE — Telephone Encounter (Addendum)
-----   Message from Dorise Pereyra, NP sent at 11/18/2024  9:13 AM EST ----- Regarding: Request for pre-operative cardiac clearance Request for pre-operative cardiac clearance:   1. What type of surgery is being performed?  Left shoulder arthroscopic rotator cuff repair, biceps tenodesis, distal clavicle excision, subacromial decompression    2. When is this surgery scheduled?  11/23/2024   3. Type of clearance being requested (medical, pharmacy, both)? Both   4. Are there any medications that need to be held prior to surgery? Clopidogrel  5. Practice name and name of physician performing surgery?  Performing surgeon: Dr. Earnestine Blanch, MD Requesting clearance: Dorise Pereyra, FNP-C     6. Anesthesia type (none, local, MAC, general)? GENERAL  7. What is the office phone and fax number?   Phone: [phone] Fax: Return FAX not required. I will follow up in [facility].  ATTENTION: Unable to create telephone message as per your standard workflow. Directed by [facility] providers to send requests for cardiac clearance to this pool for appropriate distribution to provider covering pre-operative clearances.   Dorise Pereyra, MSN, APRN, FNP-C, CEN [facility]  Peri-operative Services Nurse Practitioner Phone: [phone] 11/18/24 9:13 AM

## 2024-11-18 NOTE — Telephone Encounter (Signed)
"  ° °  Pre-operative Risk Assessment    Patient Name: Ricardo Winters.  DOB: [birth year] MRN: [id number]   Date of last office visit: 04/17/24 MEDFORD MEAGER, FNP Date of next office visit: 12/07/24 DR. GOLLAN   Request for Surgical Clearance    Procedure:  Left shoulder arthroscopic rotator cuff repair, biceps tenodesis, distal clavicle excision, subacromial decompression    Date of Surgery:  Clearance 11/24/24  (HOWEVER, EPIC SHOWS SURGERY DATE IS 11/23/24)                              Surgeon:  DR. EARNESTINE PATEL Surgeon's Group or Practice Name:  [facility] Phone number:  [phone] Fax number:  Return FAX not required. I will follow up in [facility].    Type of Clearance Requested:   - Medical  - Pharmacy:  Hold Clopidogrel (Plavix)     Type of Anesthesia:  General    Additional requests/questions:    Bonney Niels Jest   11/18/2024, 10:18 AM   "

## 2024-11-18 NOTE — Telephone Encounter (Signed)
 I called Dr. Anthony office and s/w office staff. When I asked to s/w Dorise Pereyra, FNP about pt, she said they did not have a Dorise Pereyra, FNP and she did not know who he was. I had stated that he is the Nurse Practitioner who works with the surgery/anesthesia dept for their MD's.   I then sent a staff message to Dorise Pereyra, FNP to review notes per preop APP Raphael Bring, PAC. Pt has appt 12/07/24 with DR. Gollan.    I have also faxed these notes to Dorise Pereyra, FNP as well.

## 2024-11-19 ENCOUNTER — Encounter: Payer: Self-pay | Admitting: Orthopedic Surgery

## 2024-11-19 ENCOUNTER — Encounter: Admitting: Dermatology

## 2024-11-19 LAB — SURGICAL PATHOLOGY

## 2024-11-19 NOTE — Telephone Encounter (Signed)
" ° °  Name: Ricardo E Cauble Jr.  DOB: 1946-01-04  MRN: [id number]  Primary Cardiologist: Evalene Lunger, MD   Preoperative team, please contact this patient and set up a phone call appointment for further preoperative risk assessment. Please obtain consent and complete medication review. Thank you for your help.  I confirm that guidance regarding antiplatelet and oral anticoagulation therapy has been completed and, if necessary, noted below. None requested.   I also confirmed the patient resides in the state of [place] . As per [facility] Medical Board telemedicine laws, the patient must reside in the state in which the provider is licensed.   Mardy KATHEE Pizza, FNP 11/19/2024, 8:38 AM [facility]    "

## 2024-11-19 NOTE — Telephone Encounter (Signed)
 I will d/w the preop APP the pt has in office appt tomorrow 11/20/24 Tylene Lunch, NP, will confirm if tele appt is still needed.    Per preop APP: no tele needed. apologies. does it say pre op clearance?   Me: I confirmed yes the appt states preop clearance.     Teresa Mardy NOVAK, FNP    11/19/24  8:39 AM Note    Name: Ricardo Winters.  DOB: [birth year]  MRN: [id number]   Primary Cardiologist: Evalene Lunger, MD     Preoperative team, please contact this patient and set up a phone call appointment for further preoperative risk assessment. Please obtain consent and complete medication review. Thank you for your help.   I confirm that guidance regarding antiplatelet and oral anticoagulation therapy has been completed and, if necessary, noted below. None requested.    I also confirmed the patient resides in the state of [place] . As per [facility] Medical Board telemedicine laws, the patient must reside in the state in which the provider is licensed.     Mardy NOVAK Teresa, FNP 11/19/2024, 8:38 AM [facility]

## 2024-11-19 NOTE — Progress Notes (Signed)
 " Perioperative / Anesthesia Services  Pre-Admission Testing Clinical Review / Pre-Operative Anesthesia Consult  Date: 11/20/24  PATIENT DEMOGRAPHICS: Name: Ricardo Winters. DOB: 1945/12/29 MRN:   [id number]  Note: Available PAT nursing documentation and vital signs have been reviewed. Clinical nursing staff has updated patient's PMH/PSHx, current medication list, and drug allergies/intolerances to ensure complete and comprehensive history available to assist care teams in MDM as it pertains to the aforementioned surgical procedure and anticipated anesthetic course. Extensive review of available clinical information personally performed. Nursing documentation reviewed. [place] PMH and PSHx updated with any diagnoses and/or procedures that I have knowledge of that may have been inadvertently omitted during his intake with the pre-admission testing department's nursing staff.  PLANNED SURGICAL PROCEDURE(S):   Case: [id number] Date: 12/14/24   Procedures:      ARTHROSCOPY, SHOULDER, WITH ROTATOR CUFF REPAIR (Left) (canceled) - Left shoulder arthroscopic rotator cuff repair, biceps tenodesis, distal clavicle excision, subacromial decompression     DECOMPRESSION, SUBACROMIAL SPACE (Left) (canceled) - Left shoulder arthroscopic rotator cuff repair, biceps tenodesis, distal clavicle excision, subacromial decompression     TENODESIS, BICEPS (Left) (canceled) - Left shoulder arthroscopic rotator cuff repair, biceps tenodesis, distal clavicle excision, subacromial decompression   Anesthesia type: Choice   Diagnosis: Traumatic complete tear of left rotator cuff, initial encounter [S46.012A]   Pre-op diagnosis: Traumatic complete tear of left rotator cuff, initial encounter D53.987J   Location: [facility]   Surgeons: Tobie Priest, MD        CLINICAL DISCUSSION: Ricardo Winters. is a 79 y.o. male who is submitted for pre-surgical anesthesia review and clearance prior to him  undergoing the above procedure. Patient has never been a smoker in the past. Pertinent PMH includes: CAD, inferior STEMI, ischemic cardiomyopathy with resulting HFmrEF, sinus bradycardia, PVD, CVA, chronic cerebral microvascular disease, DVT, angina, HTN, HLD, GERD (on daily H2 blocker), BPH, OA, LEFT rotator cuff tear.  Patient is followed by cardiology (Gollan, MD). He was last seen in the cardiology clinic on 11/20/2024; notes reviewed. At the time of his clinic visit, patient doing well overall from a cardiovascular perspective. Patient denied any chest pain, shortness of breath, PND, orthopnea, palpitations, significant peripheral edema, weakness, fatigue, vertiginous symptoms, or presyncope/syncope. Patient with a past medical history significant for cardiovascular diagnoses. Documented physical exam was grossly benign, providing no evidence of acute exacerbation and/or decompensation of the patient's known cardiovascular conditions.  Most recent TTE performed on 03/10/2018 revealed a normal left ventricular systolic function with an EF of 55-60%.There were no regional wall motion abnormalities. Left ventricular diastolic Doppler parameters were normal. Right ventricular size and function normal. There was no significant valvular regurgitation.  All transvalvular gradients were noted to be normal providing no evidence of hemodynamically significant valvular stenosis. Aorta normal in size with no evidence of ectasia or aneurysmal dilatation.  Cardiac PET/CT was performed on 06/11/2024 revealing a normal left ventricular systolic function with a resting EF of 56%; stress EF 63%.  Myocardial blood flow was computed to be at 0.74 mL/g/min at rest and 2.19 mL/g/min with stress.  Global myocardial blood flow reserve was 2.96 and was normal.  Left ventricular perfusion normal with no evidence suggestive of reversible ischemia.  No evidence of infarction.  Study determined to be normal and low risk.  Blood  pressure mildly elevated at 140/86 mmHg on currently prescribed ARB (losartan ) and CCB (amlodipine) therapies. He had not taken his amlodipine at the time of his visit.  Patient is  on omega-3 fatty acid + rosuvastatin  for his HLD diagnosis and ASCVD prevention.  Patient has a supply of short acting nitrates (NTG) to use on an as needed basis for recurrent angina/anginal equivalent symptoms; denied recent use. Patient is not diabetic. He does not have an OSAH diagnosis. Patient is able to complete all of his  ADL/IADLs without cardiovascular limitation.  Per the DASI, patient is able to achieve at least 4 METS of physical activity without experiencing any significant degree of angina/anginal equivalent symptoms. No changes were made to his medication regimen during his visit with cardiology.  Patient scheduled to follow-up with outpatient cardiology in 12 months or sooner if needed.  Ricardo Winters. is scheduled for an elective ARTHROSCOPY, SHOULDER, WITH ROTATOR CUFF REPAIR (Left); DECOMPRESSION, SUBACROMIAL SPACE (Left); TENODESIS, BICEPS (Left) on 11/23/2024 with Dr. Earnestine Blanch, MD. Given patient's past medical history significant for cardiovascular diagnoses, presurgical cardiac clearance was sought by the PAT team. Per cardiology, [name]'s perioperative risk of a major cardiac event is 11% according to the Revised Cardiac Risk Index (RCRI).  Therefore, he is at high risk for perioperative complications.   His functional capacity is good at 8.23 METs according to the Duke Activity Status Index (DASI). According to ACC/AHA guidelines, no further cardiovascular testing needed.  The patient may proceed to surgery at ACCEPTABLE risk.  In review of his medication reconciliation, the patient is noted to be taking daily antithrombotic therapy (clopidogrel). He has been advised to hold this medication for 5 days prior to his procedure with plans to restart as soon as deemed to be hemodynamically stable  postoperatively by surgery.   Patient reports previous perioperative complications with anesthesia in the past. Patient has a PMH (+) for PONV. Symptoms and history of PONV will be discussed with patient by anesthesia team on the day of her procedure. Interventions will be ordered as deemed necessary based on patient's individual care needs as determined by anesthesiologist. In review his EMR, it is noted that patient underwent a general anesthetic course here at [facility] ([facility]) in 10/2022 without documented complications.   MOST RECENT VITAL SIGNS:    11/20/2024   10:08 AM 11/20/2024   10:05 AM 10/21/2024    1:02 PM  Vitals with BMI  Height  6' 1   Weight  236 lbs 240 lbs  BMI  31.14   Systolic 140 142   Diastolic 86 90   Pulse  72    PROVIDERS/SPECIALISTS: NOTE: Primary physician provider listed below. Patient may have been seen by APP or partner within same practice.   PROVIDER ROLE / SPECIALTY LAST SHERLEAN Blanch Earnestine, MD Orthopedics (Surgeon) 11/06/2023  Cleotilde Oneil FALCON, MD Primary Care Provider 07/13/2024  Perla Lye, MD Cardiology 11/20/2024  Blanch Solo, MD Rheumatology 05/27/2024   ALLERGIES: Gadolinium derivatives and Nsaids  CURRENT HOME MEDICATIONS:  acetaminophen  (TYLENOL ) 500 MG tablet   Bioflavonoid Products (GRAPE SEED EXTRACT) CAPS   Capsicum, Cayenne, (CAYENNE PO)   clopidogrel (PLAVIX) 75 MG tablet   COLLAGEN PO   cyanocobalamin  1000 MCG tablet   famotidine  (PEPCID ) 20 MG tablet   losartan  (COZAAR ) 50 MG tablet   nitroGLYCERIN  (NITROSTAT ) 0.4 MG SL tablet   Omega-3 Fatty Acids (OMEGA 3 PO)   OVER THE COUNTER MEDICATION   rosuvastatin  (CRESTOR ) 5 MG tablet   tamsulosin (FLOMAX) 0.4 MG CAPS capsule   TURMERIC PO   Vitamin D-Vitamin K (VITAMIN K2-VITAMIN D3) 90-125 MCG CAPS  zinc gluconate 50 MG tablet   amLODipine (NORVASC) 5 MG tablet   mupirocin  ointment (BACTROBAN ) 2 %   HISTORY: Past Medical History:   Diagnosis Date   Anginal pain    Aortic atherosclerosis    Arthritis    Basal cell carcinoma 10/10/2023   Right upper back. BCC with moderately atypical dysplastic nevus. Excision 12/18/23   BPH (benign prostatic hyperplasia)    Cerebral infarct [facility])    a.) brain MRI 12/2023: small chronic cerebral infarcts   Cerebral microvascular disease    Coronary artery disease    a) Inf STEMI/PCI 08/12/2014: 100% mRCA --> 3.5 x 23 mm Xience Alpine DES; b.) 03/2020 MV: No ischemia. EF 51%.   COVID-19 09/2019   DDD (degenerative disc disease), lumbar    Deep vein thrombosis (DVT) (HCC)    Dysplastic nevus 11/03/2024   left lower flank, severe, needs excision   Edema    GERD (gastroesophageal reflux disease)    Heart failure with mid-range ejection fraction (HCC)    a. 03/2020 Echo: EF 45%.   HLD (hyperlipidemia)    Hypertension    Ischemic cardiomyopathy    a. 03/2020 Echo: EF 45%, mild LVH, mild MR/TR.   Lightheadedness    a. 12/2023 MRI Brain: Mild chronic small vessel ischemic disease.  Small chronic cerebellar infarcts; b. 01/2024 CT/A Head: No acute intracranial abnormality.  Small vessel ischemic changes. No proximal intracranial large vessel occlusion.  Severe stenoses within the mid M2 middle cerebral arteries bilaterally.  Intracranial and left vertebral atherosclerosis.   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 Mohs 09/16/23 Dr Gregorio   Melanoma in situ [facility]) 11/03/2024   left parietal scalp, Mohs   Melanoma in situ (HCC)    left proximal lateral forearm, ATYPICAL INTRAEPIDERMAL MELANOCYTIC PROLIFERATION. excised 11/17/24   Obesity    PONV (postoperative nausea and vomiting)    PVD (peripheral vascular disease)    Rotator cuff tear, left    Sinus bradycardia    Skin cancer of nose    ST elevation myocardial infarction (STEMI) of inferior wall (HCC) 08/12/2014   a.) LHC/PCI 08/12/2014: 100% mRCA --> 3.5 x 23 mm Xience  Alpine DES   Varicose veins of both lower extremities    Past Surgical History:  Procedure Laterality Date   CATARACT EXTRACTION W/PHACO Right 11/30/2020   Procedure: CATARACT EXTRACTION PHACO AND INTRAOCULAR LENS PLACEMENT (IOC)  RIGHT 7.70 01:03.4 12.1%;  Surgeon: Mittie Gaskin, MD;  Location: [facility];  Service: Ophthalmology;  Laterality: Right;  requests early   COLONOSCOPY WITH ESOPHAGOGASTRODUODENOSCOPY (EGD)     COLONOSCOPY WITH PROPOFOL  N/A 12/12/2015   Procedure: COLONOSCOPY WITH PROPOFOL ;  Surgeon: Lamar ONEIDA Holmes, MD;  Location: [facility];  Service: Endoscopy;  Laterality: N/A;   CORONARY ANGIOPLASTY WITH STENT PLACEMENT Left 08/02/2014   Procedure: CORONARY ANGIOPLASTY WITH STENT PLACEMENT; Location: [facility]; Surgeon: Margie Lovelace, MD   KNEE SURGERY Right    arthoscopic   LAPAROTOMY N/A 11/21/2022   Procedure: EXPLORATORY LAPAROTOMY;  Surgeon: Jordis Laneta FALCON, MD;  Location: [facility] ORS;  Service: General;  Laterality: N/A;   SHOULDER ACROMIOPLASTY Right    TOTAL HIP ARTHROPLASTY Bilateral    Right 2017, left 2020   Family History  Problem Relation Age of Onset   Heart disease Father    Obesity Brother    Obesity Brother    Social History   Tobacco Use   Smoking status: Never  Passive exposure: Never   Smokeless tobacco: Never  Substance Use Topics   Alcohol use: Yes    Alcohol/week: 4.0 standard drinks of alcohol    Types: 4 Glasses of wine per week    Comment: 2-3 per weekly   LABS:  Lab Results  Component Value Date   WBC 7.0 11/18/2024   HGB 15.2 11/18/2024   HCT 44.3 11/18/2024   MCV 97.1 11/18/2024   PLT 170 11/18/2024   Lab Results  Component Value Date   NA 141 11/18/2024   CL 105 11/18/2024   K 3.8 11/18/2024   CO2 27 11/18/2024   BUN 18 11/18/2024   CREATININE 1.13 11/18/2024   GFRNONAA >60 11/18/2024   CALCIUM  9.6 11/18/2024   PHOS 3.0 11/25/2022   ALBUMIN 3.6 04/18/2024   GLUCOSE 76 11/18/2024    ECG: Date:  11/18/2024  Time ECG obtained: 1340 PM Rate: 61 bpm Rhythm: Sinus rhythm with first-degree AV block Axis (leads I and aVF): normal Intervals: PR 242 ms. QRS 88 ms. QTc 434 ms. ST segment and T wave changes: No evidence of acute T wave abnormalities or significant ST segment elevation or depression.  Evidence of a possible, age undetermined, prior infarct:  No Comparison: Similar to previous tracing obtained on 04/18/2024   IMAGING / PROCEDURES: MR SHOULDER LEFT WO CONTRAST performed on 10/21/2024 High-grade partial-thickness bursal sided tear of the supraspinatus tendon with possible focal full-thickness perforation centrally.  Edema extends along the supraspinatus myotendinous junction. Bursal sided fraying of the anterior junctional fibers of the infraspinatus tendon with tendinosis. Moderate degenerative arthropathy of the acromioclavicular joint Subacromial/subdeltoid bursitis.  NM PET CT CARDIAC PERFUSION MULTI W/ABSOLUTE BLOODFLOW performed on 06/11/2024 LV perfusion is normal. There is no evidence of ischemia. There is no evidence of infarction. Rest left ventricular function is normal. Rest EF: 56%. Stress left ventricular function is normal. Stress EF: 63%. End diastolic cavity size is normal. End systolic cavity size is normal. Myocardial blood flow was computed to be 0.65ml/g/min at rest and 2.19ml/g/min at stress. Global myocardial blood flow reserve was 2.96 and was normal (though can be unreliable in setting of prior PCI) Coronary calcium  assessment not performed due to prior revascularization. The study is normal. The study is low risk.  TRANSTHORACIC ECHOCARDIOGRAM performed on 03/10/2018 Normal left ventricular systolic function with an EF of 55-60% No LVH No regional wall motion abnormalities Right ventricular size and function are normal No significant valvular regurgitation Normal gradients; no valvular stenosis  IMPRESSION AND PLAN: Ricardo E Brier Jr. has  been referred for pre-anesthesia review and clearance prior to him undergoing the planned anesthetic and procedural courses. Available labs, pertinent testing, and imaging results were personally reviewed by me in preparation for upcoming operative/procedural course. [facility] medical record has been updated following extensive record review and patient interview with PAT staff.   This patient has been appropriately cleared by cardiology with an overall ACCEPTABLE risk of patient experiencing significant perioperative cardiovascular complications. here at [facility]. Based on clinical review performed today (11/20/24), barring any significant acute changes in the patient's overall condition, it is anticipated that he will be able to proceed with the planned surgical intervention. Any acute changes in clinical condition may necessitate his procedure being postponed and/or cancelled. Patient will meet with anesthesia team (MD and/or CRNA) on the day of his procedure for preoperative evaluation/assessment. Questions regarding anesthetic course will be fielded at that time.   Pre-surgical instructions were reviewed with the  patient during his PAT appointment, and questions were fielded to satisfaction by PAT clinical staff. He has been instructed on which medications that he will need to hold prior to surgery, as well as the ones that have been deemed safe/appropriate to take on the day of his procedure. As part of the general education provided by PAT, patient made aware both verbally and in writing, that he would need to abstain from the use of any illegal substances during his perioperative course. He was advised that failure to follow the provided instructions could necessitate case cancellation or result in serious perioperative complications up to and including death. Patient encouraged to contact PAT and/or his surgeon's office to discuss any questions or concerns that may  arise prior to surgery; verbalized understanding.   Dorise Pereyra, MSN, APRN, FNP-C, CEN [facility]  Perioperative Services Nurse Practitioner Phone: [phone] Fax: [phone] 11/20/24 1:21 PM  NOTE: This note has been prepared using Scientist, clinical (histocompatibility and immunogenetics). Despite my best ability to proofread, there is always the potential that unintentional transcriptional errors may still occur from this process. "

## 2024-11-20 ENCOUNTER — Ambulatory Visit: Attending: Cardiology | Admitting: Cardiology

## 2024-11-20 ENCOUNTER — Encounter: Payer: Self-pay | Admitting: Cardiology

## 2024-11-20 ENCOUNTER — Other Ambulatory Visit: Payer: Self-pay | Admitting: Orthopedic Surgery

## 2024-11-20 VITALS — BP 140/86 | HR 72 | Ht 73.0 in | Wt 236.0 lb

## 2024-11-20 DIAGNOSIS — E785 Hyperlipidemia, unspecified: Secondary | ICD-10-CM

## 2024-11-20 DIAGNOSIS — I493 Ventricular premature depolarization: Secondary | ICD-10-CM | POA: Diagnosis not present

## 2024-11-20 DIAGNOSIS — I25118 Atherosclerotic heart disease of native coronary artery with other forms of angina pectoris: Secondary | ICD-10-CM | POA: Diagnosis not present

## 2024-11-20 DIAGNOSIS — I679 Cerebrovascular disease, unspecified: Secondary | ICD-10-CM

## 2024-11-20 DIAGNOSIS — I5020 Unspecified systolic (congestive) heart failure: Secondary | ICD-10-CM | POA: Diagnosis not present

## 2024-11-20 DIAGNOSIS — I10 Essential (primary) hypertension: Secondary | ICD-10-CM | POA: Diagnosis not present

## 2024-11-20 DIAGNOSIS — I255 Ischemic cardiomyopathy: Secondary | ICD-10-CM | POA: Diagnosis not present

## 2024-11-20 DIAGNOSIS — Z01810 Encounter for preprocedural cardiovascular examination: Secondary | ICD-10-CM | POA: Diagnosis not present

## 2024-11-20 NOTE — Telephone Encounter (Signed)
 THIS ID A DUPLICATE ; PLEASE REFER TO THE OTHER REQUEST 11/18/24 CLEARANCE 11/24/24.

## 2024-11-20 NOTE — Telephone Encounter (Signed)
 This is from office note today, 11/20/2024 with Tylene Lunch, NP    Preoperative cardiovascular examination    [name] perioperative risk of a major cardiac event is 11% according to the Revised Cardiac Risk Index (RCRI).  Therefore, he is at high risk for perioperative complications.   His functional capacity is good at 8.23 METs according to the Duke Activity Status Index (DASI). Recommendations: According to ACC/AHA guidelines, no further cardiovascular testing needed.  The patient may proceed to surgery at acceptable risk.   Antiplatelet and/or Anticoagulation Recommendations: Clopidogrel (Plavix) can be held for 5-7 days prior to his surgery and resumed as soon as possible post op.       Dispo: Patient to return to clinic to see MD/APP in 11 to 12 months or sooner if needed for further evaluation   Signed, SHERI HAMMOCK, NP

## 2024-11-20 NOTE — Patient Instructions (Signed)
 Medication Instructions:  Your physician recommends that you continue on your current medications as directed. Please refer to the Current Medication list given to you today.   *If you need a refill on your cardiac medications before your next appointment, please call your pharmacy*  Lab Work: No labs ordered today  If you have labs (blood work) drawn today and your tests are completely normal, you will receive your results only by: MyChart Message (if you have MyChart) OR A paper copy in the mail If you have any lab test that is abnormal or we need to change your treatment, we will call you to review the results.  Testing/Procedures: No test ordered today   Follow-Up: At [facility], you and your health needs are our priority.  As part of our continuing mission to provide you with exceptional heart care, our providers are all part of one team.  This team includes your primary Cardiologist (physician) and Advanced Practice Providers or APPs (Physician Assistants and Nurse Practitioners) who all work together to provide you with the care you need, when you need it.  Your next appointment:   12 month(s)  Provider:   You may see Timothy Gollan, MD or one of the following Advanced Practice Providers on your designated Care Team:   Tylene Lunch, NP   We recommend signing up for the patient portal called MyChart.  Sign up information is provided on this After Visit Summary.  MyChart is used to connect with patients for Virtual Visits (Telemedicine).  Patients are able to view lab/test results, encounter notes, upcoming appointments, etc.  Non-urgent messages can be sent to your provider as well.   To learn more about what you can do with MyChart, go to [email or web address].

## 2024-11-20 NOTE — Progress Notes (Signed)
 " Cardiology Office Note   Date:  11/20/2024  ID:  Ricardo Madero., DOB 03-26-1946, MRN [id number] PCP: Cleotilde Oneil FALCON, MD  [facility] Providers Cardiologist:  Evalene Lunger, MD Cardiology APP:  Gerard Frederick, NP     History of Present Illness Ricardo Loftus. is a 79 y.o. male with a past medical history of coronary artery disease status post PCI to the RCA (11/2013), bradycardia, hypertension, hyperlipidemia, DVT (2012), history of COVID infection, osteoarthritis, PVD, elevated PSA, lumbar radiculopathy status post spinal injections, who is here today for preoperative cardiovascular examination.   Coronary artery disease status post PCI of the RCA in 07/2014 after suffering an ST elevated myocardial infarction. In June 2021 he underwent repeat cardiac testing which revealed an echocardiogram with mild LV systolic dysfunction mild LVH and estimated EF of 45%. Myoview was unremarkable and revealed no evidence of stress-induced myocardial ischemia.   He was evaluated by Dr. Gollan 01/15/2022.  At that time he was doing well denied any chest pain concern for angina.  He was continued on his current medication regimen without further testing that was needed.  He was seen evaluated in clinic 06/25/2023 stating been doing fairly well.  For the cardiac standpoint he had no concerns or complaints.  He continued to remain active with push mowing his yard and playing golf.  There were no medication changes that were made or further testing that was ordered at that time.  He was last seen in clinic 04/17/2024.  At that time he had had several months he had been experiencing mitten lightheadedness and chest discomfort occurring almost exclusively after eating breakfast and persisting for several hours before resolving spontaneously.  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  He was subsequently placed on Plavix and statin therapy which he tolerated.  Unfortunately he had not continued to have lightheaded spells in the morning his losartan  dose was recently reduced from 100 to 50 mg and he was advised to take it in the evening.  He presented to the emergency department on April 13, 2024 after experiencing similar episode of chest tightness and lightheadedness occurring at home.  He was found to be hypertensive at 221/104, ECG showed sinus rhythm with first-degree AV block and PVCs.  Troponin was normal x 1 but the patient left prior to second.  Due to ongoing symptoms patient scheduled today's appointment.  He he was symptom-free on his appointment.  With PVCs noted on his ECG he was scheduled for 7-day ZIO monitor and a cardiac PET stress then will consider repeating echo.    He returns to clinic today stating that he has been doing well from a cardiac perspective.  Denies any exertional chest pain, chest tightness, lightheadedness, dizziness, or peripheral edema.  States that he has upcoming surgery on his left shoulder for torn rotator cuff.  Previously had stress testing that was considered low risk.  Since that time he denies any reoccurrence of his symptoms.  States that he had noted a slight increase in his blood pressure and called his primary care provider who had just started amlodipine yesterday.  He states that blood pressure slightly up today as he has been out and about preparing for the storm this morning and has  not had the amlodipine as of yet.  States that he had to have follow-up by cardiology prior to having his procedure completed but also advised since he has already been told how long to hold his clopidogrel prior to surgery.  He states that he has been compliant with his current medication regimen without any undue  side effects.  Denies any recent hospitalizations or visits to the emergency department.  ROS: 10 point review of systems has been reviewed and considered negative exception was listed in the HPI  Studies Reviewed     2d echo ([facility]) 03/2020 INTERPRETATION  MILD LV SYSTOLIC DYSFUNCTION (See above)   WITH MILD LVH  NORMAL RIGHT VENTRICULAR SYSTOLIC FUNCTION  MILD VALVULAR REGURGITATION (See above)  NO VALVULAR STENOSIS  MILD MR, TR  EF 45%   Cardiac Studies & Procedures   ______________________________________________________________________________________________   STRESS TESTS  NM PET CT CARDIAC PERFUSION MULTI W/ABSOLUTE BLOODFLOW 06/11/2024  Narrative   The study is normal. The study is low risk.   LV perfusion is normal. There is no evidence of ischemia. There is no evidence of infarction.   Rest left ventricular function is normal. Rest EF: 56%. Stress left ventricular function is normal. Stress EF: 63%. End diastolic cavity size is normal. End systolic cavity size is normal.   Myocardial blood flow was computed to be 0.49ml/g/min at rest and 2.19ml/g/min at stress. Global myocardial blood flow reserve was 2.96 and was normal (though can be unrelaible in setting of prior PCI)   Coronary calcium  assessment not performed due to prior revascularization.   Electronically signed by Lonni Nanas, MD  CLINICAL DATA:  This over-read does not include interpretation of cardiac or coronary anatomy or pathology. No interpretation the PET data set. The cardiac PET-CT interpretation by the cardiologist is attached.  COMPARISON:  None Available.  FINDINGS: Limited view of the lung parenchyma demonstrates no suspicious nodularity. Airways are normal.  Limited view of the mediastinum demonstrates no adenopathy. Esophagus normal.  Limited view of the upper abdomen unremarkable.  Limited view of the skeleton and chest wall is unremarkable.  IMPRESSION: No significant  extracardiac findings.   Electronically Signed By: Jackquline Boxer M.D. On: 06/11/2024 10:35   ECHOCARDIOGRAM  ECHOCARDIOGRAM COMPLETE 03/10/2018  Narrative *[facility]* [address] [place], [place] [address] [phone]  ------------------------------------------------------------------- Transthoracic Echocardiography  Patient:    Jaxsun, Ciampi MR #:       [id number] Study Date: 03/10/2018 Gender:     M Age:        71 Height:     188 cm Weight:     107.7 kg BSA:        2.4 m^2 Pt. Status: Room:  ATTENDING    Auston Reyes JONETTA TISA Auston Reyes JONETTA REFERRING    Auston Reyes D SONOGRAPHER  Christopher Furnace RDCS PERFORMING   [facility]  cc:  ------------------------------------------------------------------- LV EF: 55% -   60%  ------------------------------------------------------------------- Indications:      Syncope 780.2.  ------------------------------------------------------------------- History:   PMH:  Anginal pain, cancer, edema.  Coronary artery disease.  PMH:   Myocardial infarction.  Risk factors: Hypertension.  ------------------------------------------------------------------- Study Conclusions  - Left ventricle: Systolic function was normal. The estimated ejection fraction was in the range of 55% to 60%. - Aortic valve: Valve area (Vmax): 2.48 cm^2.  ------------------------------------------------------------------- Study data:   Study status:  Routine.  Procedure:  The patient reported no pain pre or post test. Transthoracic echocardiography.  Image quality was adequate.          Transthoracic echocardiography.  M-mode, complete 2D, spectral Doppler, and color Doppler.  Birthdate:  Patient birthdate: 1946/01/05.  Age:  Patient is 79 yr old.  Sex:  Gender: male.    BMI: 30.5 kg/m^2.  Blood pressure:     144/86  Patient status:  Inpatient.  Study date: Study date: 03/10/2018. Study time: 08:32 AM.   Location:  Bedside.  -------------------------------------------------------------------  ------------------------------------------------------------------- Left ventricle:  Systolic function was normal. The estimated ejection fraction was in the range of 55% to 60%.  ------------------------------------------------------------------- Aortic valve:   Doppler:  There was no significant regurgitation. Peak velocity ratio of LVOT to aortic valve: 0.6. Valve area (Vmax): 2.48 cm^2. Indexed valve area (Vmax): 1.04 cm^2/m^2. Peak gradient (S): 6 mm Hg.  ------------------------------------------------------------------- Mitral valve:   Doppler:  There was no significant regurgitation.  ------------------------------------------------------------------- Left atrium:  The atrium was at the upper limits of normal in size.  ------------------------------------------------------------------- Right ventricle:  The cavity size was normal. Systolic function was normal.  ------------------------------------------------------------------- Tricuspid valve:   Doppler:  There was no significant regurgitation.  ------------------------------------------------------------------- Right atrium:  The atrium was normal in size.  ------------------------------------------------------------------- Pericardium:  There was no pericardial effusion.  ------------------------------------------------------------------- Measurements  Left ventricle                           Value          Reference LV ID, ED, PLAX chordal                  49.6  mm       43 - 52 LV ID, ES, PLAX chordal                  30.1  mm       23 - 38 LV fx shortening, PLAX chordal           39    %        >=29 LV PW thickness, ED                      12.8  mm       ---------- IVS/LV PW ratio, ED                      0.81           <=1.3 LV e&', lateral                           7.51  cm/s     ---------- LV E/e&', lateral                          8.27           ---------- LV e&', medial                            6.64  cm/s     ---------- LV E/e&', medial                          9.35           ---------- LV e&', average  7.08  cm/s     ---------- LV E/e&', average                         8.78           ----------  Ventricular septum                       Value          Reference IVS thickness, ED                        10.4  mm       ----------  LVOT                                     Value          Reference LVOT ID, S                               23    mm       ---------- LVOT area                                4.15  cm^2     ---------- LVOT peak velocity, S                    71.8  cm/s     ----------  Aortic valve                             Value          Reference Aortic valve peak velocity, S            120   cm/s     ---------- Aortic peak gradient, S                  6     mm Hg    ---------- Velocity ratio, peak, LVOT/AV            0.6            ---------- Aortic valve area, peak velocity         2.48  cm^2     ---------- Aortic valve area/bsa, peak              1.04  cm^2/m^2 ---------- velocity  Aorta                                    Value          Reference Aortic root ID, ED                       36    mm       ----------  Left atrium                              Value          Reference LA ID, A-P, ES  48    mm       ---------- LA ID/bsa, A-P                           2     cm/m^2   <=2.2 LA volume, S                             121   ml       ---------- LA volume/bsa, S                         50.5  ml/m^2   ---------- LA volume, ES, 1-p A4C                   86.5  ml       ---------- LA volume/bsa, ES, 1-p A4C               36.1  ml/m^2   ---------- LA volume, ES, 1-p A2C                   157   ml       ---------- LA volume/bsa, ES, 1-p A2C               65.5  ml/m^2   ----------  Mitral valve                             Value           Reference Mitral E-wave peak velocity              62.1  cm/s     ---------- Mitral A-wave peak velocity              91.7  cm/s     ---------- Mitral deceleration time                 211   ms       150 - 230 Mitral E/A ratio, peak                   0.7            ----------  Right atrium                             Value          Reference RA ID, S-I, ES, A4C              (H)     53.9  mm       34 - 49 RA area, ES, A4C                         15.6  cm^2     8.3 - 19.5 RA volume, ES, A/L                       36.8  ml       ---------- RA volume/bsa, ES, A/L                   15.4  ml/m^2   ----------  Right ventricle  Value          Reference RV ID, ED, PLAX                          36.9  mm       19 - 38  Pulmonic valve                           Value          Reference Pulmonic valve peak velocity, S          50.4  cm/s     ----------  Legend: (L)  and  (H)  mark values outside specified reference range.  ------------------------------------------------------------------- Prepared and Electronically Authenticated by  Vinie Jude, MD 2019-05-13T11:15:32          ______________________________________________________________________________________________      Risk Assessment/Calculations   HYPERTENSION CONTROL Vitals:   11/20/24 1005 11/20/24 1008  BP: (!) 142/90 (!) 140/86    The patient's blood pressure is elevated above target today.  In order to address the patient's elevated BP: Blood pressure will be monitored at home to determine if medication changes need to be made. (started on amlodipine by PCP yesterday 11/19/2024)          Physical Exam VS:  BP (!) 140/86 (BP Location: Right Arm, Patient Position: Sitting, Cuff Size: Large)   Pulse 72   Ht 6' 1 (1.854 m)   Wt 236 lb (107 kg)   SpO2 97%   BMI 31.14 kg/m        Wt Readings from Last 3 Encounters:  11/20/24 236 lb (107 kg)  10/21/24 240 lb (108.9 kg)  04/18/24 240 lb  (108.9 kg)    GEN: Well nourished, well developed in no acute distress NECK: No JVD; No carotid bruits CARDIAC: RRR, no murmurs, rubs, gallops RESPIRATORY:  Clear to auscultation without rales, wheezing or rhonchi  ABDOMEN: Soft, non-tender, non-distended EXTREMITIES:  No edema; No deformity   ASSESSMENT AND PLAN Coronary artery disease of the native coronary artery with PCI to the RCA in 07/2014 after ST elevated myocardial infarction.  He denies any angina or symptoms of decompensation.  EKG completed in preop revealed sinus rhythm with first-degree AV block with a rate of 61 with LVH no acute ischemic changes noted.  He is continued on clopidogrel 75 mg daily and rosuvastatin  5 mg daily.  No further ischemic evaluation needed at this time.  PVCs that have resolved he was scheduled for ZIO XT monitor unfortunately he returned to monitor on usage.  With resolution of symptoms and no PVCs noted on preoperative EKG repeat monitor deferred at this time.  Primary hypertension with a blood pressure today 142/90 and 140/86.  Patient states unfortunately he has not had all of his medications today.  He stated he had noticed a slight uptick in his blood pressure at home and called his PCP and has just recently been started on amlodipine which he has yet to take.  He is continued on losartan  50 mg daily.  He has been encouraged to continue to monitor his pressures 1 to 2 hours postmedication administration at home as well.  Mixed hyperlipidemia with an LDL 66 which remains a goal of less than 70.  He is continued on rosuvastatin  5 mg daily.  Ischemic cardiomyopathy/HFimpEF with normalization of LV function in May 2019 and then repeat echocardiogram completed in 2021 revealed mild MR and TR  mild LVH and an LVEF of 45%.  Continues to remain euvolemic on exam.  No symptoms of decompensation.  He is continued on losartan .  Does not require diuretic at this time.  No further changes in medication needed.  Wrist  EF of 56% and stress EF of 63% on cardiac PET stress.  Cerebrovascular disease MRI and CT/CTA of the head and neck revealing.  Continued on Plavix and statin therapy.  Preoperative cardiovascular examination    Ricardo Winters perioperative risk of a major cardiac event is 11% according to the Revised Cardiac Risk Index (RCRI).  Therefore, he is at high risk for perioperative complications.   His functional capacity is good at 8.23 METs according to the [facility] Activity Status Index (DASI). Recommendations: According to ACC/AHA guidelines, no further cardiovascular testing needed.  The patient may proceed to surgery at acceptable risk.   Antiplatelet and/or Anticoagulation Recommendations: Clopidogrel (Plavix) can be held for 5-7 days prior to his surgery and resumed as soon as possible post op.       Dispo: Patient to return to clinic to see MD/APP in 11 to 12 months or sooner if needed for further evaluation  Signed, Jewel Mcafee, NP   "

## 2024-11-20 NOTE — Progress Notes (Signed)
 I will fax notes from Tylene Lunch, NP providing clearance.

## 2024-11-23 ENCOUNTER — Ambulatory Visit: Payer: Self-pay | Admitting: Dermatology

## 2024-11-23 ENCOUNTER — Ambulatory Visit: Admission: RE | Admit: 2024-11-23 | Source: Home / Self Care | Admitting: Orthopedic Surgery

## 2024-11-23 DIAGNOSIS — I739 Peripheral vascular disease, unspecified: Secondary | ICD-10-CM

## 2024-11-23 HISTORY — DX: Benign prostatic hyperplasia without lower urinary tract symptoms: N40.0

## 2024-11-23 HISTORY — DX: Long term (current) use of antithrombotics/antiplatelets: Z79.02

## 2024-11-23 HISTORY — DX: Unspecified malignant neoplasm of skin of nose: C44.301

## 2024-11-23 HISTORY — DX: Bradycardia, unspecified: R00.1

## 2024-11-23 HISTORY — DX: Peripheral vascular disease, unspecified: I73.9

## 2024-11-23 HISTORY — DX: Acute embolism and thrombosis of unspecified deep veins of unspecified lower extremity: I82.409

## 2024-11-23 HISTORY — DX: Cerebral infarction, unspecified: I63.9

## 2024-11-23 HISTORY — DX: Other intervertebral disc degeneration, lumbar region without mention of lumbar back pain or lower extremity pain: M51.369

## 2024-11-23 HISTORY — DX: Other cerebrovascular disease: I67.89

## 2024-11-23 HISTORY — DX: Atherosclerosis of aorta: I70.0

## 2024-11-23 HISTORY — DX: Hyperlipidemia, unspecified: E78.5

## 2024-11-24 ENCOUNTER — Encounter: Payer: Self-pay | Admitting: Dermatology

## 2024-11-24 NOTE — Telephone Encounter (Signed)
 Discussed pathology results with patient. Surgical site is doing well. Suture removal appointment scheduled for 11/30/2024.

## 2024-11-30 ENCOUNTER — Encounter: Admitting: Dermatology

## 2024-12-01 ENCOUNTER — Encounter: Admitting: Dermatology

## 2024-12-07 ENCOUNTER — Ambulatory Visit: Admitting: Cardiovascular Disease

## 2024-12-14 ENCOUNTER — Encounter: Payer: Self-pay | Admitting: Urgent Care

## 2024-12-14 ENCOUNTER — Encounter: Admission: RE | Payer: Self-pay | Source: Home / Self Care

## 2024-12-14 ENCOUNTER — Ambulatory Visit: Admit: 2024-12-14 | Admitting: Orthopedic Surgery

## 2025-02-01 ENCOUNTER — Ambulatory Visit: Admitting: Dermatology
